# Patient Record
Sex: Female | Born: 1945
Health system: Southern US, Community
[De-identification: ages and names within clinical notes are randomized; demographics above are authoritative.]

## PROBLEM LIST (undated history)

## (undated) ENCOUNTER — Emergency Department (HOSPITAL_COMMUNITY): Admission: EM | Disposition: A | Discharge status: Still In Hospital | Payer: Medicare HMO | Source: Home / Self Care

## (undated) DIAGNOSIS — E785 Hyperlipidemia, unspecified: Secondary | ICD-10-CM

## (undated) DIAGNOSIS — J40 Bronchitis, not specified as acute or chronic: Secondary | ICD-10-CM

## (undated) DIAGNOSIS — E669 Obesity, unspecified: Secondary | ICD-10-CM

## (undated) DIAGNOSIS — G8929 Other chronic pain: Secondary | ICD-10-CM

## (undated) DIAGNOSIS — I10 Essential (primary) hypertension: Secondary | ICD-10-CM

## (undated) DIAGNOSIS — J302 Other seasonal allergic rhinitis: Secondary | ICD-10-CM

## (undated) DIAGNOSIS — M549 Dorsalgia, unspecified: Secondary | ICD-10-CM

## (undated) DIAGNOSIS — M1990 Unspecified osteoarthritis, unspecified site: Secondary | ICD-10-CM

## (undated) DIAGNOSIS — R7302 Impaired glucose tolerance (oral): Secondary | ICD-10-CM

## (undated) HISTORY — DX: Other chronic pain: G89.29

## (undated) HISTORY — DX: Essential (primary) hypertension: I10

## (undated) HISTORY — DX: Unspecified osteoarthritis, unspecified site: M19.90

## (undated) HISTORY — DX: Obesity, unspecified: E66.9

## (undated) HISTORY — DX: Hyperlipidemia, unspecified: E78.5

## (undated) HISTORY — PX: TOTAL ABDOMINAL HYSTERECTOMY: SHX209

## (undated) HISTORY — DX: Impaired glucose tolerance (oral): R73.02

## (undated) HISTORY — PX: KNEE ARTHROSCOPY: SUR90

## (undated) HISTORY — DX: Other chronic pain: M54.9

## (undated) HISTORY — PX: JOINT REPLACEMENT: SHX530

---

## 2000-11-30 ENCOUNTER — Emergency Department (HOSPITAL_COMMUNITY): Admission: EM | Admit: 2000-11-30 | Discharge: 2000-11-30 | Payer: Self-pay | Admitting: Internal Medicine

## 2000-11-30 ENCOUNTER — Encounter: Payer: Self-pay | Admitting: Internal Medicine

## 2001-07-26 ENCOUNTER — Encounter: Payer: Self-pay | Admitting: Emergency Medicine

## 2001-07-26 ENCOUNTER — Emergency Department (HOSPITAL_COMMUNITY): Admission: EM | Admit: 2001-07-26 | Discharge: 2001-07-26 | Payer: Self-pay | Admitting: Emergency Medicine

## 2002-07-11 ENCOUNTER — Encounter: Payer: Self-pay | Admitting: Emergency Medicine

## 2002-07-11 ENCOUNTER — Emergency Department (HOSPITAL_COMMUNITY): Admission: EM | Admit: 2002-07-11 | Discharge: 2002-07-11 | Payer: Self-pay | Admitting: Emergency Medicine

## 2002-10-12 ENCOUNTER — Emergency Department (HOSPITAL_COMMUNITY): Admission: EM | Admit: 2002-10-12 | Discharge: 2002-10-12 | Payer: Self-pay | Admitting: Emergency Medicine

## 2002-10-13 ENCOUNTER — Emergency Department (HOSPITAL_COMMUNITY): Admission: EM | Admit: 2002-10-13 | Discharge: 2002-10-13 | Payer: Self-pay | Admitting: Emergency Medicine

## 2002-11-02 ENCOUNTER — Encounter (HOSPITAL_COMMUNITY): Admission: RE | Admit: 2002-11-02 | Discharge: 2002-12-02 | Payer: Self-pay | Admitting: Orthopaedic Surgery

## 2003-11-29 ENCOUNTER — Emergency Department (HOSPITAL_COMMUNITY): Admission: EM | Admit: 2003-11-29 | Discharge: 2003-11-29 | Payer: Self-pay | Admitting: *Deleted

## 2003-12-21 ENCOUNTER — Encounter: Payer: Self-pay | Admitting: Orthopedic Surgery

## 2005-04-25 ENCOUNTER — Emergency Department (HOSPITAL_COMMUNITY): Admission: EM | Admit: 2005-04-25 | Discharge: 2005-04-25 | Payer: Self-pay | Admitting: Emergency Medicine

## 2005-04-28 ENCOUNTER — Emergency Department (HOSPITAL_COMMUNITY): Admission: EM | Admit: 2005-04-28 | Discharge: 2005-04-28 | Payer: Self-pay | Admitting: Emergency Medicine

## 2005-05-02 ENCOUNTER — Ambulatory Visit: Payer: Self-pay | Admitting: Orthopedic Surgery

## 2005-11-08 ENCOUNTER — Ambulatory Visit (HOSPITAL_COMMUNITY): Admission: RE | Admit: 2005-11-08 | Discharge: 2005-11-08 | Payer: Self-pay | Admitting: Internal Medicine

## 2006-01-24 ENCOUNTER — Emergency Department (HOSPITAL_COMMUNITY): Admission: EM | Admit: 2006-01-24 | Discharge: 2006-01-24 | Payer: Self-pay | Admitting: Emergency Medicine

## 2006-08-02 ENCOUNTER — Emergency Department (HOSPITAL_COMMUNITY): Admission: EM | Admit: 2006-08-02 | Discharge: 2006-08-02 | Payer: Self-pay | Admitting: Emergency Medicine

## 2006-08-04 ENCOUNTER — Emergency Department (HOSPITAL_COMMUNITY): Admission: EM | Admit: 2006-08-04 | Discharge: 2006-08-04 | Payer: Self-pay | Admitting: Emergency Medicine

## 2007-09-29 ENCOUNTER — Ambulatory Visit: Payer: Self-pay | Admitting: Family Medicine

## 2007-10-07 ENCOUNTER — Encounter: Payer: Self-pay | Admitting: Family Medicine

## 2007-10-07 DIAGNOSIS — I10 Essential (primary) hypertension: Secondary | ICD-10-CM | POA: Insufficient documentation

## 2007-10-07 DIAGNOSIS — N390 Urinary tract infection, site not specified: Secondary | ICD-10-CM | POA: Insufficient documentation

## 2007-10-07 LAB — CONVERTED CEMR LAB
BUN: 14 mg/dL (ref 6–23)
Basophils Absolute: 0 10*3/uL (ref 0.0–0.1)
Basophils Relative: 1 % (ref 0–1)
Creatinine, Ser: 0.62 mg/dL (ref 0.40–1.20)
Glucose, Bld: 100 mg/dL — ABNORMAL HIGH (ref 70–99)
HCT: 39 % (ref 36.0–46.0)
HDL: 59 mg/dL (ref >39)
Hemoglobin: 12.2 g/dL (ref 12.0–15.0)
Lymphocytes Relative: 38 % (ref 12–46)
MCHC: 31.3 g/dL (ref 30.0–36.0)
MCV: 87.2 fL (ref 78.0–100.0)
Monocytes Relative: 10 % (ref 3–12)
Neutrophils Relative %: 46 % (ref 43–77)
Platelets: 188 10*3/uL (ref 150–400)
Potassium: 3.6 meq/L (ref 3.5–5.3)
RDW: 14.6 % (ref 11.5–15.5)
TSH: 1.139 microintl units/mL (ref 0.350–5.50)
Vit D, 1,25-Dihydroxy: 4 — ABNORMAL LOW (ref 30–89)
WBC: 5.8 10*3/uL (ref 4.0–10.5)

## 2007-10-13 ENCOUNTER — Encounter: Payer: Self-pay | Admitting: Family Medicine

## 2007-10-13 LAB — CONVERTED CEMR LAB
ALT: 11 units/L (ref 0–35)
AST: 14 units/L (ref 0–37)
Albumin: 3.9 g/dL (ref 3.5–5.2)
Bilirubin, Direct: 0.1 mg/dL (ref 0.0–0.3)
Total Protein: 8 g/dL (ref 6.0–8.3)

## 2007-12-02 ENCOUNTER — Ambulatory Visit: Payer: Self-pay | Admitting: Family Medicine

## 2007-12-09 ENCOUNTER — Ambulatory Visit (HOSPITAL_COMMUNITY): Admission: RE | Admit: 2007-12-09 | Discharge: 2007-12-09 | Payer: Self-pay | Admitting: Family Medicine

## 2008-03-29 ENCOUNTER — Ambulatory Visit: Payer: Self-pay | Admitting: Family Medicine

## 2008-03-29 DIAGNOSIS — E662 Morbid (severe) obesity with alveolar hypoventilation: Secondary | ICD-10-CM | POA: Insufficient documentation

## 2008-03-29 DIAGNOSIS — E785 Hyperlipidemia, unspecified: Secondary | ICD-10-CM | POA: Insufficient documentation

## 2008-03-29 DIAGNOSIS — M1712 Unilateral primary osteoarthritis, left knee: Secondary | ICD-10-CM | POA: Insufficient documentation

## 2008-03-29 LAB — CONVERTED CEMR LAB
Bilirubin Urine: NEGATIVE
Glucose, Urine, Semiquant: NEGATIVE
Protein, U semiquant: NEGATIVE
Specific Gravity, Urine: 1.025
pH: 5.5

## 2008-03-31 ENCOUNTER — Encounter: Payer: Self-pay | Admitting: Family Medicine

## 2008-05-04 ENCOUNTER — Telehealth: Payer: Self-pay | Admitting: Family Medicine

## 2008-05-26 ENCOUNTER — Encounter: Payer: Self-pay | Admitting: Family Medicine

## 2008-06-13 ENCOUNTER — Ambulatory Visit: Payer: Self-pay | Admitting: Orthopedic Surgery

## 2008-08-01 ENCOUNTER — Ambulatory Visit: Payer: Self-pay | Admitting: Family Medicine

## 2008-09-05 ENCOUNTER — Encounter: Payer: Self-pay | Admitting: Family Medicine

## 2008-09-05 LAB — CONVERTED CEMR LAB
Albumin: 3.6 g/dL (ref 3.5–5.2)
BUN: 14 mg/dL (ref 6–23)
Calcium: 8.3 mg/dL — ABNORMAL LOW (ref 8.4–10.5)
Chloride: 98 meq/L (ref 96–112)
Creatinine, Ser: 0.58 mg/dL (ref 0.40–1.20)
Glucose, Bld: 98 mg/dL (ref 70–99)
LDL Cholesterol: 78 mg/dL (ref 0–99)
Potassium: 3.5 meq/L (ref 3.5–5.3)
Sodium: 143 meq/L (ref 135–145)
Total Bilirubin: 0.3 mg/dL (ref 0.3–1.2)

## 2008-09-20 ENCOUNTER — Ambulatory Visit: Payer: Self-pay | Admitting: Family Medicine

## 2008-09-20 DIAGNOSIS — IMO0002 Reserved for concepts with insufficient information to code with codable children: Secondary | ICD-10-CM | POA: Insufficient documentation

## 2008-09-27 ENCOUNTER — Ambulatory Visit (HOSPITAL_COMMUNITY): Admission: RE | Admit: 2008-09-27 | Discharge: 2008-09-27 | Payer: Self-pay | Admitting: Family Medicine

## 2008-09-27 ENCOUNTER — Encounter: Payer: Self-pay | Admitting: Orthopedic Surgery

## 2008-11-17 ENCOUNTER — Encounter: Admission: RE | Admit: 2008-11-17 | Discharge: 2008-11-17 | Payer: Self-pay | Admitting: Family Medicine

## 2008-12-01 ENCOUNTER — Ambulatory Visit: Payer: Self-pay | Admitting: Family Medicine

## 2008-12-02 LAB — CONVERTED CEMR LAB
BUN: 14 mg/dL (ref 6–23)
CO2: 27 meq/L (ref 19–32)
Creatinine, Ser: 0.61 mg/dL (ref 0.40–1.20)
Potassium: 4.3 meq/L (ref 3.5–5.3)

## 2008-12-14 ENCOUNTER — Telehealth: Payer: Self-pay | Admitting: Family Medicine

## 2008-12-15 ENCOUNTER — Ambulatory Visit: Payer: Self-pay | Admitting: Family Medicine

## 2008-12-29 ENCOUNTER — Encounter: Admission: RE | Admit: 2008-12-29 | Discharge: 2008-12-29 | Payer: Self-pay | Admitting: Family Medicine

## 2008-12-29 ENCOUNTER — Encounter: Payer: Self-pay | Admitting: Orthopedic Surgery

## 2008-12-30 ENCOUNTER — Ambulatory Visit (HOSPITAL_COMMUNITY): Admission: RE | Admit: 2008-12-30 | Discharge: 2008-12-30 | Payer: Self-pay | Admitting: Cardiology

## 2008-12-30 ENCOUNTER — Ambulatory Visit: Payer: Self-pay | Admitting: Cardiology

## 2008-12-30 ENCOUNTER — Encounter: Payer: Self-pay | Admitting: Cardiology

## 2009-01-03 ENCOUNTER — Encounter: Payer: Self-pay | Admitting: Cardiology

## 2009-03-16 ENCOUNTER — Telehealth: Payer: Self-pay | Admitting: Family Medicine

## 2009-05-27 HISTORY — PX: TOTAL KNEE ARTHROPLASTY: SHX125

## 2009-06-07 ENCOUNTER — Ambulatory Visit: Payer: Self-pay | Admitting: Family Medicine

## 2009-06-08 ENCOUNTER — Encounter: Payer: Self-pay | Admitting: Family Medicine

## 2009-06-11 ENCOUNTER — Telehealth: Payer: Self-pay | Admitting: Family Medicine

## 2009-06-12 ENCOUNTER — Telehealth: Payer: Self-pay | Admitting: Family Medicine

## 2009-06-12 ENCOUNTER — Encounter: Payer: Self-pay | Admitting: Family Medicine

## 2009-06-14 LAB — CONVERTED CEMR LAB
AST: 11 units/L (ref 0–37)
Alkaline Phosphatase: 77 units/L (ref 39–117)
Basophils Relative: 1 % (ref 0–1)
Bilirubin, Direct: 0.1 mg/dL (ref 0.0–0.3)
CO2: 26 meq/L (ref 19–32)
Calcium: 9.1 mg/dL (ref 8.4–10.5)
Chloride: 100 meq/L (ref 96–112)
Creatinine, Ser: 0.57 mg/dL (ref 0.40–1.20)
Eosinophils Relative: 4 % (ref 0–5)
HCT: 38.4 % (ref 36.0–46.0)
Hemoglobin: 12 g/dL (ref 12.0–15.0)
Indirect Bilirubin: 0.3 mg/dL (ref 0.0–0.9)
Lymphs Abs: 1.9 10*3/uL (ref 0.7–4.0)
MCHC: 31.3 g/dL (ref 30.0–36.0)
MCV: 86.7 fL (ref 78.0–100.0)
Monocytes Absolute: 0.7 10*3/uL (ref 0.1–1.0)
Monocytes Relative: 11 % (ref 3–12)
RBC: 4.43 M/uL (ref 3.87–5.11)
Sodium: 141 meq/L (ref 135–145)
Total Bilirubin: 0.4 mg/dL (ref 0.3–1.2)
Total Protein: 7.7 g/dL (ref 6.0–8.3)
VLDL: 14 mg/dL (ref 0–40)
WBC: 5.8 10*3/uL (ref 4.0–10.5)

## 2009-06-19 ENCOUNTER — Ambulatory Visit: Payer: Self-pay | Admitting: Orthopedic Surgery

## 2009-06-20 ENCOUNTER — Encounter (INDEPENDENT_AMBULATORY_CARE_PROVIDER_SITE_OTHER): Payer: Self-pay | Admitting: *Deleted

## 2009-06-26 ENCOUNTER — Encounter (INDEPENDENT_AMBULATORY_CARE_PROVIDER_SITE_OTHER): Payer: Self-pay | Admitting: *Deleted

## 2009-06-26 ENCOUNTER — Ambulatory Visit (HOSPITAL_COMMUNITY): Admission: RE | Admit: 2009-06-26 | Discharge: 2009-06-26 | Payer: Self-pay | Admitting: Family Medicine

## 2009-06-30 ENCOUNTER — Encounter (INDEPENDENT_AMBULATORY_CARE_PROVIDER_SITE_OTHER): Payer: Self-pay | Admitting: *Deleted

## 2009-07-07 ENCOUNTER — Encounter: Payer: Self-pay | Admitting: Orthopedic Surgery

## 2009-07-11 ENCOUNTER — Inpatient Hospital Stay (HOSPITAL_COMMUNITY): Admission: RE | Admit: 2009-07-11 | Discharge: 2009-07-14 | Payer: Self-pay | Admitting: Orthopedic Surgery

## 2009-07-11 ENCOUNTER — Ambulatory Visit: Payer: Self-pay | Admitting: Orthopedic Surgery

## 2009-07-14 ENCOUNTER — Inpatient Hospital Stay: Admission: AD | Admit: 2009-07-14 | Discharge: 2009-08-12 | Payer: Self-pay | Admitting: Internal Medicine

## 2009-07-20 ENCOUNTER — Encounter: Payer: Self-pay | Admitting: Orthopedic Surgery

## 2009-07-25 ENCOUNTER — Ambulatory Visit: Payer: Self-pay | Admitting: Orthopedic Surgery

## 2009-07-28 ENCOUNTER — Ambulatory Visit (HOSPITAL_COMMUNITY): Admission: RE | Admit: 2009-07-28 | Discharge: 2009-07-28 | Payer: Self-pay | Admitting: Internal Medicine

## 2009-08-14 ENCOUNTER — Encounter: Payer: Self-pay | Admitting: Orthopedic Surgery

## 2009-08-23 ENCOUNTER — Ambulatory Visit: Payer: Self-pay | Admitting: Orthopedic Surgery

## 2009-08-24 ENCOUNTER — Encounter: Payer: Self-pay | Admitting: Orthopedic Surgery

## 2009-08-25 ENCOUNTER — Ambulatory Visit: Payer: Self-pay | Admitting: Family Medicine

## 2009-08-30 ENCOUNTER — Telehealth: Payer: Self-pay | Admitting: Family Medicine

## 2009-08-31 ENCOUNTER — Encounter (HOSPITAL_COMMUNITY): Admission: RE | Admit: 2009-08-31 | Discharge: 2009-09-30 | Payer: Self-pay | Admitting: Orthopedic Surgery

## 2009-08-31 ENCOUNTER — Encounter: Payer: Self-pay | Admitting: Orthopedic Surgery

## 2009-09-07 ENCOUNTER — Encounter: Payer: Self-pay | Admitting: Orthopedic Surgery

## 2009-09-27 ENCOUNTER — Encounter: Payer: Self-pay | Admitting: Orthopedic Surgery

## 2009-10-02 ENCOUNTER — Encounter (HOSPITAL_COMMUNITY): Admission: RE | Admit: 2009-10-02 | Discharge: 2009-11-01 | Payer: Self-pay | Admitting: Orthopedic Surgery

## 2009-10-06 ENCOUNTER — Encounter: Payer: Self-pay | Admitting: Orthopedic Surgery

## 2009-10-09 ENCOUNTER — Ambulatory Visit: Payer: Self-pay | Admitting: Orthopedic Surgery

## 2009-10-12 ENCOUNTER — Encounter: Payer: Self-pay | Admitting: Orthopedic Surgery

## 2009-10-20 ENCOUNTER — Encounter: Payer: Self-pay | Admitting: Orthopedic Surgery

## 2009-10-25 ENCOUNTER — Encounter: Payer: Self-pay | Admitting: Orthopedic Surgery

## 2009-10-26 ENCOUNTER — Telehealth: Payer: Self-pay | Admitting: Family Medicine

## 2009-10-30 ENCOUNTER — Telehealth: Payer: Self-pay | Admitting: Family Medicine

## 2009-12-25 ENCOUNTER — Ambulatory Visit: Payer: Self-pay | Admitting: Family Medicine

## 2010-01-02 ENCOUNTER — Telehealth: Payer: Self-pay | Admitting: Family Medicine

## 2010-01-17 ENCOUNTER — Ambulatory Visit: Payer: Self-pay | Admitting: Orthopedic Surgery

## 2010-01-24 ENCOUNTER — Encounter: Payer: Self-pay | Admitting: Orthopedic Surgery

## 2010-02-20 ENCOUNTER — Encounter: Payer: Self-pay | Admitting: Orthopedic Surgery

## 2010-02-26 ENCOUNTER — Telehealth: Payer: Self-pay | Admitting: Orthopedic Surgery

## 2010-03-02 ENCOUNTER — Emergency Department (HOSPITAL_COMMUNITY): Admission: EM | Admit: 2010-03-02 | Discharge: 2010-03-02 | Payer: Self-pay | Admitting: Emergency Medicine

## 2010-03-13 ENCOUNTER — Ambulatory Visit: Payer: Self-pay | Admitting: Family Medicine

## 2010-03-15 ENCOUNTER — Telehealth (INDEPENDENT_AMBULATORY_CARE_PROVIDER_SITE_OTHER): Payer: Self-pay | Admitting: *Deleted

## 2010-03-19 ENCOUNTER — Encounter: Payer: Self-pay | Admitting: Orthopedic Surgery

## 2010-03-30 ENCOUNTER — Encounter: Payer: Self-pay | Admitting: Family Medicine

## 2010-04-06 ENCOUNTER — Encounter: Payer: Self-pay | Admitting: Orthopedic Surgery

## 2010-06-17 ENCOUNTER — Encounter: Payer: Self-pay | Admitting: Family Medicine

## 2010-06-18 ENCOUNTER — Encounter: Payer: Self-pay | Admitting: Family Medicine

## 2010-06-22 LAB — CONVERTED CEMR LAB
AST: 16 units/L (ref 0–37)
Albumin: 3.9 g/dL (ref 3.5–5.2)
Bilirubin, Direct: 0.1 mg/dL (ref 0.0–0.3)
Calcium: 9.5 mg/dL (ref 8.4–10.5)
Chloride: 100 meq/L (ref 96–112)
HDL: 57 mg/dL (ref >39)
LDL Cholesterol: 116 mg/dL — ABNORMAL HIGH (ref 0–99)
Potassium: 4 meq/L (ref 3.5–5.3)
Total Bilirubin: 0.3 mg/dL (ref 0.3–1.2)
Total CHOL/HDL Ratio: 3.3
Triglycerides: 66 mg/dL (ref <150)
VLDL: 13 mg/dL (ref 0–40)

## 2010-06-25 ENCOUNTER — Ambulatory Visit
Admission: RE | Admit: 2010-06-25 | Discharge: 2010-06-25 | Payer: Self-pay | Source: Home / Self Care | Attending: Family Medicine | Admitting: Family Medicine

## 2010-06-25 LAB — CONVERTED CEMR LAB
Glucose, Urine, Semiquant: NEGATIVE
Ketones, urine, test strip: NEGATIVE
Nitrite: NEGATIVE
Specific Gravity, Urine: 1.02
Urobilinogen, UA: 0.2

## 2010-06-26 ENCOUNTER — Encounter: Payer: Self-pay | Admitting: Family Medicine

## 2010-06-26 NOTE — Progress Notes (Signed)
Summary: call  Phone Note Call from Patient   Summary of Call: wants you to call her at [phone] or home [phone] Initial call taken by: Lind Guest,  June 12, 2009 8:41 AM  Follow-up for Phone Call        called pt back and told her when dr. Diamantina Providence office gave her appt and time to call and let me know.  Follow-up by: Rudene Anda,  June 12, 2009 9:20 AM

## 2010-06-26 NOTE — Letter (Signed)
Summary: Out of Work  [facility]  [address]   [place], [place] [address]   Phone: [phone]  Fax: [phone]    June 07, 2009   Employee:  Patricia Fuentes    To Whom It May Concern:   For Medical reasons, please excuse the above named employee from work for the following dates:  Start:   06/05/09  End:   until follow up with orthopedist  If you need additional information, please feel free to contact our office.         Sincerely,    Milus Mallick. Simpson,MD

## 2010-06-26 NOTE — Letter (Signed)
Summary: Work [facility]  [address]  [place], [place] [address]   Phone: [phone]  Fax: [phone]      Today's Date: June 19, 2009  Name of Patient: Patricia Fuentes  The above named patient had a medical visit today at:  9:15am   Please take this into consideration when reviewing the time away from work.    Special Instructions:  Out of work start date:  June 19, 2009  [ X ] To continue out of work status for the next 3 to 4 months, or until further notice.  Estimate out of work through Oct 16, 2009 (secondary to surgery)_.  [  ] Other ________________________________________________________________ ________________________________________________________________________   Sincerely yours,   Terrance Mass,  MD

## 2010-06-26 NOTE — Miscellaneous (Signed)
Summary: PT clinical evaluation  PT clinical evaluation   Imported By: Jacklynn Ganong 09/12/2009 09:10:00  _____________________________________________________________________  External Attachment:    Type:   Image     Comment:   External Document

## 2010-06-26 NOTE — Miscellaneous (Signed)
Summary: PT progress note  PT progress note   Imported By: Jacklynn Ganong 10/10/2009 15:08:19  _____________________________________________________________________  External Attachment:    Type:   Image     Comment:   External Document

## 2010-06-26 NOTE — Miscellaneous (Signed)
Summary: refill  Clinical Lists Changes  Medications: Rx of DIOVAN HCT 320-25 MG TABS (VALSARTAN-HYDROCHLOROTHIAZIDE) Take 1 tablet by mouth once a day;  #30 x 5;  Signed;  Entered by: Worthy Keeler LPN;  Authorized by: Syliva Overman MD;  Method used: Electronically to [facility]*, [address], [place], [place], [place]  [address], Ph: [phone], Fax: [phone]    Prescriptions: DIOVAN HCT 320-25 MG TABS (VALSARTAN-HYDROCHLOROTHIAZIDE) Take 1 tablet by mouth once a day  #30 x 5   Entered by:   Worthy Keeler LPN   Authorized by:   Syliva Overman MD   Signed by:   Worthy Keeler LPN on 96/29/5284   Method used:   Electronically to        [facility]* (retail)       [address] [place], [place]  [address]       Ph: [phone]       Fax: [phone]   RxID:   [id number]

## 2010-06-26 NOTE — Miscellaneous (Signed)
Summary: faxed notes for cpm and home therapy  Clinical Lists Changes  faxed info to [facility] for [facility] and to [facility] for therapy

## 2010-06-26 NOTE — Assessment & Plan Note (Signed)
Summary: f up from [place]   Vital Signs:  Patient profile:   64 year old female Menstrual status:  hysterectomy Height:      62 inches Weight:      250.25 pounds BMI:     45.94 O2 Sat:      93 % Pulse rate:   83 / minute Pulse rhythm:   regular Resp:     16 per minute BP sitting:   120 / 78  (left arm) Cuff size:   large  Vitals Entered By: Everitt Amber LPN (August 26, 4399 9:50 AM)  Nutrition Counseling: Patient's BMI is greater than 25 and therefore counseled on weight management options.  Primary Care Provider:  Syliva Overman MD   History of Present Illness: Reports  thatshe has been doing well. She has recovered well from hewr knee replacement, she was in a rehab center for 1 month. She denies pain and reports improved mobility. she is c/o increased med costs, she does have amlodipine as a new drug but i reassure her that this is available at a generic price aND SHE WILL LOOK INTO IT. Denies recent fever or chills. Denies sinus pressure, nasal congestion , ear pain or sore throat. Denies chest congestion, or cough productive of sputum. Denies chest pain, palpitations, PND, orthopnea or leg swelling. Denies abdominal pain, nausea, vomitting, diarrhea or constipation. Denies change in bowel movements or bloody stool. Denies dysuria , frequency, incontinence or hesitancy. Denies  joint pain, swelling, or reduced mobility. Denies headaches, vertigo, seizures. Denies depression, anxiety or insomnia. Denies  rash, lesions, or itch.     Current Medications (verified): 1)  Bayer Aspirin 325 Mg  Tabs (Aspirin) .... One Tab By Mouth Once Daily 2)  Oscal 500/200 D-3 500-200 Mg-Unit  Tabs (Calcium-Vitamin D) .... One Tab By Mouth Two Times A Day 3)  Norvasc 5 Mg Tabs (Amlodipine Besylate) .... Take 1 Tab By Mouth At Bedtime 4)  Diovan Hct 320-25 Mg Tabs (Valsartan-Hydrochlorothiazide) .... Take 1 Tablet By Mouth Once A Day 5)  Klor-Con M20 20 Meq Cr-Tabs (Potassium Chloride Crys  Cr) .... Take 1 Tablet By Mouth Once A Day 6)  Hydrocodone-Acetaminophen 5-500 Mg Tabs (Hydrocodone-Acetaminophen) .... One Tab Q 4 Hrs As Needed 7)  Docusate Sodium 100 Mg Caps (Docusate Sodium) .... Take 1 Tablet By Mouth Two Times A Day 8)  Lovastatin 40 Mg Tabs (Lovastatin) .... Take 1 Tab By Mouth At Bedtime 9)  Furosemide 40 Mg Tabs (Furosemide) .... Take 1 Tablet By Mouth Once A Day  Allergies (verified): 1)  ! Phenergan  Past History:  Past medical, surgical, family and social histories (including risk factors) reviewed, and no changes noted (except as noted below).  Past Medical History: Reviewed history from 08/01/2008 and no changes required. Current Problems:  UTI (ICD-599.0) HYPERTENSION (ICD-401.9) hyperlipidemia  Past Surgical History: TAH Arthroscopy left knee hysterectomy knee rplacement right 02'/15/2011  Dr Samuella Cota, recovered at [facility] center for 1 month  Family History: Reviewed history from 10/07/2007 and no changes required. Mom deceased stroke and HTN Father deceased had DM<Stroke and heart failure Brothers 3 living two of whom are HTN, Sister 1 living healthy  Social History: Reviewed history from 10/07/2007 and no changes required. Employed Married Four children Never Smoked Alcohol use-no Drug use-no  Review of Systems      See HPI Eyes:  Denies blurring, discharge, eye pain, and red eye. Endo:  Denies cold intolerance, excessive hunger, excessive thirst, excessive urination, heat intolerance, polyuria, and  weight change. Heme:  Denies abnormal bruising and bleeding. Allergy:  Denies hives or rash and itching eyes.  Physical Exam  General:  Well-developed,obese,in no acute distress; alert,appropriate and cooperative throughout examination HEENT: No facial asymmetry,  EOMI, No sinus tenderness, TM's Clear, oropharynx  pink and moist.   Chest: Clear to auscultation bilaterally.  CVS: S1, S2, No murmurs, No S3.   Abd: Soft, Nontender.    MS: Adequate ROM spine, hips, shoulders and knees. Swelling of right knee. Ext: No edema.   CNS: CN 2-12 intact, power tone and sensation normal throughout.   Skin: Intact, no visible lesions or rashes. Incision site on right knee well healed. Psych: Good eye contact, normal affect.  Memory intact, not anxious or depressed appearing.    Impression & Recommendations:  Problem # 1:  KNEE PAIN, RIGHT (ICD-719.46) Assessment Improved  The following medications were removed from the medication list:    Ibuprofen 800 Mg Tabs (Ibuprofen) .Marland Kitchen... Take 1 tablet by mouth two times a day as needed Her updated medication list for this problem includes:    Bayer Aspirin 325 Mg Tabs (Aspirin) ..... One tab by mouth once daily    Hydrocodone-acetaminophen 5-500 Mg Tabs (Hydrocodone-acetaminophen) ..... One tab q 4 hrs as needed pt has succesfully had knee replacement  Problem # 2:  HYPERLIPIDEMIA (ICD-272.4) Assessment: Comment Only  The following medications were removed from the medication list:    Simvastatin 20 Mg Tabs (Simvastatin) .Marland Kitchen... Take 1 tab by mouth at bedtime Her updated medication list for this problem includes:    Lovastatin 40 Mg Tabs (Lovastatin) .Marland Kitchen... Take 1 tab by mouth at bedtime  Orders: T-Hepatic Function [phone]) T-Lipid Profile [phone])  Labs Reviewed: SGOT: 11 (06/14/2009)   SGPT: <8 U/L (06/14/2009)   HDL:53 (06/14/2009), 51 (09/05/2008)  LDL:104 (06/14/2009), 78 (29/56/2130)  Chol:171 (06/14/2009), 142 (09/05/2008)  Trig:69 (06/14/2009), 65 (09/05/2008) low fat diet discussed and encouraged  Problem # 3:  OBESITY, UNSPECIFIED (ICD-278.00) Assessment: Unchanged  Ht: 62 (08/25/2009)   Wt: 250.25 (08/25/2009)   BMI: 45.94 (08/25/2009)  Problem # 4:  HYPERTENSION (ICD-401.9) Assessment: Improved  The following medications were removed from the medication list:    Lasix 40 Mg Tabs (Furosemide) .Marland Kitchen... Take 1 tablet by mouth two times a day Her updated  medication list for this problem includes:    Norvasc 5 Mg Tabs (Amlodipine besylate) .Marland Kitchen... Take 1 tab by mouth at bedtime    Diovan Hct 320-25 Mg Tabs (Valsartan-hydrochlorothiazide) .Marland Kitchen... Take 1 tablet by mouth once a day    Furosemide 40 Mg Tabs (Furosemide) .Marland Kitchen... Take 1 tablet by mouth once a day  Orders: T-Basic Metabolic Panel [phone])  BP today: 120/78 Prior BP: 130/80 (06/07/2009)  Labs Reviewed: K+: 3.8 (06/14/2009) Creat: : 0.57 (06/14/2009)   Chol: 171 (06/14/2009)   HDL: 53 (06/14/2009)   LDL: 104 (06/14/2009)   TG: 69 (06/14/2009)  Complete Medication List: 1)  Bayer Aspirin 325 Mg Tabs (Aspirin) .... One tab by mouth once daily 2)  Oscal 500/200 D-3 500-200 Mg-unit Tabs (Calcium-vitamin d) .... One tab by mouth two times a day 3)  Norvasc 5 Mg Tabs (Amlodipine besylate) .... Take 1 tab by mouth at bedtime 4)  Diovan Hct 320-25 Mg Tabs (Valsartan-hydrochlorothiazide) .... Take 1 tablet by mouth once a day 5)  Klor-con M20 20 Meq Cr-tabs (Potassium chloride crys cr) .... Take 1 tablet by mouth once a day 6)  Hydrocodone-acetaminophen 5-500 Mg Tabs (Hydrocodone-acetaminophen) .... One tab q 4 hrs as  needed 7)  Docusate Sodium 100 Mg Caps (Docusate sodium) .... Take 1 tablet by mouth two times a day 8)  Lovastatin 40 Mg Tabs (Lovastatin) .... Take 1 tab by mouth at bedtime 9)  Furosemide 40 Mg Tabs (Furosemide) .... Take 1 tablet by mouth once a day  Patient Instructions: 1)  Please schedule a follow-up appointment in 4 months. 2)  It is important that you exercise regularly at least 20 minutes 5 times a week. If you develop chest pain, have severe difficulty breathing, or feel very tired , stop exercising immediately and seek medical attention. 3)  You need to lose weight. Consider a lower calorie diet and regular exercise.  4)  BMP prior to visit, ICD-9: 5)  Hepatic Panel prior to visit, ICD-9:  fasting in 4 months 6)  Lipid Panel prior to visit,  ICD-9: Prescriptions: LOVASTATIN 40 MG TABS (LOVASTATIN) Take 1 tab by mouth at bedtime  #90 x 1   Entered by:   Everitt Amber LPN   Authorized by:   Syliva Overman MD   Signed by:   Everitt Amber LPN on 56/38/7564   Method used:   Handwritten   RxID:   [id number] KLOR-CON M20 20 MEQ CR-TABS (POTASSIUM CHLORIDE CRYS CR) Take 1 tablet by mouth once a day  #90 x 1   Entered by:   Everitt Amber LPN   Authorized by:   Syliva Overman MD   Signed by:   Everitt Amber LPN on 16/05/930   Method used:   Handwritten   RxID:   [id number] NORVASC 5 MG TABS (AMLODIPINE BESYLATE) Take 1 tab by mouth at bedtime  #90 x 4   Entered by:   Everitt Amber LPN   Authorized by:   Syliva Overman MD   Signed by:   Everitt Amber LPN on 37/62/8315   Method used:   Handwritten   RxID:   [id number] FUROSEMIDE 40 MG TABS (FUROSEMIDE) Take 1 tablet by mouth once a day  #90 x 0   Entered and Authorized by:   Syliva Overman MD   Signed by:   Syliva Overman MD on 08/25/2009   Method used:   Historical   RxID:   [id number] LOVASTATIN 40 MG TABS (LOVASTATIN) Take 1 tab by mouth at bedtime  #90 x 0   Entered and Authorized by:   Syliva Overman MD   Signed by:   Syliva Overman MD on 08/25/2009   Method used:   Historical   RxID:   [id number]

## 2010-06-26 NOTE — Letter (Signed)
Summary: fmla papers  fmla papers   Imported By: Lind Guest 06/12/2009 08:47:19  _____________________________________________________________________  External Attachment:    Type:   Image     Comment:   External Document

## 2010-06-26 NOTE — Letter (Signed)
Summary: FMLA form  FMLA form   Imported By: Cammie Sickle 09/06/2009 09:28:16  _____________________________________________________________________  External Attachment:    Type:   Image     Comment:   External Document

## 2010-06-26 NOTE — Letter (Signed)
Summary: handicapp card  handicapp card   Imported By: Lind Guest 06/08/2009 08:53:29  _____________________________________________________________________  External Attachment:    Type:   Image     Comment:   External Document

## 2010-06-26 NOTE — Miscellaneous (Signed)
Summary: PT progress note  PT progress note   Imported By: Jacklynn Ganong 10/27/2009 09:56:59  _____________________________________________________________________  External Attachment:    Type:   Image     Comment:   External Document

## 2010-06-26 NOTE — Miscellaneous (Signed)
Summary: PT Progress note  PT Progress note   Imported By: Jacklynn Ganong 10/04/2009 15:33:53  _____________________________________________________________________  External Attachment:    Type:   Image     Comment:   External Document

## 2010-06-26 NOTE — Assessment & Plan Note (Signed)
Summary: BACK   Vital Signs:  Patient profile:   65 year old female Menstrual status:  hysterectomy Height:      62 inches Weight:      262.25 pounds BMI:     48.14 O2 Sat:      97 % on Room air Pulse rate:   74 / minute Pulse rhythm:   regular Resp:     16 per minute BP sitting:   106 / 60  (left arm)  Vitals Entered By: Adella Hare LPN (March 13, 2010 4:05 PM)  Nutrition Counseling: Patient's BMI is greater than 25 and therefore counseled on weight management options.  O2 Flow:  Room air CC: right sided lower back pain Is Patient Diabetic? No   Primary Care Provider:  Syliva Overman MD  CC:  right sided lower back pain.  History of Present Illness: 1 week h/o LBP radiating down right posterior thigh,. no other  symptoms, thionks excessive standing and walking aggravated it Reports  that she had been doing well prior to this Denies recent fever or chills. Denies sinus pressure, nasal congestion , ear pain or sore throat. Denies chest congestion, or cough productive of sputum. Denies chest pain, palpitations, PND, orthopnea or leg swelling. Denies abdominal pain, nausea, vomitting, diarrhea or constipation. Denies change in bowel movements or bloody stool. Denies dysuria , frequency, incontinence or hesitancy.  Denies headaches, vertigo, seizures. Denies depression, anxiety or insomnia. Denies  rash, lesions, or itch.     Current Medications (verified): 1)  Bayer Aspirin 325 Mg  Tabs (Aspirin) .... One Tab By Mouth Once Daily 2)  Oscal 500/200 D-3 500-200 Mg-Unit  Tabs (Calcium-Vitamin D) .... One Tab By Mouth Two Times A Day 3)  Norvasc 5 Mg Tabs (Amlodipine Besylate) .... Take 1 Tab By Mouth At Bedtime 4)  Klor-Con M20 20 Meq Cr-Tabs (Potassium Chloride Crys Cr) .... Take 1 Tablet By Mouth Once A Day 5)  Hydrocodone-Acetaminophen 5-500 Mg Tabs (Hydrocodone-Acetaminophen) .... One Tab Q 4 Hrs As Needed 6)  Lovastatin 40 Mg Tabs (Lovastatin) .... Take 1 Tab By  Mouth At Bedtime 7)  Furosemide 40 Mg Tabs (Furosemide) .... Take 1 Tablet By Mouth Once A Day 8)  Nabumetone 500 Mg Tabs (Nabumetone) .Marland Kitchen.. 1 By Mouth Two Times A Day 9)  Triamterene-Hctz 37.5-25 Mg Tabs (Triamterene-Hctz) .... Take 1 Tablet By Mouth Once A Day 10)  Methocarbamol 500 Mg Tabs (Methocarbamol) .Marland Kitchen.. 1 By Mouth Every 6 Hours As Needed Spasms  Allergies (verified): 1)  ! Phenergan  Review of Systems      See HPI General:  Complains of fatigue. MS:  Complains of low back pain and mid back pain. Heme:  Denies abnormal bruising and bleeding.  Physical Exam  General:  Well-developed,obese,in no acute distress; alert,appropriate and cooperative throughout examination HEENT: No facial asymmetry,  EOMI, No sinus tenderness, TM's Clear, oropharynx  pink and moist.   Chest: Clear to auscultation bilaterally.  CVS: S1, S2, No murmurs, No S3.   Abd: Soft, Nontender.  MS: decreased  ROM spine, hips,  and knees.  Ext: No edema.   CNS: CN 2-12 intact, power tone and sensation normal throughout.   Skin: Intact, no visible lesions or rashes.  Psych: Good eye contact, normal affect.  Memory intact, not anxious or depressed appearing.    Impression & Recommendations:  Problem # 1:  BACK PAIN WITH RADICULOPATHY (ICD-729.2) Assessment Deteriorated  Orders: Depo- Medrol 80mg  (J1040) Ketorolac-Toradol 15mg  (Z6109) Admin of Therapeutic Inj  intramuscular or subcutaneous (16109)  Problem # 2:  OBESITY, UNSPECIFIED (ICD-278.00) Assessment: Deteriorated  Ht: 62 (03/13/2010)   Wt: 262.25 (03/13/2010)   BMI: 48.14 (03/13/2010) therapeutic lifestyle change discussed and encouraged  Problem # 3:  HYPERTENSION (ICD-401.9) Assessment: Unchanged  Her updated medication list for this problem includes:    Norvasc 5 Mg Tabs (Amlodipine besylate) .Marland Kitchen... Take 1 tab by mouth at bedtime    Furosemide 40 Mg Tabs (Furosemide) .Marland Kitchen... Take 1 tablet by mouth once a day    Triamterene-hctz 37.5-25 Mg  Tabs (Triamterene-hctz) .Marland Kitchen... Take 1 tablet by mouth once a day  BP today: 106/60 Prior BP: 134/80 (12/25/2009)  Labs Reviewed: K+: 3.8 (06/14/2009) Creat: : 0.57 (06/14/2009)   Chol: 171 (06/14/2009)   HDL: 53 (06/14/2009)   LDL: 104 (06/14/2009)   TG: 69 (06/14/2009)  Complete Medication List: 1)  Bayer Aspirin 325 Mg Tabs (Aspirin) .... One tab by mouth once daily 2)  Oscal 500/200 D-3 500-200 Mg-unit Tabs (Calcium-vitamin d) .... One tab by mouth two times a day 3)  Norvasc 5 Mg Tabs (Amlodipine besylate) .... Take 1 tab by mouth at bedtime 4)  Klor-con M20 20 Meq Cr-tabs (Potassium chloride crys cr) .... Take 1 tablet by mouth once a day 5)  Hydrocodone-acetaminophen 5-500 Mg Tabs (Hydrocodone-acetaminophen) .... One tab q 4 hrs as needed 6)  Lovastatin 40 Mg Tabs (Lovastatin) .... Take 1 tab by mouth at bedtime 7)  Furosemide 40 Mg Tabs (Furosemide) .... Take 1 tablet by mouth once a day 8)  Nabumetone 500 Mg Tabs (Nabumetone) .Marland Kitchen.. 1 by mouth two times a day 9)  Triamterene-hctz 37.5-25 Mg Tabs (Triamterene-hctz) .... Take 1 tablet by mouth once a day 10)  Methocarbamol 500 Mg Tabs (Methocarbamol) .Marland Kitchen.. 1 by mouth every 6 hours as needed spasms 11)  Prednisone (pak) 5 Mg Tabs (Prednisone) .... Use as directed  Patient Instructions: 1)  F/U as before 2)  You will get 2 injections for back pain. 3)  Prednisone is sent in also pls start today. 4)  Start ibuprofen 800mg  one 3 times daily for 1 week tomorrow. Prescriptions: PREDNISONE (PAK) 5 MG TABS (PREDNISONE) Use as directed  #21 x 0   Entered and Authorized by:   Syliva Overman MD   Signed by:   Syliva Overman MD on 03/13/2010   Method used:   Electronically to        [facility] (retail)       [address]       [place], [place]  [address]       Ph: [phone]       Fax: [phone]   RxID:   [id number]    Medication Administration  Injection # 1:    Medication: Depo-  Medrol 80mg     Diagnosis: BACK PAIN WITH RADICULOPATHY (ICD-729.2)    Route: IM    Site: RUOQ gluteus    Exp Date: 06/12    Lot #: [id number]    Mfr: Pharmacia    Patient tolerated injection without complications    Given by: Adella Hare LPN (March 13, 2010 4:45 PM)  Injection # 2:    Medication: Ketorolac-Toradol 15mg     Diagnosis: BACK PAIN WITH RADICULOPATHY (ICD-729.2)    Route: IM    Site: LUOQ gluteus    Exp Date: 03/28/2011    Lot #: [id number]    Mfr: [email or web address]    Comments: toradol 60mg  given  Patient tolerated injection without complications    Given by: Adella Hare LPN (March 13, 2010 4:46 PM)  Orders Added: 1)  Est. Patient Level III [99213] 2)  Depo- Medrol 80mg  [J1040] 3)  Ketorolac-Toradol 15mg  [J1885] 4)  Admin of Therapeutic Inj  intramuscular or subcutaneous [96372]     Medication Administration  Injection # 1:    Medication: Depo- Medrol 80mg     Diagnosis: BACK PAIN WITH RADICULOPATHY (ICD-729.2)    Route: IM    Site: RUOQ gluteus    Exp Date: 06/12    Lot #: [id number]    Mfr: [employer]    Patient tolerated injection without complications    Given by: Adella Hare LPN (March 13, 2010 4:45 PM)  Injection # 2:    Medication: Ketorolac-Toradol 15mg     Diagnosis: BACK PAIN WITH RADICULOPATHY (ICD-729.2)    Route: IM    Site: LUOQ gluteus    Exp Date: 03/28/2011    Lot #: [id number]    Mfr: novaplus    Comments: toradol 60mg  given    Patient tolerated injection without complications    Given by: Adella Hare LPN (March 13, 2010 4:46 PM)  Orders Added: 1)  Est. Patient Level III [33295] 2)  Depo- Medrol 80mg  [J1040] 3)  Ketorolac-Toradol 15mg  [J1885] 4)  Admin of Therapeutic Inj  intramuscular or subcutaneous [18841]

## 2010-06-26 NOTE — Assessment & Plan Note (Signed)
Summary: F UP   Vital Signs:  Patient profile:   65 year old female Menstrual status:  hysterectomy Height:      62 inches Weight:      253.75 pounds BMI:     46.58 O2 Sat:      94 % Pulse rate:   80 / minute Pulse rhythm:   regular Resp:     16 per minute BP sitting:   134 / 80  (left arm)  Vitals Entered By: Everitt Amber LPN (December 25, 2009 4:13 PM)  Nutrition Counseling: Patient's BMI is greater than 25 and therefore counseled on weight management options. CC: Follow up chronic problems   Primary Care Provider:  Syliva Overman MD  CC:  Follow up chronic problems.  History of Present Illness: Reports  that she is  doing well. she has now retired, and following the surgery on her knee, she reports marked improvement in her pain. Denies recent fever or chills. Denies sinus pressure, nasal congestion , ear pain or sore throat. Denies chest congestion, or cough productive of sputum. Denies chest pain, palpitations, PND, orthopnea or leg swelling. Denies abdominal pain, nausea, vomitting, diarrhea or constipation. Denies change in bowel movements or bloody stool. Denies dysuria , frequency, incontinence or hesitancy.  Denies headaches, vertigo, seizures. Denies depression, anxiety or insomnia. Denies  rash, lesions, or itch.     Current Medications (verified): 1)  Bayer Aspirin 325 Mg  Tabs (Aspirin) .... One Tab By Mouth Once Daily 2)  Oscal 500/200 D-3 500-200 Mg-Unit  Tabs (Calcium-Vitamin D) .... One Tab By Mouth Two Times A Day 3)  Norvasc 5 Mg Tabs (Amlodipine Besylate) .... Take 1 Tab By Mouth At Bedtime 4)  Klor-Con M20 20 Meq Cr-Tabs (Potassium Chloride Crys Cr) .... Take 1 Tablet By Mouth Once A Day 5)  Hydrocodone-Acetaminophen 5-500 Mg Tabs (Hydrocodone-Acetaminophen) .... One Tab Q 4 Hrs As Needed 6)  Docusate Sodium 100 Mg Caps (Docusate Sodium) .... Take 1 Tablet By Mouth Two Times A Day 7)  Lovastatin 40 Mg Tabs (Lovastatin) .... Take 1 Tab By Mouth At  Bedtime 8)  Furosemide 40 Mg Tabs (Furosemide) .... Take 1 Tablet By Mouth Once A Day 9)  Nabumetone 500 Mg Tabs (Nabumetone) .Marland Kitchen.. 1 P O Two Times A Day 10)  Maxzide-25 37.5-25 Mg Tabs (Triamterene-Hctz) .... One Tab By Mouth Once Daily  Allergies (verified): 1)  ! Phenergan  Past History:  Past medical, surgical, family and social histories (including risk factors) reviewed, and no changes noted (except as noted below).  Past Medical History: Reviewed history from 08/01/2008 and no changes required. Current Problems:  UTI (ICD-599.0) HYPERTENSION (ICD-401.9) hyperlipidemia  Past Surgical History: Reviewed history from 08/25/2009 and no changes required. TAH Arthroscopy left knee hysterectomy knee rplacement right 02'/15/2011  Dr Samuella Cota, recovered at [facility] center for 1 month  Family History: Reviewed history from 10/07/2007 and no changes required. Mom deceased stroke and HTN Father deceased had DM<Stroke and heart failure Brothers 3 living two of whom are HTN, Sister 1 living healthy  Social History: Reviewed history from 10/07/2007 and no changes required. retired may 2011 Married Four children Never Smoked Alcohol use-no Drug use-no  Review of Systems      See HPI General:  Complains of fatigue. Eyes:  Denies blurring and discharge. MS:  Complains of joint pain and stiffness; knee pain and stiffness, markedly improved. Endo:  Denies cold intolerance, excessive hunger, excessive thirst, excessive urination, heat intolerance, polyuria, and weight change.  Heme:  Denies abnormal bruising and bleeding. Allergy:  Complains of seasonal allergies.  Physical Exam  General:  Well-developed,obese,in no acute distress; alert,appropriate and cooperative throughout examination HEENT: No facial asymmetry,  EOMI, No sinus tenderness, TM's Clear, oropharynx  pink and moist.   Chest: Clear to auscultation bilaterally.  CVS: S1, S2, No murmurs, No S3.   Abd: Soft,  Nontender.  MS: decreased  ROM spine, hips, shoulders and knees.  Ext: No edema.   CNS: CN 2-12 intact, power tone and sensation normal throughout.   Skin: Intact, no visible lesions or rashes.  Psych: Good eye contact, normal affect.  Memory intact, not anxious or depressed appearing.    Impression & Recommendations:  Problem # 1:  KNEE, ARTHRITIS, DEGEN./OSTEO (ICD-715.96) Assessment Improved  Her updated medication list for this problem includes:    Bayer Aspirin 325 Mg Tabs (Aspirin) ..... One tab by mouth once daily    Hydrocodone-acetaminophen 5-500 Mg Tabs (Hydrocodone-acetaminophen) ..... One tab q 4 hrs as needed    Nabumetone 500 Mg Tabs (Nabumetone) .Marland Kitchen... 1 p o two times a day  Problem # 2:  HYPERLIPIDEMIA (ICD-272.4) Assessment: Comment Only  Her updated medication list for this problem includes:    Lovastatin 40 Mg Tabs (Lovastatin) .Marland Kitchen... Take 1 tab by mouth at bedtime  Orders: T-Hepatic Function [phone]) T-Lipid Profile [phone])  Labs Reviewed: SGOT: 11 (06/14/2009)   SGPT: <8 U/L (06/14/2009)   HDL:53 (06/14/2009), 51 (09/05/2008)  LDL:104 (06/14/2009), 78 (66/44/0347)  Chol:171 (06/14/2009), 142 (09/05/2008)  Trig:69 (06/14/2009), 65 (09/05/2008)  Problem # 3:  HYPERTENSION (ICD-401.9) Assessment: Unchanged  The following medications were removed from the medication list:    Maxzide-25 37.5-25 Mg Tabs (Triamterene-hctz) ..... One tab by mouth once daily Her updated medication list for this problem includes:    Norvasc 5 Mg Tabs (Amlodipine besylate) .Marland Kitchen... Take 1 tab by mouth at bedtime    Furosemide 40 Mg Tabs (Furosemide) .Marland Kitchen... Take 1 tablet by mouth once a day    Triamterene-hctz 37.5-25 Mg Tabs (Triamterene-hctz) .Marland Kitchen... Take 1 tablet by mouth once a day  Orders: T-Basic Metabolic Panel [phone])  BP today: 134/80 Prior BP: 120/78 (08/25/2009)  Labs Reviewed: K+: 3.8 (06/14/2009) Creat: : 0.57 (06/14/2009)   Chol: 171 (06/14/2009)    HDL: 53 (06/14/2009)   LDL: 104 (06/14/2009)   TG: 69 (06/14/2009)  Problem # 4:  OBESITY, UNSPECIFIED (ICD-278.00) Assessment: Deteriorated  Ht: 62 (12/25/2009)   Wt: 253.75 (12/25/2009)   BMI: 46.58 (12/25/2009)  Complete Medication List: 1)  Bayer Aspirin 325 Mg Tabs (Aspirin) .... One tab by mouth once daily 2)  Oscal 500/200 D-3 500-200 Mg-unit Tabs (Calcium-vitamin d) .... One tab by mouth two times a day 3)  Norvasc 5 Mg Tabs (Amlodipine besylate) .... Take 1 tab by mouth at bedtime 4)  Klor-con M20 20 Meq Cr-tabs (Potassium chloride crys cr) .... Take 1 tablet by mouth once a day 5)  Hydrocodone-acetaminophen 5-500 Mg Tabs (Hydrocodone-acetaminophen) .... One tab q 4 hrs as needed 6)  Docusate Sodium 100 Mg Caps (Docusate sodium) .... Take 1 tablet by mouth two times a day 7)  Lovastatin 40 Mg Tabs (Lovastatin) .... Take 1 tab by mouth at bedtime 8)  Furosemide 40 Mg Tabs (Furosemide) .... Take 1 tablet by mouth once a day 9)  Nabumetone 500 Mg Tabs (Nabumetone) .Marland Kitchen.. 1 p o two times a day 10)  Triamterene-hctz 37.5-25 Mg Tabs (Triamterene-hctz) .... Take 1 tablet by mouth once a day  Patient Instructions:  1)  f/u in 5 months and 3 weeks 2)  It is important that you exercise regularly at least 20 minutes 5 times a week. If you develop chest pain, have severe difficulty breathing, or feel very tired , stop exercising immediately and seek medical attention. 3)  You need to lose weight. Consider a lower calorie diet and regular exercise.  4)  BMP prior to visit, ICD-9: 5)  Hepatic Panel prior to visit, ICD-9: fasting asap 6)  Lipid Panel prior to visit, ICD-9: 7)  no med changes Prescriptions: TRIAMTERENE-HCTZ 37.5-25 MG TABS (TRIAMTERENE-HCTZ) Take 1 tablet by mouth once a day  #90 x 1   Entered and Authorized by:   Syliva Overman MD   Signed by:   Syliva Overman MD on 12/25/2009   Method used:   Electronically to        [facility] (retail)       [address] [place], [place]  [address]       Ph: [phone]       Fax: [phone]   RxID:   [id number]

## 2010-06-26 NOTE — Miscellaneous (Signed)
  Clinical Lists Changes  Medications: Added new medication of METHOCARBAMOL 500 MG TABS (METHOCARBAMOL) 1 by mouth q 6 as needed spasms - Signed Rx of METHOCARBAMOL 500 MG TABS (METHOCARBAMOL) 1 by mouth q 6 as needed spasms;  #90 x 2;  Signed;  Entered by: Fuller Canada MD;  Authorized by: Fuller Canada MD;  Method used: Telephoned to [facility] [address], [address], [place], [place]  [address], Ph: [phone], Fax:    Prescriptions: METHOCARBAMOL 500 MG TABS (METHOCARBAMOL) 1 by mouth q 6 as needed spasms  #90 x 2   Entered and Authorized by:   Fuller Canada MD   Signed by:   Fuller Canada MD on 01/17/2010   Method used:   Telephoned to ...       [facility] [address] (retail)       [address]       [place], [place]  [address]       Ph: [phone]       Fax:    RxID:   [id number]

## 2010-06-26 NOTE — Progress Notes (Signed)
Summary: no refills  Phone Note Call from Patient   Summary of Call: needs her pota. pills filled [place] cell  [phone] call her back let her know Initial call taken by: Lind Guest,  October 26, 2009 4:46 PM    Prescriptions: KLOR-CON M20 20 MEQ CR-TABS (POTASSIUM CHLORIDE CRYS CR) Take 1 tablet by mouth once a day  #90 x 2   Entered by:   Everitt Amber LPN   Authorized by:   Syliva Overman MD   Signed by:   Everitt Amber LPN on 08/65/7846   Method used:   Electronically to        [facility] (retail)       [address] [place], [place]  [address]       Ph: [phone]       Fax: [phone]   RxID:   [id number]

## 2010-06-26 NOTE — Progress Notes (Signed)
  Phone Note From Pharmacy   Caller: [facility] Summary of Call: dr Leanord Hawking changed to diovan hct to benicar hct 40/25 on 3/19.  what med should patient be on? Initial call taken by: Adella Hare LPN,  August 30, 2009 2:29 PM  Follow-up for Phone Call        she can go back to her old med diovan/hctz, the benicar was started because of hosp formulary, pls ensure her diovan/hctz has refills Follow-up by: Syliva Overman MD,  August 31, 2009 4:59 AM    Prescriptions: DIOVAN HCT 320-25 MG TABS (VALSARTAN-HYDROCHLOROTHIAZIDE) Take 1 tablet by mouth once a day  #30 x 2   Entered by:   Adella Hare LPN   Authorized by:   Syliva Overman MD   Signed by:   Adella Hare LPN on 81/19/1478   Method used:   Electronically to        [facility] (retail)       [address] [place], [place]  [address]       Ph: [phone]       Fax: [phone]   RxID:   [id number]

## 2010-06-26 NOTE — Assessment & Plan Note (Signed)
Summary: RT KNEE PAIN/NEEDS XRAY/REF DR SIMPSON/CAF   Visit Type:  Follow-up, new problem  Primary Provider:  Syliva Overman MD  CC:  right knee pain .  History of Present Illness: I saw Patricia Fuentes in the office today for a followup visit.  She is a 65 years old woman with the complaint of:  chief complaint: right knee pain, leg pain with weakness, referral Simpson.  06/13/08 was seen in our office for Anserine bursitis right knee was given a shot and advised to use ice, helped for a few weeks.  She currently complains of diffuse knee pain and tenderness, primarily over the inner calf with no radiation of pain from her back, which has been present for years. No new injury. Her pain is 4/10. It is worse with walking. It is unrelieved by ibuprofen. It is improved by rest. It is described as constant throbbing pain.  Her knee is stiff. No catching or locking has some swelling.  She feels like her knee is weak.  She is not having back pain/ MRI back in 10/20/2008 and ESI helped     Current Medications (verified): 1)  Bayer Aspirin 325 Mg  Tabs (Aspirin) .... One Tab By Mouth Once Daily 2)  Oscal 500/200 D-3 500-200 Mg-Unit  Tabs (Calcium-Vitamin D) .... One Tab By Mouth Two Times A Day 3)  Norvasc 5 Mg Tabs (Amlodipine Besylate) .... Take 1 Tab By Mouth At Bedtime 4)  Lovastatin 40 Mg Tabs (Lovastatin) .... One Tab By Mouth Qhs 5)  Diovan Hct 320-25 Mg Tabs (Valsartan-Hydrochlorothiazide) .... Take 1 Tablet By Mouth Once A Day 6)  Lasix 40 Mg Tabs (Furosemide) .... Take 1 Tablet By Mouth Two Times A Day 7)  Klor-Con M20 20 Meq Cr-Tabs (Potassium Chloride Crys Cr) .... Take 1 Tablet By Mouth Two Times A Day 8)  Ibuprofen 800 Mg Tabs (Ibuprofen) .... Take 1 Tablet By Mouth Two Times A Day As Needed  Allergies (verified): 1)  ! Phenergan  Past History:  Past Medical History: Last updated: 08/01/2008 Current Problems:  UTI (ICD-599.0) HYPERTENSION  (ICD-401.9) hyperlipidemia  Past Surgical History: Last updated: 02/02/2009 TAH Arthroscopy left knee hysterectomy  Family History: Last updated: 10/21/2007 Mom deceased stroke and HTN Father deceased had DM<Stroke and heart failure Brothers 3 living two of whom are HTN, Sister 1 living healthy  Social History: Last updated: 10/21/2007 Employed Married Four children Never Smoked Alcohol use-no Drug use-no  Risk Factors: Smoking Status: never (Oct 21, 2007)  Review of Systems Neuro:  See HPI. MS:  See HPI.  The review of systems is negative for General, Cardiac , Resp, GI, GU, MS, Endo, Psych, Derm, EENT, Immunology, and Lymphatic.  Physical Exam  Additional Exam:  This is an obese black female otherwise normal development growing and hygiene.  Vital signs will be recorded at a preop visit as well as at the day of surgery.  Cardiovascular exam normal pulse perfusion temperature no edema.  Lymph nodes normal.  Skin warm dry and intact normal x4 extremities  Neurologic examination she is awake alert and oriented x3 mood and affect are normal she has normal sensation and reflexes.  Coordination and balance are normal  Musculoskeletal exam she is ambulating with a cane.  Her knee is in varus.  She still maintains 115 of knee flexion good strength and no instability.  She is very tender in the medial compartment as well as the medial pedis bursa..  The LEFT lower extremity inspection was normal other  than a varus knee.  She maintained a good range of motion strength and stability in the knee.   The upper extremities have normal appearance, ROM, strength and stability.     Impression & Recommendations:  Problem # 1:  DEGENERATIVE JOINT DISEASE, KNEES,  (ICD-715.96) Assessment Comment Only  new x-rays are taken of the RIGHT knee compared to films taken last year. There is severe medial joint space compromise with multiple osteophytes as tibial rotation and varus  deformity severe patellofemoral arthritis.  Impression severe arthritis, RIGHT knee.  Assessment: Patient also has severe bursitis, but also has joint related arthritic pain and understands that the surgery will not to the bursitis. She distinctly denies any back related symptoms.  Consent was done I informed consent method.  Risk and benefits of procedure discussed which included but or or not limited to bleeding infection stiffness pain.  Amputation.  Blood clot, pulmonary embolus.  Patient agrees to surgery with those known risk factors.  Recommend RIGHT total knee replacement /Smith-Nephew   Her updated medication list for this problem includes:    Bayer Aspirin 325 Mg Tabs (Aspirin) ..... One tab by mouth once daily    Ibuprofen 800 Mg Tabs (Ibuprofen) .Marland Kitchen... Take 1 tablet by mouth two times a day as needed  Other Orders: Est. Patient Level IV (23557)  Patient Instructions: 1)  DOS 07/11/09 2)  Stop aspirin 07/05/09 3)  preop at [facility] on 07/04/09 at 9:00am take packet with you. 4)  Post op 1 in our office on 07/25/09

## 2010-06-26 NOTE — Letter (Signed)
Summary: History form  History form   Imported By: Jacklynn Ganong 06/22/2009 16:32:52  _____________________________________________________________________  External Attachment:    Type:   Image     Comment:   External Document

## 2010-06-26 NOTE — Letter (Signed)
Summary: medical release  medical release   Imported By: Lind Guest 03/30/2010 13:48:07  _____________________________________________________________________  External Attachment:    Type:   Image     Comment:   External Document

## 2010-06-26 NOTE — Miscellaneous (Signed)
Summary: preop date will be 07/07/09  Clinical Lists Changes

## 2010-06-26 NOTE — Letter (Signed)
Summary: Work [facility]  [address]  [place], [place] [address]   Phone: [phone]  Fax: [phone]    Today's Date: October 25, 2009  Name of Patient: Patricia Fuentes  The above named patient had a medical visit in our office on: 10/09/2009.  Please take this into consideration when reviewing the time away from work.    Special Instructions:   [ X ] To continue out of work status, tentatively until the end of this year, or until further notice.   [ X ] Other _ Next scheduled appointment:  ________________________________________________________________________   Sincerely yours,   Terrance Mass, MD

## 2010-06-26 NOTE — Letter (Signed)
Summary: DHHS FL2 form  DHHS FL2 form   Imported By: Cammie Sickle 09/06/2009 09:26:37  _____________________________________________________________________  External Attachment:    Type:   Image     Comment:   External Document

## 2010-06-26 NOTE — Assessment & Plan Note (Signed)
Summary: 6 WK RE-CK RT TKA/POST OP 07/11/09/EXAM ONLY/[facility]F   Visit Type:  Follow-up Primary Provider:  Syliva Overman MD  CC:  post op TKA.  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  she complains of tiredness in both legs RIGHT worse and LEFT and pain in the LEFT knee status post LEFT knee arthroscopy several years ago  She also complains of a tired feeling when she stands for long periods of time  She is doing well with her physical therapy her range of motion is increased to 100  She does have full extension  Exam shows a stable knee zero-100 range of motion knee is stable      Allergies: 1)  ! Phenergan   Impression & Recommendations:  Problem # 1:  TOTAL KNEE FOLLOW-UP (ICD-V43.65) Assessment Improved  Orders: Post-Op Check ([address])  Problem # 2:  KNEE, ARTHRITIS, DEGEN./OSTEO (ICD-715.96) Assessment: Unchanged  LEFT knee arthritis Her updated medication list for this problem includes:    Bayer Aspirin 325 Mg Tabs (Aspirin) ..... One tab by mouth once daily    Hydrocodone-acetaminophen 5-500 Mg Tabs (Hydrocodone-acetaminophen) ..... One tab q 4 hrs as needed    Nabumetone 500 Mg Tabs (Nabumetone) .Marland Kitchen... 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  I base this opinion on her spinal stenosis with back pain and radiculopathy and bilateral knee arthritis status post RIGHT knee replacement.  We'll probably need a LEFT knee replacement.  Orders: Post-Op Check [phone])  Medications Added to Medication  List This Visit: 1)  Nabumetone 500 Mg Tabs (Nabumetone) .Marland Kitchen.. 1 p o two times a day  Patient Instructions: 1)  finish therapy return to me in 3 months  Prescriptions: NABUMETONE 500 MG TABS (NABUMETONE) 1 p o two times a day  #60 x 2   Entered and Authorized by:   Fuller Canada MD   Signed by:   Fuller Canada MD on 10/09/2009   Method used:   Faxed to ...       [facility] (retail)       [address]       [place], [place]  [address]       Ph: [phone]       Fax: [phone]   RxID:   [id number]

## 2010-06-26 NOTE — Letter (Signed)
Summary: Medical record re-fax [facility]  Medical record re-fax [facility]   Imported By: Cammie Sickle 02/22/2010 08:33:15  _____________________________________________________________________  External Attachment:    Type:   Image     Comment:   External Document

## 2010-06-26 NOTE — Progress Notes (Signed)
Summary: nurse  Phone Note Call from Patient   Summary of Call: woood like to get a rx for maxide and potissum to take to [facility]. other pharm to [facility] [phone] Initial call taken by: Rudene Anda,  January 02, 2010 2:14 PM    Prescriptions: TRIAMTERENE-HCTZ 37.5-25 MG TABS (TRIAMTERENE-HCTZ) Take 1 tablet by mouth once a day  #90 x 1   Entered by:   Adella Hare LPN   Authorized by:   Syliva Overman MD   Signed by:   Adella Hare LPN on 09/81/1914   Method used:   Telephoned to ...       [facility] [phone] (retail)       [address]       [place], [place]  [address]       Ph: [phone]       Fax:    RxID:   [id number] KLOR-CON M20 20 MEQ CR-TABS (POTASSIUM CHLORIDE CRYS CR) Take 1 tablet by mouth once a day  #30 Each x 5   Entered by:   Adella Hare LPN   Authorized by:   Syliva Overman MD   Signed by:   Adella Hare LPN on 25/36/6440   Method used:   Telephoned to ...       [facility] [address] (retail)       [address]       [place], [place]  [address]       Ph: [phone]       Fax:    RxID:   [id number]

## 2010-06-26 NOTE — Letter (Signed)
Summary: Medical record request Disability Determ  Medical record request Disability Determ   Imported By: Cammie Sickle 03/23/2010 11:06:52  _____________________________________________________________________  External Attachment:    Type:   Image     Comment:   External Document

## 2010-06-26 NOTE — Letter (Signed)
Summary: Medical equipment Disability Determination   Medical equipment Disability Determination   Imported By: Cammie Sickle 04/20/2010 14:04:42  _____________________________________________________________________  External Attachment:    Type:   Image     Comment:   External Document

## 2010-06-26 NOTE — Miscellaneous (Signed)
Summary: [employer] plan of care  [employer] plan of care   Imported By: Jacklynn Ganong 09/12/2009 09:10:56  _____________________________________________________________________  External Attachment:    Type:   Image     Comment:   External Document

## 2010-06-26 NOTE — Letter (Signed)
Summary: FMLA forms  FMLA forms   Imported By: Cammie Sickle 07/19/2009 11:09:49  _____________________________________________________________________  External Attachment:    Type:   Image     Comment:   External Document

## 2010-06-26 NOTE — Assessment & Plan Note (Signed)
Summary: office visit   Vital Signs:  Patient profile:   65 year old female Menstrual status:  hysterectomy Height:      62 inches Weight:      256 pounds BMI:     46.99 O2 Sat:      93 % on Room air Pulse rate:   87 / minute Pulse rhythm:   regular Resp:     16 per minute BP sitting:   130 / 80  Vitals Entered By: Worthy Keeler LPN (June 07, 2009 2:18 PM)  Nutrition Counseling: Patient's BMI is greater than 25 and therefore counseled on weight management options.  O2 Flow:  Room air CC: legs bothering her Is Patient Diabetic? No Pain Assessment Patient in pain? no        Primary Care Provider:  Syliva Overman MD  CC:  legs bothering her.  History of Present Illness: pt has not been to work this week because of disabling knee pain, espescially the right knee which is becoming increasingly unstable.She has now  started using a cane, and feels that she needs some type of surgical intervention or at least an orthopedic eval befoe she is able to return to work. She has been successful in losing 10 pounds since her last visit , which is certainly encouraging. She otherwise has no current health concerns,  Denies recent fever or chills. Denies sinus pressure, nasal congestion , ear pain or sore throat. Denies chest congestion, or cough productive of sputum. Denies chest pain, palpitations, PND, orthopnea or leg swelling. Denies abdominal pain, nausea, vomitting, diarrhea or constipation. Denies change in bowel movements or bloody stool. Denies dysuria , frequency, incontinence or hesitancy.  Denies headaches, vertigo, seizures. Denies depression, anxiety or insomnia. Denies  rash, lesions, or itch.     Allergies (verified): 1)  ! Phenergan  Review of Systems      See HPI Eyes:  Denies blurring and discharge. MS:  Complains of joint pain and stiffness; increased right knee pain with stifness and instability, has started using a cane worse this week but  progressing in the last 3 months. Neuro:  Denies headaches. Endo:  Denies cold intolerance, excessive hunger, excessive thirst, excessive urination, heat intolerance, polyuria, and weight change. Heme:  Denies abnormal bruising and bleeding. Allergy:  Denies hives or rash and sneezing.  Physical Exam  General:  Well-developed,obese,in no acute distress; alert,appropriate and cooperative throughout examination. Pt ambulates with a cane at this time, and her gait is abnormal. HEENT: No facial asymmetry,  EOMI, No sinus tenderness, TM's Clear, oropharynx  pink and moist.   Chest: Clear to auscultation bilaterally.  CVS: S1, S2, No murmurs, No S3.   Abd: Soft, Nontender.  ZO:XWRUEAVWU ROM spine,adequate in  hips and shoulders and  reduced in both knees, with increased deformity and crepitus of the right knee  Ext: No edema.   CNS: CN 2-12 intact, power tone and sensation normal throughout.   Skin: Intact, no visible lesions or rashes.  Psych: Good eye contact, normal affect.  Memory intact, not anxious or depressed appearing.    Impression & Recommendations:  Problem # 1:  KNEE PAIN, RIGHT (ICD-719.46) Assessment Deteriorated  Her updated medication list for this problem includes:    Bayer Aspirin 325 Mg Tabs (Aspirin) ..... One tab by mouth once daily    Ibuprofen 800 Mg Tabs (Ibuprofen) .Marland Kitchen... Take 1 tablet by mouth two times a day as needed  Orders: Orthopedic Referral (Ortho)  Problem # 2:  LEG  EDEMA, BILATERAL (ICD-782.3) Assessment: Improved  Her updated medication list for this problem includes:    Diovan Hct 320-25 Mg Tabs (Valsartan-hydrochlorothiazide) .Marland Kitchen... Take 1 tablet by mouth once a day    Lasix 40 Mg Tabs (Furosemide) .Marland Kitchen... Take 1 tablet by mouth two times a day  Problem # 3:  HYPERLIPIDEMIA (ICD-272.4) Assessment: Comment Only  Her updated medication list for this problem includes:    Lovastatin 40 Mg Tabs (Lovastatin) ..... One tab by mouth  qhs  Orders: T-Hepatic Function [id number]) T-Lipid Profile [id number])  Labs Reviewed: SGOT: 18 (09/05/2008)   SGPT: 14 (09/05/2008)   HDL:51 (09/05/2008), 59 (10/07/2007)  LDL:78 (09/05/2008), 108 (29/56/2130)  Chol:142 (09/05/2008), 181 (10/07/2007)  Trig:65 (09/05/2008), 71 (10/07/2007)  Problem # 4:  OBESITY, UNSPECIFIED (ICD-278.00) Assessment: Improved  Ht: 62 (06/07/2009)   Wt: 256 (06/07/2009)   BMI: 46.99 (06/07/2009)  Problem # 5:  HYPERTENSION (ICD-401.9) Assessment: Unchanged  Her updated medication list for this problem includes:    Norvasc 5 Mg Tabs (Amlodipine besylate) .Marland Kitchen... Take 1 tab by mouth at bedtime    Diovan Hct 320-25 Mg Tabs (Valsartan-hydrochlorothiazide) .Marland Kitchen... Take 1 tablet by mouth once a day    Lasix 40 Mg Tabs (Furosemide) .Marland Kitchen... Take 1 tablet by mouth two times a day  Orders: T-Basic Metabolic Panel [phone])  BP today: 130/80 Prior BP: 120/80 (12/15/2008)  Labs Reviewed: K+: 4.3 (12/01/2008) Creat: : 0.61 (12/01/2008)   Chol: 142 (09/05/2008)   HDL: 51 (09/05/2008)   LDL: 78 (09/05/2008)   TG: 65 (09/05/2008)  Complete Medication List: 1)  Bayer Aspirin 325 Mg Tabs (Aspirin) .... One tab by mouth once daily 2)  Oscal 500/200 D-3 500-200 Mg-unit Tabs (Calcium-vitamin d) .... One tab by mouth two times a day 3)  Norvasc 5 Mg Tabs (Amlodipine besylate) .... Take 1 tab by mouth at bedtime 4)  Lovastatin 40 Mg Tabs (Lovastatin) .... One tab by mouth qhs 5)  Diovan Hct 320-25 Mg Tabs (Valsartan-hydrochlorothiazide) .... Take 1 tablet by mouth once a day 6)  Lasix 40 Mg Tabs (Furosemide) .... Take 1 tablet by mouth two times a day 7)  Klor-con M20 20 Meq Cr-tabs (Potassium chloride crys cr) .... Take 1 tablet by mouth two times a day 8)  Ibuprofen 800 Mg Tabs (Ibuprofen) .... Take 1 tablet by mouth two times a day as needed  Other Orders: T-CBC w/Diff ([id number]) T-TSH ([id number]) Radiology Referral (Radiology)  Patient  Instructions: 1)  Please schedule a follow-up appointment in 4 months. 2)  It is important that you exercise regularly at least 20 minutes 5 times a week. If you develop chest pain, have severe difficulty breathing, or feel very tired , stop exercising immediately and seek medical attention. 3)  You need to lose weight. Consider a lower calorie diet and regular exercise. CONGRATS`on weight loss keep it up. 4)  You are being referred to ortho about your knee. 5)  It is vital you get your mamo asap 6)  BMP prior to visit, ICD-9: 7)  Hepatic Panel prior to visit, ICD-9: 8)  Lipid Panel prior to visit, ICD-9:  fasting asap 9)  CBC w/ Diff prior to visit, ICD-9: 10)  TSH prior to visit, ICD-9: 11)  i have sent in ibuprofe 800mg  takee one daily if needed for pain, pls stop bC, only one 81mg  asprin daily. Use either extrastrwength tylenol or tylenolpm at night one Prescriptions: IBUPROFEN 800 MG TABS (IBUPROFEN) Take 1 tablet by mouth two times a day as needed  #  60 x 2   Entered and Authorized by:   Syliva Overman MD   Signed by:   Syliva Overman MD on 06/07/2009   Method used:   Electronically to        [facility]* (retail)       [address]       [place], [place]  [address]       Ph: [phone]       Fax: [phone]   RxID:   [id number]

## 2010-06-26 NOTE — Assessment & Plan Note (Signed)
Summary: 5 M RE-CK/XRAY/TKA 07/11/09/[facility]/CAF   Visit Type:  Follow-up Primary Provider:  Syliva Overman MD  CC:  right knee replacement..  History of Present Illness: I saw Patricia Fuentes in the office today for a 3 month  followup visit.  She is a 65 years old woman with the complaint of:  right knee  Procedure  TKA right knee [07/11/2009]  Medication :  Hydrocodone 5 takes every 4 hrs, Nabumetome.  Uses [facility] [place].  She is doing well   c/o some back pain has h/o spinal stenosis/ MRI confirmed   c/o left knee pain h/o OA    Allergies: 1)  ! Phenergan  Past History:  Past Medical History: Last updated: 08/01/2008 Current Problems:  UTI (ICD-599.0) HYPERTENSION (ICD-401.9) hyperlipidemia  Past Surgical History: Last updated: 08/25/2009 [facility] Arthroscopy left knee hysterectomy knee rplacement right 02'/15/2011  Dr Samuella Cota, recovered at [facility] center for 1 month  Review of Systems Neurologic:  Complains of numbness and tingling; occasional legs . Musculoskeletal:  Complains of joint pain and stiffness; left knee .  Physical Exam  Additional Exam:  Constitutional:  General: normal development and grooming.  CDV: Observation and palpation was normal   Skin: inspection and palpation of the skin revealed no abnormalities  right knee inccision is normal    Psyche: Alert and oriented x 3. Mood was normal.  Affect: normal  MSK: Gait: normal with cane [uses for left knee]  The right knee has 115 ROM  stable no swelling   the left knee has medial knee tenderness  flexion 110        Impression & Recommendations:  Problem # 1:  KNEE, ARTHRITIS, DEGEN./OSTEO (ICD-715.96)-left Assessment Deteriorated  Verbal consent was obtained. The knee was prepped with alcohol and ethyl chloride. 1 cc of depomedrol 40mg /cc and 4 cc of lidocaine 1% was injected. there were no complications. left knee joint   Her updated medication list for this problem  includes:    Bayer Aspirin 325 Mg Tabs (Aspirin) ..... One tab by mouth once daily    Hydrocodone-acetaminophen 5-500 Mg Tabs (Hydrocodone-acetaminophen) ..... One tab q 4 hrs as needed    Nabumetone 500 Mg Tabs (Nabumetone) .Marland Kitchen... 1 p o two times a day  Orders: Est. Patient Level IV ([address]) Joint Aspirate / Injection, Large (20610) Depo- Medrol 40mg  (J1030) Knee x-ray,  3 views (60[address])  Problem # 2:  TOTAL KNEE FOLLOW-UP (ICD-V43.65)-right  Assessment: Improved  Patient Instructions: 1)  You have received an injection of cortisone today. You may experience increased pain at the injection site. Apply ice pack to the area for 20 minutes every 2 hours and take 2 xtra strength tylenol every 8 hours. This increased pain will usually resolve in 24 hours. The injection will take effect in 3-10 days.  2)  xray Feb 2012 rt TKA

## 2010-06-26 NOTE — Letter (Signed)
Summary: Work status note  Work status note   Imported By: Cammie Sickle 11/10/2009 14:36:36  _____________________________________________________________________  External Attachment:    Type:   Image     Comment:   External Document

## 2010-06-26 NOTE — Progress Notes (Signed)
Summary: med  Phone Note Call from Patient   Summary of Call: needs to speak with nurse about meds. [phone] Initial call taken by: Rudene Anda,  October 30, 2009 2:02 PM  Follow-up for Phone Call        wants diovan changed to somthing cheaper Follow-up by: Adella Hare LPN,  October 31, 6267 4:35 PM  Additional Follow-up for Phone Call Additional follow up Details #1::        advise and erx maxzide 25mg  one daily, d/c diovan/hctz #30 refill one only, and she needs an ov in 5 weeks so i can check the blood pressure, pls let her know Additional Follow-up by: Syliva Overman MD,  October 30, 2009 5:03 PM    Additional Follow-up for Phone Call Additional follow up Details #2::    med changed, called patient, left message Follow-up by: Adella Hare LPN,  October 31, 4852 10:14 AM  Additional Follow-up for Phone Call Additional follow up Details #3:: Details for Additional Follow-up Action Taken: pt called back and gave her [place] message. pt has appt on aug 1st Additional Follow-up by: Rudene Anda,  October 31, 2009 10:19 AM  New/Updated Medications: MAXZIDE-25 37.5-25 MG TABS (TRIAMTERENE-HCTZ) one tab by mouth once daily Prescriptions: MAXZIDE-25 37.5-25 MG TABS (TRIAMTERENE-HCTZ) one tab by mouth once daily  #30 x 2   Entered by:   Adella Hare LPN   Authorized by:   Syliva Overman MD   Signed by:   Adella Hare LPN on 62/70/3500   Method used:   Electronically to        [facility] (retail)       [address] [place], [place]  [address]       Ph: [phone]       Fax: [phone]   RxID:   [id number]

## 2010-06-26 NOTE — Assessment & Plan Note (Signed)
Summary: POST OP 1/RT TOTAL KNEE/SURG 07/11/09/[facility]/CAF   Visit Type:  post op #1 Primary Provider:  Syliva Overman MD  CC:  post op #1 rigkt TKA.  History of Present Illness: Patricia Fuentes is 2 weeks after RIGHT total knee replacement  She is at the local hospital nursing facility  She is doing well.  She is excellent extension she has 75 of knee flexion.  She says she is not ready to go home.  She will continue her therapy at the facility and then progressed to where she can go home we took her staples out her incision looks good she does have some bursitis on the medial side of the knee which is been bothering her since even before the surgery  She comes back in 4 weeks we will take a total knee series.  Allergies: 1)  ! Phenergan   Impression & Recommendations:  Problem # 1:  KNEE, ARTHRITIS, DEGEN./OSTEO (ICD-715.96) Assessment Comment Only  Her updated medication list for this problem includes:    Bayer Aspirin 325 Mg Tabs (Aspirin) ..... One tab by mouth once daily    Ibuprofen 800 Mg Tabs (Ibuprofen) .Marland Kitchen... Take 1 tablet by mouth two times a day as needed  Orders: Post-Op Check ([address])  Problem # 2:  TOTAL KNEE FOLLOW-UP (ICD-V43.65) Assessment: Comment Only  Orders: Post-Op Check (60[address])  Patient Instructions: 1)  return in 4 weeks , X-rays Right TKA  2)  shower ok no tub bath  3)  steristrips will saty on for 1 week replace as needed

## 2010-06-26 NOTE — Letter (Signed)
Summary: Surgery order RTtotal knee sched 07/11/09  Surgery order RTtotal knee sched 07/11/09   Imported By: Cammie Sickle 08/21/2009 16:35:06  _____________________________________________________________________  External Attachment:    Type:   Image     Comment:   External Document

## 2010-06-26 NOTE — Progress Notes (Signed)
  Phone Note Other Incoming   Request: Send information Summary of Call: Request for records received from [facility]. Request forwarded to [employer].     

## 2010-06-26 NOTE — Progress Notes (Signed)
Summary: call from patient, states [employer] asked for more info  Phone Note Call from Patient   Caller: Patient Summary of Call: Patient called stating she is having problems w/long-term disability insurer, [employer].  Told by insurance claims department that they have again rec'd the same office note (01/17/10).  Patient has not had another visit or physical therapy since then.  I've left a voice msg for insurance rep Chrystine Oiler, who has assisted patient previously. Initial call taken by: Cammie Sickle,  February 26, 2010 12:53 PM  Follow-up for Phone Call        Left voice mail msg for [place], 02/26/10. * He returned call - states rec'd notes.    Follow-up by: Cammie Sickle,  February 27, 2010 9:50 AM  Additional Follow-up for Phone Call Additional follow up Details #1::        I relayed information to patient. Also left fol/up voice msg for [place] at [employer] to contact our office and/or patient if any additional information is needed. Additional Follow-up by: Cammie Sickle,  February 27, 2010 9:53 AM

## 2010-06-26 NOTE — Letter (Signed)
Summary: Medical records fax [employer]  Medical records fax [facility] [employer]   Imported By: Cammie Sickle 01/30/2010 19:56:05  _____________________________________________________________________  External Attachment:    Type:   Image     Comment:   External Document

## 2010-06-26 NOTE — Miscellaneous (Signed)
Summary: Pre-cert information in-patient surgery   Clinical Lists Changes   Re: Pre-cert/pre-authorization for in-patient surgery,CPT [phone], dx 715.96, contacted insurer   [facility]. Left voice msg for Herbert Seta, clinical intake nurse + faxed clinicals to [phone] to request review and pre-auth.   ** Pre-Authorization rec'd via fax:  # [phone] approved for 3 day stay, adm 07/11/09, per Herbert Seta at [facility] - Ph [phone] Ext [phone]

## 2010-06-26 NOTE — Letter (Signed)
Summary: Order   Order   Imported By: Cammie Sickle 08/21/2009 16:37:17  _____________________________________________________________________  External Attachment:    Type:   Image     Comment:   External Document

## 2010-06-26 NOTE — Letter (Signed)
Summary: disab form [facility]  disab form [employer]   Imported By: Cammie Sickle 11/14/2009 07:16:36  _____________________________________________________________________  External Attachment:    Type:   Image     Comment:   External Document

## 2010-06-26 NOTE — Assessment & Plan Note (Signed)
Summary: POST OP 2/XRAYS/TKA 07/11/09/RT KNEE/[facility]/CAF   Visit Type:  Follow-up Primary Provider:  Syliva Overman MD  CC:  post op tka.  History of Present Illness:    Procedure  TKA right knee [07/11/2009]  Medication Ibuprofen 800mg , Hydrocodone 5.  [facility].  She is doing very well. Exam of her with a cane. She has full extension of her knee and over 90 of flexion.  Recommend therapy as an outpatient. She should still be out of work at least another 6 weeks to reassess. @ 12 week.  The  alignment was normal, PTF reduced without tilt or subluxation, no evidence of loosening.  IMPRESSION: normal appearance of the implant      Allergies: 1)  ! Phenergan   Other Orders: Physical Therapy Referral (PT) Post-Op Check (44034)  Patient Instructions: 1)  Come back in 6 weeks exam only right knee 2)  continue therapy

## 2010-06-26 NOTE — Miscellaneous (Signed)
Summary: Nursing Home order  Nursing Home order   Imported By: Cammie Sickle 08/21/2009 16:40:27  _____________________________________________________________________  External Attachment:    Type:   Image     Comment:   External Document

## 2010-06-26 NOTE — Progress Notes (Signed)
Summary: referral  Phone Note Other Incoming   Caller: dr simpson Summary of Call: i spoke with the pt. he is to make an appt with Dr. Romeo Apple and call you with the appt info. tomorrow, she understands that it ios impt to do so. pls document her appt for ortho in this record, if she is not getting appt for this week, pls call and try to expedite this, she is out of work since 01/11 because of knee probs, sher has ssen him before. Initial call taken by: Syliva Overman MD,  June 11, 2009 2:52 PM  Follow-up for Phone Call        I see where you have sent this back I do not know why. Again if she is not seeing hiom this week I need your help Follow-up by: Syliva Overman MD,  June 12, 2009 9:40 AM  Additional Follow-up for Phone Call Additional follow up Details #1::        Dr. Diamantina [facility] office called and stated they couldn't get pt in until 06/19/2009 9:15. And they actually was sworking her in for that day. They also stated they would put her on a cancellation list.  pt was called and left message for her to call me back. Additional Follow-up by: Rudene Anda,  June 12, 2009 11:16 AM    Additional Follow-up for Phone Call Additional follow up Details #2::    pt has appt with dr. Romeo Apple for 06/19/2009 9:15. she is also on a cancellation list. pt was called and notified that she had appt with dr.harrison.  Follow-up by: Rudene Anda,  June 12, 2009 12:09 PM

## 2010-06-26 NOTE — Letter (Signed)
Summary: [facility] disab form  [facility] disab form   Imported By: Cammie Sickle 07/19/2009 15:31:53  _____________________________________________________________________  External Attachment:    Type:   Image     Comment:   External Document

## 2010-06-29 ENCOUNTER — Telehealth (INDEPENDENT_AMBULATORY_CARE_PROVIDER_SITE_OTHER): Payer: Self-pay | Admitting: *Deleted

## 2010-07-04 NOTE — Progress Notes (Signed)
Summary: rx  Phone Note Call from Patient   Summary of Call: pt says rx hasn't been called in yet. [phone] Initial call taken by: Rudene Anda,  June 29, 2010 10:03 AM  Follow-up for Phone Call        was sent to [place]. Cancelled there and sent to [facility]  Follow-up by: Everitt Amber LPN,  June 29, 2010 10:42 AM    Prescriptions: CIPROFLOXACIN HCL 500 MG TABS (CIPROFLOXACIN HCL) one tab by mouth two times a day  #10 x 0   Entered by:   Everitt Amber LPN   Authorized by:   Syliva Overman MD   Signed by:   Everitt Amber LPN on 29/52/8413   Method used:   Electronically to        [facility] [phone]* (retail)       [address]       [place], [place]  [address]       Ph: [phone] or [phone]       Fax: [phone]   RxID:   [id number] FLUCONAZOLE 150 MG TABS (FLUCONAZOLE) one tab by mouth once daily  #2 x 0   Entered by:   Everitt Amber LPN   Authorized by:   Syliva Overman MD   Signed by:   Everitt Amber LPN on 35/57/3220   Method used:   Electronically to        [facility] [phone]* (retail)       [address]       [place], [place]  [address]       Ph: [phone] or [phone]       Fax: [phone]   RxID:   [id number]

## 2010-07-04 NOTE — Assessment & Plan Note (Signed)
Summary: office visit   Vital Signs:  Patient profile:   65 year old female Menstrual status:  hysterectomy Height:      62 inches Weight:      272.25 pounds BMI:     49.98 O2 Sat:      93 % Pulse rate:   85 / minute Resp:     16 per minute BP sitting:   128 / 86  (left arm) Cuff size:   large  Vitals Entered By: Everitt Amber LPN (June 25, 2010 9:44 AM) CC: Follow up chronic problems, has been coughing and wheezing alot, bringing up yellowish phlegm, feeling really tired. Going on for 3 weeks now    Primary Care Provider:  Syliva Overman MD  CC:  Follow up chronic problems, has been coughing and wheezing alot, bringing up yellowish phlegm, and feeling really tired. Going on for 3 weeks now .  History of Present Illness: 2 month h/o sore chest , worse with direct pressure. Fatigue, weight gain, demotivated, denies depression, anxiety or insomnia. increased dyspnea with minimal activity, denies chest pain, paptaitions, PND or orthopnea reports cough productive of yellow sputum occasionally  Current Medications (verified): 1)  Bayer Aspirin 325 Mg  Tabs (Aspirin) .... One Tab By Mouth Once Daily 2)  Oscal 500/200 D-3 500-200 Mg-Unit  Tabs (Calcium-Vitamin D) .... One Tab By Mouth Two Times A Day 3)  Norvasc 5 Mg Tabs (Amlodipine Besylate) .... Take 1 Tab By Mouth At Bedtime 4)  Klor-Con M20 20 Meq Cr-Tabs (Potassium Chloride Crys Cr) .... Take 1 Tablet By Mouth Once A Day 5)  Hydrocodone-Acetaminophen 5-500 Mg Tabs (Hydrocodone-Acetaminophen) .... One Tab Q 4 Hrs As Needed 6)  Lovastatin 40 Mg Tabs (Lovastatin) .... Take 1 Tab By Mouth At Bedtime 7)  Furosemide 40 Mg Tabs (Furosemide) .... Take 1 Tablet By Mouth Once A Day 8)  Nabumetone 500 Mg Tabs (Nabumetone) .Marland Kitchen.. 1 By Mouth Two Times A Day 9)  Triamterene-Hctz 37.5-25 Mg Tabs (Triamterene-Hctz) .... Take 1 Tablet By Mouth Once A Day 10)  Methocarbamol 500 Mg Tabs (Methocarbamol) .Marland Kitchen.. 1 By Mouth Every 6 Hours As Needed  Spasms  Allergies (verified): 1)  ! Phenergan  Review of Systems      See HPI General:  Complains of fatigue and malaise. Eyes:  Denies discharge. Resp:  Complains of cough, shortness of breath, and sputum productive. GU:  Complains of urinary frequency; malodoros urine x 3 weeks. Endo:  Denies cold intolerance, excessive hunger, excessive thirst, excessive urination, and heat intolerance. Heme:  Denies abnormal bruising and bleeding. Allergy:  Complains of seasonal allergies.  Physical Exam  General:  Well-developed,obese,in no acute distress; alert,appropriate and cooperative throughout examination HEENT: No facial asymmetry,  EOMI, No sinus tenderness, TM's Clear, oropharynx  pink and moist.   Chest: Clear to auscultation bilaterally.  CVS: S1, S2, No murmurs, No S3.   Abd: Soft, Nontender.  MS: decreased  ROM spine, hips,  and knees.  Ext: No edema.   CNS: CN 2-12 intact, power tone and sensation normal throughout.   Skin: Intact, no visible lesions or rashes.  Psych: Good eye contact, normal affect.  Memory intact, not anxious or depressed appearing.    Impression & Recommendations:  Problem # 1:  OBESITY, UNSPECIFIED (ICD-278.00) Assessment Deteriorated  Ht: 62 (06/25/2010)   Wt: 272.25 (06/25/2010)   BMI: 49.98 (06/25/2010) therapeutic lifestyle change discussed and encouraged  Problem # 2:  HYPERTENSION (ICD-401.9) Assessment: Unchanged  Her updated medication list for this  problem includes:    Norvasc 5 Mg Tabs (Amlodipine besylate) .Marland Kitchen... Take 1 tab by mouth at bedtime    Furosemide 40 Mg Tabs (Furosemide) .Marland Kitchen... Take 1 tablet by mouth once a day    Triamterene-hctz 37.5-25 Mg Tabs (Triamterene-hctz) .Marland Kitchen... Take 1 tablet by mouth once a day  BP today: 128/86 Prior BP: 106/60 (03/13/2010)  Labs Reviewed: K+: 4.0 (06/22/2010) Creat: : 0.59 (06/22/2010)   Chol: 186 (06/22/2010)   HDL: 57 (06/22/2010)   LDL: 116 (06/22/2010)   TG: 66 (06/22/2010)  Problem # 3:   UTI (ICD-599.0) Assessment: Comment Only  Her updated medication list for this problem includes:    Ciprofloxacin Hcl 500 Mg Tabs (Ciprofloxacin hcl) ..... One tab by mouth two times a day  Encouraged to push clear liquids, get enough rest, and take acetaminophen as needed. To be seen in 10 days if no improvement, sooner if worse.  Complete Medication List: 1)  Bayer Aspirin 325 Mg Tabs (Aspirin) .... One tab by mouth once daily 2)  Oscal 500/200 D-3 500-200 Mg-unit Tabs (Calcium-vitamin d) .... One tab by mouth two times a day 3)  Norvasc 5 Mg Tabs (Amlodipine besylate) .... Take 1 tab by mouth at bedtime 4)  Klor-con M20 20 Meq Cr-tabs (Potassium chloride crys cr) .... Take 1 tablet by mouth once a day 5)  Hydrocodone-acetaminophen 5-500 Mg Tabs (Hydrocodone-acetaminophen) .... One tab q 4 hrs as needed 6)  Lovastatin 40 Mg Tabs (Lovastatin) .... Take 1 tab by mouth at bedtime 7)  Furosemide 40 Mg Tabs (Furosemide) .... Take 1 tablet by mouth once a day 8)  Nabumetone 500 Mg Tabs (Nabumetone) .Marland Kitchen.. 1 by mouth two times a day 9)  Triamterene-hctz 37.5-25 Mg Tabs (Triamterene-hctz) .... Take 1 tablet by mouth once a day 10)  Methocarbamol 500 Mg Tabs (Methocarbamol) .Marland Kitchen.. 1 by mouth every 6 hours as needed spasms 11)  Ciprofloxacin Hcl 500 Mg Tabs (Ciprofloxacin hcl) .... One tab by mouth two times a day 12)  Fluconazole 150 Mg Tabs (Fluconazole) .... One tab by mouth once daily  Other Orders: Urinalysis ([id number]) T-Culture, Urine ([id number])  Patient Instructions: 1)  Please schedule a follow-up appointment in 4 months. 2)  It is important that you exercise regularly at least 20 minutes 5 times a week. If you develop chest pain, have severe difficulty breathing, or feel very tired , stop exercising immediately and seek medical attention. 3)  You need to lose weight. Consider a lower calorie diet and regular exercise.  4)  Pls start regular exercise, volunteering and change your  eating 5)  PLs stop drinking sweet drinks 6)  Pls get rid of/give away the candy 7)  Your urine will be checked today if infected med will be sent in Prescriptions: FLUCONAZOLE 150 MG TABS (FLUCONAZOLE) one tab by mouth once daily  #2 x 0   Entered by:   Adella Hare LPN   Authorized by:   Syliva Overman MD   Signed by:   Adella Hare LPN on 13/12/6576   Method used:   Electronically to        [facility]* (retail)       [address]       [place], [place]  [address]       Ph: [phone]       Fax: [phone]   RxID:   [id number] CIPROFLOXACIN HCL 500 MG TABS (CIPROFLOXACIN HCL) one tab by mouth two  times a day  #10 x 0   Entered by:   Adella Hare LPN   Authorized by:   Syliva Overman MD   Signed by:   Adella Hare LPN on 40/34/7425   Method used:   Electronically to        [facility] (retail)       [address] [place], [place]  [address]       Ph: [phone]       Fax: [phone]   RxID:   [id number]    Orders Added: 1)  Est. Patient Level III [25427] 2)  Urinalysis [81003-65000] 3)  T-Culture, Urine [[id number]]    Laboratory Results   Urine Tests  Date/Time Received: June 25, 2010 12:18 PM  Date/Time Reported: June 25, 2010 12:18 PM   Routine Urinalysis   Color: yellow Appearance: Clear Glucose: negative   (Normal Range: Negative) Bilirubin: negative   (Normal Range: Negative) Ketone: negative   (Normal Range: Negative) Spec. Gravity: 1.020   (Normal Range: 1.003-1.035) Blood: trace-intact   (Normal Range: Negative) pH: 6.5   (Normal Range: 5.0-8.0) Protein: negative   (Normal Range: Negative) Urobilinogen: 0.2   (Normal Range: 0-1) Nitrite: negative   (Normal Range: Negative) Leukocyte Esterace: small   (Normal Range: Negative)

## 2010-07-20 ENCOUNTER — Encounter: Payer: Self-pay | Admitting: Orthopedic Surgery

## 2010-07-24 ENCOUNTER — Ambulatory Visit (INDEPENDENT_AMBULATORY_CARE_PROVIDER_SITE_OTHER): Payer: Self-pay | Admitting: Orthopedic Surgery

## 2010-07-24 ENCOUNTER — Ambulatory Visit: Payer: Self-pay | Admitting: Orthopedic Surgery

## 2010-07-24 ENCOUNTER — Encounter: Payer: Self-pay | Admitting: Orthopedic Surgery

## 2010-07-24 DIAGNOSIS — M1710 Unilateral primary osteoarthritis, unspecified knee: Secondary | ICD-10-CM

## 2010-07-24 DIAGNOSIS — IMO0002 Reserved for concepts with insufficient information to code with codable children: Secondary | ICD-10-CM

## 2010-07-27 ENCOUNTER — Encounter: Payer: Self-pay | Admitting: Orthopedic Surgery

## 2010-08-01 ENCOUNTER — Ambulatory Visit (INDEPENDENT_AMBULATORY_CARE_PROVIDER_SITE_OTHER): Payer: Self-pay

## 2010-08-01 ENCOUNTER — Encounter: Payer: Self-pay | Admitting: Family Medicine

## 2010-08-01 DIAGNOSIS — Z111 Encounter for screening for respiratory tuberculosis: Secondary | ICD-10-CM

## 2010-08-02 NOTE — Letter (Signed)
Summary: [facility] financial discount 12/20/09-12/21/10  [facility] financial discount 12/20/09-12/21/10   Imported By: Cammie Sickle 07/25/2010 11:09:12  _____________________________________________________________________  External Attachment:    Type:   Image     Comment:   External Document

## 2010-08-02 NOTE — Assessment & Plan Note (Signed)
Summary: annual recheck rt tka/needs xrays/[facility]   Visit Type:  Follow-up Primary Provider:  Syliva Overman MD  CC:  right knee replacement.  History of Present Illness: She is a 65 years old woman is one year after a [employer] RIGHT total knee arthroplasty.  She is scheduled for x-rays today.  Procedure  TKA right knee [07/11/2009]  Medication :  Hydrocodone 5 takes every 4 hrs, Nabumetome.  She is doing well.  She would like an injection in the LEFT knee her knee pain.  Allergies: 1)  ! Phenergan  Physical Exam  Additional Exam:  LEFT knee evaluation tender medial compartment.  Normal strength.  Joint is stable.  No swelling.  Strength is normal.     Impression & Recommendations:  Problem # 1:  DEGENERATIVE JOINT DISEASE, KNEES, BILATERAL (ICD-715.96)  Separate and Identifiable X-Ray report      RIGHT knee  The  alignment was normal, PTF reduced without tilt or subluxation, no evidence of loosening.  IMPRESSION: normal appearance of the implant   Her updated medication list for this problem includes:    Bayer Aspirin 325 Mg Tabs (Aspirin) ..... One tab by mouth once daily    Hydrocodone-acetaminophen 5-500 Mg Tabs (Hydrocodone-acetaminophen) ..... One tab q 4 hrs as needed    Nabumetone 500 Mg Tabs (Nabumetone) .Marland Kitchen... 1 by mouth two times a day    Methocarbamol 500 Mg Tabs (Methocarbamol) .Marland Kitchen... 1 by mouth every 6 hours as needed spasms  Orders: Dental Referral (Dentist) Est. Patient Level III (09811) Joint Aspirate / Injection, Large (20610) Depo- Medrol 40mg  (J1030) LEFT knee Verbal consent was obtained. The left knee was prepped with alcohol and ethyl chloride. 1 cc of depomedrol 40mg /cc and 4 cc of lidocaine 1% was injected. there were no complications.  Patient Instructions: 1)  1 year for right knee xrays  2)  left knee 3)  You have received an injection of cortisone today. You may experience increased pain at the injection site. Apply ice  pack to the area for 20 minutes every 2 hours and take 2 xtra strength tylenol every 8 hours. This increased pain will usually resolve in 24 hours. The injection will take effect in 3-10 days.  4)  If you decide to have surgery fro your left knee: call us for visit and xrays    Orders Added: 1)  Dental Referral [Dentist] 2)  Est. Patient Level III [91478] 3)  Joint Aspirate / Injection, Large [20610] 4)  Depo- Medrol 40mg  [J1030]

## 2010-08-03 ENCOUNTER — Encounter (INDEPENDENT_AMBULATORY_CARE_PROVIDER_SITE_OTHER): Payer: Self-pay

## 2010-08-07 NOTE — Letter (Signed)
Summary: TB Screening Letter  [facility]  [address]   [place], [place] [address]   Phone: [phone]  Fax: [phone]                                                 RECORD OF TUBERCULOSIS SCREENING  Patricia Fuentes August 03, 2010 [address] [place], [place]  [address]  [place] 08/11/1945       This is to certify that the above-named person had a tuberculin skin test on 08/01/2010, which was read as 0 mm. NEGATIVE   Results read by: Everitt Amber, LPN               Date: 08/03/2010

## 2010-08-07 NOTE — Assessment & Plan Note (Signed)
Summary: tb  Nurse Visit   Allergies: 1)  ! Phenergan  Immunizations Administered:  PPD Skin Test:    Vaccine Type: PPD    Site: right forearm    Mfr: Sanofi Pasteur    Dose: 0.1 ml    Route: ID    Given by: Adella Hare LPN    Exp. Date: 09/19/2012    Lot #: [id number]  Orders Added: 1)  TB Skin Test [86580] 2)  Admin 1st Vaccine [90471]  Appended Document: tb    Clinical Lists Changes  Observations: Added new observation of TB PPDRESULT: negative (08/03/2010 11:07) Added new observation of PPD RESULT: < 5mm (08/03/2010 11:07) Added new observation of TB-PPD RDDTE: 08/03/2010 (08/03/2010 11:07)         PPD Results    Date of reading: 08/03/2010    Results: < 5mm    Interpretation: negative

## 2010-08-07 NOTE — Letter (Signed)
Summary: Out of Work  [facility]  [address]  [place], [place] [address]   Phone: [phone]  Fax: [phone]    July 27, 2010   Employee:  Patricia Fuentes    To Whom It May Concern:  The above named patient/employee had a medical visit in our office on July 24, 2010.  For Medical reasons, please continue out of work status for the following dates:  Start:    06/19/2009  End:   05/27/2011 or until further notice  If you need additional information, please feel free to contact our office.         Sincerely,    Terrance Mass, MD

## 2010-08-08 LAB — COMPREHENSIVE METABOLIC PANEL
ALT: 15 U/L (ref 0–35)
AST: 19 U/L (ref 0–37)
Albumin: 3.4 g/dL — ABNORMAL LOW (ref 3.5–5.2)
Alkaline Phosphatase: 84 U/L (ref 39–117)
BUN: 9 mg/dL (ref 6–23)
CO2: 35 mEq/L — ABNORMAL HIGH (ref 19–32)
Calcium: 9 mg/dL (ref 8.4–10.5)
Chloride: 99 mEq/L (ref 96–112)
Creatinine, Ser: 0.81 mg/dL (ref 0.4–1.2)
GFR calc non Af Amer: >60 mL/min (ref >60)
Glucose, Bld: 111 mg/dL — ABNORMAL HIGH (ref 70–99)
Total Protein: 7.9 g/dL (ref 6.0–8.3)

## 2010-08-08 LAB — CBC
MCH: 26.5 pg (ref 26.0–34.0)
MCV: 82.5 fL (ref 78.0–100.0)
WBC: 8 10*3/uL (ref 4.0–10.5)

## 2010-08-08 LAB — DIFFERENTIAL
Eosinophils Absolute: 0.2 10*3/uL (ref 0.0–0.7)
Lymphocytes Relative: 20 % (ref 12–46)
Monocytes Absolute: 0.8 10*3/uL (ref 0.1–1.0)
Monocytes Relative: 10 % (ref 3–12)
Neutro Abs: 5.4 10*3/uL (ref 1.7–7.7)
Neutrophils Relative %: 67 % (ref 43–77)

## 2010-08-08 LAB — APTT: aPTT: 31 seconds (ref 24–37)

## 2010-08-15 LAB — DIFFERENTIAL
Basophils Absolute: 0 10*3/uL (ref 0.0–0.1)
Basophils Absolute: 0 10*3/uL (ref 0.0–0.1)
Basophils Relative: 0 % (ref 0–1)
Basophils Relative: 0 % (ref 0–1)
Eosinophils Absolute: 0.2 10*3/uL (ref 0.0–0.7)
Eosinophils Relative: 2 % (ref 0–5)
Lymphocytes Relative: 10 % — ABNORMAL LOW (ref 12–46)
Lymphocytes Relative: 10 % — ABNORMAL LOW (ref 12–46)
Lymphocytes Relative: 30 % (ref 12–46)
Lymphs Abs: 1.2 10*3/uL (ref 0.7–4.0)
Lymphs Abs: 1.8 10*3/uL (ref 0.7–4.0)
Monocytes Absolute: 0.7 10*3/uL (ref 0.1–1.0)
Monocytes Absolute: 1 10*3/uL (ref 0.1–1.0)
Monocytes Relative: 11 % (ref 3–12)
Monocytes Relative: 12 % (ref 3–12)
Monocytes Relative: 12 % (ref 3–12)
Neutro Abs: 3.3 10*3/uL (ref 1.7–7.7)
Neutro Abs: 8.5 10*3/uL — ABNORMAL HIGH (ref 1.7–7.7)
Neutrophils Relative %: 54 % (ref 43–77)
Neutrophils Relative %: 74 % (ref 43–77)

## 2010-08-15 LAB — BASIC METABOLIC PANEL
BUN: 11 mg/dL (ref 6–23)
BUN: 14 mg/dL (ref 6–23)
BUN: 4 mg/dL — ABNORMAL LOW (ref 6–23)
CO2: 31 mEq/L (ref 19–32)
CO2: 32 mEq/L (ref 19–32)
CO2: 35 mEq/L — ABNORMAL HIGH (ref 19–32)
Calcium: 8.5 mg/dL (ref 8.4–10.5)
Calcium: 8.7 mg/dL (ref 8.4–10.5)
Chloride: 100 mEq/L (ref 96–112)
Chloride: 101 mEq/L (ref 96–112)
Chloride: 93 mEq/L — ABNORMAL LOW (ref 96–112)
Creatinine, Ser: 0.73 mg/dL (ref 0.4–1.2)
Creatinine, Ser: 0.81 mg/dL (ref 0.4–1.2)
GFR calc Af Amer: >60 mL/min (ref >60)
GFR calc Af Amer: >60 mL/min (ref >60)
GFR calc Af Amer: >60 mL/min (ref >60)
GFR calc non Af Amer: >60 mL/min (ref >60)
GFR calc non Af Amer: >60 mL/min (ref >60)
Glucose, Bld: 129 mg/dL — ABNORMAL HIGH (ref 70–99)
Potassium: 3.7 mEq/L (ref 3.5–5.1)
Potassium: 3.9 mEq/L (ref 3.5–5.1)
Potassium: 4 mEq/L (ref 3.5–5.1)
Sodium: 140 mEq/L (ref 135–145)

## 2010-08-15 LAB — CROSSMATCH: Antibody Screen: NEGATIVE

## 2010-08-15 LAB — CBC
HCT: 26.8 % — ABNORMAL LOW (ref 36.0–46.0)
Hemoglobin: 12.3 g/dL (ref 12.0–15.0)
Hemoglobin: 9.6 g/dL — ABNORMAL LOW (ref 12.0–15.0)
MCHC: 32.2 g/dL (ref 30.0–36.0)
MCHC: 32.6 g/dL (ref 30.0–36.0)
MCHC: 33.2 g/dL (ref 30.0–36.0)
MCV: 85.9 fL (ref 78.0–100.0)
MCV: 86.4 fL (ref 78.0–100.0)
Platelets: 121 10*3/uL — ABNORMAL LOW (ref 150–400)
Platelets: 132 10*3/uL — ABNORMAL LOW (ref 150–400)
Platelets: 197 10*3/uL (ref 150–400)
RBC: 3.06 MIL/uL — ABNORMAL LOW (ref 3.87–5.11)
RBC: 3.45 MIL/uL — ABNORMAL LOW (ref 3.87–5.11)
RDW: 13.7 % (ref 11.5–15.5)
WBC: 11.5 10*3/uL — ABNORMAL HIGH (ref 4.0–10.5)

## 2010-08-15 LAB — PROTIME-INR: Prothrombin Time: 13.4 seconds (ref 11.6–15.2)

## 2010-08-23 NOTE — Letter (Signed)
Summary: Medical record request Disab Determin  Medical record request Disab Determin   Imported By: Cammie Sickle 08/13/2010 13:30:41  _____________________________________________________________________  External Attachment:    Type:   Image     Comment:   External Document

## 2010-08-23 NOTE — Letter (Signed)
Summary: [facility] Disab form  [facility] Disab form   Imported By: Cammie Sickle 08/13/2010 20:17:38  _____________________________________________________________________  External Attachment:    Type:   Image     Comment:   External Document

## 2010-10-09 NOTE — Assessment & Plan Note (Signed)
[facility]                       [facility] OFFICE NOTE   Patricia Fuentes                    MRN:          [id number]  DATE:12/30/2008                            DOB:          1946/05/04    I was asked by Dr. Syliva Overman to consult on Patricia Fuentes  lower extremity edema.   Patricia Fuentes is a very pleasant 65 year old married African American  female, employee of [employer] at [employer], who comes  today with the above concern.   She is followed closely by Dr. Syliva Overman, who put her on  furosemide about 3 or 4 weeks ago.  Her edema is slowly improving.   She has a history of hypertension and hyperlipidemia.  Her blood  pressure has been under good control on last visit with Dr. Lodema Hong at  120/80.   She has struggled for a long time with her weight.  She stands most of  her day, but does walk quite a bit.  She says that her legs get tender  when they get swollen in the evening.   She denies any orthopnea or PND.  She has had no chest tightness or  angina.  She has had no syncope or tachy palpitations.  She denies any  true orthostatic symptoms.   PAST MEDICAL HISTORY:  She is intolerant to Phenergan.   PAST SURGICAL HISTORY:  Hysterectomy and left knee surgery.   MEDICATIONS:  1. Aspirin 325 mg a day.  2. Os-Cal b.i.d.  3. Norvasc 5 mg nightly.  4. Lovastatin 40 mg nightly.  5. Furosemide 40 mg in the morning, 20 in the evening.  6. Potassium 20 mEq b.i.d.  7. Diovan/HCTZ 320/25 daily.   SOCIAL HISTORY:  She is married.  She grew up and lives in [place].  She has 4 children.  She is a restorative NT.   She does not smoke or drink.  She does not use any illicit drugs.   FAMILY HISTORY:  Remarkable for history of cancer in her father, stroke  in her mother, who are both deceased, hypertension in 2 brothers.   REVIEW OF SYSTEMS:  She wears reading glasses.  She has upper and lower  dentures.  She has a history of bronchitis.  She has had 4 pregnancies.  She has yearly mammograms.  She has a history of left lower extremity  phlebitis.  She has a history of some varicose veins.   Rest of her review of systems are negative.   PHYSICAL EXAMINATION:  GENERAL:  She is very pleasant lady in no acute  distress.  VITAL SIGNS:  She is 5/2, weighs 263 pounds.  Her blood pressure is  120/69 in the right arm and 130/75 in the left arm, her pulse is 77 and  regular.  EKG shows sinus rhythm with PACs.  Her PR, QRS, and QTC  intervals are normal.  ST-segment is unremarkable.  HEENT:  Normal  except for being edentulous.  NECK:  Supple.  Her carotid upstrokes were equal bilaterally without  bruits.  No JVD.  Thyroid is not enlarged.  Trachea is midline.  CHEST:  Lungs are clear to auscultation and percussion.  HEART:  Poorly appreciated PMI.  Normal S1 and S2, though they are soft.  No gallop.  ABDOMEN:  Obese with good bowel sounds.  There is no obvious  abnormality, though exam was difficult.  EXTREMITIES:  1+ pretibial edema.  She is fairly tender when I press.  There is no sign of DVT.  No calf tenderness.  Pulses were present and  equal bilaterally.  No major varicose veins.  MUSCULOSKELETAL:  Some chronic arthritic changes.   ASSESSMENT:  1. Lower extremity edema secondary to obesity, standing most of the      day, though she does do a lot of walking, dietary indiscretion      which she admits to with some salt, and a history of hypertension.      We need to evaluate cardiac function particularly right-sided      pressures.  She may have some left ventricular hypertrophy and some      diastolic dysfunction just from her risk factors.  2. She has gotten a significant response to the furosemide which I      will not change.  Norvasc could be contributing, but is controlling      her blood pressure well.  I would aim for minimal edema with      symptomatic improvement in  the discomfort.  We will check an      echocardiogram to assess cardiac structure and function.  Salt      restriction as we discussed at length today.  I have also discussed      leg elevation at night.     Thomas C. Daleen Squibb, MD, [facility]  Electronically Signed    TCW/MedQ  DD: 12/30/2008  DT: 12/31/2008  Job #: [id number]   cc:   Milus Mallick. Lodema Hong, M.D.

## 2010-10-15 ENCOUNTER — Other Ambulatory Visit: Payer: Self-pay | Admitting: *Deleted

## 2010-10-15 DIAGNOSIS — R52 Pain, unspecified: Secondary | ICD-10-CM

## 2010-10-15 MED ORDER — HYDROCODONE-ACETAMINOPHEN 5-500 MG PO TABS: 1.0000 | ORAL_TABLET | ORAL | Status: AC | PRN

## 2010-10-15 MED ORDER — NABUMETONE 500 MG PO TABS: 500.0000 mg | ORAL_TABLET | Freq: Two times a day (BID) | ORAL | Status: AC

## 2010-10-16 ENCOUNTER — Encounter: Payer: Self-pay | Admitting: Orthopedic Surgery

## 2010-10-16 ENCOUNTER — Telehealth: Payer: Self-pay | Admitting: Orthopedic Surgery

## 2010-10-16 NOTE — Telephone Encounter (Signed)
Patient stopped in to request note. 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 States needs a note with restrictions for the possible job to include that she has limited bending and lifting, and limitations with standing for long periods of time.    Advised note to be done and approved by Dr. Romeo Apple.  Patient's ph# to call when ready is [phone].

## 2010-10-18 NOTE — Telephone Encounter (Signed)
Note done as per patient request and picked up by patient today, 4:35p.m.

## 2010-10-23 ENCOUNTER — Encounter: Payer: Self-pay | Admitting: Family Medicine

## 2010-10-24 ENCOUNTER — Encounter: Payer: Self-pay | Admitting: Family Medicine

## 2010-10-24 ENCOUNTER — Ambulatory Visit (INDEPENDENT_AMBULATORY_CARE_PROVIDER_SITE_OTHER): Payer: Self-pay | Admitting: Family Medicine

## 2010-10-24 VITALS — BP 120/78 | HR 85 | Resp 16 | Ht 62.0 in | Wt 273.1 lb

## 2010-10-24 DIAGNOSIS — R5381 Other malaise: Secondary | ICD-10-CM

## 2010-10-24 DIAGNOSIS — R7301 Impaired fasting glucose: Secondary | ICD-10-CM

## 2010-10-24 DIAGNOSIS — M1710 Unilateral primary osteoarthritis, unspecified knee: Secondary | ICD-10-CM

## 2010-10-24 DIAGNOSIS — R5383 Other fatigue: Secondary | ICD-10-CM

## 2010-10-24 DIAGNOSIS — N390 Urinary tract infection, site not specified: Secondary | ICD-10-CM

## 2010-10-24 DIAGNOSIS — E785 Hyperlipidemia, unspecified: Secondary | ICD-10-CM

## 2010-10-24 DIAGNOSIS — R609 Edema, unspecified: Secondary | ICD-10-CM

## 2010-10-24 DIAGNOSIS — R600 Localized edema: Secondary | ICD-10-CM

## 2010-10-24 DIAGNOSIS — I10 Essential (primary) hypertension: Secondary | ICD-10-CM

## 2010-10-24 DIAGNOSIS — IMO0002 Reserved for concepts with insufficient information to code with codable children: Secondary | ICD-10-CM

## 2010-10-24 MED ORDER — CIPROFLOXACIN HCL 500 MG PO TABS: 500.0000 mg | ORAL_TABLET | Freq: Two times a day (BID) | ORAL | Status: AC

## 2010-10-24 MED ORDER — FUROSEMIDE 10 MG/ML IJ SOLN
10.0000 mg | Freq: Once | INTRAMUSCULAR | Status: AC
  Administered 2010-10-24: 10 mg via INTRAMUSCULAR

## 2010-10-24 NOTE — Patient Instructions (Addendum)
F/u in 4 months.  You will get an injection today for leg swelling. pls continue your current medications as they are, and also reduce salt and high soduim foods, including canned foods and sodas, also elevate  Your legs when able  hBA1c today, and chem7 today.and tSH  Antibiotic sent in for presumed uTI

## 2010-10-24 NOTE — Progress Notes (Signed)
  Subjective:    Patient ID: Patricia Fuentes, female    DOB: 1945-11-18, 65 y.o.   MRN: [id number]  HPI 2 week h/o dysuria and frequency. 3 week h/o bilateral leg swelling, left greater than right She has been inconsitent in healthy lifestyle changes discussed, and has gained weight . Now concerned about becoming diabetic and plans to be much more diligent  About this  Review of Systems Denies recent fever or chills. Denies sinus pressure, nasal congestion, ear pain or sore throat. Denies chest congestion, productive cough or wheezing. Denies chest pains, palpitations, paroxysmal nocturnal dyspnea, orthopnea  Denies abdominal pain, nausea, vomiting,diarrhea or constipation.  Denies rectal bleeding or change in bowel movement. Denies headaches, seizure, numbness, or tingling. Denies depression, anxiety or insomnia. Denies skin break down or rash.        Objective:   Physical Exam Patient alert and oriented and in no Cardiopulmonary distress.  HEENT: No facial asymmetry, EOMI, no sinus tenderness, TM's clear, Oropharynx pink and moist.  Neck supple no adenopathy.  Chest: Clear to auscultation bilaterally.  CVS: S1, S2 no murmurs, no S3.Bilateral one plus pitting edema  ABD: Soft non tender. Bowel sounds normal.  Ext: No edema  MS: decreased  ROM spine, shoulders, hips and knees.  Skin: Intact, no ulcerations or rash noted.  Psych: Good eye contact, normal affect. Memory intact not anxious or depressed appearing.  CNS: CN 2-12 intact, power, tone and sensation normal throughout.       Assessment & Plan:

## 2010-10-27 LAB — BASIC METABOLIC PANEL
BUN: 11 mg/dL (ref 6–23)
Chloride: 101 mEq/L (ref 96–112)
Glucose, Bld: 102 mg/dL — ABNORMAL HIGH (ref 70–99)
Potassium: 4 mEq/L (ref 3.5–5.3)
Sodium: 141 mEq/L (ref 135–145)

## 2010-10-27 LAB — HEMOGLOBIN A1C: Hgb A1c MFr Bld: 6.3 % — ABNORMAL HIGH (ref <5.7)

## 2010-10-31 ENCOUNTER — Telehealth: Payer: Self-pay | Admitting: Family Medicine

## 2010-10-31 NOTE — Telephone Encounter (Signed)
Called patient, left message.

## 2010-11-04 NOTE — Assessment & Plan Note (Signed)
Abnormal UA, will treat presumptively

## 2010-11-04 NOTE — Assessment & Plan Note (Signed)
Controlled, no change in medication  

## 2010-11-04 NOTE — Assessment & Plan Note (Signed)
Low fat diet discussed and encouraged 

## 2010-11-04 NOTE — Assessment & Plan Note (Signed)
Deteriorated, weight loss encouraged

## 2010-11-05 NOTE — Telephone Encounter (Signed)
Patient was advised of results 11/02/10 by Merry Proud

## 2010-11-13 ENCOUNTER — Other Ambulatory Visit: Payer: Self-pay | Admitting: Family Medicine

## 2010-11-18 ENCOUNTER — Other Ambulatory Visit: Payer: Self-pay | Admitting: Family Medicine

## 2010-12-13 ENCOUNTER — Encounter: Payer: Self-pay | Admitting: Orthopedic Surgery

## 2010-12-13 ENCOUNTER — Ambulatory Visit: Payer: Self-pay | Admitting: Orthopedic Surgery

## 2011-02-22 ENCOUNTER — Encounter: Payer: Self-pay | Admitting: Family Medicine

## 2011-03-13 ENCOUNTER — Other Ambulatory Visit: Payer: Self-pay | Admitting: Family Medicine

## 2011-03-19 ENCOUNTER — Encounter: Payer: Self-pay | Admitting: Family Medicine

## 2011-03-20 ENCOUNTER — Encounter: Payer: Self-pay | Admitting: Family Medicine

## 2011-03-20 ENCOUNTER — Ambulatory Visit (INDEPENDENT_AMBULATORY_CARE_PROVIDER_SITE_OTHER): Payer: Self-pay | Admitting: Family Medicine

## 2011-03-20 VITALS — BP 140/90 | HR 64 | Resp 16 | Ht 62.0 in | Wt 269.1 lb

## 2011-03-20 DIAGNOSIS — R7309 Other abnormal glucose: Secondary | ICD-10-CM

## 2011-03-20 DIAGNOSIS — R7303 Prediabetes: Secondary | ICD-10-CM

## 2011-03-20 DIAGNOSIS — I10 Essential (primary) hypertension: Secondary | ICD-10-CM

## 2011-03-20 DIAGNOSIS — E785 Hyperlipidemia, unspecified: Secondary | ICD-10-CM

## 2011-03-20 DIAGNOSIS — E669 Obesity, unspecified: Secondary | ICD-10-CM

## 2011-03-20 MED ORDER — LOVASTATIN 40 MG PO TABS: 40.0000 mg | ORAL_TABLET | Freq: Every day | ORAL | Status: DC

## 2011-03-20 MED ORDER — AMLODIPINE BESYLATE 5 MG PO TABS: 5.0000 mg | ORAL_TABLET | Freq: Every day | ORAL | Status: DC

## 2011-03-20 NOTE — Patient Instructions (Signed)
F/u in 6 months  Please get the flu vaccine.  hBA1C as soon as possible  No med changes

## 2011-04-01 NOTE — Assessment & Plan Note (Addendum)
Uncontrolled , no med change at this time 

## 2011-04-01 NOTE — Progress Notes (Signed)
  Subjective:    Patient ID: Patricia Fuentes, female    DOB: November 20, 1945, 65 y.o.   MRN: [id number]  HPI The PT is here for follow up and re-evaluation of chronic medical conditions, medication management and review of any available recent lab and radiology data.  Preventive health is updated, specifically  Cancer screening and Immunization.   Questions or concerns regarding consultations or procedures which the PT has had in the interim are  addressed. The PT denies any adverse reactions to current medications since the last visit.  There are no new concerns.  There are no specific complaints       Review of Systems Denies recent fever or chills. Denies sinus pressure, nasal congestion, ear pain or sore throat. Denies chest congestion, productive cough or wheezing. Denies chest pains, palpitations and leg swelling Denies abdominal pain, nausea, vomiting,diarrhea or constipation.   Denies dysuria, frequency, hesitancy or incontinence. Chronic joint pain, and limitation in mobility. Denies headaches, seizures, numbness, or tingling. Denies depression, anxiety or insomnia. Denies skin break down or rash.        Objective:   Physical Exam Patient alert and oriented and in no cardiopulmonary distress.  HEENT: No facial asymmetry, EOMI, no sinus tenderness,  oropharynx pink and moist.  Neck supple no adenopathy.  Chest: Clear to auscultation bilaterally.  CVS: S1, S2 no murmurs, no S3.  ABD: Soft non tender. Bowel sounds normal.  Ext: No edema  [id number] though Adequate ROM spine, shoulders, hips and knees.  Skin: Intact, no ulcerations or rash noted.  Psych: Good eye contact, normal affect. Memory intact not anxious or depressed appearing.  CNS: CN 2-12 intact, power, tone and sensation normal throughout.        Assessment & Plan:

## 2011-04-01 NOTE — Assessment & Plan Note (Signed)
Hyperlipidemia:Low fat diet discussed and encouraged.  No medication at this time

## 2011-04-01 NOTE — Assessment & Plan Note (Signed)
Improved. Pt applauded on succesful weight loss through lifestyle change, and encouraged to continue same. Weight loss goal set for the next several months.  

## 2011-07-19 LAB — HEMOGLOBIN A1C
Hgb A1c MFr Bld: 5.9 % — ABNORMAL HIGH (ref <5.7)
Mean Plasma Glucose: 123 mg/dL — ABNORMAL HIGH (ref <117)

## 2011-07-26 ENCOUNTER — Other Ambulatory Visit: Payer: Self-pay | Admitting: Family Medicine

## 2011-08-28 ENCOUNTER — Encounter: Payer: Self-pay | Admitting: Family Medicine

## 2011-08-28 ENCOUNTER — Ambulatory Visit: Payer: Self-pay | Admitting: Family Medicine

## 2011-11-21 ENCOUNTER — Other Ambulatory Visit: Payer: Self-pay | Admitting: Family Medicine

## 2012-02-06 ENCOUNTER — Ambulatory Visit: Payer: Self-pay | Admitting: Family Medicine

## 2012-02-06 ENCOUNTER — Encounter: Payer: Self-pay | Admitting: Family Medicine

## 2012-02-11 ENCOUNTER — Other Ambulatory Visit: Payer: Self-pay | Admitting: Family Medicine

## 2012-05-31 ENCOUNTER — Encounter (HOSPITAL_COMMUNITY): Payer: Self-pay | Admitting: *Deleted

## 2012-05-31 ENCOUNTER — Emergency Department (HOSPITAL_COMMUNITY): Payer: Medicare Other

## 2012-05-31 ENCOUNTER — Emergency Department (HOSPITAL_COMMUNITY)
Admission: EM | Admit: 2012-05-31 | Discharge: 2012-05-31 | Disposition: A | Discharge status: Home / Self Care | Payer: Medicare Other | Attending: Emergency Medicine | Admitting: Emergency Medicine

## 2012-05-31 DIAGNOSIS — Z8739 Personal history of other diseases of the musculoskeletal system and connective tissue: Secondary | ICD-10-CM | POA: Insufficient documentation

## 2012-05-31 DIAGNOSIS — E669 Obesity, unspecified: Secondary | ICD-10-CM | POA: Insufficient documentation

## 2012-05-31 DIAGNOSIS — M6281 Muscle weakness (generalized): Secondary | ICD-10-CM | POA: Insufficient documentation

## 2012-05-31 DIAGNOSIS — E785 Hyperlipidemia, unspecified: Secondary | ICD-10-CM | POA: Insufficient documentation

## 2012-05-31 DIAGNOSIS — IMO0002 Reserved for concepts with insufficient information to code with codable children: Secondary | ICD-10-CM | POA: Insufficient documentation

## 2012-05-31 DIAGNOSIS — Z6841 Body Mass Index (BMI) 40.0 and over, adult: Secondary | ICD-10-CM | POA: Insufficient documentation

## 2012-05-31 DIAGNOSIS — Z79899 Other long term (current) drug therapy: Secondary | ICD-10-CM | POA: Insufficient documentation

## 2012-05-31 DIAGNOSIS — R51 Headache: Secondary | ICD-10-CM | POA: Insufficient documentation

## 2012-05-31 DIAGNOSIS — I10 Essential (primary) hypertension: Secondary | ICD-10-CM | POA: Insufficient documentation

## 2012-05-31 DIAGNOSIS — M1710 Unilateral primary osteoarthritis, unspecified knee: Secondary | ICD-10-CM | POA: Insufficient documentation

## 2012-05-31 DIAGNOSIS — J40 Bronchitis, not specified as acute or chronic: Secondary | ICD-10-CM | POA: Insufficient documentation

## 2012-05-31 MED ORDER — SULFAMETHOXAZOLE-TRIMETHOPRIM 800-160 MG PO TABS: 1.0000 | ORAL_TABLET | Freq: Two times a day (BID) | ORAL | Status: DC

## 2012-05-31 NOTE — ED Provider Notes (Signed)
History     CSN: [id number]  Arrival date & time 05/31/12  1314   First MD Initiated Contact with Patient 05/31/12 1412      Chief Complaint  Patient presents with  . Cough    (Consider location/radiation/quality/duration/timing/severity/associated sxs/prior treatment) Patient is a 67 y.o. female presenting with weakness. The history is provided by the patient (pt complains of of cough ). No language interpreter was used.  Weakness The primary symptoms include headaches. Primary symptoms do not include seizures. The symptoms began 3 to 5 days ago. The symptoms are unchanged. The neurological symptoms are multifocal. Context: nothing.  The headache is associated with weakness.  Additional symptoms include weakness. Additional symptoms do not include hallucinations.    Past Medical History  Diagnosis Date  . Back pain with radiation   . Hyperlipidemia   . Osteoarthrosis, unspecified whether generalized or localized, lower leg   . Obesity, unspecified   . Hypertension     Past Surgical History  Procedure Date  . Total abdominal hysterectomy   . Knee arthroscopy left  . Total knee arthroplasty right 2011    Family History  Problem Relation Age of Onset  . Stroke    . Heart disease      History  Substance Use Topics  . Smoking status: Never Smoker   . Smokeless tobacco: Not on file  . Alcohol Use: No    OB History    Grav Para Term Preterm Abortions TAB SAB Ect Mult Living                  Review of Systems  Constitutional: Negative for fatigue.  HENT: Negative for congestion, sinus pressure and ear discharge.   Eyes: Negative for discharge.  Respiratory: Positive for cough.   Cardiovascular: Negative for chest pain.  Gastrointestinal: Negative for abdominal pain and diarrhea.  Genitourinary: Negative for frequency and hematuria.  Musculoskeletal: Negative for back pain.  Skin: Negative for rash.  Neurological: Positive for weakness and headaches. Negative  for seizures.  Hematological: Negative.   Psychiatric/Behavioral: Negative for hallucinations.    Allergies  Promethazine hcl  Home Medications   Current Outpatient Rx  Name  Route  Sig  Dispense  Refill  . AMLODIPINE BESYLATE 5 MG PO TABS   Oral   Take 1 tablet (5 mg total) by mouth daily.   90 tablet   1   . AMLODIPINE BESYLATE 5 MG PO TABS      TAKE ONE TABLET BY MOUTH EVERY DAY FOR BLOOD PRESSURE   90 tablet   1   . KLOR-CON M20 20 MEQ PO TBCR      TAKE 1 TABLET BY MOUTH ONCE A DAY   30 tablet   1   . LOVASTATIN 40 MG PO TABS   Oral   Take 1 tablet (40 mg total) by mouth at bedtime.   90 tablet   1     BP 130/54  Pulse 75  Temp 98.5 F (36.9 C) (Oral)  Resp 20  Ht 5\' 2"  (1.575 m)  Wt 260 lb (117.935 kg)  BMI 47.55 kg/m2  SpO2 97%  Physical Exam  Constitutional: She is oriented to person, place, and time. She appears well-developed.  HENT:  Head: Normocephalic and atraumatic.  Eyes: Conjunctivae normal and EOM are normal. No scleral icterus.  Neck: Neck supple. No thyromegaly present.  Cardiovascular: Normal rate and regular rhythm.  Exam reveals no gallop and no friction rub.   No murmur heard.  Pulmonary/Chest: No stridor. She has no wheezes. She has no rales. She exhibits no tenderness.  Abdominal: She exhibits no distension. There is no tenderness. There is no rebound.  Musculoskeletal: Normal range of motion. She exhibits no edema.  Lymphadenopathy:    She has no cervical adenopathy.  Neurological: She is oriented to person, place, and time. Coordination normal.  Skin: No rash noted. No erythema.  Psychiatric: She has a normal mood and affect. Her behavior is normal.    ED Course  Procedures (including critical care time)  Labs Reviewed - No data to display Dg Chest 2 View  05/31/2012  *RADIOLOGY REPORT*  Clinical Data: Productive cough and congestion.  CHEST - 2 VIEW  Comparison: 08/03/2006  Findings: Mild to moderate cardiomegaly noted.   Hilar prominence is present, and adenopathy is not excluded.  No paratracheal adenopathy noted.  Airway thickening may reflect bronchitis or reactive airways disease.  No airspace opacity is identified to suggest bacterial pneumonia pattern.  No pleural effusion identified.  Thoracic spondylosis is present.  Tortuosity of the thoracic aorta is noted.  IMPRESSION: 1.  Cardiomegaly.  2.  Hilar prominence could be vascular but I cannot exclude hilar adenopathy.  Consider chest CT with contrast, or follow-up chest radiography in 4-6 weeks time.  3. Airway thickening may reflect bronchitis or reactive airways disease.  No airspace opacity is identified to suggest bacterial pneumonia pattern.  4.  Tortuous thoracic aorta.   Original Report Authenticated By: Gaylyn Rong, M.D.      No diagnosis found.    MDM          Benny Lennert, MD 05/31/12 [phone]

## 2012-05-31 NOTE — ED Notes (Signed)
Cough, generalized weakness, and decreased appetite x 2 days.

## 2012-10-20 ENCOUNTER — Ambulatory Visit (INDEPENDENT_AMBULATORY_CARE_PROVIDER_SITE_OTHER): Payer: Medicare Other | Admitting: Family Medicine

## 2012-10-20 ENCOUNTER — Other Ambulatory Visit: Payer: Self-pay | Admitting: Family Medicine

## 2012-10-20 ENCOUNTER — Encounter: Payer: Self-pay | Admitting: Family Medicine

## 2012-10-20 VITALS — BP 160/100 | HR 76 | Resp 18 | Ht 62.0 in | Wt 257.0 lb

## 2012-10-20 DIAGNOSIS — I10 Essential (primary) hypertension: Secondary | ICD-10-CM

## 2012-10-20 DIAGNOSIS — R7309 Other abnormal glucose: Secondary | ICD-10-CM

## 2012-10-20 DIAGNOSIS — R7303 Prediabetes: Secondary | ICD-10-CM | POA: Insufficient documentation

## 2012-10-20 DIAGNOSIS — E669 Obesity, unspecified: Secondary | ICD-10-CM

## 2012-10-20 DIAGNOSIS — J42 Unspecified chronic bronchitis: Secondary | ICD-10-CM | POA: Insufficient documentation

## 2012-10-20 DIAGNOSIS — E8881 Metabolic syndrome: Secondary | ICD-10-CM | POA: Insufficient documentation

## 2012-10-20 DIAGNOSIS — E785 Hyperlipidemia, unspecified: Secondary | ICD-10-CM

## 2012-10-20 MED ORDER — PENICILLIN V POTASSIUM 500 MG PO TABS: 500.0000 mg | ORAL_TABLET | Freq: Three times a day (TID) | ORAL | Status: DC

## 2012-10-20 MED ORDER — TRIAMTERENE-HCTZ 37.5-25 MG PO TABS: 1.0000 | ORAL_TABLET | Freq: Every day | ORAL | Status: DC

## 2012-10-20 MED ORDER — BENZONATATE 100 MG PO CAPS: 100.0000 mg | ORAL_CAPSULE | Freq: Four times a day (QID) | ORAL | Status: DC | PRN

## 2012-10-20 NOTE — Assessment & Plan Note (Addendum)
Unchanged. Patient re-educated about  the importance of commitment to a  minimum of 150 minutes of exercise per week. The importance of healthy food choices with portion control discussed. Encouraged to start a food diary, count calories and to consider  joining a support group. Sample diet sheets offered. Goals set by the patient for the next several months.    

## 2012-10-20 NOTE — Assessment & Plan Note (Signed)
Patient educated about the importance of limiting  Carbohydrate intake , the need to commit to daily physical activity for a minimum of 30 minutes , and to commit weight loss. The fact that changes in all these areas will reduce or eliminate all together the development of diabetes is stressed.    

## 2012-10-20 NOTE — Assessment & Plan Note (Signed)
Pt had abnormal CXR in January will prescribe antibiotics and decongestants, will need to repeat CXR in 5 weeks before she returns for follow up

## 2012-10-20 NOTE — Patient Instructions (Signed)
F/u July 10 or after.Important that you keep appointment  You are prescribed decongestants and antibiotics for chronic cough.  You NEED repeat CXR on July 8, then appointment after this to to f/u cough and the abnormal cXR that you had in January when seen in the ED.  Fasting labs cbc, cmp, lipid, HBA1C and TSH as soon as possible, past due  Please schedule your mammogram this is past due.   Blood pressure is high, continue norvasc, add maxzide 25mg  one daily, continue potassium since you have used this in the past

## 2012-10-20 NOTE — Assessment & Plan Note (Signed)
Pt needs to work on weight loss to help to promote improved health and reduce cardiovascular risk

## 2012-10-20 NOTE — Progress Notes (Signed)
  Subjective:    Patient ID: Patricia Fuentes, female    DOB: [birth year], 67 y.o.   MRN: [id number]  HPI The PT is here for follow up and re-evaluation of chronic medical conditions, medication management and review of any available recent lab and radiology data.  Preventive health is updated, specifically  Cancer screening and Immunization.  Mammogram is past due, also labs, and she has been off cholesterol medication for a while, took blood pressure pill up to yesterday States knee pain is resolved, now that she has had surgery The PT denies any adverse reactions to current medications since the last visit.  Last seen in the office approx 18 months ago, was in the ED in January with c/o cough, this persists, reports chest congestion and phlegm, has intermittent chills also    Review of Systems See HPI Denies recent fever or chills. Denies sinus pressure, nasal congestion, ear pain or sore throat.  Denies chest pains, palpitations and leg swelling Denies abdominal pain, nausea, vomiting,diarrhea or constipation.   Denies dysuria, frequency, hesitancy or incontinence.  Denies headaches, seizures, numbness, or tingling. Denies depression, anxiety or insomnia. Denies skin break down or rash.        Objective:   Physical Exam  Patient alert and oriented and in no cardiopulmonary distress.  HEENT: No facial asymmetry, EOMI, no sinus tenderness,  oropharynx pink and moist.  Neck supple no adenopathy.  Chest: Adequate air entry bilaterally , few crackles no wheezes heard CVS: S1, S2 no murmurs, no S3.  ABD: Soft non tender. Bowel sounds normal.  Ext: No edema  MS: Adequate though reduced  ROM spine, shoulders, hips and knees.  Skin: Intact, no ulcerations or rash noted.  Psych: Good eye contact, normal affect. Memory intact not anxious or depressed appearing.  CNS: CN 2-12 intact, power, tone and sensation normal throughout.       Assessment & Plan:

## 2012-10-20 NOTE — Assessment & Plan Note (Signed)
Uncontrolled add maxzide DASH diet and commitment to daily physical activity for a minimum of 30 minutes discussed and encouraged, as a part of hypertension management. The importance of attaining a healthy weight is also discussed.  

## 2012-10-20 NOTE — Assessment & Plan Note (Signed)
Hyperlipidemia:Low fat diet discussed and encouraged.  Updated lab past due  

## 2012-11-27 LAB — COMPREHENSIVE METABOLIC PANEL
AST: 11 U/L (ref 0–37)
Albumin: 3.7 g/dL (ref 3.5–5.2)
Alkaline Phosphatase: 76 U/L (ref 39–117)
BUN: 7 mg/dL (ref 6–23)
Calcium: 9 mg/dL (ref 8.4–10.5)
Chloride: 99 mEq/L (ref 96–112)
Glucose, Bld: 107 mg/dL — ABNORMAL HIGH (ref 70–99)
Potassium: 3.8 mEq/L (ref 3.5–5.3)
Sodium: 138 mEq/L (ref 135–145)
Total Protein: 7.8 g/dL (ref 6.0–8.3)

## 2012-11-27 LAB — CBC
HCT: 37.6 % (ref 36.0–46.0)
Hemoglobin: 12.6 g/dL (ref 12.0–15.0)
MCHC: 33.5 g/dL (ref 30.0–36.0)
RDW: 15.4 % (ref 11.5–15.5)
WBC: 5.2 10*3/uL (ref 4.0–10.5)

## 2012-11-27 LAB — TSH: TSH: 1.679 u[IU]/mL (ref 0.350–4.500)

## 2012-11-27 LAB — LIPID PANEL: LDL Cholesterol: 121 mg/dL — ABNORMAL HIGH (ref 0–99)

## 2012-11-27 LAB — HEMOGLOBIN A1C: Hgb A1c MFr Bld: 6.1 % — ABNORMAL HIGH (ref <5.7)

## 2012-11-30 ENCOUNTER — Ambulatory Visit (HOSPITAL_COMMUNITY)
Admission: RE | Admit: 2012-11-30 | Discharge: 2012-11-30 | Disposition: A | Discharge status: Home / Self Care | Payer: Medicare Other | Source: Ambulatory Visit | Attending: Family Medicine | Admitting: Family Medicine

## 2012-11-30 DIAGNOSIS — R059 Cough, unspecified: Secondary | ICD-10-CM | POA: Insufficient documentation

## 2012-11-30 DIAGNOSIS — J42 Unspecified chronic bronchitis: Secondary | ICD-10-CM

## 2012-11-30 DIAGNOSIS — R05 Cough: Secondary | ICD-10-CM | POA: Insufficient documentation

## 2012-12-09 ENCOUNTER — Ambulatory Visit: Payer: Medicare Other | Admitting: Family Medicine

## 2012-12-15 ENCOUNTER — Ambulatory Visit (INDEPENDENT_AMBULATORY_CARE_PROVIDER_SITE_OTHER): Payer: Medicare Other | Admitting: Family Medicine

## 2012-12-15 ENCOUNTER — Encounter: Payer: Self-pay | Admitting: Family Medicine

## 2012-12-15 VITALS — BP 150/82 | HR 71 | Resp 16 | Ht 62.0 in | Wt 256.8 lb

## 2012-12-15 DIAGNOSIS — I10 Essential (primary) hypertension: Secondary | ICD-10-CM

## 2012-12-15 DIAGNOSIS — R7303 Prediabetes: Secondary | ICD-10-CM

## 2012-12-15 DIAGNOSIS — E785 Hyperlipidemia, unspecified: Secondary | ICD-10-CM

## 2012-12-15 DIAGNOSIS — Z1239 Encounter for other screening for malignant neoplasm of breast: Secondary | ICD-10-CM

## 2012-12-15 DIAGNOSIS — M1710 Unilateral primary osteoarthritis, unspecified knee: Secondary | ICD-10-CM

## 2012-12-15 DIAGNOSIS — E669 Obesity, unspecified: Secondary | ICD-10-CM

## 2012-12-15 DIAGNOSIS — IMO0002 Reserved for concepts with insufficient information to code with codable children: Secondary | ICD-10-CM

## 2012-12-15 DIAGNOSIS — R7309 Other abnormal glucose: Secondary | ICD-10-CM

## 2012-12-15 DIAGNOSIS — E8881 Metabolic syndrome: Secondary | ICD-10-CM

## 2012-12-15 MED ORDER — ASPIRIN EC 81 MG PO TBEC: 81.0000 mg | DELAYED_RELEASE_TABLET | Freq: Every day | ORAL | Status: AC

## 2012-12-15 MED ORDER — CALCIUM CARBONATE-VITAMIN D 500-200 MG-UNIT PO TABS: 1.0000 | ORAL_TABLET | Freq: Two times a day (BID) | ORAL | Status: DC

## 2012-12-15 NOTE — Patient Instructions (Addendum)
Welcome to medicare visit in October, call if you need me before  Start aspirin 81 mg one daily for strokke risk reduction, also calcium twice daily for bone  Health  1 multivitamin once daily is recommended  Cut back on sugar and sweets, also egg yolk, butter, cheese, red meat, sugar too high , and bad cholesterol slightly elevated.    Youn are referred for a mammogram   Pneumonia vaccine today  It is important that you exercise regularly at least 30 minutes 5 times a week. If you develop chest pain, have severe difficulty breathing, or feel very tired, stop exercising immediately and seek medical attention    A healthy diet is rich in fruit, vegetables and whole grains. Poultry fish, nuts and beans are a healthy choice for protein rather then red meat. A low sodium diet and drinking 64 ounces of water daily is generally recommended. Oils and sweet should be limited. Carbohydrates especially for those who are diabetic or overweight, should be limited to 04-45 gram per meal. It is important to eat on a regular schedule, at least 3 times daily. Snacks should be primarily fruits, vegetables or nuts.

## 2012-12-20 NOTE — Assessment & Plan Note (Signed)
Explained increased risk of CVD , and the need to work o lipids, sugar and weight to reduce the risk

## 2012-12-20 NOTE — Assessment & Plan Note (Signed)
Deterioration in HBA1C Patient educated about the importance of limiting  Carbohydrate intake , the need to commit to daily physical activity for a minimum of 30 minutes , and to commit weight loss. The fact that changes in all these areas will reduce or eliminate all together the development of diabetes is stressed.

## 2012-12-20 NOTE — Assessment & Plan Note (Signed)
Unchanged, no falls though does have instability of left knee at times, safety discussed

## 2012-12-20 NOTE — Progress Notes (Signed)
  Subjective:    Patient ID: Patricia Fuentes, female    DOB: 1945-10-08, 67 y.o.   MRN: [id number]  HPI The PT is here for follow up and re-evaluation of chronic medical conditions, medication management and review of any available recent lab and radiology data.  Preventive health is updated, specifically  Cancer screening and Immunization.   . The PT denies any adverse reactions to current medications since the last visit.  There are no new concerns.  There are no specific complaints       Review of Systems See HPI Denies recent fever or chills. Denies sinus pressure, nasal congestion, ear pain or sore throat. Denies chest congestion, productive cough or wheezing. Denies chest pains, palpitations and leg swelling Denies abdominal pain, nausea, vomiting,diarrhea or constipation.   Denies dysuria, frequency, hesitancy or incontinence. Chronic back and knee pain unchanged, with some instability at times, in the left knee, has not fallen however Denies headaches, seizures, numbness, or tingling. Denies depression, anxiety or insomnia. Denies skin break down or rash.        Objective:   Physical Exam  Patient alert and oriented and in no cardiopulmonary distress.  HEENT: No facial asymmetry, EOMI, no sinus tenderness,  oropharynx pink and moist.  Neck supple no adenopathy.  Chest: Clear to auscultation bilaterally.  CVS: S1, S2 no murmurs, no S3.  ABD: Soft non tender. Bowel sounds normal.  Ext: No edema  MS: Decreased ROM lumbar  spine, , hips and knees.  Skin: Intact, no ulcerations or rash noted.  Psych: Good eye contact, normal affect. Memory intact not anxious or depressed appearing.  CNS: CN 2-12 intact, power, tone and sensation normal throughout.       Assessment & Plan:

## 2012-12-20 NOTE — Assessment & Plan Note (Signed)
Deteriorated. Patient re-educated about  the importance of commitment to a  minimum of 150 minutes of exercise per week. The importance of healthy food choices with portion control discussed. Encouraged to start a food diary, count calories and to consider  joining a support group. Sample diet sheets offered. Goals set by the patient for the next several months.    

## 2012-12-20 NOTE — Assessment & Plan Note (Signed)
Uncontrolled and deteriorated dietary change only at this time Hyperlipidemia:Low fat diet discussed and encouraged.

## 2012-12-20 NOTE — Assessment & Plan Note (Signed)
Controlled, no change in medication DASH diet and commitment to daily physical activity for a minimum of 30 minutes discussed and encouraged, as a part of hypertension management. The importance of attaining a healthy weight is also discussed.  

## 2012-12-21 ENCOUNTER — Ambulatory Visit (HOSPITAL_COMMUNITY)
Admission: RE | Admit: 2012-12-21 | Discharge: 2012-12-21 | Disposition: A | Discharge status: Home / Self Care | Payer: Medicare Other | Source: Ambulatory Visit | Attending: Family Medicine | Admitting: Family Medicine

## 2012-12-21 DIAGNOSIS — Z1239 Encounter for other screening for malignant neoplasm of breast: Secondary | ICD-10-CM

## 2012-12-21 DIAGNOSIS — Z1231 Encounter for screening mammogram for malignant neoplasm of breast: Secondary | ICD-10-CM | POA: Insufficient documentation

## 2013-03-24 ENCOUNTER — Encounter: Payer: Medicare Other | Admitting: Family Medicine

## 2013-05-06 ENCOUNTER — Telehealth: Payer: Self-pay | Admitting: Family Medicine

## 2013-05-06 DIAGNOSIS — I10 Essential (primary) hypertension: Secondary | ICD-10-CM

## 2013-05-06 MED ORDER — TRIAMTERENE-HCTZ 37.5-25 MG PO TABS: 1.0000 | ORAL_TABLET | Freq: Every day | ORAL | Status: DC

## 2013-05-06 NOTE — Telephone Encounter (Signed)
Med sent to last until her next OV

## 2013-05-27 DIAGNOSIS — R7302 Impaired glucose tolerance (oral): Secondary | ICD-10-CM

## 2013-05-27 HISTORY — DX: Impaired glucose tolerance (oral): R73.02

## 2013-06-02 ENCOUNTER — Ambulatory Visit: Payer: Medicare Other | Admitting: Family Medicine

## 2013-06-22 ENCOUNTER — Encounter (INDEPENDENT_AMBULATORY_CARE_PROVIDER_SITE_OTHER): Payer: Self-pay | Admitting: *Deleted

## 2013-06-22 ENCOUNTER — Encounter: Payer: Self-pay | Admitting: Family Medicine

## 2013-06-22 ENCOUNTER — Ambulatory Visit (INDEPENDENT_AMBULATORY_CARE_PROVIDER_SITE_OTHER): Payer: Medicare HMO | Admitting: Family Medicine

## 2013-06-22 ENCOUNTER — Encounter (INDEPENDENT_AMBULATORY_CARE_PROVIDER_SITE_OTHER): Payer: Self-pay

## 2013-06-22 VITALS — BP 138/82 | HR 73 | Resp 16 | Wt 248.8 lb

## 2013-06-22 DIAGNOSIS — E8881 Metabolic syndrome: Secondary | ICD-10-CM

## 2013-06-22 DIAGNOSIS — E785 Hyperlipidemia, unspecified: Secondary | ICD-10-CM

## 2013-06-22 DIAGNOSIS — M5414 Radiculopathy, thoracic region: Secondary | ICD-10-CM

## 2013-06-22 DIAGNOSIS — N3090 Cystitis, unspecified without hematuria: Secondary | ICD-10-CM

## 2013-06-22 DIAGNOSIS — R7309 Other abnormal glucose: Secondary | ICD-10-CM

## 2013-06-22 DIAGNOSIS — IMO0002 Reserved for concepts with insufficient information to code with codable children: Secondary | ICD-10-CM

## 2013-06-22 DIAGNOSIS — I10 Essential (primary) hypertension: Secondary | ICD-10-CM

## 2013-06-22 DIAGNOSIS — R7303 Prediabetes: Secondary | ICD-10-CM

## 2013-06-22 DIAGNOSIS — Z1211 Encounter for screening for malignant neoplasm of colon: Secondary | ICD-10-CM

## 2013-06-22 DIAGNOSIS — Z23 Encounter for immunization: Secondary | ICD-10-CM

## 2013-06-22 DIAGNOSIS — E669 Obesity, unspecified: Secondary | ICD-10-CM

## 2013-06-22 LAB — POCT URINALYSIS DIPSTICK
Bilirubin, UA: NEGATIVE
GLUCOSE UA: NEGATIVE
Ketones, UA: NEGATIVE
NITRITE UA: NEGATIVE
Protein, UA: NEGATIVE
Spec Grav, UA: 1.015
UROBILINOGEN UA: 0.2
pH, UA: 5.5

## 2013-06-22 LAB — BASIC METABOLIC PANEL
BUN: 14 mg/dL (ref 6–23)
CALCIUM: 9.2 mg/dL (ref 8.4–10.5)
CO2: 32 mEq/L (ref 19–32)
Chloride: 97 mEq/L (ref 96–112)
Creat: 0.8 mg/dL (ref 0.50–1.10)
GLUCOSE: 92 mg/dL (ref 70–99)
POTASSIUM: 4 meq/L (ref 3.5–5.3)
Sodium: 140 mEq/L (ref 135–145)

## 2013-06-22 LAB — HEMOGLOBIN A1C
HEMOGLOBIN A1C: 6.1 % — AB (ref <5.7)
Mean Plasma Glucose: 128 mg/dL — ABNORMAL HIGH (ref <117)

## 2013-06-22 MED ORDER — MELOXICAM 15 MG PO TABS: ORAL_TABLET | ORAL | Status: DC

## 2013-06-22 MED ORDER — CIPROFLOXACIN HCL 500 MG PO TABS: 500.0000 mg | ORAL_TABLET | Freq: Two times a day (BID) | ORAL | Status: DC

## 2013-06-22 NOTE — Patient Instructions (Addendum)
Welcome to medicare by end February   Flu vaccine today  Meds sent for UTI and upper back pain. Please alternate meloxicam with tylenol 325 mg or 500mg    You are referred for colonoscopy  HBa1C and Chem 7 today    CONGRATS on weight loss  It is important that you exercise regularly at least 30 minutes 5 times a week. If you develop chest pain, have severe difficulty breathing, or feel very tired, stop exercising immediately and seek medical attention   A healthy diet is rich in fruit, vegetables and whole grains. Poultry fish, nuts and beans are a healthy choice for protein rather then red meat. A low sodium diet and drinking 64 ounces of water daily is generally recommended. Oils and sweet should be limited. Carbohydrates especially for those who are diabetic or overweight, should be limited to 34-45 gram per meal. It is important to eat on a regular schedule, at least 3 times daily. Snacks should be primarily fruits, vegetables or nuts.

## 2013-06-22 NOTE — Assessment & Plan Note (Addendum)
Controlled, no change in medication DASH diet and commitment to daily physical activity for a minimum of 30 minutes discussed and encouraged, as a part of hypertension management. The importance of attaining a healthy weight is also discussed.  

## 2013-06-22 NOTE — Progress Notes (Signed)
   Subjective:    Patient ID: Patricia Fuentes, female    DOB: May 31, 1945, 68 y.o.   MRN: [id number]  HPI  The PT is here for follow up and re-evaluation of chronic medical conditions, medication management and review of any available recent lab and radiology data.  Preventive health is updated, specifically  Cancer screening and Immunization.   . The PT denies any adverse reactions to current medications since the last visit.  She has been working on [employer] diet with weight loss success which is great, needs to commit  To regular exercise still. 1 month h/o upper left back pain radiating to shoulder and upper arm. No inciting trauma, somewhat improved with OTC meds. 1 month h/o frequency and dysuria,  Denies flank pain, fever or chills      Review of Systems See HPI Denies recent fever or chills. Denies sinus pressure, nasal congestion, ear pain or sore throat. Denies chest congestion, productive cough or wheezing. Denies chest pains, palpitations and leg swelling Denies abdominal pain, nausea, vomiting,diarrhea or constipation.    Denies headaches, seizures, numbness, or tingling. Denies depression, anxiety or insomnia. Denies skin break down or rash.        Objective:   Physical Exam  BP 138/82  Pulse 73  Resp 16  Wt 248 lb 12.8 oz (112.855 kg)  SpO2 95% Patient alert and oriented and in no cardiopulmonary distress.  HEENT: No facial asymmetry, EOMI, no sinus tenderness,  oropharynx pink and moist.  Neck supple no adenopathy.  Chest: Clear to auscultation bilaterally.  CVS: S1, S2 no murmurs, no S3.  ABD: Soft. Bowel sounds normal.No renal angle tenderness, mild suprapubic tenderness, no guarding or rebound  Ext: No edema  MS: decreased  ROM spine, shoulders, hips and knees.  Skin: Intact, no ulcerations or rash noted.  Psych: Good eye contact, normal affect. Memory intact not anxious or depressed appearing.  CNS: CN 2-12 intact, power, tone and  sensation normal throughout.       Assessment & Plan:  HYPERTENSION Controlled, no change in medication DASH diet and commitment to daily physical activity for a minimum of 30 minutes discussed and encouraged, as a part of hypertension management. The importance of attaining a healthy weight is also discussed.   Radicular pain of thoracic region Short sharp course of anti inflammatories  HYPERLIPIDEMIA Elevated LDL, would benefit from statin, but resists this Hyperlipidemia:Low fat diet discussed and encouraged.    Cystitis symptomativc with abnormal UA , antibiotic prescribed , will f/u c/s  Prediabetes Patient educated about the importance of limiting  Carbohydrate intake , the need to commit to daily physical activity for a minimum of 30 minutes , and to commit weight loss. The fact that changes in all these areas will reduce or eliminate all together the development of diabetes is stressed.   Updated lab needed at/ before next visit.   Metabolic syndrome X The increased risk of cardiovascular disease associated with this diagnosis, and the need to consistently work on lifestyle to change this is discussed. Following  a  heart healthy diet ,commitment to 30 minutes of exercise at least 5 days per week, as well as control of blood sugar and cholesterol , and achieving a healthy weight are all the areas to be addressed .   OBESITY, UNSPECIFIED Improved. Pt applauded on succesful weight loss through lifestyle change, and encouraged to continue same. Weight loss goal set for the next several months.

## 2013-06-23 LAB — URINE CULTURE

## 2013-07-04 NOTE — Assessment & Plan Note (Signed)
Short sharp course of anti inflammatories

## 2013-07-04 NOTE — Assessment & Plan Note (Signed)
The increased risk of cardiovascular disease associated with this diagnosis, and the need to consistently work on lifestyle to change this is discussed. Following  a  heart healthy diet ,commitment to 30 minutes of exercise at least 5 days per week, as well as control of blood sugar and cholesterol , and achieving a healthy weight are all the areas to be addressed .  

## 2013-07-04 NOTE — Assessment & Plan Note (Signed)
Elevated LDL, would benefit from statin, but resists this Hyperlipidemia:Low fat diet discussed and encouraged.

## 2013-07-04 NOTE — Assessment & Plan Note (Signed)
Patient educated about the importance of limiting  Carbohydrate intake , the need to commit to daily physical activity for a minimum of 30 minutes , and to commit weight loss. The fact that changes in all these areas will reduce or eliminate all together the development of diabetes is stressed.   Updated lab needed at/ before next visit.  

## 2013-07-04 NOTE — Assessment & Plan Note (Signed)
symptomativc with abnormal UA , antibiotic prescribed , will f/u c/s

## 2013-07-04 NOTE — Assessment & Plan Note (Signed)
Improved. Pt applauded on succesful weight loss through lifestyle change, and encouraged to continue same. Weight loss goal set for the next several months.  

## 2013-07-06 ENCOUNTER — Encounter (INDEPENDENT_AMBULATORY_CARE_PROVIDER_SITE_OTHER): Payer: Self-pay | Admitting: *Deleted

## 2013-07-28 ENCOUNTER — Encounter: Payer: Medicare HMO | Admitting: Family Medicine

## 2013-09-28 ENCOUNTER — Encounter: Payer: Medicare HMO | Admitting: Family Medicine

## 2013-11-12 ENCOUNTER — Encounter (INDEPENDENT_AMBULATORY_CARE_PROVIDER_SITE_OTHER): Payer: Self-pay | Admitting: *Deleted

## 2013-11-12 ENCOUNTER — Telehealth: Payer: Self-pay | Admitting: Family Medicine

## 2013-11-12 DIAGNOSIS — I10 Essential (primary) hypertension: Secondary | ICD-10-CM

## 2013-11-12 MED ORDER — TRIAMTERENE-HCTZ 37.5-25 MG PO TABS: 1.0000 | ORAL_TABLET | Freq: Every day | ORAL | Status: DC

## 2013-11-12 NOTE — Telephone Encounter (Signed)
bp med sent

## 2013-12-27 ENCOUNTER — Encounter (INDEPENDENT_AMBULATORY_CARE_PROVIDER_SITE_OTHER): Payer: Self-pay

## 2013-12-27 ENCOUNTER — Ambulatory Visit (INDEPENDENT_AMBULATORY_CARE_PROVIDER_SITE_OTHER): Payer: Medicare HMO | Admitting: Family Medicine

## 2013-12-27 ENCOUNTER — Encounter: Payer: Self-pay | Admitting: Family Medicine

## 2013-12-27 VITALS — BP 140/80 | HR 71 | Resp 16 | Ht 62.0 in | Wt 250.1 lb

## 2013-12-27 DIAGNOSIS — R7303 Prediabetes: Secondary | ICD-10-CM

## 2013-12-27 DIAGNOSIS — H547 Unspecified visual loss: Secondary | ICD-10-CM

## 2013-12-27 DIAGNOSIS — Z23 Encounter for immunization: Secondary | ICD-10-CM | POA: Insufficient documentation

## 2013-12-27 DIAGNOSIS — M1712 Unilateral primary osteoarthritis, left knee: Secondary | ICD-10-CM

## 2013-12-27 DIAGNOSIS — Z0000 Encounter for general adult medical examination without abnormal findings: Secondary | ICD-10-CM

## 2013-12-27 DIAGNOSIS — IMO0002 Reserved for concepts with insufficient information to code with codable children: Secondary | ICD-10-CM

## 2013-12-27 DIAGNOSIS — Z1231 Encounter for screening mammogram for malignant neoplasm of breast: Secondary | ICD-10-CM

## 2013-12-27 DIAGNOSIS — M1710 Unilateral primary osteoarthritis, unspecified knee: Secondary | ICD-10-CM

## 2013-12-27 DIAGNOSIS — I10 Essential (primary) hypertension: Secondary | ICD-10-CM

## 2013-12-27 DIAGNOSIS — R7309 Other abnormal glucose: Secondary | ICD-10-CM

## 2013-12-27 DIAGNOSIS — E785 Hyperlipidemia, unspecified: Secondary | ICD-10-CM

## 2013-12-27 LAB — CBC
HCT: 34.7 % — ABNORMAL LOW (ref 36.0–46.0)
Hemoglobin: 11.4 g/dL — ABNORMAL LOW (ref 12.0–15.0)
MCH: 27.5 pg (ref 26.0–34.0)
MCHC: 32.9 g/dL (ref 30.0–36.0)
MCV: 83.8 fL (ref 78.0–100.0)
PLATELETS: 227 10*3/uL (ref 150–400)
RBC: 4.14 MIL/uL (ref 3.87–5.11)
RDW: 14.7 % (ref 11.5–15.5)
WBC: 6.2 10*3/uL (ref 4.0–10.5)

## 2013-12-27 MED ORDER — AMLODIPINE BESYLATE 2.5 MG PO TABS: ORAL_TABLET | ORAL | Status: DC

## 2013-12-27 NOTE — Assessment & Plan Note (Signed)
Patient educated about the importance of limiting  Carbohydrate intake , the need to commit to daily physical activity for a minimum of 30 minutes , and to commit weight loss. The fact that changes in all these areas will reduce or eliminate all together the development of diabetes is stressed.   Updated lab needed at/ before next visit.  

## 2013-12-27 NOTE — Assessment & Plan Note (Signed)
Uncontrolled, add amlodipine at bedtime DASH diet and commitment to daily physical activity for a minimum of 30 minutes discussed and encouraged, as a part of hypertension management. The importance of attaining a healthy weight is also discussed.

## 2013-12-27 NOTE — Progress Notes (Signed)
Subjective:    Patient ID: Patricia Fuentes, female    DOB: 07/22/1945, 68 y.o.   MRN: [id number]  HPI Preventive Screening-Counseling & Management   Patient present here today for an initial  Medicare annual wellness visit. Blood pressure is uncontrolled and adjustments made to her medication. She has worsened left  knee pain limiting safe mobility and she is referred to orthopedics. 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 No  Over the past two weeks, have you felt little interest or pleasure in doing things? No  Have you lost interest or pleasure in daily life? No  Do you often feel hopeless? No  Do you cry easily over simple problems? No   Activities of Daily Living  In your present state of health, do you have any difficulty performing the following activities?  Driving?: yes at night due to vision Managing money?: No Feeding yourself?:No Getting from bed to chair?:No Climbing a flight of stairs?:yes due to left knee stiffness and pain Preparing food and eating?:No Bathing or showering?:No Getting dressed?:No Getting to the toilet?:No Using the toilet?:No Moving around from place to place?: yes due to left knee pain and stiffness  Fall Risk Assessment In the past year have you  fallen or had a near fall?:No Are you currently taking any medications that make you dizzy?:No   Hearing Difficulties: No Do you often ask people to speak up or repeat themselves?:No Do you experience ringing or noises in your ears?:No Do you have difficulty understanding soft or whispered voices?:No  Cognitive Testing  Alert? Yes Normal Appearance?Yes  Oriented to person? Yes Place? Yes  Time? Yes  Displays appropriate judgment?Yes  Can read the correct time from a watch face? yes Are you having problems remembering things?No  Advanced Directives have been discussed with the patient?Yes , full code, however needs to further discuss and document    List the Names of Other Physician/Practitioners you currently use: Dr  Edilia Bo any recent Medical Services you may have received from other than [facility] providers in the past year (date may be approximate).   Assessment:    Annual Wellness Exam   Plan:    .  Medicare Attestation  I have personally reviewed:  The patient's medical and social history  Their use of alcohol, tobacco or illicit drugs  Their current medications and supplements  The patient's functional ability including ADLs,fall risks, home safety risks, cognitive, and hearing and visual impairment  Diet and physical activities  Evidence for depression or mood disorders  The patient's weight, height, BMI, and visual acuity have been recorded in the chart. I have made referrals, counseling, and provided education to the patient based on review of the above  and I have provided the patient with a written personalized care plan for preventive services.      Review of Systems     Objective:   Physical Exam BP 140/80  Pulse 71  Resp 16  Ht 5\' 2"  (1.575 m)  Wt 250 lb 1.9 oz (113.454 kg)  BMI 45.74 kg/m2  SpO2 98% Patient alert and oriented and in no cardiopulmonary distress.  .    CVS: S1, S2 no murmurs, no S3.Regular rate.   MS: Adequate ROM  spine, shoulders, hips and marked reduced ROM in left   Knee with tenderness and deformity.        Assessment & Plan:  Medicare annual wellness visit, initial Initial Annual wellness exam as documented. Counseling done  re healthy lifestyle involving commitment to 150 minutes exercise per week, heart healthy diet, and attaining healthy weight.The importance of adequate sleep also discussed. Regular seat belt use and safe storage  of firearms if patient has them, is also discussed. Changes in health habits are decided on by the patient with goals and time frames  set for achieving them. Immunization and cancer screening needs are specifically addressed at this visit.   Need for vaccination with 13-polyvalent pneumococcal conjugate vaccine Vaccine administered  Arthritis of left knee Increased pain and stiffness request referral to ortho  HYPERTENSION Uncontrolled, add amlodipine at bedtime DASH diet and commitment to daily physical activity for a minimum of 30 minutes discussed and encouraged, as a part of hypertension management. The importance of attaining a healthy weight is also discussed.   Reduced vision screening reveals 20/40 vision  In right eye, and pt reports poor night vision,  Refer for eye exam  Prediabetes Patient educated about the importance of limiting  Carbohydrate intake , the need to commit to daily physical activity for a minimum of 30 minutes , and to commit weight loss. The fact that changes in all these areas will reduce or eliminate all together the development of diabetes is stressed.   Updated lab needed at/ before next visit.

## 2013-12-27 NOTE — Assessment & Plan Note (Addendum)
Initial Annual wellness exam as documented. Counseling done  re healthy lifestyle involving commitment to 150 minutes exercise per week, heart healthy diet, and attaining healthy weight.The importance of adequate sleep also discussed. Regular seat belt use and safe storage  of firearms if patient has them, is also discussed. Changes in health habits are decided on by the patient with goals and time frames  set for achieving them. Immunization and cancer screening needs are specifically addressed at this visit.

## 2013-12-27 NOTE — Assessment & Plan Note (Signed)
Vaccine administered.

## 2013-12-27 NOTE — Patient Instructions (Signed)
F/u early December, call if you need me before Flu vaccine should be available in Sept, call Prevnar today  You are referred to Dr Nile RiggsShapiro for further eval of your vision  You are referred for a mammiogram  Pls call Dr Patty Sermonsehman's office , you need a colonoscopy  You are referred to Dr Romeo AppleHarrison, be careful not to fall  Labs today  New additional medication for BP is amlodipine take at 9 pm every night   It is important that you exercise regularly at least 30 minutes 5 times a week. If you develop chest pain, have severe difficulty breathing, or feel very tired, stop exercising immediately and seek medical attention    A healthy diet is rich in fruit, vegetables and whole grains. Poultry fish, nuts and beans are a healthy choice for protein rather then red meat. A low sodium diet and drinking 64 ounces of water daily is generally recommended. Oils and sweet should be limited. Carbohydrates especially for those who are diabetic or overweight, should be limited to 34-45 gram per meal. It is important to eat on a regular schedule, at least 3 times daily. Snacks should be primarily fruits, vegetables or nuts.

## 2013-12-27 NOTE — Assessment & Plan Note (Signed)
Increased pain and stiffness request referral to ortho

## 2013-12-27 NOTE — Assessment & Plan Note (Addendum)
screening reveals 20/40 vision  In right eye, and pt reports poor night vision,  Refer for eye exam

## 2013-12-28 LAB — LIPID PANEL
Cholesterol: 173 mg/dL (ref 0–200)
HDL: 53 mg/dL (ref >39)
LDL CALC: 104 mg/dL — AB (ref 0–99)
Total CHOL/HDL Ratio: 3.3 Ratio
Triglycerides: 80 mg/dL (ref <150)
VLDL: 16 mg/dL (ref 0–40)

## 2013-12-28 LAB — COMPREHENSIVE METABOLIC PANEL
ALK PHOS: 59 U/L (ref 39–117)
ALT: 11 U/L (ref 0–35)
AST: 14 U/L (ref 0–37)
Albumin: 3.7 g/dL (ref 3.5–5.2)
BILIRUBIN TOTAL: 0.2 mg/dL (ref 0.2–1.2)
BUN: 16 mg/dL (ref 6–23)
CALCIUM: 8.4 mg/dL (ref 8.4–10.5)
CHLORIDE: 103 meq/L (ref 96–112)
CO2: 30 mEq/L (ref 19–32)
CREATININE: 0.7 mg/dL (ref 0.50–1.10)
Glucose, Bld: 92 mg/dL (ref 70–99)
Potassium: 3.9 mEq/L (ref 3.5–5.3)
Sodium: 140 mEq/L (ref 135–145)
Total Protein: 7.5 g/dL (ref 6.0–8.3)

## 2013-12-28 LAB — HEMOGLOBIN A1C
HEMOGLOBIN A1C: 6.2 % — AB (ref <5.7)
Mean Plasma Glucose: 131 mg/dL — ABNORMAL HIGH (ref <117)

## 2013-12-28 LAB — TSH: TSH: 1.892 u[IU]/mL (ref 0.350–4.500)

## 2014-01-03 NOTE — Addendum Note (Signed)
Addended by: Kandis FantasiaSLADE, COURTNEY B on: 01/03/2014 01:41 PM   Modules accepted: Orders

## 2014-01-05 ENCOUNTER — Ambulatory Visit (HOSPITAL_COMMUNITY): Payer: Medicare HMO

## 2014-01-20 ENCOUNTER — Ambulatory Visit: Payer: Medicare HMO | Admitting: Orthopedic Surgery

## 2014-05-02 ENCOUNTER — Ambulatory Visit: Payer: Medicare HMO | Admitting: Family Medicine

## 2014-05-02 ENCOUNTER — Encounter: Payer: Self-pay | Admitting: Family Medicine

## 2014-05-25 ENCOUNTER — Other Ambulatory Visit: Payer: Self-pay | Admitting: Family Medicine

## 2014-06-06 ENCOUNTER — Ambulatory Visit (HOSPITAL_COMMUNITY): Payer: Medicare HMO

## 2014-06-08 ENCOUNTER — Ambulatory Visit (HOSPITAL_COMMUNITY)
Admission: RE | Admit: 2014-06-08 | Discharge: 2014-06-08 | Disposition: A | Discharge status: Home / Self Care | Payer: Medicare HMO | Source: Ambulatory Visit | Attending: Family Medicine | Admitting: Family Medicine

## 2014-06-08 DIAGNOSIS — Z1231 Encounter for screening mammogram for malignant neoplasm of breast: Secondary | ICD-10-CM

## 2014-06-16 ENCOUNTER — Other Ambulatory Visit: Payer: Self-pay

## 2014-06-16 ENCOUNTER — Other Ambulatory Visit: Payer: Self-pay | Admitting: Family Medicine

## 2014-06-16 DIAGNOSIS — E559 Vitamin D deficiency, unspecified: Secondary | ICD-10-CM

## 2014-06-16 DIAGNOSIS — I10 Essential (primary) hypertension: Secondary | ICD-10-CM

## 2014-06-16 DIAGNOSIS — R7303 Prediabetes: Secondary | ICD-10-CM

## 2014-06-16 DIAGNOSIS — E785 Hyperlipidemia, unspecified: Secondary | ICD-10-CM

## 2014-06-16 DIAGNOSIS — Z96659 Presence of unspecified artificial knee joint: Secondary | ICD-10-CM

## 2014-07-05 ENCOUNTER — Ambulatory Visit: Payer: Medicare HMO | Admitting: Orthopedic Surgery

## 2014-07-11 ENCOUNTER — Other Ambulatory Visit: Payer: Self-pay

## 2014-07-11 MED ORDER — TRIAMTERENE-HCTZ 37.5-25 MG PO TABS: 1.0000 | ORAL_TABLET | Freq: Every day | ORAL | Status: DC

## 2014-07-15 DIAGNOSIS — E559 Vitamin D deficiency, unspecified: Secondary | ICD-10-CM | POA: Diagnosis not present

## 2014-07-15 DIAGNOSIS — I10 Essential (primary) hypertension: Secondary | ICD-10-CM | POA: Diagnosis not present

## 2014-07-15 DIAGNOSIS — R7309 Other abnormal glucose: Secondary | ICD-10-CM | POA: Diagnosis not present

## 2014-07-15 LAB — CBC WITH DIFFERENTIAL/PLATELET
BASOS ABS: 0 10*3/uL (ref 0.0–0.1)
BASOS PCT: 0 % (ref 0–1)
EOS PCT: 6 % — AB (ref 0–5)
Eosinophils Absolute: 0.3 10*3/uL (ref 0.0–0.7)
HCT: 34.9 % — ABNORMAL LOW (ref 36.0–46.0)
Hemoglobin: 11.3 g/dL — ABNORMAL LOW (ref 12.0–15.0)
Lymphocytes Relative: 30 % (ref 12–46)
Lymphs Abs: 1.7 10*3/uL (ref 0.7–4.0)
MCH: 27.6 pg (ref 26.0–34.0)
MCHC: 32.4 g/dL (ref 30.0–36.0)
MCV: 85.1 fL (ref 78.0–100.0)
MONO ABS: 0.6 10*3/uL (ref 0.1–1.0)
MPV: 11.5 fL (ref 8.6–12.4)
Monocytes Relative: 11 % (ref 3–12)
NEUTROS ABS: 3 10*3/uL (ref 1.7–7.7)
Neutrophils Relative %: 53 % (ref 43–77)
PLATELETS: 212 10*3/uL (ref 150–400)
RBC: 4.1 MIL/uL (ref 3.87–5.11)
RDW: 14.5 % (ref 11.5–15.5)
WBC: 5.6 10*3/uL (ref 4.0–10.5)

## 2014-07-15 LAB — BASIC METABOLIC PANEL
BUN: 12 mg/dL (ref 6–23)
CALCIUM: 8.7 mg/dL (ref 8.4–10.5)
CHLORIDE: 101 meq/L (ref 96–112)
CO2: 30 mEq/L (ref 19–32)
CREATININE: 0.69 mg/dL (ref 0.50–1.10)
Glucose, Bld: 104 mg/dL — ABNORMAL HIGH (ref 70–99)
Potassium: 3.6 mEq/L (ref 3.5–5.3)
Sodium: 140 mEq/L (ref 135–145)

## 2014-07-16 LAB — VITAMIN D 25 HYDROXY (VIT D DEFICIENCY, FRACTURES): Vit D, 25-Hydroxy: 7 ng/mL — ABNORMAL LOW (ref 30–100)

## 2014-07-18 ENCOUNTER — Ambulatory Visit (INDEPENDENT_AMBULATORY_CARE_PROVIDER_SITE_OTHER): Payer: Commercial Managed Care - HMO | Admitting: Family Medicine

## 2014-07-18 ENCOUNTER — Encounter: Payer: Self-pay | Admitting: Family Medicine

## 2014-07-18 VITALS — BP 144/72 | HR 72 | Resp 18 | Ht 62.0 in | Wt 248.0 lb

## 2014-07-18 DIAGNOSIS — E8881 Metabolic syndrome: Secondary | ICD-10-CM

## 2014-07-18 DIAGNOSIS — E559 Vitamin D deficiency, unspecified: Secondary | ICD-10-CM | POA: Diagnosis not present

## 2014-07-18 DIAGNOSIS — R7303 Prediabetes: Secondary | ICD-10-CM

## 2014-07-18 DIAGNOSIS — I10 Essential (primary) hypertension: Secondary | ICD-10-CM | POA: Diagnosis not present

## 2014-07-18 DIAGNOSIS — E785 Hyperlipidemia, unspecified: Secondary | ICD-10-CM | POA: Diagnosis not present

## 2014-07-18 DIAGNOSIS — G4719 Other hypersomnia: Secondary | ICD-10-CM

## 2014-07-18 DIAGNOSIS — G479 Sleep disorder, unspecified: Secondary | ICD-10-CM | POA: Insufficient documentation

## 2014-07-18 DIAGNOSIS — R6889 Other general symptoms and signs: Secondary | ICD-10-CM | POA: Diagnosis not present

## 2014-07-18 DIAGNOSIS — R7309 Other abnormal glucose: Secondary | ICD-10-CM | POA: Diagnosis not present

## 2014-07-18 DIAGNOSIS — R9431 Abnormal electrocardiogram [ECG] [EKG]: Secondary | ICD-10-CM

## 2014-07-18 DIAGNOSIS — R5382 Chronic fatigue, unspecified: Secondary | ICD-10-CM | POA: Diagnosis not present

## 2014-07-18 DIAGNOSIS — Z23 Encounter for immunization: Secondary | ICD-10-CM | POA: Insufficient documentation

## 2014-07-18 DIAGNOSIS — R5383 Other fatigue: Secondary | ICD-10-CM | POA: Insufficient documentation

## 2014-07-18 MED ORDER — ERGOCALCIFEROL 1.25 MG (50000 UT) PO CAPS: 50000.0000 [IU] | ORAL_CAPSULE | ORAL | Status: DC

## 2014-07-18 MED ORDER — ATORVASTATIN CALCIUM 10 MG PO TABS: 10.0000 mg | ORAL_TABLET | Freq: Every day | ORAL | Status: DC

## 2014-07-18 NOTE — Patient Instructions (Addendum)
F/u in 4 month, call if you need me before  Flu vaccine today.  EKG in office today due to c/o excessive fatigue etc,is not totally normal, suggests that you may have had heart damage in the past, so it is VERY important to keep appt with cardiology  New medications to start aspirin 31m every day, this improves vascular health and reduces stroke risk in women  Lipitor 10 mg one at bedtime, also reduce fried and fatty foods and sweets and carbs  [place]lene Laughterto evaluate you for possible sleep apnea  Vit D is low, you need once weekly pill, it has been sent to your pharmacy, take for at least 6 month  Fasting lipid, cmp and EGFr, hBa1C and TSH in 4 month  Need to return stool cards for colon screening since you are holding off on your colonoscopy

## 2014-07-18 NOTE — Assessment & Plan Note (Signed)
6 month h/o worsening exercise intolerance , and exertional fatigue, multiple CV risk factors and EKG today, will need to see cardiology

## 2014-07-18 NOTE — Assessment & Plan Note (Signed)
Possible lateral ischemia, symptomatic with c/o increased exertional fatigue and poor exercise tolerance Started low dos statin and commited to daily aspirin

## 2014-07-18 NOTE — Assessment & Plan Note (Signed)
The increased risk of cardiovascular disease associated with this diagnosis, and the need to consistently work on lifestyle to change this is discussed. Following  a  heart healthy diet ,commitment to 30 minutes of exercise at least 5 days per week, as well as control of blood sugar and cholesterol , and achieving a healthy weight are all the areas to be addressed .  

## 2014-07-18 NOTE — Assessment & Plan Note (Addendum)
Approximate 4 to 6 month h/o worsening exercise intolerance  Multiple CV risk factors, EKG in office shows abnormality in lateral leads suggestive of past ischemia. Needs for mal cardiology eval

## 2014-07-18 NOTE — Assessment & Plan Note (Signed)
Not at goal. DASH diet and commitment to daily physical activity for a minimum of 30 minutes discussed and encouraged, as a part of hypertension management. The importance of attaining a healthy weight is also discussed.  No med change at this visit

## 2014-07-18 NOTE — Assessment & Plan Note (Signed)
Hyperlipidemia:Low fat diet discussed and encouraged.  Start statin, and commit to aspirin daily

## 2014-07-18 NOTE — Assessment & Plan Note (Addendum)
Awaiting updated lab result at time of visit. Patient educated about the importance of limiting  Carbohydrate intake , the need to commit to daily physical activity for a minimum of 30 minutes , and to commit weight loss. The fact that changes in all these areas will reduce or eliminate all together the development of diabetes is stressed.   Updated lab needed at/ before next visit.

## 2014-07-18 NOTE — Assessment & Plan Note (Signed)
Vaccine administered at visit.  

## 2014-07-18 NOTE — Assessment & Plan Note (Signed)
Start weekly vit D for at least 6 month

## 2014-07-18 NOTE — Assessment & Plan Note (Signed)
4 to 6 monht h/o excessive daytime sleepiness, refer for sleep study.

## 2014-07-18 NOTE — Progress Notes (Signed)
Subjective:    Patient ID: Patricia Fuentes, female    DOB: [birth year], 69 y.o.   MRN: [id number]  HPI The PT is here for follow up and re-evaluation of chronic medical conditions, medication management and review of any available recent lab and radiology data.  Preventive health is updated, specifically  Cancer screening and Immunization.   Questions or concerns regarding consultations or procedures which the PT has had in the interim are  addressed. The PT denies any adverse reactions to current medications since the last visit.  T4 to 6 month h/o excessive fatigue and poor exercise tolerance Wants to sleep all the time Gets tired doing her regular housework, has to stop and rest    Review of Systems See HPI Denies recent fever or chills. Denies sinus pressure, nasal congestion, ear pain or sore throat. Denies chest congestion, productive cough or wheezing.  Denies abdominal pain, nausea, vomiting,diarrhea or constipation.   Denies dysuria, frequency, hesitancy or incontinence. Denies uncontrolled joint pain, swelling and limitation in mobility. Denies headaches, seizures, numbness, or tingling. Denies depression, anxiety or insomnia. Denies skin break down or rash.        Objective:   Physical Exam  BP 144/72 mmHg  Pulse 72  Resp 18  Ht 5\' 2"  (1.575 m)  Wt 248 lb 0.6 oz (112.51 kg)  BMI 45.36 kg/m2  SpO2 93% Patient alert and oriented and in no cardiopulmonary distress.  HEENT: No facial asymmetry, EOMI,   oropharynx pink and moist.  Neck supple no JVD, no mass.  Chest: Clear to auscultation bilaterally.No chest wall tenderness  CVS: S1, S2 no murmurs, no S3.Regular rate.  ABD: Soft non tender.   Ext: No edema  MS: Adequate ROM spine, shoulders, hips and knees.  Skin: Intact, no ulcerations or rash noted.  Psych: Good eye contact, normal affect. Memory intact not anxious or depressed appearing.  CNS: CN 2-12 intact, power,  normal throughout.no focal  deficits noted.       Assessment & Plan:  Fatigue 6 month h/o worsening exercise intolerance , and exertional fatigue, multiple CV risk factors and EKG today, will need to see cardiology   Need for prophylactic vaccination and inoculation against influenza Vaccine administered at visit.    Essential hypertension Not at goal. DASH diet and commitment to daily physical activity for a minimum of 30 minutes discussed and encouraged, as a part of hypertension management. The importance of attaining a healthy weight is also discussed.  No med change at this visit   Prediabetes Awaiting updated lab result at time of visit. Patient educated about the importance of limiting  Carbohydrate intake , the need to commit to daily physical activity for a minimum of 30 minutes , and to commit weight loss. The fact that changes in all these areas will reduce or eliminate all together the development of diabetes is stressed.   Updated lab needed at/ before next visit.    Metabolic syndrome X The increased risk of cardiovascular disease associated with this diagnosis, and the need to consistently work on lifestyle to change this is discussed. Following  a  heart healthy diet ,commitment to 30 minutes of exercise at least 5 days per week, as well as control of blood sugar and cholesterol , and achieving a healthy weight are all the areas to be addressed .    Vitamin D deficiency Start weekly vit D for at least 6 month   Hyperlipidemia LDL goal <100 Hyperlipidemia:Low fat diet discussed and  encouraged.  Start statin, and commit to aspirin daily   Exercise intolerance Approximate 4 to 6 month h/o worsening exercise intolerance  Multiple CV risk factors, EKG in office shows abnormality in lateral leads suggestive of past ischemia. Needs for mal cardiology eval   Sleep disorder 4 to 6 monht h/o excessive daytime sleepiness, refer for sleep study.   Nonspecific abnormal  electrocardiogram (ECG) (EKG) Possible lateral ischemia, symptomatic with c/o increased exertional fatigue and poor exercise tolerance Started low dos statin and commited to daily aspirin

## 2014-07-19 LAB — HEMOGLOBIN A1C
Hgb A1c MFr Bld: 6.1 % — ABNORMAL HIGH (ref <5.7)
Mean Plasma Glucose: 128 mg/dL — ABNORMAL HIGH (ref <117)

## 2014-07-25 ENCOUNTER — Encounter: Payer: Self-pay | Admitting: Family Medicine

## 2014-08-03 ENCOUNTER — Encounter (HOSPITAL_COMMUNITY): Payer: Self-pay

## 2014-08-03 ENCOUNTER — Emergency Department (HOSPITAL_COMMUNITY)
Admission: EM | Admit: 2014-08-03 | Discharge: 2014-08-03 | Disposition: A | Discharge status: Home / Self Care | Payer: Commercial Managed Care - HMO | Attending: Emergency Medicine | Admitting: Emergency Medicine

## 2014-08-03 ENCOUNTER — Ambulatory Visit: Payer: Medicare HMO | Admitting: Cardiology

## 2014-08-03 DIAGNOSIS — E669 Obesity, unspecified: Secondary | ICD-10-CM | POA: Diagnosis not present

## 2014-08-03 DIAGNOSIS — I10 Essential (primary) hypertension: Secondary | ICD-10-CM | POA: Insufficient documentation

## 2014-08-03 DIAGNOSIS — R1033 Periumbilical pain: Secondary | ICD-10-CM | POA: Insufficient documentation

## 2014-08-03 DIAGNOSIS — Z90710 Acquired absence of both cervix and uterus: Secondary | ICD-10-CM | POA: Insufficient documentation

## 2014-08-03 DIAGNOSIS — M1990 Unspecified osteoarthritis, unspecified site: Secondary | ICD-10-CM | POA: Diagnosis not present

## 2014-08-03 DIAGNOSIS — R112 Nausea with vomiting, unspecified: Secondary | ICD-10-CM | POA: Diagnosis not present

## 2014-08-03 DIAGNOSIS — Z79899 Other long term (current) drug therapy: Secondary | ICD-10-CM | POA: Insufficient documentation

## 2014-08-03 DIAGNOSIS — E785 Hyperlipidemia, unspecified: Secondary | ICD-10-CM | POA: Diagnosis not present

## 2014-08-03 LAB — URINALYSIS, ROUTINE W REFLEX MICROSCOPIC
BILIRUBIN URINE: NEGATIVE
Glucose, UA: NEGATIVE mg/dL
KETONES UR: NEGATIVE mg/dL
Nitrite: NEGATIVE
PH: 6 (ref 5.0–8.0)
PROTEIN: NEGATIVE mg/dL
SPECIFIC GRAVITY, URINE: 1.01 (ref 1.005–1.030)
UROBILINOGEN UA: 0.2 mg/dL (ref 0.0–1.0)

## 2014-08-03 LAB — CBC WITH DIFFERENTIAL/PLATELET
BASOS ABS: 0 10*3/uL (ref 0.0–0.1)
Basophils Relative: 0 % (ref 0–1)
Eosinophils Absolute: 0.2 10*3/uL (ref 0.0–0.7)
Eosinophils Relative: 2 % (ref 0–5)
HCT: 37.1 % (ref 36.0–46.0)
HEMOGLOBIN: 11.7 g/dL — AB (ref 12.0–15.0)
LYMPHS ABS: 0.7 10*3/uL (ref 0.7–4.0)
LYMPHS PCT: 7 % — AB (ref 12–46)
MCH: 27.8 pg (ref 26.0–34.0)
MCHC: 31.5 g/dL (ref 30.0–36.0)
MCV: 88.1 fL (ref 78.0–100.0)
MONOS PCT: 9 % (ref 3–12)
Monocytes Absolute: 0.9 10*3/uL (ref 0.1–1.0)
NEUTROS PCT: 82 % — AB (ref 43–77)
Neutro Abs: 8.2 10*3/uL — ABNORMAL HIGH (ref 1.7–7.7)
PLATELETS: 181 10*3/uL (ref 150–400)
RBC: 4.21 MIL/uL (ref 3.87–5.11)
RDW: 14.3 % (ref 11.5–15.5)
WBC: 10 10*3/uL (ref 4.0–10.5)

## 2014-08-03 LAB — BASIC METABOLIC PANEL
Anion gap: 7 (ref 5–15)
BUN: 19 mg/dL (ref 6–23)
CHLORIDE: 102 mmol/L (ref 96–112)
CO2: 32 mmol/L (ref 19–32)
CREATININE: 1.16 mg/dL — AB (ref 0.50–1.10)
Calcium: 8.5 mg/dL (ref 8.4–10.5)
GFR calc non Af Amer: 48 mL/min — ABNORMAL LOW (ref >90)
GFR, EST AFRICAN AMERICAN: 56 mL/min — AB (ref >90)
Glucose, Bld: 146 mg/dL — ABNORMAL HIGH (ref 70–99)
POTASSIUM: 3.4 mmol/L — AB (ref 3.5–5.1)
Sodium: 141 mmol/L (ref 135–145)

## 2014-08-03 LAB — URINE MICROSCOPIC-ADD ON

## 2014-08-03 MED ORDER — ONDANSETRON 4 MG PO TBDP
4.0000 mg | ORAL_TABLET | Freq: Once | ORAL | Status: AC
  Administered 2014-08-03: 4 mg via ORAL
  Filled 2014-08-03: qty 1

## 2014-08-03 MED ORDER — ONDANSETRON 4 MG PO TBDP: 4.0000 mg | ORAL_TABLET | Freq: Three times a day (TID) | ORAL | Status: DC | PRN

## 2014-08-03 NOTE — Discharge Instructions (Signed)

## 2014-08-03 NOTE — ED Provider Notes (Addendum)
CSN: [id number]     Arrival date & time 08/03/14  1641 History   This chart was scribed for Rolland Porter, MD by Gwenyth Ober, ED Scribe. This patient was seen in room APA01/APA01 and the patient's care was started at 7:18 PM.    Chief Complaint  Patient presents with  . Emesis   The history is provided by the patient. No language interpreter was used.    HPI Comments: Patricia Fuentes is a 69 y.o. female who presents to the Emergency Department complaining of intermittent, mild cramping abdominal pain that started 5 hours ago. Pt states HA, nausea and vomiting as associated symptoms. She notes abdominal pain improves after vomiting. Pt reports onset of symptoms started 1 hour after she ate chicken nuggets. She denies a history of gallbladder issues or cholecystectomy. Pt also denies diarrhea as an associated symptom.  Past Medical History  Diagnosis Date  . Back pain with radiation   . Osteoarthrosis, unspecified whether generalized or localized, lower leg   . Obesity, unspecified   . Hypertension   . Hyperlipidemia 2010 approx   Past Surgical History  Procedure Laterality Date  . Knee arthroscopy  left  . Total knee arthroplasty  right 2011  . Total abdominal hysterectomy  1995 approx    fibroids   Family History  Problem Relation Age of Onset  . Hypertension Mother   . Stroke Mother 59    deceased due CVa at 36  . Diabetes Father   . Cancer Father 27    unknown  . Heart disease Paternal Aunt   . Hypertension Brother   . Hypertension Brother   . Hypertension Brother    History  Substance Use Topics  . Smoking status: Never Smoker   . Smokeless tobacco: Not on file  . Alcohol Use: No   OB History    No data available     Review of Systems  Constitutional: Negative for fever, chills, diaphoresis, appetite change and fatigue.  HENT: Negative for mouth sores, sore throat and trouble swallowing.   Eyes: Negative for visual disturbance.  Respiratory: Negative for  cough, chest tightness, shortness of breath and wheezing.   Cardiovascular: Negative for chest pain.  Gastrointestinal: Positive for nausea, vomiting and abdominal pain. Negative for diarrhea and abdominal distention.  Endocrine: Negative for polydipsia, polyphagia and polyuria.  Genitourinary: Negative for dysuria, frequency and hematuria.  Musculoskeletal: Negative for gait problem.  Skin: Negative for color change, pallor and rash.  Neurological: Negative for dizziness, syncope, light-headedness and headaches.  Hematological: Does not bruise/bleed easily.  Psychiatric/Behavioral: Negative for behavioral problems and confusion.  All other systems reviewed and are negative.  Allergies  Promethazine hcl  Home Medications   Prior to Admission medications   Medication Sig Start Date End Date Taking? Authorizing Provider  amLODipine (NORVASC) 2.5 MG tablet One tablet daily at 9pm each nighht for blood pressure 12/27/13  Yes Kerri Perches, MD  Aspirin-Caffeine [facility] FAST PAIN RELIEF PO) Take 1 packet by mouth daily.   Yes Historical Provider, MD  triamterene-hydrochlorothiazide (MAXZIDE-25) 37.5-25 MG per tablet Take 1 tablet by mouth daily. 07/11/14  Yes Kerri Perches, MD  atorvastatin (LIPITOR) 10 MG tablet Take 1 tablet (10 mg total) by mouth daily. Patient not taking: Reported on 08/03/2014 07/18/14   Kerri Perches, MD  calcium-vitamin D (OSCAL 500/200 D-3) 500-200 MG-UNIT per tablet Take 1 tablet by mouth 2 (two) times daily. Patient not taking: Reported on 08/03/2014 12/15/12 12/15/13  Milus Mallick  Lodema HongSimpson, MD  ergocalciferol (VITAMIN D2) 50000 UNITS capsule Take 1 capsule (50,000 Units total) by mouth once a week. One capsule once weekly Patient not taking: Reported on 08/03/2014 07/18/14   Kerri PerchesMargaret E Simpson, MD  ondansetron (ZOFRAN ODT) 4 MG disintegrating tablet Take 1 tablet (4 mg total) by mouth every 8 (eight) hours as needed for nausea. 08/03/14   Rolland PorterMark Meital Riehl, MD   BP 171/78 mmHg   Pulse 68  Temp(Src) 97.7 F (36.5 C) (Oral)  Resp 20  Ht 5\' 2"  (1.575 m)  Wt 249 lb (112.946 kg)  BMI 45.53 kg/m2  SpO2 94% Physical Exam  Constitutional: She is oriented to person, place, and time. She appears well-developed and well-nourished. No distress.  Protuberant  HENT:  Head: Normocephalic.  Eyes: Conjunctivae are normal. Pupils are equal, round, and reactive to light. No scleral icterus.  No jaundice  Neck: Normal range of motion. Neck supple. No thyromegaly present.  Cardiovascular: Normal rate and regular rhythm.  Exam reveals no gallop and no friction rub.   No murmur heard. Pulmonary/Chest: Effort normal and breath sounds normal. No respiratory distress. She has no wheezes. She has no rales.  Abdominal: Soft. Bowel sounds are normal. She exhibits no distension. There is tenderness. There is no rebound.  Mild periumbilical tenderness  Musculoskeletal: Normal range of motion.  Neurological: She is alert and oriented to person, place, and time.  Skin: Skin is warm and dry. No rash noted.  Psychiatric: She has a normal mood and affect. Her behavior is normal.  Nursing note and vitals reviewed.   ED Course  Procedures   DIAGNOSTIC STUDIES: Oxygen Saturation is 95% on RA, adequate by my interpretation.    COORDINATION OF CARE: 7:23 PM Discussed treatment plan with pt which includes lab work. Pt agreed to plan.   Labs Review Labs Reviewed  CBC WITH DIFFERENTIAL/PLATELET - Abnormal; Notable for the following:    Hemoglobin 11.7 (*)    Neutrophils Relative % 82 (*)    Neutro Abs 8.2 (*)    Lymphocytes Relative 7 (*)    All other components within normal limits  BASIC METABOLIC PANEL - Abnormal; Notable for the following:    Potassium 3.4 (*)    Glucose, Bld 146 (*)    Creatinine, Ser 1.16 (*)    GFR calc non Af Amer 48 (*)    GFR calc Af Amer 56 (*)    All other components within normal limits  URINALYSIS, ROUTINE W REFLEX MICROSCOPIC - Abnormal; Notable for  the following:    APPearance TURBID (*)    Hgb urine dipstick TRACE (*)    Leukocytes, UA MODERATE (*)    All other components within normal limits  URINE MICROSCOPIC-ADD ON - Abnormal; Notable for the following:    Squamous Epithelial / LPF FEW (*)    All other components within normal limits    Imaging Review No results found.   EKG Interpretation None      MDM   Final diagnoses:  Non-intractable vomiting with nausea, vomiting of unspecified type    Abdominal exam. Reassuring labs. Mild elevation of creatinine. I made her aware of this. I've asked her to continue to orally hydrate herself at home. Primary care follow-up. ER with worsening or recurrence  I personally performed the services described in this documentation, which was scribed in my presence. The recorded information has been reviewed and is accurate.    Rolland PorterMark Phuoc Huy, MD 08/03/14 [id number]  Rolland PorterMark Jonte Shiller, MD 08/03/14 2141

## 2014-08-03 NOTE — ED Notes (Signed)
Pt reports abd pain with n/v after eating some chicken nuggets around 2pm today.  Denies diarrhea.

## 2014-08-16 ENCOUNTER — Ambulatory Visit: Payer: Medicare HMO | Admitting: Cardiology

## 2014-08-19 ENCOUNTER — Other Ambulatory Visit: Payer: Self-pay | Admitting: Family Medicine

## 2014-09-19 ENCOUNTER — Encounter: Payer: Medicare HMO | Admitting: Family Medicine

## 2014-09-21 ENCOUNTER — Encounter: Payer: Self-pay | Admitting: Cardiology

## 2014-09-21 ENCOUNTER — Ambulatory Visit (INDEPENDENT_AMBULATORY_CARE_PROVIDER_SITE_OTHER): Payer: Commercial Managed Care - HMO | Admitting: Cardiology

## 2014-09-21 VITALS — BP 126/68 | HR 70 | Ht 63.0 in | Wt 245.0 lb

## 2014-09-21 DIAGNOSIS — E782 Mixed hyperlipidemia: Secondary | ICD-10-CM | POA: Diagnosis not present

## 2014-09-21 DIAGNOSIS — I10 Essential (primary) hypertension: Secondary | ICD-10-CM | POA: Diagnosis not present

## 2014-09-21 DIAGNOSIS — R9431 Abnormal electrocardiogram [ECG] [EKG]: Secondary | ICD-10-CM | POA: Diagnosis not present

## 2014-09-21 DIAGNOSIS — R072 Precordial pain: Secondary | ICD-10-CM | POA: Diagnosis not present

## 2014-09-21 NOTE — Progress Notes (Signed)
Cardiology Office Note  Date: 09/21/2014   ID: Valoree, Agent [birth year], MRN [id number]  PCP: Syliva Overman, MD  Primary Cardiologist: Nona Dell, MD   Chief Complaint  Patient presents with  . Exertional fatigue    History of Present Illness: Patricia Fuentes is a 69 y.o. female referred for cardiology consultation by Dr. Lodema Hong. She reports a history of left sided thoracic discomfort that occurred a few months ago when she was doing her typical ADLs. Since that time she has not had recurring symptoms,but has chronic shortness of breath with activities that has been somewhat progressive. She works in Pharmacologist, previously worked for 30 years at [employer], most recently at the [employer].  She does have a history of premature vascular disease in her family, personal risk factors including hypertension and hyperlipidemia. She has not undergone any prior objective ischemic testing.  Recent ECG reviewed below with nonspecific ST-T wave abnormalities.  Other functional limitation includes chronic knee pain, had previous right knee replacement, and chronic arthritic pain on the left.  Past Medical History  Diagnosis Date  . Chronic back pain   . Osteoarthritis   . Obesity, unspecified   . Essential hypertension   . Hyperlipidemia     Past Surgical History  Procedure Laterality Date  . Knee arthroscopy Left   . Total knee arthroplasty Right 2011  . Total abdominal hysterectomy  1995 approx    Fibroids    Current Outpatient Prescriptions  Medication Sig Dispense Refill  . amLODipine (NORVASC) 2.5 MG tablet One tablet daily at 9pm each nighht for blood pressure 30 tablet 5  . Aspirin-Caffeine (BC FAST PAIN RELIEF PO) Take 1 packet by mouth daily.    . ondansetron (ZOFRAN ODT) 4 MG disintegrating tablet Take 1 tablet (4 mg total) by mouth every 8 (eight) hours as needed for nausea. 6 tablet 0  . triamterene-hydrochlorothiazide (MAXZIDE-25) 37.5-25 MG per  tablet TAKE ONE TABLET BY MOUTH ONCE DAILY. 30 tablet 2   No current facility-administered medications for this visit.    Allergies:  Promethazine hcl   Social History: The patient  reports that she has never smoked. She does not have any smokeless tobacco history on file. She reports that she does not drink alcohol or use illicit drugs.   Family History: The patient's family history includes Cancer (age of onset: 11) in her father; Diabetes in her father; Heart disease in her paternal aunt; Hypertension in her brother, brother, brother, and mother; Stroke (age of onset: 56) in her mother.   ROS:  Please see the history of present illness. Otherwise, complete review of systems is positive for none.  All other systems are reviewed and negative.   Physical Exam: VS:  BP 126/68 mmHg  Pulse 70  Ht  (1.6 m)  Wt 245 lb (111.131 kg)  BMI 43.41 kg/m2, BMI Body mass index is 43.41 kg/(m^2).  Wt Readings from Last 3 Encounters:  09/21/14 245 lb (111.131 kg)  08/03/14 249 lb (112.946 kg)  07/18/14 248 lb 0.6 oz (112.51 kg)     General: Overweight woman, appears comfortable at rest. HEENT: Conjunctiva and lids normal, oropharynx clear with moist mucosa. Neck: Supple, no elevated JVP or carotid bruits, no thyromegaly. Lungs: Clear to auscultation, nonlabored breathing at rest. Cardiac: Regular rate and rhythm, no S3 or significant systolic murmur, no pericardial rub. Abdomen: Soft, nontender, bowel sounds present, no guarding or rebound. Extremities: No pitting edema, distal pulses 2+. Skin: Warm  and dry. Musculoskeletal: No kyphosis. Neuropsychiatric: Alert and oriented x3, affect grossly appropriate.   ECG: Tracing from 07/18/2014 showed sinus rhythm with nonspecific ST-T wave abnormalities.  Recent Labwork: 12/27/2013: ALT 11; AST 14; TSH 1.892 08/03/2014: BUN 19; Creatinine 1.16*; Hemoglobin 11.7*; Platelets 181; Potassium 3.4*; Sodium 141     Component Value Date/Time   CHOL 173  12/27/2013 1418   TRIG 80 12/27/2013 1418   HDL 53 12/27/2013 1418   CHOLHDL 3.3 12/27/2013 1418   VLDL 16 12/27/2013 1418   LDLCALC 104* 12/27/2013 1418    Other Studies Reviewed Today:  Echocardiogram 12/30/2008: Study Conclusions  1. Left ventricle: Wall thickness was increased in a pattern of mild  LVH. The estimated ejection fraction was 65%. Wall motion was  normal; there were no regional wall motion abnormalities. 2. Pulmonary arteries: PA peak pressure: 34mm Hg (S).   ASSESSMENT AND PLAN:  1. Status post episode of left thoracic discomfort, chronic somewhat progressive shortness of breath with exertion as well. Recent resting ECG showed nonspecific ST-T wave abnormalities. She has a family history of premature vascular disease, personal risk factors including hypertension and hyperlipidemia. We have discussed further risk stratification and will arrange a Lexiscan Cardiolite for further assessment.  2. Essential hypertension, on Norvasc and Maxide. Blood pressure control is reasonable today.  3. Hyperlipidemia, on Lipitor, followed by Dr. Lodema HongSimpson. Last LDL was 104.  Current medicines were reviewed at length with the patient today.   Orders Placed This Encounter  Procedures  . Myocardial Perfusion Imaging    Disposition: Call with results.   Signed, Jonelle SidleSamuel G. Rourke Mcquitty, MD, [facility] 09/21/2014 2:02 PM    [facility] [address], [place], [place] [address] Phone: [phone]; Fax: [phone]

## 2014-09-21 NOTE — Patient Instructions (Signed)
Your physician recommends that you schedule a follow-up appointment in: to be determined after testing  Your physician has requested that you have a lexiscan myoview. For further information please visit [email or web address]. Please follow instruction sheet, as given.     Thank you for choosing [facility] !

## 2014-09-29 ENCOUNTER — Inpatient Hospital Stay (HOSPITAL_COMMUNITY): Admission: RE | Admit: 2014-09-29 | Payer: Commercial Managed Care - HMO | Source: Ambulatory Visit

## 2014-09-30 ENCOUNTER — Other Ambulatory Visit: Payer: Self-pay | Admitting: Cardiology

## 2014-09-30 DIAGNOSIS — R079 Chest pain, unspecified: Secondary | ICD-10-CM

## 2014-10-05 ENCOUNTER — Encounter (HOSPITAL_COMMUNITY): Payer: Commercial Managed Care - HMO

## 2014-10-18 ENCOUNTER — Encounter (HOSPITAL_COMMUNITY)
Admission: RE | Admit: 2014-10-18 | Discharge: 2014-10-18 | Disposition: A | Discharge status: Home / Self Care | Payer: Commercial Managed Care - HMO | Source: Ambulatory Visit | Attending: Cardiology | Admitting: Cardiology

## 2014-10-18 ENCOUNTER — Encounter (HOSPITAL_COMMUNITY): Payer: Self-pay

## 2014-10-18 ENCOUNTER — Inpatient Hospital Stay (HOSPITAL_COMMUNITY): Admission: RE | Admit: 2014-10-18 | Payer: Commercial Managed Care - HMO | Source: Ambulatory Visit

## 2014-10-18 DIAGNOSIS — R079 Chest pain, unspecified: Secondary | ICD-10-CM | POA: Diagnosis not present

## 2014-10-18 LAB — NM MYOCAR MULTI W/SPECT W/WALL MOTION / EF
CHL CUP NUCLEAR SSS: 9
CSEPPHR: 95 {beats}/min
LHR: 0.35
LV sys vol: 95 mL
LVDIAVOL: 34 mL
NUC STRESS EF: 64 %
NUC STRESS TID: 1.08
Rest HR: 59 {beats}/min
SDS: 7
SRS: 2

## 2014-10-18 MED ORDER — TECHNETIUM TC 99M SESTAMIBI - CARDIOLITE: 30.0000 | Freq: Once | INTRAVENOUS | Status: AC | PRN

## 2014-10-18 MED ORDER — REGADENOSON 0.4 MG/5ML IV SOLN
INTRAVENOUS | Status: AC
  Administered 2014-10-18: 0.4 mg via INTRAVENOUS
  Filled 2014-10-18: qty 5

## 2014-10-18 MED ORDER — TECHNETIUM TC 99M SESTAMIBI GENERIC - CARDIOLITE: 10.0000 | Freq: Once | INTRAVENOUS | Status: AC | PRN

## 2014-10-18 MED ORDER — SODIUM CHLORIDE 0.9 % IJ SOLN
10.0000 mL | INTRAMUSCULAR | Status: DC | PRN
Start: 2014-10-18 — End: 2014-10-24
  Administered 2014-10-18: 10 mL via INTRAVENOUS
  Filled 2014-10-18: qty 10

## 2014-10-18 MED ORDER — REGADENOSON 0.4 MG/5ML IV SOLN
0.4000 mg | Freq: Once | INTRAVENOUS | Status: AC
  Administered 2014-10-18: 0.4 mg via INTRAVENOUS
  Filled 2014-10-18: qty 5

## 2014-10-18 MED ORDER — SODIUM CHLORIDE 0.9 % IJ SOLN
INTRAMUSCULAR | Status: AC
  Filled 2014-10-18: qty 3

## 2014-10-19 ENCOUNTER — Ambulatory Visit (INDEPENDENT_AMBULATORY_CARE_PROVIDER_SITE_OTHER): Payer: Commercial Managed Care - HMO | Admitting: Cardiology

## 2014-10-19 ENCOUNTER — Ambulatory Visit (HOSPITAL_COMMUNITY)
Admission: RE | Admit: 2014-10-19 | Discharge: 2014-10-19 | Disposition: A | Discharge status: Home / Self Care | Payer: Commercial Managed Care - HMO | Source: Ambulatory Visit | Attending: Cardiology | Admitting: Cardiology

## 2014-10-19 ENCOUNTER — Encounter: Payer: Self-pay | Admitting: Cardiology

## 2014-10-19 ENCOUNTER — Other Ambulatory Visit: Payer: Self-pay | Admitting: Cardiology

## 2014-10-19 ENCOUNTER — Other Ambulatory Visit (HOSPITAL_COMMUNITY)
Admission: RE | Admit: 2014-10-19 | Discharge: 2014-10-19 | Disposition: A | Discharge status: Home / Self Care | Payer: Commercial Managed Care - HMO | Source: Ambulatory Visit | Attending: Cardiology | Admitting: Cardiology

## 2014-10-19 ENCOUNTER — Encounter: Payer: Self-pay | Admitting: *Deleted

## 2014-10-19 VITALS — BP 118/68 | HR 64 | Ht 62.0 in | Wt 248.0 lb

## 2014-10-19 DIAGNOSIS — R079 Chest pain, unspecified: Secondary | ICD-10-CM | POA: Diagnosis not present

## 2014-10-19 DIAGNOSIS — Z01818 Encounter for other preprocedural examination: Secondary | ICD-10-CM | POA: Insufficient documentation

## 2014-10-19 DIAGNOSIS — Z8249 Family history of ischemic heart disease and other diseases of the circulatory system: Secondary | ICD-10-CM | POA: Insufficient documentation

## 2014-10-19 DIAGNOSIS — Z79899 Other long term (current) drug therapy: Secondary | ICD-10-CM | POA: Diagnosis not present

## 2014-10-19 DIAGNOSIS — I2510 Atherosclerotic heart disease of native coronary artery without angina pectoris: Secondary | ICD-10-CM | POA: Diagnosis not present

## 2014-10-19 DIAGNOSIS — R9439 Abnormal result of other cardiovascular function study: Secondary | ICD-10-CM

## 2014-10-19 DIAGNOSIS — I10 Essential (primary) hypertension: Secondary | ICD-10-CM

## 2014-10-19 DIAGNOSIS — R0602 Shortness of breath: Secondary | ICD-10-CM | POA: Insufficient documentation

## 2014-10-19 DIAGNOSIS — R072 Precordial pain: Secondary | ICD-10-CM | POA: Diagnosis not present

## 2014-10-19 LAB — CBC WITH DIFFERENTIAL/PLATELET
Basophils Absolute: 0 10*3/uL (ref 0.0–0.1)
Basophils Relative: 0 % (ref 0–1)
EOS PCT: 5 % (ref 0–5)
Eosinophils Absolute: 0.3 10*3/uL (ref 0.0–0.7)
HCT: 36.3 % (ref 36.0–46.0)
HEMOGLOBIN: 11.5 g/dL — AB (ref 12.0–15.0)
LYMPHS ABS: 2.2 10*3/uL (ref 0.7–4.0)
LYMPHS PCT: 33 % (ref 12–46)
MCH: 28.2 pg (ref 26.0–34.0)
MCHC: 31.7 g/dL (ref 30.0–36.0)
MCV: 89 fL (ref 78.0–100.0)
MONO ABS: 0.6 10*3/uL (ref 0.1–1.0)
MONOS PCT: 9 % (ref 3–12)
NEUTROS PCT: 53 % (ref 43–77)
Neutro Abs: 3.6 10*3/uL (ref 1.7–7.7)
Platelets: 200 10*3/uL (ref 150–400)
RBC: 4.08 MIL/uL (ref 3.87–5.11)
RDW: 14.1 % (ref 11.5–15.5)
WBC: 6.6 10*3/uL (ref 4.0–10.5)

## 2014-10-19 LAB — PROTIME-INR
INR: 1.09 (ref 0.00–1.49)
Prothrombin Time: 14.3 seconds (ref 11.6–15.2)

## 2014-10-19 LAB — BASIC METABOLIC PANEL
Anion gap: 5 (ref 5–15)
BUN: 13 mg/dL (ref 6–20)
CHLORIDE: 101 mmol/L (ref 101–111)
CO2: 32 mmol/L (ref 22–32)
CREATININE: 0.8 mg/dL (ref 0.44–1.00)
Calcium: 8.2 mg/dL — ABNORMAL LOW (ref 8.9–10.3)
GFR calc Af Amer: >60 mL/min (ref >60)
Glucose, Bld: 108 mg/dL — ABNORMAL HIGH (ref 65–99)
POTASSIUM: 3.3 mmol/L — AB (ref 3.5–5.1)
Sodium: 138 mmol/L (ref 135–145)

## 2014-10-19 MED ORDER — ASPIRIN EC 81 MG PO TBEC: 81.0000 mg | DELAYED_RELEASE_TABLET | Freq: Every day | ORAL | Status: DC

## 2014-10-19 NOTE — Patient Instructions (Addendum)
Your physician has requested that you have a cardiac catheterization. Cardiac catheterization is used to diagnose and/or treat various heart conditions. Doctors may recommend this procedure for a number of different reasons. The most common reason is to evaluate chest pain. Chest pain can be a symptom of coronary artery disease (CAD), and cardiac catheterization can show whether plaque is narrowing or blocking your heart's arteries. This procedure is also used to evaluate the valves, as well as measure the blood flow and oxygen levels in different parts of your heart. For further information please visit [email or web address]. Please follow instruction sheet, as given.  Your physician has recommended you make the following change in your medication:   STOP Taking BC Fast Pain Relief   Start Taking Aspirin 81 mg Daily   Thank you for choosing [facility]!

## 2014-10-19 NOTE — Progress Notes (Signed)
  Cardiology Office Note  Date: 10/19/2014   ID: Emiliana A Pineo, DOB 04/01/1946, MRN [id number]  PCP: Margaret Simpson, MD  Primary Cardiologist: Samuel McDowell, MD   Chief Complaint  Patient presents with  . Follow-up testing    History of Present Illness: Patricia Fuentes is a 69 y.o. female seen in consultation back in late April for evaluation of progressive shortness of breath as well as a prior episode of left thoracic discomfort. With cardiac risk factors including hypertension, hyperlipidemia, and family history, she was referred for a Lexiscan Cardiolite. This study showed breast attenuation as well as evidence of possible mid to apical anterior/anterolateral ischemia, LVEF 64%. She comes back in today to discuss the results.  She denies having further chest discomfort, but remains short of breath and fatigued with activities. She continues to work full-time however. We discussed the results of her stress testing, potential for LAD distribution ischemia. We discussed proceeding to a diagnostic cardiac catheterization to most clearly evaluate her coronary anatomy and determine if any revascularization options need to be considered. We discussed the potential risks and benefits, and she is in agreement to proceed.   Past Medical History  Diagnosis Date  . Chronic back pain   . Osteoarthritis   . Obesity, unspecified   . Essential hypertension   . Hyperlipidemia     Past Surgical History  Procedure Laterality Date  . Knee arthroscopy Left   . Total knee arthroplasty Right 2011  . Total abdominal hysterectomy  1995 approx    Fibroids    Current Outpatient Prescriptions  Medication Sig Dispense Refill  . amLODipine (NORVASC) 2.5 MG tablet One tablet daily at 9pm each nighht for blood pressure 30 tablet 5  . triamterene-hydrochlorothiazide (MAXZIDE-25) 37.5-25 MG per tablet TAKE ONE TABLET BY MOUTH ONCE DAILY. 30 tablet 2  . aspirin EC 81 MG tablet Take 1 tablet (81  mg total) by mouth daily. 90 tablet 3   No current facility-administered medications for this visit.   Facility-Administered Medications Ordered in Other Visits  Medication Dose Route Frequency Provider Last Rate Last Dose  . sodium chloride 0.9 % injection 10 mL  10 mL Intravenous PRN Kathryn M Lawrence, NP   10 mL at 10/18/14 1146    Allergies:  Promethazine hcl   Social History: The patient  reports that she has never smoked. She does not have any smokeless tobacco history on file. She reports that she does not drink alcohol or use illicit drugs.   Family History: The patient's family history includes Cancer (age of onset: 72) in her father; Diabetes in her father; Heart disease in her paternal aunt; Hypertension in her brother, brother, brother, and mother; Stroke (age of onset: 59) in her mother.   ROS:  Please see the history of present illness. Otherwise, complete review of systems is positive for arthritic pains.  All other systems are reviewed and negative.   Physical Exam: VS:  BP 118/68 mmHg  Pulse 64  Ht 5' 2" (1.575 m)  Wt 248 lb (112.492 kg)  BMI 45.35 kg/m2  SpO2 99%, BMI Body mass index is 45.35 kg/(m^2).  Wt Readings from Last 3 Encounters:  10/19/14 248 lb (112.492 kg)  09/21/14 245 lb (111.131 kg)  08/03/14 249 lb (112.946 kg)     General: Overweight woman, appears comfortable at rest. HEENT: Conjunctiva and lids normal, oropharynx clear with moist mucosa. Neck: Supple, no elevated JVP or carotid bruits, no thyromegaly. Lungs: Clear to auscultation, nonlabored breathing   at rest. Cardiac: Regular rate and rhythm, no S3 or significant systolic murmur, no pericardial rub. Abdomen: Soft, nontender, bowel sounds present, no guarding or rebound. Extremities: No pitting edema, distal pulses 2+. Skin: Warm and dry. Musculoskeletal: No kyphosis. Neuropsychiatric: Alert and oriented x3, affect grossly appropriate.   ECG: ECG is not ordered today.  Recent  Labwork: 12/27/2013: ALT 11; AST 14; TSH 1.892 08/03/2014: BUN 19; Creatinine 1.16*; Hemoglobin 11.7*; Platelets 181; Potassium 3.4*; Sodium 141     Component Value Date/Time   CHOL 173 12/27/2013 1418   TRIG 80 12/27/2013 1418   HDL 53 12/27/2013 1418   CHOLHDL 3.3 12/27/2013 1418   VLDL 16 12/27/2013 1418   LDLCALC 104* 12/27/2013 1418    ASSESSMENT AND PLAN:  1. Somewhat progressive shortness of breath and fatigue with activity following episode of chest discomfort within the last few months. Recent Cardiolite study was abnormal showing both breast attenuation, and also possible mid to apical LAD distribution ischemia. In light of her symptoms, cardiac risk factors, and abnormal noninvasive cardiac imaging, plan is now to proceed with a diagnostic cardiac catheterization to clearly evaluate the coronary anatomy and determine if any revascularization options need to be considered.  2. Hypertension, blood pressure normal today.  Current medicines were reviewed at length with the patient today.   Disposition: FU with me after cardiac catheterization.   Signed, Samuel G. McDowell, MD, FACC 10/19/2014 2:36 PM    [facility] at [place] [address], [place], [place] [address] Phone: [phone]; Fax: [phone] 

## 2014-10-25 ENCOUNTER — Encounter (HOSPITAL_COMMUNITY): Payer: Self-pay | Admitting: Cardiovascular Disease

## 2014-10-25 ENCOUNTER — Encounter (HOSPITAL_COMMUNITY)
Admission: RE | Disposition: A | Discharge status: Home / Self Care | Payer: Commercial Managed Care - HMO | Source: Ambulatory Visit | Attending: Cardiovascular Disease

## 2014-10-25 ENCOUNTER — Ambulatory Visit (HOSPITAL_COMMUNITY)
Admission: RE | Admit: 2014-10-25 | Discharge: 2014-10-25 | Disposition: A | Discharge status: Home / Self Care | Payer: Commercial Managed Care - HMO | Source: Ambulatory Visit | Attending: Cardiovascular Disease | Admitting: Cardiovascular Disease

## 2014-10-25 DIAGNOSIS — Z7982 Long term (current) use of aspirin: Secondary | ICD-10-CM | POA: Diagnosis not present

## 2014-10-25 DIAGNOSIS — R931 Abnormal findings on diagnostic imaging of heart and coronary circulation: Secondary | ICD-10-CM | POA: Insufficient documentation

## 2014-10-25 DIAGNOSIS — I10 Essential (primary) hypertension: Secondary | ICD-10-CM | POA: Diagnosis not present

## 2014-10-25 DIAGNOSIS — R9439 Abnormal result of other cardiovascular function study: Secondary | ICD-10-CM

## 2014-10-25 DIAGNOSIS — Z90710 Acquired absence of both cervix and uterus: Secondary | ICD-10-CM | POA: Insufficient documentation

## 2014-10-25 DIAGNOSIS — I340 Nonrheumatic mitral (valve) insufficiency: Secondary | ICD-10-CM | POA: Insufficient documentation

## 2014-10-25 DIAGNOSIS — E785 Hyperlipidemia, unspecified: Secondary | ICD-10-CM | POA: Diagnosis not present

## 2014-10-25 DIAGNOSIS — R0600 Dyspnea, unspecified: Secondary | ICD-10-CM

## 2014-10-25 DIAGNOSIS — M1990 Unspecified osteoarthritis, unspecified site: Secondary | ICD-10-CM | POA: Diagnosis not present

## 2014-10-25 DIAGNOSIS — Z96651 Presence of right artificial knee joint: Secondary | ICD-10-CM | POA: Insufficient documentation

## 2014-10-25 DIAGNOSIS — Z6841 Body Mass Index (BMI) 40.0 and over, adult: Secondary | ICD-10-CM | POA: Diagnosis not present

## 2014-10-25 DIAGNOSIS — E669 Obesity, unspecified: Secondary | ICD-10-CM | POA: Insufficient documentation

## 2014-10-25 DIAGNOSIS — R0609 Other forms of dyspnea: Secondary | ICD-10-CM | POA: Insufficient documentation

## 2014-10-25 DIAGNOSIS — Z888 Allergy status to other drugs, medicaments and biological substances status: Secondary | ICD-10-CM | POA: Insufficient documentation

## 2014-10-25 HISTORY — PX: CARDIAC CATHETERIZATION: SHX172

## 2014-10-25 LAB — BASIC METABOLIC PANEL
ANION GAP: 7 (ref 5–15)
BUN: 16 mg/dL (ref 6–20)
CO2: 31 mmol/L (ref 22–32)
Calcium: 8.5 mg/dL — ABNORMAL LOW (ref 8.9–10.3)
Chloride: 99 mmol/L — ABNORMAL LOW (ref 101–111)
Creatinine, Ser: 0.85 mg/dL (ref 0.44–1.00)
GFR calc Af Amer: >60 mL/min (ref >60)
GFR calc non Af Amer: >60 mL/min (ref >60)
GLUCOSE: 98 mg/dL (ref 65–99)
Potassium: 3.6 mmol/L (ref 3.5–5.1)
SODIUM: 137 mmol/L (ref 135–145)

## 2014-10-25 SURGERY — LEFT HEART CATH AND CORONARY ANGIOGRAPHY
Anesthesia: LOCAL

## 2014-10-25 MED ORDER — VERAPAMIL HCL 2.5 MG/ML IV SOLN
INTRAVENOUS | Status: AC
  Filled 2014-10-25: qty 2

## 2014-10-25 MED ORDER — HEPARIN (PORCINE) IN NACL 2-0.9 UNIT/ML-% IJ SOLN
INTRAMUSCULAR | Status: AC
  Filled 2014-10-25: qty 500

## 2014-10-25 MED ORDER — LIDOCAINE HCL (PF) 1 % IJ SOLN
INTRAMUSCULAR | Status: AC
  Filled 2014-10-25: qty 30

## 2014-10-25 MED ORDER — SODIUM CHLORIDE 0.9 % IJ SOLN: 3.0000 mL | INTRAMUSCULAR | Status: DC | PRN

## 2014-10-25 MED ORDER — SODIUM CHLORIDE 0.9 % IV SOLN: INTRAVENOUS | Status: AC

## 2014-10-25 MED ORDER — NITROGLYCERIN 1 MG/10 ML FOR IR/CATH LAB
INTRA_ARTERIAL | Status: AC
  Filled 2014-10-25: qty 10

## 2014-10-25 MED ORDER — SODIUM CHLORIDE 0.9 % IV SOLN
250.0000 mL | INTRAVENOUS | Status: DC | PRN
Start: 2014-10-25 — End: 2014-10-25

## 2014-10-25 MED ORDER — FENTANYL CITRATE (PF) 100 MCG/2ML IJ SOLN
INTRAMUSCULAR | Status: DC | PRN
  Administered 2014-10-25: 50 ug via INTRAVENOUS

## 2014-10-25 MED ORDER — FENTANYL CITRATE (PF) 100 MCG/2ML IJ SOLN
INTRAMUSCULAR | Status: AC
  Filled 2014-10-25: qty 2

## 2014-10-25 MED ORDER — ASPIRIN 81 MG PO CHEW
324.0000 mg | CHEWABLE_TABLET | ORAL | Status: AC
  Administered 2014-10-25: 324 mg via ORAL

## 2014-10-25 MED ORDER — SODIUM CHLORIDE 0.9 % IJ SOLN: 3.0000 mL | Freq: Two times a day (BID) | INTRAMUSCULAR | Status: DC

## 2014-10-25 MED ORDER — MIDAZOLAM HCL 2 MG/2ML IJ SOLN
INTRAMUSCULAR | Status: DC | PRN
  Administered 2014-10-25: 2 mg via INTRAVENOUS

## 2014-10-25 MED ORDER — IOHEXOL 350 MG/ML SOLN
INTRAVENOUS | Status: DC | PRN
  Administered 2014-10-25: 75 mL via INTRA_ARTERIAL

## 2014-10-25 MED ORDER — HEPARIN (PORCINE) IN NACL 2-0.9 UNIT/ML-% IJ SOLN
INTRAMUSCULAR | Status: AC
  Filled 2014-10-25: qty 1000

## 2014-10-25 MED ORDER — ASPIRIN 81 MG PO CHEW
CHEWABLE_TABLET | ORAL | Status: AC
  Administered 2014-10-25: 324 mg via ORAL
  Filled 2014-10-25: qty 1

## 2014-10-25 MED ORDER — LIDOCAINE HCL (PF) 1 % IJ SOLN
INTRAMUSCULAR | Status: DC | PRN
  Administered 2014-10-25: 5 mL via INTRADERMAL

## 2014-10-25 MED ORDER — MIDAZOLAM HCL 2 MG/2ML IJ SOLN
INTRAMUSCULAR | Status: AC
  Filled 2014-10-25: qty 2

## 2014-10-25 MED ORDER — VERAPAMIL HCL 2.5 MG/ML IV SOLN
INTRAVENOUS | Status: DC | PRN
  Administered 2014-10-25: 09:00:00 via INTRA_ARTERIAL

## 2014-10-25 MED ORDER — HEPARIN SODIUM (PORCINE) 1000 UNIT/ML IJ SOLN
INTRAMUSCULAR | Status: AC
  Filled 2014-10-25: qty 1

## 2014-10-25 MED ORDER — HEPARIN SODIUM (PORCINE) 1000 UNIT/ML IJ SOLN
INTRAMUSCULAR | Status: DC | PRN
  Administered 2014-10-25: 5500 [IU] via INTRAVENOUS

## 2014-10-25 MED ORDER — SODIUM CHLORIDE 0.9 % IV SOLN
INTRAVENOUS | Status: DC | PRN
  Administered 2014-10-25: 108 mL via INTRAVENOUS

## 2014-10-25 MED ORDER — SODIUM CHLORIDE 0.9 % IV SOLN: 250.0000 mL | INTRAVENOUS | Status: DC | PRN

## 2014-10-25 MED ORDER — SODIUM CHLORIDE 0.9 % IV SOLN
INTRAVENOUS | Status: DC
Start: 2014-10-25 — End: 2014-10-25
  Administered 2014-10-25: 1000 mL via INTRAVENOUS

## 2014-10-25 SURGICAL SUPPLY — 12 items
CATH INFINITI 5 FR JL3.5 (CATHETERS) ×2 IMPLANT
CATH INFINITI 5FR ANG PIGTAIL (CATHETERS) ×2 IMPLANT
CATH INFINITI JR4 5F (CATHETERS) ×2 IMPLANT
DEVICE RAD COMP TR BAND LRG (VASCULAR PRODUCTS) ×2 IMPLANT
GLIDESHEATH SLEND SS 6F .021 (SHEATH) ×2 IMPLANT
KIT ENCORE 26 ADVANTAGE (KITS) IMPLANT
KIT HEART LEFT (KITS) ×2 IMPLANT
PACK CARDIAC CATHETERIZATION (CUSTOM PROCEDURE TRAY) ×2 IMPLANT
SYR MEDRAD MARK V 150ML (SYRINGE) ×2 IMPLANT
TRANSDUCER W/STOPCOCK (MISCELLANEOUS) ×2 IMPLANT
TUBING CIL FLEX 10 FLL-RA (TUBING) ×2 IMPLANT
WIRE SAFE-T 1.5MM-J .035X260CM (WIRE) ×2 IMPLANT

## 2014-10-25 NOTE — Discharge Instructions (Signed)
Radial Site Care Refer to this sheet in the next few weeks. These instructions provide you with information on caring for yourself after your procedure. Your caregiver may also give you more specific instructions. Your treatment has been planned according to current medical practices, but problems sometimes occur. Call your caregiver if you have any problems or questions after your procedure. HOME CARE INSTRUCTIONS  You may shower the day after the procedure.Remove the bandage (dressing) and gently wash the site with plain soap and water.Gently pat the site dry.  Do not apply powder or lotion to the site.  Do not submerge the affected site in water for 3 to 5 days.  Inspect the site at least twice daily.  Do not flex or bend the affected arm for 24 hours.  No lifting over 5 pounds (2.3 kg) for 5 days after your procedure.  Do not drive home if you are discharged the same day of the procedure. Have someone else drive you.  You may drive 24 hours after the procedure unless otherwise instructed by your caregiver.  Do not operate machinery or power tools for 24 hours.  A responsible adult should be with you for the first 24 hours after you arrive home. What to expect:  Any bruising will usually fade within 1 to 2 weeks.  Blood that collects in the tissue (hematoma) may be painful to the touch. It should usually decrease in size and tenderness within 1 to 2 weeks. SEEK IMMEDIATE MEDICAL CARE IF:  You have unusual pain at the radial site.  You have redness, warmth, swelling, or pain at the radial site.  You have drainage (other than a small amount of blood on the dressing).  You have chills.  You have a fever or persistent symptoms for more than 72 hours.  You have a fever and your symptoms suddenly get worse.  Your arm becomes pale, cool, tingly, or numb.  You have heavy bleeding from the site. Hold pressure on the site and call 911. Document Released: 06/15/2010 Document  Revised: 08/05/2011 Document Reviewed: 06/15/2010 [facility] Patient Information 2015 [employer], [place]. This information is not intended to replace advice given to you by your health care provider. Make sure you discuss any questions you have with your health care provider.                          Return To Work __[name]_____ was treated at our facility. 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 Your employer should be aware of your condition and can help with the necessary work activity restrictions. If you wish to return to work sooner than the date above, or if you have further problems which make it difficult for you to return at that time, please call us or your caregiver. _Dr. Cristal Deerhristopher McAlhany____ Physician Name (Printed) _________________________________________ Physician Signature  __05/31/2016___ Date Document Released: 05/13/2005 Document Revised: 08/05/2011 Document Reviewed: 10/28/2006 ExitCare Patient Information 2015 [employer], [place]. This information is not intended to replace advice given to you by  your health care provider. Make sure you discuss any questions you have with your health care provider.

## 2014-10-25 NOTE — Interval H&P Note (Signed)
History and Physical Interval Note:  10/25/2014 8:11 AM  Patricia Fuentes  has presented today for cardiac catheterization with the diagnosis of dyspnea/unstable angina/abnormal stress myoview  The various methods of treatment have been discussed with the patient and family. After consideration of risks, benefits and other options for treatment, the patient has consented to  Procedure(s): Left Heart Cath and Coronary Angiography (N/A) as a surgical intervention .  The patient's history has been reviewed, patient examined, no change in status, stable for surgery.  I have reviewed the patient's chart and labs.  Questions were answered to the patient's satisfaction.    Cath Lab Visit (complete for each Cath Lab visit)  Clinical Evaluation Leading to the Procedure:   ACS: No.  Non-ACS:    Anginal Classification: CCS III  Anti-ischemic medical therapy: Minimal Therapy (1 class of medications)  Non-Invasive Test Results: Intermediate-risk stress test findings: cardiac mortality 1-3%/year  Prior CABG: No previous CABG         Eero Dini

## 2014-10-25 NOTE — H&P (View-Only) (Signed)
Cardiology Office Note  Date: 10/19/2014   ID: Hala, Narula 29-Feb-1946, MRN [id number]  PCP: Syliva Overman, MD  Primary Cardiologist: Nona Dell, MD   Chief Complaint  Patient presents with  . Follow-up testing    History of Present Illness: Patricia Fuentes is a 69 y.o. female seen in consultation back in late April for evaluation of progressive shortness of breath as well as a prior episode of left thoracic discomfort. With cardiac risk factors including hypertension, hyperlipidemia, and family history, she was referred for a Lexiscan Cardiolite. This study showed breast attenuation as well as evidence of possible mid to apical anterior/anterolateral ischemia, LVEF 64%. She comes back in today to discuss the results.  She denies having further chest discomfort, but remains short of breath and fatigued with activities. She continues to work full-time however. We discussed the results of her stress testing, potential for LAD distribution ischemia. We discussed proceeding to a diagnostic cardiac catheterization to most clearly evaluate her coronary anatomy and determine if any revascularization options need to be considered. We discussed the potential risks and benefits, and she is in agreement to proceed.   Past Medical History  Diagnosis Date  . Chronic back pain   . Osteoarthritis   . Obesity, unspecified   . Essential hypertension   . Hyperlipidemia     Past Surgical History  Procedure Laterality Date  . Knee arthroscopy Left   . Total knee arthroplasty Right 2011  . Total abdominal hysterectomy  1995 approx    Fibroids    Current Outpatient Prescriptions  Medication Sig Dispense Refill  . amLODipine (NORVASC) 2.5 MG tablet One tablet daily at 9pm each nighht for blood pressure 30 tablet 5  . triamterene-hydrochlorothiazide (MAXZIDE-25) 37.5-25 MG per tablet TAKE ONE TABLET BY MOUTH ONCE DAILY. 30 tablet 2  . aspirin EC 81 MG tablet Take 1 tablet (81  mg total) by mouth daily. 90 tablet 3   No current facility-administered medications for this visit.   Facility-Administered Medications Ordered in Other Visits  Medication Dose Route Frequency Provider Last Rate Last Dose  . sodium chloride 0.9 % injection 10 mL  10 mL Intravenous PRN Jodelle Gross, NP   10 mL at 10/18/14 1146    Allergies:  Promethazine hcl   Social History: The patient  reports that she has never smoked. She does not have any smokeless tobacco history on file. She reports that she does not drink alcohol or use illicit drugs.   Family History: The patient's family history includes Cancer (age of onset: 83) in her father; Diabetes in her father; Heart disease in her paternal aunt; Hypertension in her brother, brother, brother, and mother; Stroke (age of onset: 86) in her mother.   ROS:  Please see the history of present illness. Otherwise, complete review of systems is positive for arthritic pains.  All other systems are reviewed and negative.   Physical Exam: VS:  BP 118/68 mmHg  Pulse 64  Ht  (1.575 m)  Wt 248 lb (112.492 kg)  BMI 45.35 kg/m2  SpO2 99%, BMI Body mass index is 45.35 kg/(m^2).  Wt Readings from Last 3 Encounters:  10/19/14 248 lb (112.492 kg)  09/21/14 245 lb (111.131 kg)  08/03/14 249 lb (112.946 kg)     General: Overweight woman, appears comfortable at rest. HEENT: Conjunctiva and lids normal, oropharynx clear with moist mucosa. Neck: Supple, no elevated JVP or carotid bruits, no thyromegaly. Lungs: Clear to auscultation, nonlabored breathing  at rest. Cardiac: Regular rate and rhythm, no S3 or significant systolic murmur, no pericardial rub. Abdomen: Soft, nontender, bowel sounds present, no guarding or rebound. Extremities: No pitting edema, distal pulses 2+. Skin: Warm and dry. Musculoskeletal: No kyphosis. Neuropsychiatric: Alert and oriented x3, affect grossly appropriate.   ECG: ECG is not ordered today.  Recent  Labwork: 12/27/2013: ALT 11; AST 14; TSH 1.892 08/03/2014: BUN 19; Creatinine 1.16*; Hemoglobin 11.7*; Platelets 181; Potassium 3.4*; Sodium 141     Component Value Date/Time   CHOL 173 12/27/2013 1418   TRIG 80 12/27/2013 1418   HDL 53 12/27/2013 1418   CHOLHDL 3.3 12/27/2013 1418   VLDL 16 12/27/2013 1418   LDLCALC 104* 12/27/2013 1418    ASSESSMENT AND PLAN:  1. Somewhat progressive shortness of breath and fatigue with activity following episode of chest discomfort within the last few months. Recent Cardiolite study was abnormal showing both breast attenuation, and also possible mid to apical LAD distribution ischemia. In light of her symptoms, cardiac risk factors, and abnormal noninvasive cardiac imaging, plan is now to proceed with a diagnostic cardiac catheterization to clearly evaluate the coronary anatomy and determine if any revascularization options need to be considered.  2. Hypertension, blood pressure normal today.  Current medicines were reviewed at length with the patient today.   Disposition: FU with me after cardiac catheterization.   Signed, Jonelle SidleSamuel G. Raynah Gomes, MD, [facility]ACC 10/19/2014 2:36 PM    [facility] [address], [place], [place] [address] Phone: [phone]; Fax: [phone]

## 2014-10-26 MED FILL — Heparin Sodium (Porcine) 2 Unit/ML in Sodium Chloride 0.9%: INTRAMUSCULAR | Qty: 1500 | Status: AC

## 2014-11-21 ENCOUNTER — Encounter: Payer: Self-pay | Admitting: Family Medicine

## 2014-11-21 ENCOUNTER — Ambulatory Visit (INDEPENDENT_AMBULATORY_CARE_PROVIDER_SITE_OTHER): Payer: Commercial Managed Care - HMO | Admitting: Family Medicine

## 2014-11-21 VITALS — BP 136/74 | HR 80 | Resp 18 | Ht 62.0 in | Wt 247.1 lb

## 2014-11-21 DIAGNOSIS — E8881 Metabolic syndrome: Secondary | ICD-10-CM

## 2014-11-21 DIAGNOSIS — M19012 Primary osteoarthritis, left shoulder: Secondary | ICD-10-CM

## 2014-11-21 DIAGNOSIS — I10 Essential (primary) hypertension: Secondary | ICD-10-CM

## 2014-11-21 DIAGNOSIS — M25512 Pain in left shoulder: Secondary | ICD-10-CM

## 2014-11-21 DIAGNOSIS — Z1211 Encounter for screening for malignant neoplasm of colon: Secondary | ICD-10-CM

## 2014-11-21 DIAGNOSIS — E785 Hyperlipidemia, unspecified: Secondary | ICD-10-CM | POA: Diagnosis not present

## 2014-11-21 DIAGNOSIS — R7303 Prediabetes: Secondary | ICD-10-CM

## 2014-11-21 DIAGNOSIS — R7309 Other abnormal glucose: Secondary | ICD-10-CM

## 2014-11-21 DIAGNOSIS — M129 Arthropathy, unspecified: Secondary | ICD-10-CM

## 2014-11-21 DIAGNOSIS — M1712 Unilateral primary osteoarthritis, left knee: Secondary | ICD-10-CM

## 2014-11-21 DIAGNOSIS — Z13820 Encounter for screening for osteoporosis: Secondary | ICD-10-CM

## 2014-11-21 DIAGNOSIS — M25562 Pain in left knee: Secondary | ICD-10-CM

## 2014-11-21 LAB — HEMOCCULT GUIAC POC 1CARD (OFFICE)
Card #2 Fecal Occult Blod, POC: NEGATIVE
Card #3 Fecal Occult Blood, POC: NEGATIVE
Fecal Occult Blood, POC: NEGATIVE

## 2014-11-21 NOTE — Patient Instructions (Addendum)
F/u in 5 month, call if you need me before  Fasting lipid, chem 7 and EGFR, HBA1C , TSH  August 4 or after  You need to take daily vit D 800 IU this is over the counter  You are referred for bone density test, also to see Dr Aline Brochure for left knee and left shoulder pain  Please work on good  health habits so that your health will improve. 1. Commitment to daily physical activity for 30 to 60  minutes, if you are able to do this.  2. Commitment to wise food choices. Aim for half of your  food intake to be vegetable and fruit, one quarter starchy foods, and one quarter protein. Try to eat on a regular schedule  3 meals per day, snacking between meals should be limited to vegetables or fruits or small portions of nuts. 64 ounces of water per day is generally recommended, unless you have specific health conditions, like heart failure or kidney failure where you will need to limit fluid intake.  3. Commitment to sufficient and a  good quality of physical and mental rest daily, generally between 6 to 8 hours per day.  WITH PERSISTANCE AND PERSEVERANCE, THE IMPOSSIBLE , BECOMES THE NORM!   Call  For colonoscopy when you decide  Weght loss goal of 5 to 8 pounds

## 2014-11-28 NOTE — Assessment & Plan Note (Signed)
Hyperlipidemia:Low fat diet discussed and encouraged.   Lipid Panel  Lab Results  Component Value Date   CHOL 173 12/27/2013   HDL 53 12/27/2013   LDLCALC 104* 12/27/2013   TRIG 80 12/27/2013   CHOLHDL 3.3 12/27/2013   Not at goal, dietary change only

## 2014-11-28 NOTE — Assessment & Plan Note (Signed)
Deteriorated. Patient re-educated about  the importance of commitment to a  minimum of 150 minutes of exercise per week.  The importance of healthy food choices with portion control discussed. Encouraged to start a food diary, count calories and to consider  joining a support group. Sample diet sheets offered. Goals set by the patient for the next several months.   Weight /BMI 11/21/2014 10/25/2014 10/19/2014  WEIGHT 247 lb 1.9 oz 238 lb 248 lb  HEIGHT 5\' 2"  5\' 2"  5\' 2"   BMI 45.19 kg/m2 43.52 kg/m2 45.35 kg/m2    Current exercise per week 40 minutes.

## 2014-11-28 NOTE — Assessment & Plan Note (Signed)
Controlled, no change in medication DASH diet and commitment to daily physical activity for a minimum of 30 minutes discussed and encouraged, as a part of hypertension management. The importance of attaining a healthy weight is also discussed.  BP/Weight 11/21/2014 10/25/2014 10/19/2014 09/21/2014 08/03/2014 07/18/2014 12/27/2013  Systolic BP 136 127 118 126 171 144 140  Diastolic BP 74 61 68 68 78 72 80  Wt. (Lbs) 247.12 238 248 245 249 248.04 250.12  BMI 45.19 43.52 45.35 43.41 45.53 45.36 45.74

## 2014-11-28 NOTE — Progress Notes (Signed)
Patricia Fuentes     MRN: [id number]      DOB: July 24, 1945   HPI Ms. Ferryman is here for follow up and re-evaluation of chronic medical conditions, medication management and review of any available recent lab and radiology data.  Preventive health is updated, specifically  Cancer screening and Immunization.  Still refusing colonscopy Questions or concerns regarding consultations or procedures which the PT has had in the interim are  addressed. The PT denies any adverse reactions to current medications since the last visit.  C/o increased left knee and shoulder pain wants to see ortho  ROS Denies recent fever or chills. Denies sinus pressure, nasal congestion, ear pain or sore throat. Denies chest congestion, productive cough or wheezing. Denies chest pains, palpitations and leg swelling Denies abdominal pain, nausea, vomiting,diarrhea or constipation.   Denies dysuria, frequency, hesitancy or incontinence.  Denies headaches, seizures, numbness, or tingling. Denies depression, anxiety or insomnia. Denies skin break down or rash.   PE  BP 136/74 mmHg  Pulse 80  Resp 18  Ht  (1.575 m)  Wt 247 lb 1.9 oz (112.093 kg)  BMI 45.19 kg/m2  SpO2 94%  Patient alert and oriented and in no cardiopulmonary distress.  HEENT: No facial asymmetry, EOMI,   oropharynx pink and moist.  Neck supple no JVD, no mass.  Chest: Clear to auscultation bilaterally.  CVS: S1, S2 no murmurs, no S3.Regular rate.  ABD: Soft non tender.   Ext: No edema  MS: Adequate ROM spine, decreased in left  Shoulder and knee, .  Skin: Intact, no ulcerations or rash noted.  Psych: Good eye contact, normal affect. Memory intact not anxious or depressed appearing.  CNS: CN 2-12 intact, power,  normal throughout.no focal deficits noted.   Assessment & Plan   Essential hypertension Controlled, no change in medication DASH diet and commitment to daily physical activity for a minimum of 30 minutes  discussed and encouraged, as a part of hypertension management. The importance of attaining a healthy weight is also discussed.  BP/Weight 11/21/2014 10/25/2014 10/19/2014 09/21/2014 08/03/2014 07/18/2014 12/27/2013  Systolic BP 136 127 118 126 171 144 140  Diastolic BP 74 61 68 68 78 72 80  Wt. (Lbs) 247.12 238 248 245 249 248.04 250.12  BMI 45.19 43.52 45.35 43.41 45.53 45.36 45.74        Prediabetes Patient educated about the importance of limiting  Carbohydrate intake , the need to commit to daily physical activity for a minimum of 30 minutes , and to commit weight loss. The fact that changes in all these areas will reduce or eliminate all together the development of diabetes is stressed.  Updated lab needed at/ before next visit.   Diabetic Labs Latest Ref Rng 10/25/2014 10/19/2014 08/03/2014 07/15/2014 12/27/2013  HbA1c <5.7 % - - - 6.1(H) 6.2(H)  Chol 0 - 200 mg/dL - - - - 409  HDL >81 mg/dL - - - - 53  Calc LDL 0 - 99 mg/dL - - - - 191(Y)  Triglycerides <150 mg/dL - - - - 80  Creatinine 0.44 - 1.00 mg/dL 7.82 9.56 2.13(Y) 8.65 0.70   BP/Weight 11/21/2014 10/25/2014 10/19/2014 09/21/2014 08/03/2014 07/18/2014 12/27/2013  Systolic BP 136 127 118 126 171 144 140  Diastolic BP 74 61 68 68 78 72 80  Wt. (Lbs) 247.12 238 248 245 249 248.04 250.12  BMI 45.19 43.52 45.35 43.41 45.53 45.36 45.74   No flowsheet data found.     Arthritis of left  knee Deteriorated with increased pain and stiffness, ortho to treat  Hyperlipidemia LDL goal <100 Hyperlipidemia:Low fat diet discussed and encouraged.   Lipid Panel  Lab Results  Component Value Date   CHOL 173 12/27/2013   HDL 53 12/27/2013   LDLCALC 104* 12/27/2013   TRIG 80 12/27/2013   CHOLHDL 3.3 12/27/2013   Not at goal, dietary change only     Morbid obesity Deteriorated. Patient re-educated about  the importance of commitment to a  minimum of 150 minutes of exercise per week.  The importance of healthy food choices with portion  control discussed. Encouraged to start a food diary, count calories and to consider  joining a support group. Sample diet sheets offered. Goals set by the patient for the next several months.   Weight /BMI 11/21/2014 10/25/2014 10/19/2014  WEIGHT 247 lb 1.9 oz 238 lb 248 lb  HEIGHT 5\' 2"  5\' 2"  5\' 2"   BMI 45.19 kg/m2 43.52 kg/m2 45.35 kg/m2    Current exercise per week 40 minutes.   Metabolic syndrome X The increased risk of cardiovascular disease associated with this diagnosis, and the need to consistently work on lifestyle to change this is discussed. Following  a  heart healthy diet ,commitment to 30 minutes of exercise at least 5 days per week, as well as control of blood sugar and cholesterol , and achieving a healthy weight are all the areas to be addressed .

## 2014-11-28 NOTE — Assessment & Plan Note (Signed)
Deteriorated with increased pain and stiffness, ortho to treat

## 2014-11-28 NOTE — Assessment & Plan Note (Signed)
Patient educated about the importance of limiting  Carbohydrate intake , the need to commit to daily physical activity for a minimum of 30 minutes , and to commit weight loss. The fact that changes in all these areas will reduce or eliminate all together the development of diabetes is stressed.  Updated lab needed at/ before next visit.   Diabetic Labs Latest Ref Rng 10/25/2014 10/19/2014 08/03/2014 07/15/2014 12/27/2013  HbA1c <5.7 % - - - 6.1(H) 6.2(H)  Chol 0 - 200 mg/dL - - - - 409173  HDL >81>39 mg/dL - - - - 53  Calc LDL 0 - 99 mg/dL - - - - 191(Y104(H)  Triglycerides <150 mg/dL - - - - 80  Creatinine 0.44 - 1.00 mg/dL 7.820.85 9.560.80 2.13(Y1.16(H) 8.650.69 0.70   BP/Weight 11/21/2014 10/25/2014 10/19/2014 09/21/2014 08/03/2014 07/18/2014 12/27/2013  Systolic BP 136 127 118 126 171 144 140  Diastolic BP 74 61 68 68 78 72 80  Wt. (Lbs) 247.12 238 248 245 249 248.04 250.12  BMI 45.19 43.52 45.35 43.41 45.53 45.36 45.74   No flowsheet data found.

## 2014-11-28 NOTE — Assessment & Plan Note (Signed)
The increased risk of cardiovascular disease associated with this diagnosis, and the need to consistently work on lifestyle to change this is discussed. Following  a  heart healthy diet ,commitment to 30 minutes of exercise at least 5 days per week, as well as control of blood sugar and cholesterol , and achieving a healthy weight are all the areas to be addressed .  

## 2014-12-07 ENCOUNTER — Other Ambulatory Visit (HOSPITAL_COMMUNITY): Payer: Commercial Managed Care - HMO

## 2014-12-07 ENCOUNTER — Other Ambulatory Visit: Payer: Self-pay | Admitting: Family Medicine

## 2014-12-14 ENCOUNTER — Other Ambulatory Visit (HOSPITAL_COMMUNITY): Payer: Commercial Managed Care - HMO

## 2014-12-30 ENCOUNTER — Encounter: Payer: Self-pay | Admitting: Orthopedic Surgery

## 2015-01-19 ENCOUNTER — Telehealth: Payer: Self-pay | Admitting: *Deleted

## 2015-01-19 DIAGNOSIS — I10 Essential (primary) hypertension: Secondary | ICD-10-CM | POA: Diagnosis not present

## 2015-01-19 DIAGNOSIS — R7309 Other abnormal glucose: Secondary | ICD-10-CM | POA: Diagnosis not present

## 2015-01-19 DIAGNOSIS — E785 Hyperlipidemia, unspecified: Secondary | ICD-10-CM | POA: Diagnosis not present

## 2015-01-19 NOTE — Telephone Encounter (Signed)
Patient's lab faxed per request

## 2015-01-19 NOTE — Telephone Encounter (Signed)
Can you fax pt's lab order to [facility]

## 2015-01-20 LAB — BASIC METABOLIC PANEL WITH GFR
BUN: 9 mg/dL (ref 7–25)
CHLORIDE: 100 mmol/L (ref 98–110)
CO2: 33 mmol/L — AB (ref 20–31)
Calcium: 8.4 mg/dL — ABNORMAL LOW (ref 8.6–10.4)
Creat: 0.63 mg/dL (ref 0.50–0.99)
GFR, Est African American: >89 mL/min (ref >=60)
GFR, Est Non African American: >89 mL/min (ref >=60)
Glucose, Bld: 87 mg/dL (ref 65–99)
Potassium: 3.7 mmol/L (ref 3.5–5.3)
SODIUM: 141 mmol/L (ref 135–146)

## 2015-01-20 LAB — TSH: TSH: 1.38 u[IU]/mL (ref 0.350–4.500)

## 2015-01-20 LAB — HEMOGLOBIN A1C
HEMOGLOBIN A1C: 6.1 % — AB (ref <5.7)
MEAN PLASMA GLUCOSE: 128 mg/dL — AB (ref <117)

## 2015-01-20 LAB — LIPID PANEL
Cholesterol: 177 mg/dL (ref 125–200)
HDL: 55 mg/dL (ref >=46)
LDL Cholesterol: 109 mg/dL (ref <130)
TRIGLYCERIDES: 65 mg/dL (ref <150)
Total CHOL/HDL Ratio: 3.2 Ratio (ref <=5.0)
VLDL: 13 mg/dL (ref <30)

## 2015-01-24 ENCOUNTER — Ambulatory Visit (INDEPENDENT_AMBULATORY_CARE_PROVIDER_SITE_OTHER): Payer: Commercial Managed Care - HMO | Admitting: Orthopedic Surgery

## 2015-01-24 ENCOUNTER — Ambulatory Visit: Payer: Commercial Managed Care - HMO

## 2015-01-24 ENCOUNTER — Encounter: Payer: Self-pay | Admitting: Orthopedic Surgery

## 2015-01-24 ENCOUNTER — Ambulatory Visit (INDEPENDENT_AMBULATORY_CARE_PROVIDER_SITE_OTHER): Payer: Commercial Managed Care - HMO

## 2015-01-24 VITALS — BP 144/70 | Ht 62.0 in | Wt 247.0 lb

## 2015-01-24 DIAGNOSIS — M1710 Unilateral primary osteoarthritis, unspecified knee: Secondary | ICD-10-CM

## 2015-01-24 DIAGNOSIS — M25562 Pain in left knee: Secondary | ICD-10-CM

## 2015-01-24 DIAGNOSIS — M129 Arthropathy, unspecified: Secondary | ICD-10-CM | POA: Diagnosis not present

## 2015-01-24 DIAGNOSIS — Z96651 Presence of right artificial knee joint: Secondary | ICD-10-CM

## 2015-01-24 NOTE — Patient Instructions (Signed)
Call when you are ready to schedule left knee replacement

## 2015-01-24 NOTE — Progress Notes (Signed)
Patient ID: Patricia Fuentes, female   DOB: 02-02-1946, 69 y.o.   MRN: [id number]   Chief Complaint  Patient presents with  . Knee Pain    Bilateral knee pain. She had right TKA on 07-11-09.    Patricia Fuentes is a 69 y.o. female.   HPI 69 yo female s/p right tka c/o of severe pain left knee 3 months. She did take some Aleve it's worse when she walks a lot she is having difficulties with certain activities of daily living including climbing stepping getting up. Chest dull ache around the knee describes her pain is 4 out of 10 she has some stiffness and loss of flexion. Review of systems joint pain cough otherwise normal Review of Systems See hpi  Past Medical History  Diagnosis Date  . Chronic back pain   . Osteoarthritis   . Obesity, unspecified   . Essential hypertension   . Hyperlipidemia     Allergies  Allergen Reactions  . Promethazine Hcl Nausea And Vomiting and Other (See Comments)    dizzy    Current Outpatient Prescriptions  Medication Sig Dispense Refill  . amLODipine (NORVASC) 2.5 MG tablet One tablet daily at 9pm each nighht for blood pressure 30 tablet 5  . atorvastatin (LIPITOR) 10 MG tablet Take 10 mg by mouth daily.    Marland Kitchen triamterene-hydrochlorothiazide (MAXZIDE-25) 37.5-25 MG per tablet TAKE ONE TABLET BY MOUTH ONCE DAILY. 30 tablet 3  . aspirin EC 81 MG tablet Take 1 tablet (81 mg total) by mouth daily. 90 tablet 3  . Aspirin-Salicylamide-Caffeine (BC HEADACHE POWDER PO) Take 1 packet by mouth once as needed (pain).     No current facility-administered medications for this visit.     Physical Exam Blood pressure 144/70, height  (1.575 m), weight 247 lb (112.038 kg). Physical Exam  The patient is well developed well nourished and well groomed.   Orientation to person place and time is normal   Mood is pleasant.  She is ambulatory no assistive devices doesn't appear to have a limp    She has tenderness over the medial joint line of the left  knee the knee does come to full extension    The stable    Motor exam is normal  Scans intact neurovascular exam is normal    Right knee no tenderness swelling instability or swelling    Incision healed    Data Reviewed We ordered AP and lateral x-rays the right total knee appears to be functioning well with no signs of loosening  The left knee is in severe varus with severe arthritis and if clinical symptoms indicate would need knee replacement   Diagnosis Encounter Diagnoses  Name Primary?  Marland Kitchen Arthritis of knee   . Left knee pain   . Status post total right knee replacement Yes    Management Recommend left total knee

## 2015-02-08 ENCOUNTER — Other Ambulatory Visit: Payer: Self-pay | Admitting: Family Medicine

## 2015-02-16 ENCOUNTER — Ambulatory Visit: Payer: Commercial Managed Care - HMO | Admitting: Orthopedic Surgery

## 2015-02-16 ENCOUNTER — Encounter: Payer: Self-pay | Admitting: Orthopedic Surgery

## 2015-04-26 ENCOUNTER — Encounter: Payer: Self-pay | Admitting: Family Medicine

## 2015-04-26 ENCOUNTER — Ambulatory Visit: Payer: Commercial Managed Care - HMO | Admitting: Family Medicine

## 2015-05-10 ENCOUNTER — Other Ambulatory Visit: Payer: Self-pay

## 2015-05-10 MED ORDER — TRIAMTERENE-HCTZ 37.5-25 MG PO TABS: 1.0000 | ORAL_TABLET | Freq: Every day | ORAL | Status: DC

## 2015-06-05 ENCOUNTER — Encounter (HOSPITAL_COMMUNITY): Payer: Self-pay | Admitting: Emergency Medicine

## 2015-06-05 ENCOUNTER — Emergency Department (HOSPITAL_COMMUNITY)
Admission: EM | Admit: 2015-06-05 | Discharge: 2015-06-05 | Disposition: A | Discharge status: Home / Self Care | Payer: Commercial Managed Care - HMO | Attending: Emergency Medicine | Admitting: Emergency Medicine

## 2015-06-05 ENCOUNTER — Emergency Department (HOSPITAL_COMMUNITY): Payer: Commercial Managed Care - HMO

## 2015-06-05 DIAGNOSIS — G8929 Other chronic pain: Secondary | ICD-10-CM | POA: Insufficient documentation

## 2015-06-05 DIAGNOSIS — R1110 Vomiting, unspecified: Secondary | ICD-10-CM | POA: Diagnosis not present

## 2015-06-05 DIAGNOSIS — E785 Hyperlipidemia, unspecified: Secondary | ICD-10-CM | POA: Insufficient documentation

## 2015-06-05 DIAGNOSIS — I10 Essential (primary) hypertension: Secondary | ICD-10-CM | POA: Insufficient documentation

## 2015-06-05 DIAGNOSIS — Z79899 Other long term (current) drug therapy: Secondary | ICD-10-CM | POA: Insufficient documentation

## 2015-06-05 DIAGNOSIS — Z7982 Long term (current) use of aspirin: Secondary | ICD-10-CM | POA: Insufficient documentation

## 2015-06-05 DIAGNOSIS — R05 Cough: Secondary | ICD-10-CM | POA: Diagnosis not present

## 2015-06-05 DIAGNOSIS — Z9889 Other specified postprocedural states: Secondary | ICD-10-CM | POA: Insufficient documentation

## 2015-06-05 DIAGNOSIS — J40 Bronchitis, not specified as acute or chronic: Secondary | ICD-10-CM

## 2015-06-05 DIAGNOSIS — J209 Acute bronchitis, unspecified: Secondary | ICD-10-CM | POA: Diagnosis not present

## 2015-06-05 DIAGNOSIS — M1990 Unspecified osteoarthritis, unspecified site: Secondary | ICD-10-CM | POA: Diagnosis not present

## 2015-06-05 DIAGNOSIS — E669 Obesity, unspecified: Secondary | ICD-10-CM | POA: Diagnosis not present

## 2015-06-05 MED ORDER — PREDNISONE 10 MG PO TABS: 20.0000 mg | ORAL_TABLET | Freq: Two times a day (BID) | ORAL | Status: DC

## 2015-06-05 MED ORDER — ALBUTEROL SULFATE HFA 108 (90 BASE) MCG/ACT IN AERS
2.0000 | INHALATION_SPRAY | RESPIRATORY_TRACT | Status: DC | PRN
  Administered 2015-06-05: 2 via RESPIRATORY_TRACT
  Filled 2015-06-05: qty 6.7

## 2015-06-05 MED ORDER — AZITHROMYCIN 250 MG PO TABS: 250.0000 mg | ORAL_TABLET | Freq: Every day | ORAL | Status: DC

## 2015-06-05 NOTE — ED Notes (Signed)
Pt reports coughing, wheezing and throwing up since Friday. Chills.  Pt airway intact, no breathing difficulties.

## 2015-06-05 NOTE — Discharge Instructions (Signed)
You may continue to take the over the counter medications with the medication we give you. Follow up with your doctor or return here for worsening symptoms.  How to Use an Inhaler Proper inhaler technique is very important. Good technique ensures that the medicine reaches the lungs. Poor technique results in depositing the medicine on the tongue and back of the throat rather than in the airways. If you do not use the inhaler with good technique, the medicine will not help you. STEPS TO FOLLOW IF USING AN INHALER WITHOUT AN EXTENSION TUBE 1. Remove the cap from the inhaler. 2. If you are using the inhaler for the first time, you will need to prime it. Shake the inhaler for 5 seconds and release four puffs into the air, away from your face. Ask your health care provider or pharmacist if you have questions about priming your inhaler. 3. Shake the inhaler for 5 seconds before each breath in (inhalation). 4. Position the inhaler so that the top of the canister faces up. 5. Put your index finger on the top of the medicine canister. Your thumb supports the bottom of the inhaler. 6. Open your mouth. 7. Either place the inhaler between your teeth and place your lips tightly around the mouthpiece, or hold the inhaler 1-2 inches away from your open mouth. If you are unsure of which technique to use, ask your health care provider. 8. Breathe out (exhale) normally and as completely as possible. 9. Press the canister down with your index finger to release the medicine. 10. At the same time as the canister is pressed, inhale deeply and slowly until your lungs are completely filled. This should take 4-6 seconds. Keep your tongue down. 11. Hold the medicine in your lungs for 5-10 seconds (10 seconds is best). This helps the medicine get into the small airways of your lungs. 12. Breathe out slowly, through pursed lips. Whistling is an example of pursed lips. 13. Wait at least 15-30 seconds between puffs. Continue  with the above steps until you have taken the number of puffs your health care provider has ordered. Do not use the inhaler more than your health care provider tells you. 14. Replace the cap on the inhaler. 15. Follow the directions from your health care provider or the inhaler insert for cleaning the inhaler. STEPS TO FOLLOW IF USING AN INHALER WITH AN EXTENSION (SPACER) 1. Remove the cap from the inhaler. 2. If you are using the inhaler for the first time, you will need to prime it. Shake the inhaler for 5 seconds and release four puffs into the air, away from your face. Ask your health care provider or pharmacist if you have questions about priming your inhaler. 3. Shake the inhaler for 5 seconds before each breath in (inhalation). 4. Place the open end of the spacer onto the mouthpiece of the inhaler. 5. Position the inhaler so that the top of the canister faces up and the spacer mouthpiece faces you. 6. Put your index finger on the top of the medicine canister. Your thumb supports the bottom of the inhaler and the spacer. 7. Breathe out (exhale) normally and as completely as possible. 8. Immediately after exhaling, place the spacer between your teeth and into your mouth. Close your lips tightly around the spacer. 9. Press the canister down with your index finger to release the medicine. 10. At the same time as the canister is pressed, inhale deeply and slowly until your lungs are completely filled. This should take  4-6 seconds. Keep your tongue down and out of the way. 11. Hold the medicine in your lungs for 5-10 seconds (10 seconds is best). This helps the medicine get into the small airways of your lungs. Exhale. 12. Repeat inhaling deeply through the spacer mouthpiece. Again hold that breath for up to 10 seconds (10 seconds is best). Exhale slowly. If it is difficult to take this second deep breath through the spacer, breathe normally several times through the spacer. Remove the spacer from  your mouth. 13. Wait at least 15-30 seconds between puffs. Continue with the above steps until you have taken the number of puffs your health care provider has ordered. Do not use the inhaler more than your health care provider tells you. 14. Remove the spacer from the inhaler, and place the cap on the inhaler. 15. Follow the directions from your health care provider or the inhaler insert for cleaning the inhaler and spacer. If you are using different kinds of inhalers, use your quick relief medicine to open the airways 10-15 minutes before using a steroid if instructed to do so by your health care provider. If you are unsure which inhalers to use and the order of using them, ask your health care provider, nurse, or respiratory therapist. If you are using a steroid inhaler, always rinse your mouth with water after your last puff, then gargle and spit out the water. Do not swallow the water. AVOID:  Inhaling before or after starting the spray of medicine. It takes practice to coordinate your breathing with triggering the spray.  Inhaling through the nose (rather than the mouth) when triggering the spray. HOW TO DETERMINE IF YOUR INHALER IS FULL OR NEARLY EMPTY You cannot know when an inhaler is empty by shaking it. A few inhalers are now being made with dose counters. Ask your health care provider for a prescription that has a dose counter if you feel you need that extra help. If your inhaler does not have a counter, ask your health care provider to help you determine the date you need to refill your inhaler. Write the refill date on a calendar or your inhaler canister. Refill your inhaler 7-10 days before it runs out. Be sure to keep an adequate supply of medicine. This includes making sure it is not expired, and that you have a spare inhaler.  SEEK MEDICAL CARE IF:   Your symptoms are only partially relieved with your inhaler.  You are having trouble using your inhaler.  You have some increase in  phlegm. SEEK IMMEDIATE MEDICAL CARE IF:   You feel little or no relief with your inhalers. You are still wheezing and are feeling shortness of breath or tightness in your chest or both.  You have dizziness, headaches, or a fast heart rate.  You have chills, fever, or night sweats.  You have a noticeable increase in phlegm production, or there is blood in the phlegm. MAKE SURE YOU:   Understand these instructions.  Will watch your condition.  Will get help right away if you are not doing well or get worse.   This information is not intended to replace advice given to you by your health care provider. Make sure you discuss any questions you have with your health care provider.   Document Released: 05/10/2000 Document Revised: 03/03/2013 Document Reviewed: 12/10/2012 Elsevier Interactive Patient Education [employer]

## 2015-06-05 NOTE — ED Provider Notes (Signed)
CSN: [id number]     Arrival date & time 06/05/15  1537 History  By signing my name below, I, [facility], Inc[facility]arrissa Washington, attest that this documentation has been prepared under the direction and in the presence of Janne NapoleonHope M Neese, NP. Electronically Signed: Randell PatientMarrissa Washington, ED Scribe. 06/05/2015. 12:33 AM.    Chief Complaint  Patient presents with  . Cough   Patient is a 70 y.o. female presenting with cough. The history is provided by the patient. No language interpreter was used.  Cough Cough characteristics:  Productive Sputum characteristics:  Yellow Severity:  Mild Onset quality:  Gradual Duration:  4 days Timing:  Intermittent Progression:  Unchanged Chronicity:  New Relieved by:  Decongestant and cough suppressants Associated symptoms: chills, sore throat and wheezing   Associated symptoms: no ear pain, no fever and no shortness of breath     HPI Comments: Patricia Fuentes is a 70 y.o. female with an hx of HTN who presents to the Emergency Department complaining of intermittent, mild, unchanging productive cough with yellow sputum onset 4 days ago. Patient endorses associated wheezing, vomiting with cough, chills, and sore throat. She has taken Coricidin and Mucinex since yesterday with slight relief. She notes similar symptoms in the past with a diagnosis of bronchitis that resolved with use of an inhaler. Per patient, she denies hx of COPD, asthma, and chronic bronchitis, DM, CAD, and arrhythmia. Patient denies fever, SOB, and ear pain.  Past Medical History  Diagnosis Date  . Chronic back pain   . Osteoarthritis   . Obesity, unspecified   . Essential hypertension   . Hyperlipidemia    Past Surgical History  Procedure Laterality Date  . Knee arthroscopy Left   . Total knee arthroplasty Right 2011  . Total abdominal hysterectomy  1995 approx    Fibroids  . Cardiac catheterization N/A 10/25/2014    Procedure: Left Heart Cath and Coronary Angiography;  Surgeon: Kathleene Hazelhristopher D  McAlhany, MD;  [facility];  Service: Cardiovascular;  Laterality: N/A;   Family History  Problem Relation Age of Onset  . Hypertension Mother   . Stroke Mother 4059    Deceased due CVa at 7159  . Diabetes Father   . Cancer Father 2172    Unknown  . Heart disease Paternal Aunt   . Hypertension Brother   . Hypertension Brother   . Hypertension Brother    Social History  Substance Use Topics  . Smoking status: Never Smoker   . Smokeless tobacco: None  . Alcohol Use: No   OB History    No data available     Review of Systems  Constitutional: Positive for chills. Negative for fever.  HENT: Positive for sore throat. Negative for ear pain.   Respiratory: Positive for cough and wheezing. Negative for shortness of breath.   Gastrointestinal: Positive for vomiting (with cough).  All other systems reviewed and are negative.     Allergies  Promethazine hcl  Home Medications   Prior to Admission medications   Medication Sig Start Date End Date Taking? Authorizing Provider  amLODipine (NORVASC) 2.5 MG tablet TAKE ONE TABLET BY MOUTH DAILY AT 9:00 PM FOR HIGH BLOOD PRESSURE. 02/08/15   Kerri PerchesMargaret E Simpson, MD  aspirin EC 81 MG tablet Take 1 tablet (81 mg total) by mouth daily. 10/19/14   Jonelle SidleSamuel G McDowell, MD  Aspirin-Salicylamide-Caffeine (BC HEADACHE POWDER PO) Take 1 packet by mouth once as needed (pain).    Historical Provider, MD  atorvastatin (LIPITOR) 10  MG tablet Take 10 mg by mouth daily.    Historical Provider, MD  azithromycin (ZITHROMAX) 250 MG tablet Take 1 tablet (250 mg total) by mouth daily. Take first 2 tablets together, then 1 every day until finished. 06/05/15   Hope Orlene Och, NP  predniSONE (DELTASONE) 10 MG tablet Take 2 tablets (20 mg total) by mouth 2 (two) times daily with a meal. 06/05/15   Hope Orlene Och, NP  triamterene-hydrochlorothiazide (MAXZIDE-25) 37.5-25 MG tablet Take 1 tablet by mouth daily. 05/10/15   Kerri Perches, MD   BP 160/76 mmHg   Pulse 79  Temp(Src) 98.8 F (37.1 C) (Oral)  Resp 18  Ht 5\' 3"  (1.6 m)  Wt 108.863 kg  BMI 42.52 kg/m2  SpO2 93% Physical Exam  Constitutional: She is oriented to person, place, and time. She appears well-developed and well-nourished. No distress.  HENT:  Head: Normocephalic and atraumatic.  Right Ear: Tympanic membrane normal.  Left Ear: Tympanic membrane normal.  Nose: Right sinus exhibits maxillary sinus tenderness. Left sinus exhibits maxillary sinus tenderness.  Mouth/Throat: Uvula is midline and mucous membranes are normal. Posterior oropharyngeal erythema present.  Eyes: Conjunctivae and EOM are normal.  Neck: Normal range of motion. Neck supple. No tracheal deviation present.  Cardiovascular: Normal rate and regular rhythm.   Pulmonary/Chest: Effort normal. No respiratory distress. Wheezes: occasional. Rhonchi: occasional.  Abdominal: Soft. Bowel sounds are normal. There is no tenderness.  Musculoskeletal: Normal range of motion.  Neurological: She is alert and oriented to person, place, and time.  Skin: Skin is warm and dry.  Psychiatric: She has a normal mood and affect. Her behavior is normal.  Nursing note and vitals reviewed.   ED Course  Procedures  DIAGNOSTIC STUDIES: Oxygen Saturation is 95% on RA, adequate by my interpretation.    COORDINATION OF CARE: 4:39 PM Will order albuterol inhaler. Will return to discuss results of chest x-ray and re-evaluate after using an albuterol inhaler. Discussed treatment plan with pt at bedside and pt agreed to plan.   Labs Review Labs Reviewed - No data to display  Imaging Review Dg Chest 2 View  06/05/2015  CLINICAL DATA:  Cough for 1 week, nonproductive. EXAM: CHEST  2 VIEW COMPARISON:  10/19/2014 FINDINGS: Thoracic spondylosis. Mild enlargement of the cardiopericardial silhouette, without edema. The lungs appear clear. No pleural effusion. Suspected calcified left hilar lymph nodes from old granulomatous disease.  IMPRESSION: 1. Mild enlargement of the cardiopericardial silhouette, without edema. 2. Thoracic spondylosis. Electronically Signed   By: Gaylyn Rong M.D.   On: 06/05/2015 17:24   I have personally reviewed and evaluated these images as part of my medical decision-making.   MDM  70 y.o. female with cough and congestion x 4 days stable for d/c without respiratory distress will treat with Z-pak and prednisone and patient will continue to take her cough medication. She will follow up with her PCP or return here for worsening symptoms.   Final diagnoses:  Bronchitis   I discussed this case with Dr. Estell Harpin  I personally performed the services described in this documentation, which was scribed in my presence. The recorded information has been reviewed and is accurate.   [address] [place], [place] 06/06/15 1478  Bethann Berkshire, MD 06/07/15 [phone]

## 2015-07-04 ENCOUNTER — Encounter: Payer: Self-pay | Admitting: Family Medicine

## 2015-07-04 ENCOUNTER — Ambulatory Visit (INDEPENDENT_AMBULATORY_CARE_PROVIDER_SITE_OTHER): Payer: Commercial Managed Care - HMO | Admitting: Family Medicine

## 2015-07-04 VITALS — BP 114/64 | HR 83 | Resp 16 | Ht 63.0 in | Wt 249.0 lb

## 2015-07-04 DIAGNOSIS — E785 Hyperlipidemia, unspecified: Secondary | ICD-10-CM

## 2015-07-04 DIAGNOSIS — Z1159 Encounter for screening for other viral diseases: Secondary | ICD-10-CM

## 2015-07-04 DIAGNOSIS — R5382 Chronic fatigue, unspecified: Secondary | ICD-10-CM

## 2015-07-04 DIAGNOSIS — R7309 Other abnormal glucose: Secondary | ICD-10-CM | POA: Diagnosis not present

## 2015-07-04 DIAGNOSIS — H547 Unspecified visual loss: Secondary | ICD-10-CM

## 2015-07-04 DIAGNOSIS — R7303 Prediabetes: Secondary | ICD-10-CM | POA: Diagnosis not present

## 2015-07-04 DIAGNOSIS — I10 Essential (primary) hypertension: Secondary | ICD-10-CM

## 2015-07-04 DIAGNOSIS — Z0000 Encounter for general adult medical examination without abnormal findings: Secondary | ICD-10-CM

## 2015-07-04 DIAGNOSIS — R9439 Abnormal result of other cardiovascular function study: Secondary | ICD-10-CM

## 2015-07-04 DIAGNOSIS — E559 Vitamin D deficiency, unspecified: Secondary | ICD-10-CM | POA: Diagnosis not present

## 2015-07-04 DIAGNOSIS — Z23 Encounter for immunization: Secondary | ICD-10-CM

## 2015-07-04 LAB — GLUCOSE, POCT (MANUAL RESULT ENTRY): POC GLUCOSE: 152 mg/dL — AB (ref 70–99)

## 2015-07-04 NOTE — Patient Instructions (Signed)
Annual physical end August , call if you need me sooner  Flu vaccine today  Labs today  Pls start changing eating and commit to 30 minutes of exercise daily, also go to the [facility] and use your silver sneakers benfit!  Chair exercises provided  Thanks for choosing [facility], we consider it a privelige to serve you.  You are referred to Dr Nile Riggs for eye exam, due to poor vision

## 2015-07-04 NOTE — Progress Notes (Signed)
Subjective:    Patient ID: Patricia Fuentes, female    DOB: [birth year], 70 y.o.   MRN: [id number]  HPI Preventive Screening-Counseling & Management   Patient present here today for a Medicare annual wellness visit. 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 No  Over the past two weeks, have you felt little interest or pleasure in doing things? No  Have you lost interest or pleasure in daily life? No  Do you often feel hopeless? No  Do you cry easily over simple problems? No   Activities of Daily Living  In your present state of health, do you have any difficulty performing the following activities?  Driving?: No Managing money?: No Feeding yourself?:No Getting from bed to chair?:No Climbing a flight of stairs?: not good, avoids them Preparing food and eating?:No Bathing or showering?:No Getting dressed?:No Getting to the toilet?:No Using the toilet?:No Moving around from place to place?: uses cane when she has to do a lot of walking   Fall Risk Assessment In the past year have you fallen or had a near fall?:No Are you currently taking any medications that make you dizzy?:No   Hearing Difficulties: No Do you often ask people to speak up or repeat  themselves?:No Do you experience ringing or noises in your ears?:No Do you have difficulty understanding soft or whispered voices?:No  Cognitive Testing  Alert? Yes Normal Appearance?Yes  Oriented to person? Yes Place? Yes  Time? Yes  Displays appropriate judgment?Yes  Can read the correct time from a watch face? yes Are you having problems remembering things?No  Advanced Directives have been discussed with the patient?Yes,full code, no living will. Brochure given    List the Names of Other Physician/Practitioners you currently use:  Dr Romeo Apple (ortho)   Indicate any recent Medical Services you may have received from other than [facility] providers in the past year (date may be approximate).   Assessment:    Annual Wellness Exam   Plan:     Medicare Attestation  I have personally reviewed:  The patient's medical and social history  Their use of alcohol, tobacco or illicit drugs  Their current medications and supplements  The patient's functional ability including ADLs,fall risks, home safety risks, cognitive, and hearing and visual impairment  Diet and physical activities  Evidence for depression or mood disorders  The patient's weight, height, BMI, and visual acuity have been recorded in the chart. I have made referrals, counseling, and provided education to the patient based on review of the above and I have provided the patient with a written personalized care plan for preventive services.     Review of Systems     Objective:   Physical Exam  BP 114/64 mmHg  Pulse 83  Resp 16  Ht  (1.6 m)  Wt 249 lb (112.946 kg)  BMI 44.12 kg/m2  SpO2 95%      Assessment & Plan:  Medicare annual wellness visit, subsequent Annual exam as documented. Counseling done  re healthy lifestyle involving commitment to 150 minutes exercise per week, heart healthy diet, and attaining healthy weight.The importance of adequate sleep also discussed. Regular seat belt use and home  safety, is also discussed. Changes in health habits are decided on by the patient with goals and time frames  set for achieving them. Immunization and cancer screening needs are specifically addressed at this visit.   Need for prophylactic vaccination and inoculation against influenza After obtaining informed consent, the vaccine is  administered by LPN.   Reduced vision Refer to opthalmology for further eval  Prediabetes Patient educated about the importance of limiting  Carbohydrate intake , the need to commit to daily physical activity for a minimum of 30 minutes , and to commit weight loss. The fact that changes in all these areas will reduce or eliminate all together the development of diabetes is stressed.   Diabetic Labs Latest Ref Rng 07/04/2015 01/19/2015 10/25/2014 10/19/2014 08/03/2014  HbA1c <5.7 % 6.1(H) 6.1(H) - - -  Chol 125 - 200 mg/dL 161 096 - - -  HDL >=04 mg/dL 51 55 - - -  Calc LDL <540 mg/dL 981 191 - - -  Triglycerides <150 mg/dL 84 65 - - -  Creatinine 0.50 - 0.99 mg/dL 4.78 2.95 6.21 3.08 6.57(Q)   BP/Weight 07/04/2015 06/05/2015 01/24/2015 11/21/2014 10/25/2014 10/19/2014 09/21/2014  Systolic BP 114 160 144 136 127 118 126  Diastolic BP 64 76 70 74 61 68 68  Wt. (Lbs) 249 240 247 247.12 238 248 245  BMI 44.12 42.52 45.17 45.19 43.52 45.35 43.41   No flowsheet data found. Unchanged    Fatigue Need to follow up, was referred for sleep eval needs this, no record that she followed through will confirm and refer again if she decides to go

## 2015-07-05 ENCOUNTER — Other Ambulatory Visit: Payer: Self-pay

## 2015-07-05 ENCOUNTER — Telehealth: Payer: Self-pay | Admitting: Family Medicine

## 2015-07-05 DIAGNOSIS — G479 Sleep disorder, unspecified: Secondary | ICD-10-CM

## 2015-07-05 LAB — HEMOGLOBIN A1C
HEMOGLOBIN A1C: 6.1 % — AB (ref <5.7)
Mean Plasma Glucose: 128 mg/dL — ABNORMAL HIGH (ref <117)

## 2015-07-05 LAB — COMPLETE METABOLIC PANEL WITH GFR
ALBUMIN: 3.7 g/dL (ref 3.6–5.1)
ALK PHOS: 60 U/L (ref 33–130)
ALT: 8 U/L (ref 6–29)
AST: 13 U/L (ref 10–35)
BUN: 16 mg/dL (ref 7–25)
CHLORIDE: 99 mmol/L (ref 98–110)
CO2: 30 mmol/L (ref 20–31)
Calcium: 8.7 mg/dL (ref 8.6–10.4)
Creat: 0.92 mg/dL (ref 0.50–0.99)
GFR, EST NON AFRICAN AMERICAN: 65 mL/min (ref >=60)
GFR, Est African American: 75 mL/min (ref >=60)
GLUCOSE: 85 mg/dL (ref 65–99)
POTASSIUM: 3.9 mmol/L (ref 3.5–5.3)
SODIUM: 139 mmol/L (ref 135–146)
Total Bilirubin: 0.4 mg/dL (ref 0.2–1.2)
Total Protein: 7.9 g/dL (ref 6.1–8.1)

## 2015-07-05 LAB — CBC WITH DIFFERENTIAL/PLATELET
BASOS PCT: 0 % (ref 0–1)
Basophils Absolute: 0 10*3/uL (ref 0.0–0.1)
Eosinophils Absolute: 0.3 10*3/uL (ref 0.0–0.7)
Eosinophils Relative: 5 % (ref 0–5)
HCT: 38.2 % (ref 36.0–46.0)
HEMOGLOBIN: 12.2 g/dL (ref 12.0–15.0)
Lymphocytes Relative: 31 % (ref 12–46)
Lymphs Abs: 1.9 10*3/uL (ref 0.7–4.0)
MCH: 27.3 pg (ref 26.0–34.0)
MCHC: 31.9 g/dL (ref 30.0–36.0)
MCV: 85.5 fL (ref 78.0–100.0)
MONO ABS: 0.7 10*3/uL (ref 0.1–1.0)
MPV: 12 fL (ref 8.6–12.4)
Monocytes Relative: 11 % (ref 3–12)
NEUTROS ABS: 3.2 10*3/uL (ref 1.7–7.7)
Neutrophils Relative %: 53 % (ref 43–77)
PLATELETS: 222 10*3/uL (ref 150–400)
RBC: 4.47 MIL/uL (ref 3.87–5.11)
RDW: 14.4 % (ref 11.5–15.5)
WBC: 6 10*3/uL (ref 4.0–10.5)

## 2015-07-05 LAB — HEPATITIS C ANTIBODY: HCV AB: NEGATIVE

## 2015-07-05 LAB — LIPID PANEL
CHOL/HDL RATIO: 3.4 ratio (ref <=5.0)
Cholesterol: 172 mg/dL (ref 125–200)
HDL: 51 mg/dL (ref >=46)
LDL Cholesterol: 104 mg/dL (ref <130)
Triglycerides: 84 mg/dL (ref <150)
VLDL: 17 mg/dL (ref <30)

## 2015-07-05 LAB — VITAMIN D 25 HYDROXY (VIT D DEFICIENCY, FRACTURES): VIT D 25 HYDROXY: 9 ng/mL — AB (ref 30–100)

## 2015-07-05 MED ORDER — VITAMIN D (ERGOCALCIFEROL) 1.25 MG (50000 UNIT) PO CAPS: 50000.0000 [IU] | ORAL_CAPSULE | ORAL | Status: DC

## 2015-07-05 NOTE — Assessment & Plan Note (Signed)
Patient educated about the importance of limiting  Carbohydrate intake , the need to commit to daily physical activity for a minimum of 30 minutes , and to commit weight loss. The fact that changes in all these areas will reduce or eliminate all together the development of diabetes is stressed.   Diabetic Labs Latest Ref Rng 07/04/2015 01/19/2015 10/25/2014 10/19/2014 08/03/2014  HbA1c <5.7 % 6.1(H) 6.1(H) - - -  Chol 125 - 200 mg/dL 161 096 - - -  HDL >=04 mg/dL 51 55 - - -  Calc LDL <540 mg/dL 981 191 - - -  Triglycerides <150 mg/dL 84 65 - - -  Creatinine 0.50 - 0.99 mg/dL 4.78 2.95 6.21 3.08 6.57(Q)   BP/Weight 07/04/2015 06/05/2015 01/24/2015 11/21/2014 10/25/2014 10/19/2014 09/21/2014  Systolic BP 114 160 144 136 127 118 126  Diastolic BP 64 76 70 74 61 68 68  Wt. (Lbs) 249 240 247 247.12 238 248 245  BMI 44.12 42.52 45.17 45.19 43.52 45.35 43.41   No flowsheet data found. Unchanged

## 2015-07-05 NOTE — Addendum Note (Signed)
Addended by: Kandis Fantasia B on: 07/05/2015 11:36 AM   Modules accepted: Orders

## 2015-07-05 NOTE — Assessment & Plan Note (Signed)
Need to follow up, was referred for sleep eval needs this, no record that she followed through will confirm and refer again if she decides to go

## 2015-07-05 NOTE — Assessment & Plan Note (Signed)

## 2015-07-05 NOTE — Assessment & Plan Note (Signed)
Refer to opthalmology for further eval

## 2015-07-05 NOTE — Assessment & Plan Note (Signed)
After obtaining informed consent, the vaccine is  administered by LPN.  

## 2015-07-05 NOTE — Telephone Encounter (Signed)
Patient agrees and referral entered.

## 2015-07-05 NOTE — Telephone Encounter (Signed)
Pls also discuss with pt the fact that because  she is not diabetic and has chronic fatigue I recommend strongly that  she follow through with being evaluated foe sleep apnea, she was referred last year, no evidence that she had this done, ps refer again to Dr Gerilyn Pilgrim if she agrees

## 2015-07-24 DIAGNOSIS — R2681 Unsteadiness on feet: Secondary | ICD-10-CM | POA: Diagnosis not present

## 2015-07-24 DIAGNOSIS — M545 Low back pain: Secondary | ICD-10-CM | POA: Diagnosis not present

## 2015-07-24 DIAGNOSIS — I10 Essential (primary) hypertension: Secondary | ICD-10-CM | POA: Diagnosis not present

## 2015-07-24 DIAGNOSIS — R5383 Other fatigue: Secondary | ICD-10-CM | POA: Diagnosis not present

## 2015-07-24 DIAGNOSIS — E559 Vitamin D deficiency, unspecified: Secondary | ICD-10-CM | POA: Diagnosis not present

## 2015-07-24 DIAGNOSIS — G479 Sleep disorder, unspecified: Secondary | ICD-10-CM | POA: Diagnosis not present

## 2015-08-09 ENCOUNTER — Other Ambulatory Visit (HOSPITAL_COMMUNITY): Payer: Self-pay | Admitting: Respiratory Therapy

## 2015-08-09 DIAGNOSIS — R5383 Other fatigue: Secondary | ICD-10-CM

## 2015-08-09 DIAGNOSIS — G4733 Obstructive sleep apnea (adult) (pediatric): Secondary | ICD-10-CM

## 2015-08-09 DIAGNOSIS — G479 Sleep disorder, unspecified: Secondary | ICD-10-CM

## 2015-08-09 DIAGNOSIS — I10 Essential (primary) hypertension: Secondary | ICD-10-CM

## 2015-08-22 DIAGNOSIS — H2513 Age-related nuclear cataract, bilateral: Secondary | ICD-10-CM | POA: Diagnosis not present

## 2015-08-22 DIAGNOSIS — H25013 Cortical age-related cataract, bilateral: Secondary | ICD-10-CM | POA: Diagnosis not present

## 2015-08-22 DIAGNOSIS — H524 Presbyopia: Secondary | ICD-10-CM | POA: Diagnosis not present

## 2015-08-22 DIAGNOSIS — H25011 Cortical age-related cataract, right eye: Secondary | ICD-10-CM | POA: Diagnosis not present

## 2015-08-22 DIAGNOSIS — H2511 Age-related nuclear cataract, right eye: Secondary | ICD-10-CM | POA: Diagnosis not present

## 2015-09-14 ENCOUNTER — Other Ambulatory Visit (HOSPITAL_COMMUNITY): Payer: Self-pay | Admitting: Respiratory Therapy

## 2015-09-14 DIAGNOSIS — I10 Essential (primary) hypertension: Secondary | ICD-10-CM

## 2015-09-14 DIAGNOSIS — G4733 Obstructive sleep apnea (adult) (pediatric): Secondary | ICD-10-CM

## 2015-09-14 DIAGNOSIS — R5383 Other fatigue: Secondary | ICD-10-CM

## 2015-09-14 DIAGNOSIS — G479 Sleep disorder, unspecified: Secondary | ICD-10-CM

## 2015-10-11 ENCOUNTER — Other Ambulatory Visit: Payer: Self-pay | Admitting: Family Medicine

## 2015-10-24 ENCOUNTER — Telehealth: Payer: Self-pay | Admitting: Family Medicine

## 2015-10-24 NOTE — Telephone Encounter (Signed)
[facility] is a center that provides non surgical relief for joint pain. Does she need a referral for this or just call and make an herself an appt?

## 2015-10-24 NOTE — Telephone Encounter (Signed)
I do not know if her insurance will cobver, I am assuming this is not "orthiopedic office per say Advise her to call speak with them and ask for appt and get binfo from them if she needs referral from PCP, that office can send request for referral here

## 2015-10-24 NOTE — Telephone Encounter (Signed)
Patricia Fuentes is calling asking for a [place] [facility] [place], please advise?

## 2015-10-30 NOTE — Telephone Encounter (Signed)
Patient stated that she would follow up with Dr. Romeo AppleHarrison

## 2015-11-14 DIAGNOSIS — M25562 Pain in left knee: Secondary | ICD-10-CM | POA: Diagnosis not present

## 2015-11-14 DIAGNOSIS — R262 Difficulty in walking, not elsewhere classified: Secondary | ICD-10-CM | POA: Diagnosis not present

## 2015-11-14 DIAGNOSIS — M1712 Unilateral primary osteoarthritis, left knee: Secondary | ICD-10-CM | POA: Diagnosis not present

## 2015-11-15 ENCOUNTER — Ambulatory Visit: Payer: Self-pay | Admitting: Orthopedic Surgery

## 2015-11-30 DIAGNOSIS — M25562 Pain in left knee: Secondary | ICD-10-CM | POA: Diagnosis not present

## 2015-11-30 DIAGNOSIS — M1712 Unilateral primary osteoarthritis, left knee: Secondary | ICD-10-CM | POA: Diagnosis not present

## 2015-12-07 DIAGNOSIS — M25562 Pain in left knee: Secondary | ICD-10-CM | POA: Diagnosis not present

## 2015-12-07 DIAGNOSIS — M1712 Unilateral primary osteoarthritis, left knee: Secondary | ICD-10-CM | POA: Diagnosis not present

## 2015-12-14 DIAGNOSIS — M25562 Pain in left knee: Secondary | ICD-10-CM | POA: Diagnosis not present

## 2015-12-14 DIAGNOSIS — M1712 Unilateral primary osteoarthritis, left knee: Secondary | ICD-10-CM | POA: Diagnosis not present

## 2015-12-21 DIAGNOSIS — M1712 Unilateral primary osteoarthritis, left knee: Secondary | ICD-10-CM | POA: Diagnosis not present

## 2015-12-21 DIAGNOSIS — M25562 Pain in left knee: Secondary | ICD-10-CM | POA: Diagnosis not present

## 2015-12-28 DIAGNOSIS — M1712 Unilateral primary osteoarthritis, left knee: Secondary | ICD-10-CM | POA: Diagnosis not present

## 2015-12-28 DIAGNOSIS — M25562 Pain in left knee: Secondary | ICD-10-CM | POA: Diagnosis not present

## 2016-01-02 ENCOUNTER — Ambulatory Visit (INDEPENDENT_AMBULATORY_CARE_PROVIDER_SITE_OTHER): Payer: Commercial Managed Care - HMO | Admitting: Family Medicine

## 2016-01-02 ENCOUNTER — Encounter: Payer: Self-pay | Admitting: Family Medicine

## 2016-01-02 VITALS — BP 140/82 | HR 79 | Resp 16 | Ht 63.0 in | Wt 252.0 lb

## 2016-01-02 DIAGNOSIS — Z1211 Encounter for screening for malignant neoplasm of colon: Secondary | ICD-10-CM

## 2016-01-02 DIAGNOSIS — R102 Pelvic and perineal pain: Secondary | ICD-10-CM | POA: Diagnosis not present

## 2016-01-02 DIAGNOSIS — R7303 Prediabetes: Secondary | ICD-10-CM | POA: Diagnosis not present

## 2016-01-02 DIAGNOSIS — Z0000 Encounter for general adult medical examination without abnormal findings: Secondary | ICD-10-CM | POA: Diagnosis not present

## 2016-01-02 DIAGNOSIS — R350 Frequency of micturition: Secondary | ICD-10-CM

## 2016-01-02 DIAGNOSIS — R1030 Lower abdominal pain, unspecified: Secondary | ICD-10-CM

## 2016-01-02 DIAGNOSIS — I10 Essential (primary) hypertension: Secondary | ICD-10-CM | POA: Diagnosis not present

## 2016-01-02 DIAGNOSIS — E559 Vitamin D deficiency, unspecified: Secondary | ICD-10-CM | POA: Diagnosis not present

## 2016-01-02 DIAGNOSIS — R109 Unspecified abdominal pain: Secondary | ICD-10-CM | POA: Insufficient documentation

## 2016-01-02 DIAGNOSIS — Z1231 Encounter for screening mammogram for malignant neoplasm of breast: Secondary | ICD-10-CM

## 2016-01-02 LAB — POC HEMOCCULT BLD/STL (OFFICE/1-CARD/DIAGNOSTIC): Fecal Occult Blood, POC: NEGATIVE

## 2016-01-02 LAB — BASIC METABOLIC PANEL
BUN: 14 mg/dL (ref 7–25)
CALCIUM: 8.6 mg/dL (ref 8.6–10.4)
CO2: 28 mmol/L (ref 20–31)
CREATININE: 0.77 mg/dL (ref 0.50–0.99)
Chloride: 98 mmol/L (ref 98–110)
GLUCOSE: 102 mg/dL — AB (ref 65–99)
Potassium: 3.7 mmol/L (ref 3.5–5.3)
SODIUM: 141 mmol/L (ref 135–146)

## 2016-01-02 LAB — POCT URINALYSIS DIPSTICK
Bilirubin, UA: NEGATIVE
Glucose, UA: NEGATIVE
KETONES UA: NEGATIVE
Leukocytes, UA: NEGATIVE
Nitrite, UA: NEGATIVE
RBC UA: NEGATIVE
SPEC GRAV UA: 1.025
UROBILINOGEN UA: 0.2
pH, UA: 5.5

## 2016-01-02 MED ORDER — AMLODIPINE BESYLATE 5 MG PO TABS: 5.0000 mg | ORAL_TABLET | Freq: Every day | ORAL | 5 refills | Status: DC

## 2016-01-02 NOTE — Assessment & Plan Note (Signed)

## 2016-01-02 NOTE — Assessment & Plan Note (Addendum)
Uncontrolled increase dose of amlodipine to 5 mg daily DASH diet and commitment to daily physical activity for a minimum of 30 minutes discussed and encouraged, as a part of hypertension management. The importance of attaining a healthy weight is also discussed.  BP/Weight 01/02/2016 07/04/2015 06/05/2015 01/24/2015 11/21/2014 10/25/2014 10/19/2014  Systolic BP 140 114 160 144 136 127 118  Diastolic BP 82 64 76 70 74 61 68  Wt. (Lbs) 252 249 240 247 247.12 238 248  BMI 44.64 44.12 42.52 45.17 45.19 43.52 45.35

## 2016-01-02 NOTE — Patient Instructions (Addendum)
F/u in 2 month, call if you need me sooner  HBA1C, chem 7 and Vit D today  Urine to be tested for infection today  You are referred for colonoscopy with Dr Bethanne GingerFields  You are referred for mammogram Amlodipine increased to 5mg  daily   You are referred for abdominal abnd pelvic cT scan  Please work on good  health habits so that your health will improve. 1. Commitment to daily physical activity for 30 to 60  minutes, if you are able to do this.  2. Commitment to wise food choices. Aim for half of your  food intake to be vegetable and fruit, one quarter starchy foods, and one quarter protein. Try to eat on a regular schedule  3 meals per day, snacking between meals should be limited to vegetables or fruits or small portions of nuts. 64 ounces of water per day is generally recommended, unless you have specific health conditions, like heart failure or kidney failure where you will need to limit fluid intake.  3. Commitment to sufficient and a  good quality of physical and mental rest daily, generally between 6 to 8 hours per day.  WITH PERSISTANCE AND PERSEVERANCE, THE IMPOSSIBLE , BECOMES THE NORM!

## 2016-01-03 LAB — HEMOGLOBIN A1C
HEMOGLOBIN A1C: 5.9 % — AB (ref <5.7)
Mean Plasma Glucose: 123 mg/dL

## 2016-01-03 LAB — VITAMIN D 25 HYDROXY (VIT D DEFICIENCY, FRACTURES): Vit D, 25-Hydroxy: 18 ng/mL — ABNORMAL LOW (ref 30–100)

## 2016-01-05 ENCOUNTER — Other Ambulatory Visit: Payer: Self-pay

## 2016-01-05 MED ORDER — VITAMIN D (ERGOCALCIFEROL) 1.25 MG (50000 UNIT) PO CAPS: 50000.0000 [IU] | ORAL_CAPSULE | ORAL | 5 refills | Status: DC

## 2016-01-05 NOTE — Progress Notes (Signed)
Vitamin d 50000  

## 2016-01-06 ENCOUNTER — Encounter: Payer: Self-pay | Admitting: Family Medicine

## 2016-01-06 DIAGNOSIS — R350 Frequency of micturition: Secondary | ICD-10-CM | POA: Insufficient documentation

## 2016-01-06 NOTE — Progress Notes (Signed)
    Patricia EmperorSylvia A Fuentes     MRN: [id number]      DOB: 09/23/1945  HPI: Patient is in for annual physical exam. C/o abdominal and pelvic pain, noted in past month C/o increased left knee pain , despite injections, wants to hold off on ortho eval at this time. Recent labs, if available are reviewed. Immunization is reviewed , and  updated if needed.   PE: Pleasant  female, alert and oriented x 3, in no cardio-pulmonary distress. Afebrile. HEENT No facial trauma or asymetry. Sinuses non tender.  Extra occullar muscles intact, pupils equally reactive to light. External ears normal, tympanic membranes clear. Oropharynx moist, no exudate.No teeth Neck: supple, no adenopathy,JVD or thyromegaly.No bruits.  Chest: Clear to ascultation bilaterally.No crackles or wheezes. Non tender to palpation  Breast: No asymetry,no masses or lumps. No tenderness. No nipple discharge or inversion. No axillary or supraclavicular adenopathy  Cardiovascular system; Heart sounds normal,  S1 and  S2 ,no S3.  No murmur, or thrill. Apical beat not displaced Peripheral pulses normal.  Abdomen: Soft, bilateral lower quadrant tenderness, right greater than left, possible pelvic mass No bruits. Bowel sounds normal. No guarding, tenderness or rebound.  Rectal:  Normal sphincter tone. No rectal mass. Guaiac negative stool.  GU: External genitalia normal female genitalia , normal female distribution of hair. No lesions. Urethral meatus normal in size, no  Prolapse, no lesions visibly  Present. Bladder non tender. Vagina pink and moist , with no visible lesions , discharge present . Adequate pelvic support no  cystocele or rectocele noted  Uterus absent, possible right adnexal   mass,   Right  adnexal tenderness.   Musculoskeletal exam: Decreased ROM of spine,  And left  knees.  deformity ,swelling and  crepitus noted.in left knee No muscle wasting or atrophy.   Neurologic: Cranial nerves 2 to 12  intact. Power, tone ,sensation and reflexes normal throughout. No disturbance in gait. No tremor.  Skin: Intact, no ulceration, erythema , scaling or rash noted. Pigmentation normal throughout  Psych; Normal mood and affect. Judgement and concentration normal   Assessment & Plan:  Annual physical exam Annual exam as documented. Counseling done  re healthy lifestyle involving commitment to 150 minutes exercise per week, heart healthy diet, and attaining healthy weight.The importance of adequate sleep also discussed. Regular seat belt use and home safety, is also discussed. Changes in health habits are decided on by the patient with goals and time frames  set for achieving them. Immunization and cancer screening needs are specifically addressed at this visit.     Essential hypertension Uncontrolled increase dose of amlodipine to 5 mg daily DASH diet and commitment to daily physical activity for a minimum of 30 minutes discussed and encouraged, as a part of hypertension management. The importance of attaining a healthy weight is also discussed.  BP/Weight 01/02/2016 07/04/2015 06/05/2015 01/24/2015 11/21/2014 10/25/2014 10/19/2014  Systolic BP 140 114 160 144 136 127 118  Diastolic BP 82 64 76 70 74 61 68  Wt. (Lbs) 252 249 240 247 247.12 238 248  BMI 44.64 44.12 42.52 45.17 45.19 43.52 45.35       Abdominal pain Intermittent lower abdominal pain and tenderness, with possible LLQ mass on exam refer fpor scan, also now agrees to get colonoscopy, has never had one!  Pelvic pain in female Right adnexal pain and tenderness with abnormal pelvic exam , imaging study ordered  Urinary frequency Urinalysis at visit negative for infection

## 2016-01-06 NOTE — Assessment & Plan Note (Signed)
Right adnexal pain and tenderness with abnormal pelvic exam , imaging study ordered

## 2016-01-06 NOTE — Assessment & Plan Note (Signed)
Intermittent lower abdominal pain and tenderness, with possible LLQ mass on exam refer fpor scan, also now agrees to get colonoscopy, has never had one!

## 2016-01-06 NOTE — Assessment & Plan Note (Signed)
Urinalysis at visit negative for infection

## 2016-01-08 ENCOUNTER — Ambulatory Visit (HOSPITAL_COMMUNITY)
Admission: RE | Admit: 2016-01-08 | Discharge: 2016-01-08 | Disposition: A | Discharge status: Home / Self Care | Payer: Commercial Managed Care - HMO | Source: Ambulatory Visit | Attending: Family Medicine | Admitting: Family Medicine

## 2016-01-08 DIAGNOSIS — K8020 Calculus of gallbladder without cholecystitis without obstruction: Secondary | ICD-10-CM | POA: Insufficient documentation

## 2016-01-08 DIAGNOSIS — R911 Solitary pulmonary nodule: Secondary | ICD-10-CM | POA: Diagnosis not present

## 2016-01-08 DIAGNOSIS — R1030 Lower abdominal pain, unspecified: Secondary | ICD-10-CM | POA: Diagnosis not present

## 2016-01-08 DIAGNOSIS — R102 Pelvic and perineal pain: Secondary | ICD-10-CM | POA: Insufficient documentation

## 2016-01-08 DIAGNOSIS — R1900 Intra-abdominal and pelvic swelling, mass and lump, unspecified site: Secondary | ICD-10-CM | POA: Diagnosis not present

## 2016-01-08 DIAGNOSIS — I700 Atherosclerosis of aorta: Secondary | ICD-10-CM | POA: Diagnosis not present

## 2016-01-08 DIAGNOSIS — K5730 Diverticulosis of large intestine without perforation or abscess without bleeding: Secondary | ICD-10-CM | POA: Insufficient documentation

## 2016-01-08 MED ORDER — IOPAMIDOL (ISOVUE-300) INJECTION 61%
100.0000 mL | Freq: Once | INTRAVENOUS | Status: AC | PRN
  Administered 2016-01-08: 100 mL via INTRAVENOUS

## 2016-01-09 ENCOUNTER — Other Ambulatory Visit: Payer: Self-pay | Admitting: Family Medicine

## 2016-01-09 ENCOUNTER — Telehealth: Payer: Self-pay | Admitting: Gastroenterology

## 2016-01-09 DIAGNOSIS — R935 Abnormal findings on diagnostic imaging of other abdominal regions, including retroperitoneum: Secondary | ICD-10-CM

## 2016-01-09 DIAGNOSIS — E785 Hyperlipidemia, unspecified: Secondary | ICD-10-CM

## 2016-01-09 DIAGNOSIS — I10 Essential (primary) hypertension: Secondary | ICD-10-CM

## 2016-01-09 MED ORDER — CIPROFLOXACIN HCL 500 MG PO TABS: 500.0000 mg | ORAL_TABLET | Freq: Two times a day (BID) | ORAL | 0 refills | Status: DC

## 2016-01-09 MED ORDER — METRONIDAZOLE 500 MG PO TABS
500.0000 mg | ORAL_TABLET | Freq: Three times a day (TID) | ORAL | 0 refills | Status: DC
Start: 2016-01-09 — End: 2016-01-18

## 2016-01-09 NOTE — Telephone Encounter (Signed)
Forwarding to [facility] to get appt for next week.

## 2016-01-09 NOTE — Telephone Encounter (Signed)
Received referral request from Dr. Lodema HongSimpson for urgent office visit related to abnormal abdominal pelvic CT on patient.  I have reviewed CT findings. CT suggestive of mild acute diverticulitis in the mid sigmoid colon. Also with small mildly enhancing 1.8 cm left adnexal mass appeared to be adherent to mildly inflamed mid sigmoid colon. Small pericolonic abscess cannot be excluded.  Would consider management for diverticulitis with course of Cipro and Flagyl for 10-14 days. I will request Dr. Lodema HongSimpson to consider writing this prescription as we have not established care with the patient.  Please get patient into the office to be seen next week as she will need to have colonoscopy at a later date, typically 3-4 weeks after completing antibiotics.    Doris, can you please touch base with Dr. Anthony SarSimpson's office and make sure that they're aware of our recommendations. I will also send telephone encounter to Dr. Lodema HongSimpson directly and Dr. Darrick PennaFields.

## 2016-01-09 NOTE — Telephone Encounter (Signed)
I called and spoke to [facility], she is aware and has been unable to reach pt. She will let Dr. Lodema HongSimpson know that Verlon AuLeslie has sent her a message.

## 2016-01-10 NOTE — Telephone Encounter (Signed)
REVIEWED-NO ADDITIONAL RECOMMENDATIONS. 

## 2016-01-10 NOTE — Telephone Encounter (Signed)
Noted. Patient has an appointment.

## 2016-01-10 NOTE — Telephone Encounter (Signed)
I PERSONALLY REVIEWED THE CT WITH DR. Tyron RussellBOLES. HE REVIEWED WITH DR. Eppie GibsonSTAHL. THEY FAVOR DIVERTICULOSIS WITH AN OVARY. NO DEFINITE EVIDENCE FOR DIVERTICULITIS. TCS WITHIN 1-2 WEEKS. NO NEED FOR CIPRO/FLAGYL.

## 2016-01-10 NOTE — Telephone Encounter (Signed)
Called patient TO DISCUSS RESULTS. LEFT VOICE MAIL. NO NEED FOR ABX. NEEDS TCS. CALL [phone] WITH QUESTIONS.

## 2016-01-17 ENCOUNTER — Ambulatory Visit: Payer: Self-pay | Admitting: Gastroenterology

## 2016-01-18 ENCOUNTER — Ambulatory Visit (INDEPENDENT_AMBULATORY_CARE_PROVIDER_SITE_OTHER): Payer: Commercial Managed Care - HMO | Admitting: Gastroenterology

## 2016-01-18 ENCOUNTER — Other Ambulatory Visit: Payer: Self-pay

## 2016-01-18 ENCOUNTER — Encounter: Payer: Self-pay | Admitting: Gastroenterology

## 2016-01-18 DIAGNOSIS — R1031 Right lower quadrant pain: Principal | ICD-10-CM

## 2016-01-18 DIAGNOSIS — R933 Abnormal findings on diagnostic imaging of other parts of digestive tract: Secondary | ICD-10-CM

## 2016-01-18 DIAGNOSIS — G8929 Other chronic pain: Secondary | ICD-10-CM

## 2016-01-18 MED ORDER — NA SULFATE-K SULFATE-MG SULF 17.5-3.13-1.6 GM/177ML PO SOLN: 1.0000 | ORAL | 0 refills | Status: DC

## 2016-01-18 NOTE — Progress Notes (Signed)
cc'ed to pcp °

## 2016-01-18 NOTE — Progress Notes (Addendum)
REVIEWED-NO ADDITIONAL RECOMMENDATIONS.  Primary Care Physician:  Syliva OvermanMargaret Simpson, MD  Primary Gastroenterologist:  Jonette EvaSandi Fields, MD   Chief Complaint  Patient presents with  . Other    referred for abnormal abd CT scan    HPI:  Patricia Fuentes is a 70 y.o. female here to schedule colonoscopy. She is a new patient, referred by Dr. Lodema HongSimpson for abnormal CT scan. Initial read, suggestion of mild acute diverticulitis in the mid sigmoid colon. Small mildly enhancing 1.8 cm left adnexal mass, appears adherent to the mildly inflamed mid sigmoid colon. Small pericolonic abscess is not entirely excluded.  Dr. Darrick Pennafields reviewed the CT scan with Dr. Tyron RussellBoles, radiologist. It was also reviewed with Dr. Eppie GibsonStahl. They favor diverticulosis with no evidence of diverticulitis, left adnexal mass probably left ovary. Dr. Darrick Pennafields has recommended colonoscopy.  Patient reports that she was being seen for routine physical and then on exam she was noted to have some right lower quadrant pain. She had never noticed it before. This resulted in CT scan as described above. Bowel function has been regular. No blood in the stool or melena. Denies heartburn, vomiting, dysphagia, fever. She has had a hysterectomy but she is not sure if they removed her ovaries. No prior colonoscopy.   Current Outpatient Prescriptions  Medication Sig Dispense Refill  . amLODipine (NORVASC) 5 MG tablet Take 1 tablet (5 mg total) by mouth daily. 30 tablet 5  . aspirin EC 81 MG tablet Take 1 tablet (81 mg total) by mouth daily. 90 tablet 3  . Aspirin-Salicylamide-Caffeine (BC HEADACHE POWDER PO) Take 1 packet by mouth once as needed (pain).    . Cholecalciferol (VITAMIN D3 ADULT GUMMIES PO) Take 1 tablet by mouth daily.    Marland Kitchen. triamterene-hydrochlorothiazide (MAXZIDE-25) 37.5-25 MG tablet TAKE ONE TABLET BY MOUTH ONCE DAILY. 30 tablet 2  . Vitamin D, Ergocalciferol, (DRISDOL) 50000 units CAPS capsule Take 1 capsule (50,000 Units total) by mouth  every 7 (seven) days. (Patient not taking: Reported on 01/18/2016) 4 capsule 6  . Vitamin D, Ergocalciferol, (DRISDOL) 50000 units CAPS capsule Take 1 capsule (50,000 Units total) by mouth every 7 (seven) days. (Patient not taking: Reported on 01/18/2016) 4 capsule 5   No current facility-administered medications for this visit.     Allergies as of 01/18/2016 - Review Complete 01/18/2016  Allergen Reaction Noted  . Promethazine hcl Nausea And Vomiting and Other (See Comments) 10/07/2007    Past Medical History:  Diagnosis Date  . Chronic back pain   . Essential hypertension   . Hyperlipidemia   . IGT (impaired glucose tolerance) 2015  . Obesity, unspecified   . Osteoarthritis     Past Surgical History:  Procedure Laterality Date  . CARDIAC CATHETERIZATION N/A 10/25/2014   Procedure: Left Heart Cath and Coronary Angiography;  Surgeon: Kathleene Hazelhristopher D McAlhany, MD;  [facility];  Service: Cardiovascular;  Laterality: N/A;  . KNEE ARTHROSCOPY Left   . TOTAL ABDOMINAL HYSTERECTOMY  1995 approx   Fibroids  . TOTAL KNEE ARTHROPLASTY Right 2011    Family History  Problem Relation Age of Onset  . Hypertension Mother   . Stroke Mother 2459    Deceased due CVa at 4859  . Diabetes Father   . Cancer Father [id number]    Unknown, somewhere in stomach  . Hypertension Brother   . Hypertension Brother   . Hypertension Brother   . Cancer Maternal Grandfather     ?type    Social History   Social  History  . Marital status: Married    Spouse name: N/A  . Number of children: N/A  . Years of education: N/A   Occupational History  . retired Unemployed   Social History Main Topics  . Smoking status: Never Smoker  . Smokeless tobacco: Never Used  . Alcohol use No  . Drug use: No  . Sexual activity: Yes   Other Topics Concern  . Not on file   Social History Narrative  . No narrative on file      ROS:  General: Negative for anorexia, weight loss, fever, chills, fatigue,  weakness. Eyes: Negative for vision changes.  ENT: Negative for hoarseness, difficulty swallowing , nasal congestion. CV: Negative for chest pain, angina, palpitations, dyspnea on exertion, peripheral edema.  Respiratory: Negative for dyspnea at rest, dyspnea on exertion, cough, sputum, wheezing.  GI: See history of present illness. GU:  Negative for dysuria, hematuria, urinary incontinence, urinary frequency, nocturnal urination.  MS: Negative for joint pain, low back pain.  Derm: Negative for rash or itching.  Neuro: Negative for weakness, abnormal sensation, seizure, frequent headaches, memory loss, confusion.  Psych: Negative for anxiety, depression, suicidal ideation, hallucinations.  Endo: Negative for unusual weight change.  Heme: Negative for bruising or bleeding. Allergy: Negative for rash or hives.    Physical Examination:  BP (!) 145/67   Pulse 69   Temp 98.2 F (36.8 C) (Oral)   Ht 5\' 2"  (1.575 m)   Wt 251 lb 12.8 oz (114.2 kg)   BMI 46.05 kg/m    General: Well-nourished, well-developed in no acute distress.  Head: Normocephalic, atraumatic.   Eyes: Conjunctiva pink, no icterus. Mouth: Oropharyngeal mucosa moist and pink , no lesions erythema or exudate. Neck: Supple without thyromegaly, masses, or lymphadenopathy.  Lungs: Clear to auscultation bilaterally.  Heart: Regular rate and rhythm, no murmurs rubs or gallops.  Abdomen: Bowel sounds are normal, mild tenderness with palpation in the right lower quadrant and over the left lower quadrant iliac crest region.  nondistended, no hepatosplenomegaly or masses, no abdominal bruits or    hernia , no rebound or guarding.   Rectal: Not performed Extremities: No lower extremity edema. No clubbing or deformities.  Neuro: Alert and oriented x 4 , grossly normal neurologically.  Skin: Warm and dry, no rash or jaundice.   Psych: Alert and cooperative, normal mood and affect.  Labs: Lab Results  Component Value Date    CREATININE 0.77 01/02/2016   BUN 14 01/02/2016   NA 141 01/02/2016   K 3.7 01/02/2016   CL 98 01/02/2016   CO2 28 01/02/2016   Lab Results  Component Value Date   ALT 8 07/04/2015   AST 13 07/04/2015   ALKPHOS 60 07/04/2015   BILITOT 0.4 07/04/2015   Lab Results  Component Value Date   WBC 6.0 07/04/2015   HGB 12.2 07/04/2015   HCT 38.2 07/04/2015   MCV 85.5 07/04/2015   PLT 222 07/04/2015      Imaging Studies: Ct Abdomen Pelvis W Wo Contrast  Result Date: 01/08/2016 CLINICAL DATA:  Intermittent abdominopelvic pain for 2-3 weeks. Reported history of right adnexal mass. EXAM: CT ABDOMEN AND PELVIS WITHOUT AND WITH CONTRAST TECHNIQUE: Multidetector CT imaging of the abdomen and pelvis was performed following the standard protocol before and following the bolus administration of intravenous contrast. CONTRAST:  100mL ISOVUE-300 IOPAMIDOL (ISOVUE-300) INJECTION 61% COMPARISON:  None. FINDINGS: Lower chest: Right lower lobe 3 mm pulmonary nodule (series 10/image 1). Hepatobiliary: Normal liver with  no liver mass. Tiny 4 mm calcified gallstone in the nondistended gallbladder, with no gallbladder wall thickening or pericholecystic fluid. No biliary ductal dilatation. Pancreas: Normal, with no mass or duct dilation. Spleen: Normal size. No mass. Adrenals/Urinary Tract: Normal adrenals. No hydronephrosis. No renal stones. Normal bladder. Stomach/Bowel: Grossly normal stomach. Normal caliber small bowel with no small bowel wall thickening. Normal appendix. There is moderate sigmoid diverticulosis. There is mild wall thickening in the mid sigmoid colon with associated minimal pericolonic fat stranding, suggesting mild acute sigmoid diverticulitis. There is a 1.8 x 1.8 cm mildly enhancing left adnexal mass (series 6/ image 61), which is directly contiguous with the mildly inflamed mid sigmoid colon (loss of the normal intervening fat). Otherwise normal large bowel. Vascular/Lymphatic: Mildly  atherosclerotic nonaneurysmal abdominal aorta. Patent portal, splenic, hepatic and renal veins. No pathologically enlarged lymph nodes in the abdomen or pelvis. Reproductive: Status post hysterectomy, with no abnormal findings at the vaginal cuff. No right adnexal mass. Other: No pneumoperitoneum, ascites or focal fluid collection. Musculoskeletal: Nonspecific 1.8 cm sclerotic anterior left acetabular bone lesion. Marked lower thoracic spine spondylosis. Mild lumbar spondylosis. Nonspecific subcutaneous fat stranding in the far lateral left ventral abdominal wall. No superficial fluid collections. IMPRESSION: 1. Moderate sigmoid diverticulosis. CT findings are suggestive of mild acute diverticulitis in the mid sigmoid colon. No free air. 2. Small mildly enhancing 1.8 cm left adnexal mass, which appears adherent to the mildly inflamed mid sigmoid colon. This could represent a remnant normal left ovary, although a right ovary is not visualized (recommend correlation with prior gynecologic surgical details). A small pericolonic abscess cannot be entirely excluded. A short term post-treatment follow-up CT abdomen/pelvis with IV and oral contrast is recommended. 3. Right lower lobe 3 mm pulmonary nodule. No follow-up needed if patient is low-risk. Non-contrast chest CT can be considered in 12 months if patient is high-risk. This recommendation follows the consensus statement: Guidelines for Management of Incidental Pulmonary Nodules Detected on CT Images:From the Fleischner Society 2017; published online before print (10.1148/radiol.[id number]). 4. Nonspecific solitary anterior left acetabular sclerotic bone lesion. Statistically most likely to represent a benign bone island. Correlate with bone scintigraphy study as clinically warranted. 5. Cholelithiasis. 6. Aortic atherosclerosis. These results will be called to the ordering clinician or representative by the Radiologist Assistant, and communication documented in the  PACS or zVision Dashboard. Electronically Signed   By: Delbert Phenix M.D.   On: 01/08/2016 17:37

## 2016-01-18 NOTE — Patient Instructions (Signed)
1. Colonoscopy in near future with Dr. Darrick PennaFields. See separate instructions.

## 2016-01-18 NOTE — Assessment & Plan Note (Signed)
70 year old female with no prior colonoscopy who presents for further evaluation of recent abnormal CT of abdomen. As described above, initially there was concern for diverticulitis with adnexal mass adherent to the inflamed sigmoid colon. Reread by radiology favors no evidence of diverticulitis, and left adnexal mass possibly left ovary.   On exam she has some vague tenderness in the right lower quadrant and left lower quadrant sleeve over the iliac crests.  As per Dr. Darrick Pennafields previous recommendations, we will pursue colonoscopy initially.  I have discussed the risks, alternatives, benefits with regards to but not limited to the risk of reaction to medication, bleeding, infection, perforation and the patient is agreeable to proceed. Written consent to be obtained.  Further recommendations to follow. Cannot exclude the possibility of pelvic ultrasound needed to further evaluate left adnexa.

## 2016-02-06 ENCOUNTER — Other Ambulatory Visit: Payer: Self-pay | Admitting: Family Medicine

## 2016-02-07 ENCOUNTER — Telehealth: Payer: Self-pay | Admitting: Gastroenterology

## 2016-02-07 NOTE — Telephone Encounter (Signed)
Pt is scheduled for a procedure on Friday with SF. She has questions about her medications. Please call her at [phone]

## 2016-02-07 NOTE — Telephone Encounter (Signed)
Talked with her and she when over your medicine for her test.

## 2016-02-09 ENCOUNTER — Encounter (HOSPITAL_COMMUNITY): Payer: Self-pay | Admitting: *Deleted

## 2016-02-09 ENCOUNTER — Ambulatory Visit (HOSPITAL_COMMUNITY)
Admission: RE | Admit: 2016-02-09 | Discharge: 2016-02-09 | Disposition: A | Discharge status: Home / Self Care | Payer: Commercial Managed Care - HMO | Source: Ambulatory Visit | Attending: Gastroenterology | Admitting: Gastroenterology

## 2016-02-09 ENCOUNTER — Encounter (HOSPITAL_COMMUNITY)
Admission: RE | Disposition: A | Discharge status: Home / Self Care | Payer: Self-pay | Source: Ambulatory Visit | Attending: Gastroenterology

## 2016-02-09 DIAGNOSIS — K5730 Diverticulosis of large intestine without perforation or abscess without bleeding: Secondary | ICD-10-CM | POA: Insufficient documentation

## 2016-02-09 DIAGNOSIS — Z96651 Presence of right artificial knee joint: Secondary | ICD-10-CM | POA: Diagnosis not present

## 2016-02-09 DIAGNOSIS — K644 Residual hemorrhoidal skin tags: Secondary | ICD-10-CM | POA: Diagnosis not present

## 2016-02-09 DIAGNOSIS — R933 Abnormal findings on diagnostic imaging of other parts of digestive tract: Secondary | ICD-10-CM

## 2016-02-09 DIAGNOSIS — Z1211 Encounter for screening for malignant neoplasm of colon: Secondary | ICD-10-CM | POA: Diagnosis not present

## 2016-02-09 DIAGNOSIS — I10 Essential (primary) hypertension: Secondary | ICD-10-CM | POA: Diagnosis not present

## 2016-02-09 DIAGNOSIS — Z7982 Long term (current) use of aspirin: Secondary | ICD-10-CM | POA: Insufficient documentation

## 2016-02-09 DIAGNOSIS — G8929 Other chronic pain: Secondary | ICD-10-CM

## 2016-02-09 DIAGNOSIS — R1031 Right lower quadrant pain: Secondary | ICD-10-CM

## 2016-02-09 DIAGNOSIS — M1990 Unspecified osteoarthritis, unspecified site: Secondary | ICD-10-CM | POA: Diagnosis not present

## 2016-02-09 DIAGNOSIS — Z79899 Other long term (current) drug therapy: Secondary | ICD-10-CM | POA: Insufficient documentation

## 2016-02-09 HISTORY — PX: COLONOSCOPY: SHX5424

## 2016-02-09 SURGERY — COLONOSCOPY
Anesthesia: Moderate Sedation

## 2016-02-09 MED ORDER — MIDAZOLAM HCL 5 MG/5ML IJ SOLN
INTRAMUSCULAR | Status: DC | PRN
  Administered 2016-02-09: 1 mg via INTRAVENOUS
  Administered 2016-02-09 (×3): 2 mg via INTRAVENOUS
  Administered 2016-02-09: 1 mg via INTRAVENOUS

## 2016-02-09 MED ORDER — MIDAZOLAM HCL 5 MG/5ML IJ SOLN
INTRAMUSCULAR | Status: AC
  Filled 2016-02-09: qty 10

## 2016-02-09 MED ORDER — MEPERIDINE HCL 100 MG/ML IJ SOLN
INTRAMUSCULAR | Status: DC | PRN
  Administered 2016-02-09 (×3): 25 mg via INTRAVENOUS

## 2016-02-09 MED ORDER — MEPERIDINE HCL 100 MG/ML IJ SOLN
INTRAMUSCULAR | Status: AC
  Filled 2016-02-09: qty 2

## 2016-02-09 MED ORDER — SODIUM CHLORIDE 0.9 % IV SOLN
INTRAVENOUS | Status: DC
  Administered 2016-02-09: 12:00:00 via INTRAVENOUS

## 2016-02-09 NOTE — Discharge Instructions (Signed)
You have lnternal hemorrhoids.. YOU HAVE diverticulosis IN YOUR LEFT COLON. YOU DID NOT HAVE ANY POLYPS. THE LAST PART OF YOU SMALL BOWEL IS NORMAL.   CONTINUE YOUR WEIGHT LOSS EFFORTS. YOUR BODY MASS INDEX IS OVER 40 WHICH MEANS YOU ARE MORBIDLY OBESE. OBESITY IS ASSOCIATED WITH AN INCREASE FOR ALL CANCERS, INCLUDING ESOPHAGEAL AND COLON CANCER.  DRINK WATER TO KEEP YOUR URINE LIGHT YELLOW.  FOLLOW A HIGH FIBER DIET. AVOID ITEMS THAT CAUSE BLOATING. See info below.  USE PREPARATION H FOUR TIMES  A DAY IF NEEDED TO RELIEVE RECTAL PAIN/PRESSURE/BLEEDING.   YOUR BIOPSY RESULTS WILL BE AVAILABLE IN MY CHART  SEP 19 AND MY OFFICE WILL CONTACT YOU IN 10-14 DAYS WITH YOUR RESULTS.   Next colonoscopy in 10-15 years IF THE BENEFITS OUTWEIGH THE RISKS.  Colonoscopy Care After Read the instructions outlined below and refer to this sheet in the next week. These discharge instructions provide you with general information on caring for yourself after you leave the hospital. While your treatment has been planned according to the most current medical practices available, unavoidable complications occasionally occur. If you have any problems or questions after discharge, call DR. Naven Giambalvo, [phone].  ACTIVITY  You may resume your regular activity, but move at a slower pace for the next 24 hours.   Take frequent rest periods for the next 24 hours.   Walking will help get rid of the air and reduce the bloated feeling in your belly (abdomen).   No driving for 24 hours (because of the medicine (anesthesia) used during the test).   You may shower.   Do not sign any important legal documents or operate any machinery for 24 hours (because of the anesthesia used during the test).    NUTRITION  Drink plenty of fluids.   You may resume your normal diet as instructed by your doctor.   Begin with a light meal and progress to your normal diet. Heavy or fried foods are harder to digest and may make you  feel sick to your stomach (nauseated).   Avoid alcoholic beverages for 24 hours or as instructed.    MEDICATIONS  You may resume your normal medications.   WHAT YOU CAN EXPECT TODAY  Some feelings of bloating in the abdomen.   Passage of more gas than usual.   Spotting of blood in your stool or on the toilet paper  .  IF YOU HAD POLYPS REMOVED DURING THE COLONOSCOPY:  Eat a soft diet IF YOU HAVE NAUSEA, BLOATING, ABDOMINAL PAIN, OR VOMITING.    FINDING OUT THE RESULTS OF YOUR TEST Not all test results are available during your visit. DR. Darrick Penna WILL CALL YOU WITHIN 14 DAYS OF YOUR PROCEDUE WITH YOUR RESULTS. Do not assume everything is normal if you have not heard from DR. Arlie Riker, CALL HER OFFICE AT [phone].  SEEK IMMEDIATE MEDICAL ATTENTION AND CALL THE OFFICE: [phone] IF:  You have more than a spotting of blood in your stool.   Your belly is swollen (abdominal distention).   You are nauseated or vomiting.   You have a temperature over 101F.   You have abdominal pain or discomfort that is severe or gets worse throughout the day.  High-Fiber Diet A high-fiber diet changes your normal diet to include more whole grains, legumes, fruits, and vegetables. Changes in the diet involve replacing refined carbohydrates with unrefined foods. The calorie level of the diet is essentially unchanged. The Dietary Reference Intake (recommended amount) for adult males is 40  grams per day. For adult females, it is 25 grams per day. Pregnant and lactating women should consume 28 grams of fiber per day. Fiber is the intact part of a plant that is not broken down during digestion. Functional fiber is fiber that has been isolated from the plant to provide a beneficial effect in the body. PURPOSE  Increase stool bulk.   Ease and regulate bowel movements.   Lower cholesterol.  REDUCE RISK OF COLON CANCER  INDICATIONS THAT YOU NEED MORE FIBER  Constipation and hemorrhoids.    Uncomplicated diverticulosis (intestine condition) and irritable bowel syndrome.   Weight management.   As a protective measure against hardening of the arteries (atherosclerosis), diabetes, and cancer.   GUIDELINES FOR INCREASING FIBER IN THE DIET  Start adding fiber to the diet slowly. A gradual increase of about 5 more grams (2 slices of whole-wheat bread, 2 servings of most fruits or vegetables, or 1 bowl of high-fiber cereal) per day is best. Too rapid an increase in fiber may result in constipation, flatulence, and bloating.   Drink enough water and fluids to keep your urine clear or pale yellow. Water, juice, or caffeine-free drinks are recommended. Not drinking enough fluid may cause constipation.   Eat a variety of high-fiber foods rather than one type of fiber.   Try to increase your intake of fiber through using high-fiber foods rather than fiber pills or supplements that contain small amounts of fiber.   The goal is to change the types of food eaten. Do not supplement your present diet with high-fiber foods, but replace foods in your present diet.   INCLUDE A VARIETY OF FIBER SOURCES  Replace refined and processed grains with whole grains, canned fruits with fresh fruits, and incorporate other fiber sources. White rice, white breads, and most bakery goods contain little or no fiber.   Brown whole-grain rice, buckwheat oats, and many fruits and vegetables are all good sources of fiber. These include: broccoli, Brussels sprouts, cabbage, cauliflower, beets, sweet potatoes, white potatoes (skin on), carrots, tomatoes, eggplant, squash, berries, fresh fruits, and dried fruits.   Cereals appear to be the richest source of fiber. Cereal fiber is found in whole grains and bran. Bran is the fiber-rich outer coat of cereal grain, which is largely removed in refining. In whole-grain cereals, the bran remains. In breakfast cereals, the largest amount of fiber is found in those with  "bran" in their names. The fiber content is sometimes indicated on the label.   You may need to include additional fruits and vegetables each day.   In baking, for 1 cup white flour, you may use the following substitutions:   1 cup whole-wheat flour minus 2 tablespoons.   1/2 cup white flour plus 1/2 cup whole-wheat flour.   Diverticulosis Diverticulosis is a common condition that develops when small pouches (diverticula) form in the wall of the colon. The risk of diverticulosis increases with age. It happens more often in people who eat a low-fiber diet. Most individuals with diverticulosis have no symptoms. Those individuals with symptoms usually experience belly (abdominal) pain, constipation, or loose stools (diarrhea).  HOME CARE INSTRUCTIONS  Increase the amount of fiber in your diet as directed by your caregiver or dietician. This may reduce symptoms of diverticulosis.   Drink at least 6 to 8 glasses of water each day to prevent constipation.   Try not to strain when you have a bowel movement.   Avoiding nuts and seeds to prevent complications is NOT  NECESSARY.     FOODS HAVING HIGH FIBER CONTENT INCLUDE:  Fruits. Apple, peach, pear, tangerine, raisins, prunes.   Vegetables. Brussels sprouts, asparagus, broccoli, cabbage, carrot, cauliflower, romaine lettuce, spinach, summer squash, tomato, winter squash, zucchini.   Starchy Vegetables. Baked beans, kidney beans, lima beans, split peas, lentils, potatoes (with skin).   Grains. Whole wheat bread, brown rice, bran flake cereal, plain oatmeal, white rice, shredded wheat, bran muffins.    SEEK IMMEDIATE MEDICAL CARE IF:  You develop increasing pain or severe bloating.   You have an oral temperature above 101F.   You develop vomiting or bowel movements that are bloody or black.   Hemorrhoids Hemorrhoids are dilated (enlarged) veins around the rectum. Sometimes clots will form in the veins. This makes them swollen and  painful. These are called thrombosed hemorrhoids. Causes of hemorrhoids include:  Constipation.   Straining to have a bowel movement.   HEAVY LIFTING  HOME CARE INSTRUCTIONS  Eat a well balanced diet and drink 6 to 8 glasses of water every day to avoid constipation. You may also use a bulk laxative.   Avoid straining to have bowel movements.   Keep anal area dry and clean.   Do not use a donut shaped pillow or sit on the toilet for long periods. This increases blood pooling and pain.   Move your bowels when your body has the urge; this will require less straining and will decrease pain and pressure.

## 2016-02-09 NOTE — H&P (Signed)
Primary Care Physician:  Tula Nakayama, MD Primary Gastroenterologist:  Dr. Oneida Alar  Pre-Procedure History & Physical: HPI:  Patricia Fuentes is a 70 y.o. female here for [place].  Past Medical History:  Diagnosis Date  . Chronic back pain   . Essential hypertension   . Hyperlipidemia   . IGT (impaired glucose tolerance) 2015  . Obesity, unspecified   . Osteoarthritis     Past Surgical History:  Procedure Laterality Date  . CARDIAC CATHETERIZATION N/A 10/25/2014   Procedure: Left Heart Cath and Coronary Angiography;  Surgeon: Burnell Blanks, MD;  Location: [facility];  Service: Cardiovascular;  Laterality: N/A;  . KNEE ARTHROSCOPY Left   . TOTAL ABDOMINAL HYSTERECTOMY  1995 approx   Fibroids  . TOTAL KNEE ARTHROPLASTY Right 2011    Prior to Admission medications   Medication Sig Start Date End Date Taking? Authorizing Provider  amLODipine (NORVASC) 5 MG tablet Take 1 tablet (5 mg total) by mouth daily. 01/02/16  Yes Fayrene Helper, MD  aspirin EC 81 MG tablet Take 1 tablet (81 mg total) by mouth daily. 10/19/14  Yes Satira Sark, MD  Aspirin-Salicylamide-Caffeine [facility] HEADACHE POWDER PO) Take 1 packet by mouth once as needed (pain).   Yes Historical Provider, MD  Cholecalciferol (VITAMIN D3 ADULT GUMMIES PO) Take 1 tablet by mouth daily.   Yes Historical Provider, MD  Na Sulfate-K Sulfate-Mg Sulf (SUPREP BOWEL PREP KIT) 17.5-3.13-1.6 GM/180ML SOLN Take 1 kit by mouth as directed. 01/18/16  Yes Danie Binder, MD  triamterene-hydrochlorothiazide (MAXZIDE-25) 37.5-25 MG tablet TAKE ONE TABLET BY MOUTH ONCE DAILY. 02/07/16  Yes Fayrene Helper, MD    Allergies as of 01/18/2016 - Review Complete 01/18/2016  Allergen Reaction Noted  . Promethazine hcl Nausea And Vomiting and Other (See Comments) 10/07/2007    Family History  Problem Relation Age of Onset  . Hypertension Mother   . Stroke Mother 54    Deceased due CVa at 43  . Diabetes  Father   . Cancer Father 60    Unknown, somewhere in stomach  . Hypertension Brother   . Hypertension Brother   . Hypertension Brother   . Cancer Maternal Grandfather     ?type    Social History   Social History  . Marital status: Married    Spouse name: N/A  . Number of children: N/A  . Years of education: N/A   Occupational History  . retired Unemployed   Social History Main Topics  . Smoking status: Never Smoker  . Smokeless tobacco: Never Used  . Alcohol use No  . Drug use: No  . Sexual activity: Yes   Other Topics Concern  . Not on file   Social History Narrative  . No narrative on file    Review of Systems: See HPI, otherwise negative ROS   Physical Exam: BP (!) 133/59   Pulse 73   Temp 98.8 F (37.1 C) (Oral)   Resp 19   Ht '5\' 2"'  (1.575 m)   Wt 250 lb (113.4 kg)   SpO2 98%   BMI 45.73 kg/m  General:   Alert,  pleasant and cooperative in NAD Head:  Normocephalic and atraumatic. Neck:  Supple; Lungs:  Clear throughout to auscultation.    Heart:  Regular rate and rhythm. Abdomen:  Soft, nontender and nondistended. Normal bowel sounds, without guarding, and without rebound.   Neurologic:  Alert and  oriented x4;  grossly normal neurologically.  Impression/Plan:  SCREENING  Plan:  1. TCS TODAY

## 2016-02-09 NOTE — Op Note (Signed)
[facility] Patient Name: Patricia Fuentes Procedure Date: 02/09/2016 12:58 PM MRN: [id number] Date of Birth: 01/25/1946 Attending MD: Jonette Eva , MD CSN: [id number] Age: 70 Admit Type: Outpatient Procedure:                Colonoscopy, SCREENING Indications:              Screening for colorectal malignant neoplasm Providers:                Jonette Eva, MD, Jannett Celestine, RN, Birder Robson,                            Technician Referring MD:             Milus Mallick. Simpson MD, MD Medicines:                Meperidine 75 mg IV, Midazolam 8 mg IV Complications:            No immediate complications. Estimated Blood Loss:     Estimated blood loss was minimal. Procedure:                Pre-Anesthesia Assessment:                           - Prior to the procedure, a History and Physical                            was performed, and patient medications and                            allergies were reviewed. The patient's tolerance of                            previous anesthesia was also reviewed. The risks                            and benefits of the procedure and the sedation                            options and risks were discussed with the patient.                            All questions were answered, and informed consent                            was obtained. Prior Anticoagulants: The patient has                            taken aspirin, last dose was day of procedure. ASA                            Grade Assessment: II - A patient with mild systemic                            disease. After reviewing the risks and benefits,  the patient was deemed in satisfactory condition to                            undergo the procedure. After obtaining informed                            consent, the colonoscope was passed under direct                            vision. Throughout the procedure, the patient's                            blood pressure,  pulse, and oxygen saturations were                            monitored continuously. The EC-[id number] ([id number])                            scope was introduced through the anus and advanced                            to the the terminal ileum. The [id number] ([id number])                            was introduced through the and advanced to the. The                            terminal ileum, ileocecal valve, appendiceal                            orifice, and rectum were photographed. The                            colonoscopy was somewhat difficult due to                            restricted mobility of the colon and a tortuous                            colon. Successful completion of the procedure was                            aided by withdrawing the scope and replacing with                            the pediatric colonoscope. The patient tolerated                            the procedure fairly well. The quality of the bowel                            preparation was excellent. Scope In: 1:34:04 PM Scope Out: 1:57:42 PM Scope Withdrawal Time: 0 hours 10 minutes 21 seconds  Total Procedure Duration: 0 hours 23 minutes 38 seconds  Findings:      The digital rectal exam findings include non-thrombosed external       hemorrhoids.      The recto-sigmoid colon was significantly redundant. Advancing the scope       required withdrawing the scope and replacing with the pediatric       colonoscope.      Multiple small-mouthed diverticula were found in the sigmoid colon.      The exam was otherwise without abnormality. Impression:               - Non-thrombosed external hemorrhoids found on                            digital rectal exam.                           - Redundant colon.                           - MILD Diverticulosis in the sigmoid colon WITH                            HYPERTROPHY.                           - The examination was otherwise normal. Moderate Sedation:      Moderate  (conscious) sedation was administered by the endoscopy nurse       and supervised by the endoscopist. The following parameters were       monitored: oxygen saturation, heart rate, blood pressure, and response       to care. Total physician intraservice time was 33 minutes. Recommendation:           - High fiber diet and low fat diet.                           - Continue present medications.                           - Repeat colonoscopy 10-15 YEARS IF THE BENEFITS                            OUTWEIGH THE RISKS for surveillance.                           - Patient has a contact number available for                            emergencies. The signs and symptoms of potential                            delayed complications were discussed with the                            patient. Return to normal activities tomorrow.  Written discharge instructions were provided to the                            patient. Procedure Code(s):        --- Professional ---                           [phone], Colonoscopy, flexible; diagnostic, including                            collection of specimen(s) by brushing or washing,                            when performed (separate procedure)                           99152, Moderate sedation services provided by the                            same physician or other qualified health care                            professional performing the diagnostic or                            therapeutic service that the sedation supports,                            requiring the presence of an independent trained                            observer to assist in the monitoring of the                            patient's level of consciousness and physiological                            status; initial 15 minutes of intraservice time,                            patient age 18 years or older                           [id number], Moderate sedation services; each  additional                            15 minutes intraservice time Diagnosis Code(s):        --- Professional ---                           Z12.11, Encounter for screening for malignant                            neoplasm of colon  K64.4, Residual hemorrhoidal skin tags                           K57.30, Diverticulosis of large intestine without                            perforation or abscess without bleeding                           Q43.8, Other specified congenital malformations of                            intestine CPT copyright 2016 [employer] Association. All rights reserved. The codes documented in this report are preliminary and upon coder review may  be revised to meet current compliance requirements. Jonette EvaSandi Jalen Daluz, MD Jonette EvaSandi Newman Waren, MD 02/09/2016 2:20:07 PM This report has been signed electronically. Number of Addenda: 0

## 2016-02-19 ENCOUNTER — Encounter (HOSPITAL_COMMUNITY): Payer: Self-pay | Admitting: Gastroenterology

## 2016-03-04 ENCOUNTER — Ambulatory Visit: Payer: Self-pay | Admitting: Family Medicine

## 2016-03-11 ENCOUNTER — Ambulatory Visit (INDEPENDENT_AMBULATORY_CARE_PROVIDER_SITE_OTHER): Payer: Commercial Managed Care - HMO | Admitting: Orthopedic Surgery

## 2016-03-11 ENCOUNTER — Encounter: Payer: Self-pay | Admitting: Orthopedic Surgery

## 2016-03-11 ENCOUNTER — Ambulatory Visit (INDEPENDENT_AMBULATORY_CARE_PROVIDER_SITE_OTHER): Payer: Commercial Managed Care - HMO

## 2016-03-11 VITALS — BP 130/67 | HR 64 | Wt 252.0 lb

## 2016-03-11 DIAGNOSIS — M1712 Unilateral primary osteoarthritis, left knee: Secondary | ICD-10-CM

## 2016-03-11 DIAGNOSIS — G8929 Other chronic pain: Secondary | ICD-10-CM | POA: Diagnosis not present

## 2016-03-11 DIAGNOSIS — M25562 Pain in left knee: Secondary | ICD-10-CM | POA: Diagnosis not present

## 2016-03-11 MED ORDER — ACETAMINOPHEN-CODEINE #3 300-30 MG PO TABS: 1.0000 | ORAL_TABLET | Freq: Four times a day (QID) | ORAL | 0 refills | Status: DC | PRN

## 2016-03-11 NOTE — Progress Notes (Signed)
Chief Complaint  Patient presents with  . Follow-up    LEFT KNEE PAIN   [age] year old female with history of previous right total knee replacement presents for evaluation of left knee with history of left knee arthritis  She reports the history of pain in the left knee, dull throbbing, severe, constant, increased with exercise, worsening  Past Medical History:  Diagnosis Date  . Chronic back pain   . Essential hypertension   . Hyperlipidemia   . IGT (impaired glucose tolerance) 2015  . Obesity, unspecified   . Osteoarthritis    Review of Systems  Constitutional: Negative for chills and fever.  Respiratory: Negative for shortness of breath.   Cardiovascular: Negative for chest pain.    BP 130/67   Pulse 64   Wt 252 lb (114.3 kg)   BMI 46.09 kg/m   Appearance other than her obesity she is well-groomed well-nourished She is oriented 3 Mood is pleasant and affect is upbeat Gait is antalgic   Medial joint line tenderness over the left knee no effusion Flexion 100 Stability normal Strength in the quadriceps normal Skin in the left leg normal Distal pulses intact with minimal edema Sensation is normal  X-ray today shows varus knee severe arthritis bone-on-bone medial compartment secondary bone changes  Encounter Diagnosis  Name Primary?  . Chronic pain of left knee Yes    Plan is for left knee replacement at her convenience preferably with her BMI less than 40 and this was explained to her what the complications would be if it does not get to that level  We plan on seeing her first of the year

## 2016-03-18 ENCOUNTER — Ambulatory Visit: Payer: Self-pay | Admitting: Family Medicine

## 2016-03-21 ENCOUNTER — Encounter: Payer: Self-pay | Admitting: Family Medicine

## 2016-03-21 ENCOUNTER — Ambulatory Visit (INDEPENDENT_AMBULATORY_CARE_PROVIDER_SITE_OTHER): Payer: Commercial Managed Care - HMO | Admitting: Family Medicine

## 2016-03-21 VITALS — BP 138/78 | HR 89 | Ht 63.0 in | Wt 259.0 lb

## 2016-03-21 DIAGNOSIS — E785 Hyperlipidemia, unspecified: Secondary | ICD-10-CM | POA: Diagnosis not present

## 2016-03-21 DIAGNOSIS — I10 Essential (primary) hypertension: Secondary | ICD-10-CM | POA: Diagnosis not present

## 2016-03-21 DIAGNOSIS — R7303 Prediabetes: Secondary | ICD-10-CM | POA: Diagnosis not present

## 2016-03-21 DIAGNOSIS — Z1231 Encounter for screening mammogram for malignant neoplasm of breast: Secondary | ICD-10-CM

## 2016-03-21 DIAGNOSIS — Z13820 Encounter for screening for osteoporosis: Secondary | ICD-10-CM

## 2016-03-21 DIAGNOSIS — E8881 Metabolic syndrome: Secondary | ICD-10-CM

## 2016-03-21 DIAGNOSIS — E559 Vitamin D deficiency, unspecified: Secondary | ICD-10-CM

## 2016-03-21 DIAGNOSIS — Z23 Encounter for immunization: Secondary | ICD-10-CM

## 2016-03-21 NOTE — Patient Instructions (Signed)
Annual wellness Feb 13 or after, call if you need me sooner  Fasting labs Feb 7 or after  Flu vaccine today  NEED mammogram and bone density test, we will schedule both, please have them done!  Please change eating as we discussed for improved health  Please commit to daily exercise for 30 minutes, and enjoy it!  Thank you  for choosing [facility]. We consider it a privelige to serve you.  Delivering excellent health care in a caring and  compassionate way is our goal.  Partnering with you,  so that together we can achieve this goal is our strategy.

## 2016-03-21 NOTE — Assessment & Plan Note (Signed)
Hyperlipidemia:Low fat diet discussed and encouraged.   Lipid Panel  Lab Results  Component Value Date   CHOL 172 07/04/2015   HDL 51 07/04/2015   LDLCALC 104 07/04/2015   TRIG 84 07/04/2015   CHOLHDL 3.4 07/04/2015   Updated lab needed at/ before next visit.

## 2016-03-21 NOTE — Progress Notes (Signed)
Patricia Fuentes     MRN: [id number]      DOB: February 14, 1946   HPI Patricia Fuentes is here for follow up and re-evaluation of chronic medical conditions, medication management and review of any available recent lab and radiology data.  Preventive health is updated, specifically  Cancer screening and Immunization.   Questions or concerns regarding consultations or procedures which the PT has had in the interim are  Addressed.clean bill of health with GI concerns The PT denies any adverse reactions to current medications since the last visit.  Poor eating habits with ongoing weight gain and no consistent exercise, the need to change both is discussed  ROS Denies recent fever or chills. Denies sinus pressure, nasal congestion, ear pain or sore throat. Denies chest congestion, productive cough or wheezing. Denies chest pains, palpitations and leg swelling Denies abdominal pain, nausea, vomiting,diarrhea or constipation.   Denies dysuria, frequency, hesitancy or incontinence. Denies uncontrolled  joint pain, swelling and limitation in mobility. Denies headaches, seizures, numbness, or tingling. Denies depression, anxiety or insomnia. Denies skin break down or rash.   PE  BP 138/78   Pulse 89   Ht 5\' 3"  (1.6 m)   Wt 259 lb (117.5 kg)   SpO2 94%   BMI 45.88 kg/m   Patient alert and oriented and in no cardiopulmonary distress.  HEENT: No facial asymmetry, EOMI,   oropharynx pink and moist.  Neck supple no JVD, no mass.  Chest: Clear to auscultation bilaterally.  CVS: S1, S2 no murmurs, no S3.Regular rate.  ABD: Soft non tender.   Ext: No edema  MS: Adequate ROM spine, shoulders, hips and reduced in  knees.  Skin: Intact, no ulcerations or rash noted.  Psych: Good eye contact, normal affect. Memory intact not anxious or depressed appearing.  CNS: CN 2-12 intact, power,  normal throughout.no focal deficits noted.   Assessment & Plan  Essential hypertension Controlled, no  change in medication DASH diet and commitment to daily physical activity for a minimum of 30 minutes discussed and encouraged, as a part of hypertension management. The importance of attaining a healthy weight is also discussed.  BP/Weight 03/21/2016 03/11/2016 02/09/2016 01/18/2016 01/02/2016 07/04/2015 06/05/2015  Systolic BP 138 130 94 145 140 114 160  Diastolic BP 78 67 81 67 82 64 76  Wt. (Lbs) 259 252 250 251.8 252 249 240  BMI 45.88 46.09 45.73 46.05 44.64 44.12 42.52       Hyperlipidemia LDL goal <100 Hyperlipidemia:Low fat diet discussed and encouraged.   Lipid Panel  Lab Results  Component Value Date   CHOL 172 07/04/2015   HDL 51 07/04/2015   LDLCALC 104 07/04/2015   TRIG 84 07/04/2015   CHOLHDL 3.4 07/04/2015   Updated lab needed at/ before next visit.     Prediabetes Patient educated about the importance of limiting  Carbohydrate intake , the need to commit to daily physical activity for a minimum of 30 minutes , and to commit weight loss. The fact that changes in all these areas will reduce or eliminate all together the development of diabetes is stressed.   Diabetic Labs Latest Ref Rng & Units 01/02/2016 07/04/2015 01/19/2015 10/25/2014 10/19/2014  HbA1c <5.7 % 5.9(H) 6.1(H) 6.1(H) - -  Chol 125 - 200 mg/dL - 578 469 - -  HDL >=62 mg/dL - 51 55 - -  Calc LDL <952 mg/dL - 841 324 - -  Triglycerides <150 mg/dL - 84 65 - -  Creatinine 0.50 - 0.99  mg/dL 1.610.77 0.960.92 0.450.63 4.090.85 8.110.80   BP/Weight 03/21/2016 03/11/2016 02/09/2016 01/18/2016 01/02/2016 07/04/2015 06/05/2015  Systolic BP 138 130 94 145 140 114 160  Diastolic BP 78 67 81 67 82 64 76  Wt. (Lbs) 259 252 250 251.8 252 249 240  BMI 45.88 46.09 45.73 46.05 44.64 44.12 42.52   No flowsheet data found.    Morbid obesity Deteriorated. Patient re-educated about  the importance of commitment to a  minimum of 150 minutes of exercise per week.  The importance of healthy food choices with portion control discussed. Encouraged  to start a food diary, count calories and to consider  joining a support group. Sample diet sheets offered. Goals set by the patient for the next several months.   Weight /BMI 03/21/2016 03/11/2016 02/09/2016  WEIGHT 259 lb 252 lb 250 lb  HEIGHT 5\' 3"  - 5\' 2"   BMI 45.88 kg/m2 46.09 kg/m2 45.73 kg/m2      Metabolic syndrome X The increased risk of cardiovascular disease associated with this diagnosis, and the need to consistently work on lifestyle to change this is discussed. Following  a  heart healthy diet ,commitment to 30 minutes of exercise at least 5 days per week, as well as control of blood sugar and cholesterol , and achieving a healthy weight are all the areas to be addressed .   Need for prophylactic vaccination and inoculation against influenza After obtaining informed consent, the vaccine is  administered by LPN.

## 2016-03-21 NOTE — Assessment & Plan Note (Signed)
Controlled, no change in medication DASH diet and commitment to daily physical activity for a minimum of 30 minutes discussed and encouraged, as a part of hypertension management. The importance of attaining a healthy weight is also discussed.  BP/Weight 03/21/2016 03/11/2016 02/09/2016 01/18/2016 01/02/2016 07/04/2015 06/05/2015  Systolic BP 138 130 94 145 140 114 160  Diastolic BP 78 67 81 67 82 64 76  Wt. (Lbs) 259 252 250 251.8 252 249 240  BMI 45.88 46.09 45.73 46.05 44.64 44.12 42.52

## 2016-03-23 ENCOUNTER — Encounter: Payer: Self-pay | Admitting: Family Medicine

## 2016-03-23 NOTE — Assessment & Plan Note (Signed)
Deteriorated. Patient re-educated about  the importance of commitment to a  minimum of 150 minutes of exercise per week.  The importance of healthy food choices with portion control discussed. Encouraged to start a food diary, count calories and to consider  joining a support group. Sample diet sheets offered. Goals set by the patient for the next several months.   Weight /BMI 03/21/2016 03/11/2016 02/09/2016  WEIGHT 259 lb 252 lb 250 lb  HEIGHT 5\' 3"  - 5\' 2"   BMI 45.88 kg/m2 46.09 kg/m2 45.73 kg/m2

## 2016-03-23 NOTE — Assessment & Plan Note (Signed)
The increased risk of cardiovascular disease associated with this diagnosis, and the need to consistently work on lifestyle to change this is discussed. Following  a  heart healthy diet ,commitment to 30 minutes of exercise at least 5 days per week, as well as control of blood sugar and cholesterol , and achieving a healthy weight are all the areas to be addressed .  

## 2016-03-23 NOTE — Assessment & Plan Note (Signed)
Patient educated about the importance of limiting  Carbohydrate intake , the need to commit to daily physical activity for a minimum of 30 minutes , and to commit weight loss. The fact that changes in all these areas will reduce or eliminate all together the development of diabetes is stressed.   Diabetic Labs Latest Ref Rng & Units 01/02/2016 07/04/2015 01/19/2015 10/25/2014 10/19/2014  HbA1c <5.7 % 5.9(H) 6.1(H) 6.1(H) - -  Chol 125 - 200 mg/dL - 409172 811177 - -  HDL >=91>=46 mg/dL - 51 55 - -  Calc LDL <478<130 mg/dL - 295104 621109 - -  Triglycerides <150 mg/dL - 84 65 - -  Creatinine 0.50 - 0.99 mg/dL 3.080.77 6.570.92 8.460.63 9.620.85 9.520.80   BP/Weight 03/21/2016 03/11/2016 02/09/2016 01/18/2016 01/02/2016 07/04/2015 06/05/2015  Systolic BP 138 130 94 145 140 114 160  Diastolic BP 78 67 81 67 82 64 76  Wt. (Lbs) 259 252 250 251.8 252 249 240  BMI 45.88 46.09 45.73 46.05 44.64 44.12 42.52   No flowsheet data found.

## 2016-03-23 NOTE — Assessment & Plan Note (Signed)
After obtaining informed consent, the vaccine is  administered by LPN.  

## 2016-04-06 ENCOUNTER — Emergency Department (HOSPITAL_COMMUNITY)
Admission: EM | Admit: 2016-04-06 | Discharge: 2016-04-06 | Disposition: A | Discharge status: Home / Self Care | Payer: Commercial Managed Care - HMO | Attending: Emergency Medicine | Admitting: Emergency Medicine

## 2016-04-06 ENCOUNTER — Encounter (HOSPITAL_COMMUNITY): Payer: Self-pay | Admitting: Emergency Medicine

## 2016-04-06 ENCOUNTER — Emergency Department (HOSPITAL_COMMUNITY): Payer: Commercial Managed Care - HMO

## 2016-04-06 DIAGNOSIS — Z7982 Long term (current) use of aspirin: Secondary | ICD-10-CM | POA: Diagnosis not present

## 2016-04-06 DIAGNOSIS — I10 Essential (primary) hypertension: Secondary | ICD-10-CM | POA: Diagnosis not present

## 2016-04-06 DIAGNOSIS — Z79899 Other long term (current) drug therapy: Secondary | ICD-10-CM | POA: Diagnosis not present

## 2016-04-06 DIAGNOSIS — R05 Cough: Secondary | ICD-10-CM | POA: Diagnosis not present

## 2016-04-06 DIAGNOSIS — J40 Bronchitis, not specified as acute or chronic: Secondary | ICD-10-CM

## 2016-04-06 MED ORDER — ALBUTEROL SULFATE HFA 108 (90 BASE) MCG/ACT IN AERS
2.0000 | INHALATION_SPRAY | Freq: Once | RESPIRATORY_TRACT | Status: AC
  Administered 2016-04-06: 2 via RESPIRATORY_TRACT
  Filled 2016-04-06: qty 6.7

## 2016-04-06 MED ORDER — HYDROCODONE-HOMATROPINE 5-1.5 MG/5ML PO SYRP: 5.0000 mL | ORAL_SOLUTION | Freq: Four times a day (QID) | ORAL | 0 refills | Status: DC | PRN

## 2016-04-06 MED ORDER — BENZONATATE 100 MG PO CAPS
100.0000 mg | ORAL_CAPSULE | Freq: Once | ORAL | Status: DC
  Filled 2016-04-06: qty 1

## 2016-04-06 MED ORDER — PREDNISONE 20 MG PO TABS
40.0000 mg | ORAL_TABLET | Freq: Once | ORAL | Status: AC
  Administered 2016-04-06: 40 mg via ORAL
  Filled 2016-04-06: qty 2

## 2016-04-06 MED ORDER — AZITHROMYCIN 250 MG PO TABS
500.0000 mg | ORAL_TABLET | Freq: Once | ORAL | Status: AC
  Administered 2016-04-06: 500 mg via ORAL
  Filled 2016-04-06: qty 2

## 2016-04-06 MED ORDER — AZITHROMYCIN 250 MG PO TABS: 250.0000 mg | ORAL_TABLET | Freq: Every day | ORAL | 0 refills | Status: DC

## 2016-04-06 MED ORDER — HYDROCODONE-HOMATROPINE 5-1.5 MG/5ML PO SYRP
5.0000 mL | ORAL_SOLUTION | Freq: Once | ORAL | Status: AC
  Administered 2016-04-06: 5 mL via ORAL
  Filled 2016-04-06: qty 5

## 2016-04-06 NOTE — ED Triage Notes (Signed)
Pt states that she has had a cough, congestion, and body aches x 5 days.  Pt denies fevers or chills.

## 2016-04-06 NOTE — ED Provider Notes (Signed)
[facility]-EMERGENCY DEPT Provider Note   CSN: [id number] Arrival date & time: 04/06/16  1759     History   Chief Complaint No chief complaint on file.   HPI Patricia Fuentes is a 70 y.o. female.   Cough  This is a new problem. The current episode started more than 2 days ago. The problem occurs constantly. The problem has been gradually worsening. The cough is productive of sputum. There has been no fever. Associated symptoms include chest pain and ear congestion. She has tried cough syrup for the symptoms. The treatment provided no relief.    Past Medical History:  Diagnosis Date  . Chronic back pain   . Essential hypertension   . Hyperlipidemia   . IGT (impaired glucose tolerance) 2015  . Obesity, unspecified   . Osteoarthritis     Patient Active Problem List   Diagnosis Date Noted  . Abnormal CT scan, colon 01/18/2016  . Need for prophylactic vaccination and inoculation against influenza 07/04/2015  . Dyspnea   . Abnormal myocardial perfusion study   . Fatigue 07/18/2014  . Vitamin D deficiency 07/18/2014  . Exercise intolerance 07/18/2014  . Sleep disorder 07/18/2014  . Nonspecific abnormal electrocardiogram (ECG) (EKG) 07/18/2014  . Reduced vision 12/27/2013  . Radicular pain of thoracic region 06/22/2013  . Prediabetes 10/20/2012  . Metabolic syndrome X 10/20/2012  . BACK PAIN WITH RADICULOPATHY 09/20/2008  . Hyperlipidemia LDL goal <100 03/29/2008  . Morbid obesity (HCC) 03/29/2008  . Arthritis of left knee 03/29/2008  . Essential hypertension 10/07/2007    Past Surgical History:  Procedure Laterality Date  . CARDIAC CATHETERIZATION N/A 10/25/2014   Procedure: Left Heart Cath and Coronary Angiography;  Surgeon: Kathleene Hazelhristopher D McAlhany, MD;  [facility];  Service: Cardiovascular;  Laterality: N/A;  . COLONOSCOPY N/A 02/09/2016   Procedure: COLONOSCOPY;  Surgeon: West BaliSandi L Fields, MD;  Location: [facility] ENDO SUITE;  Service: Endoscopy;  Laterality:  N/A;  12:30 PM  . KNEE ARTHROSCOPY Left   . TOTAL ABDOMINAL HYSTERECTOMY  1995 approx   Fibroids  . TOTAL KNEE ARTHROPLASTY Right 2011    OB History    No data available       Home Medications    Prior to Admission medications   Medication Sig Start Date End Date Taking? Authorizing Provider  amLODipine (NORVASC) 5 MG tablet Take 1 tablet (5 mg total) by mouth daily. 01/02/16  Yes Kerri PerchesMargaret E Simpson, MD  aspirin EC 81 MG tablet Take 1 tablet (81 mg total) by mouth daily. 10/19/14  Yes Jonelle SidleSamuel G McDowell, MD  Aspirin-Salicylamide-Caffeine [facility](BC HEADACHE POWDER PO) Take 1 packet by mouth once as needed (pain).   Yes Historical Provider, MD  Cholecalciferol (VITAMIN D3 ADULT GUMMIES PO) Take 1 tablet by mouth daily.   Yes Historical Provider, MD  triamterene-hydrochlorothiazide (MAXZIDE-25) 37.5-25 MG tablet TAKE ONE TABLET BY MOUTH ONCE DAILY. 02/07/16  Yes Kerri PerchesMargaret E Simpson, MD  azithromycin (ZITHROMAX) 250 MG tablet Take 1 tablet (250 mg total) by mouth daily. Take 1 every day until finished. 04/06/16   Marily MemosJason Eward Rutigliano, MD  HYDROcodone-homatropine (HYCODAN) 5-1.5 MG/5ML syrup Take 5 mLs by mouth every 6 (six) hours as needed for cough. 04/06/16   Marily MemosJason Wilbert Schouten, MD    Family History Family History  Problem Relation Age of Onset  . Hypertension Mother   . Stroke Mother 7959    Deceased due CVa at 3959  . Diabetes Father   . Cancer Father 3372    Unknown, somewhere  in stomach  . Hypertension Brother   . Hypertension Brother   . Hypertension Brother   . Cancer Maternal Grandfather     ?type    Social History Social History  Substance Use Topics  . Smoking status: Never Smoker  . Smokeless tobacco: Never Used  . Alcohol use No     Allergies   Promethazine hcl   Review of Systems Review of Systems  HENT: Positive for congestion.   Respiratory: Positive for cough.   Cardiovascular: Positive for chest pain.  All other systems reviewed and are negative.    Physical  Exam Updated Vital Signs BP (!) 111/44   Pulse 84   Temp 98.4 F (36.9 C) (Oral)   Resp 18   Ht 5\' 2"  (1.575 m)   Wt 259 lb (117.5 kg)   SpO2 98%   BMI 47.37 kg/m   Physical Exam  Constitutional: She is oriented to person, place, and time. She appears well-developed and well-nourished.  HENT:  Head: Normocephalic and atraumatic.  Eyes: Conjunctivae and EOM are normal.  Neck: Normal range of motion.  Cardiovascular: Normal rate and regular rhythm.   Pulmonary/Chest: Effort normal. No stridor. No respiratory distress. She has no wheezes.  Abdominal: She exhibits no distension.  Musculoskeletal: Normal range of motion. She exhibits no edema or deformity.  Neurological: She is alert and oriented to person, place, and time.  Skin: Skin is warm and dry.  Nursing note and vitals reviewed.    ED Treatments / Results  Labs (all labs ordered are listed, but only abnormal results are displayed) Labs Reviewed - No data to display  EKG  EKG Interpretation None       Radiology Dg Chest 2 View  Result Date: 04/06/2016 CLINICAL DATA:  Cough with congestion and chest tightness. EXAM: CHEST  2 VIEW COMPARISON:  06/05/2015 FINDINGS: The lungs are clear wiithout focal pneumonia, edema, pneumothorax or pleural effusion. Interstitial markings are diffusely coarsened with chronic features. Hyperexpansion is consistent with emphysema. The cardiopericardial silhouette is within normal limits for size. The visualized bony structures of the thorax are intact. IMPRESSION: Hyperexpansion with chronic interstitial coarsening. Electronically Signed   By: Kennith CenterEric  Mansell M.D.   On: 04/06/2016 19:24    Procedures Procedures (including critical care time)  Medications Ordered in ED Medications  albuterol (PROVENTIL HFA;VENTOLIN HFA) 108 (90 Base) MCG/ACT inhaler 2 puff (2 puffs Inhalation Given 04/06/16 1849)  predniSONE (DELTASONE) tablet 40 mg (40 mg Oral Given 04/06/16 1855)   HYDROcodone-homatropine (HYCODAN) 5-1.5 MG/5ML syrup 5 mL (5 mLs Oral Given 04/06/16 1855)  azithromycin (ZITHROMAX) tablet 500 mg (500 mg Oral Given 04/06/16 2011)     Initial Impression / Assessment and Plan / ED Course  I have reviewed the triage vital signs and the nursing notes.  Pertinent labs & imaging results that were available during my care of the patient were reviewed by me and considered in my medical decision making (see chart for details).  Clinical Course     Bronchitis for > 1 week, will treat with abx/inhaler/cough meds.  Final Clinical Impressions(s) / ED Diagnoses   Final diagnoses:  Bronchitis    New Prescriptions Discharge Medication List as of 04/06/2016  8:00 PM    START taking these medications   Details  azithromycin (ZITHROMAX) 250 MG tablet Take 1 tablet (250 mg total) by mouth daily. Take 1 every day until finished., Starting Sat 04/06/2016, Print    HYDROcodone-homatropine (HYCODAN) 5-1.5 MG/5ML syrup Take 5 mLs by mouth  every 6 (six) hours as needed for cough., Starting Sat 04/06/2016, Print         Marily Memos, MD 04/06/16 2111

## 2016-04-06 NOTE — ED Notes (Signed)
Pt alert & oriented x4, stable gait. Patient given discharge instructions, paperwork & prescription(s). Patient  instructed to stop at the registration desk to finish any additional paperwork. Patient verbalized understanding. Pt left department w/ no further questions. 

## 2016-04-25 DIAGNOSIS — M25562 Pain in left knee: Secondary | ICD-10-CM | POA: Diagnosis not present

## 2016-04-25 DIAGNOSIS — M1712 Unilateral primary osteoarthritis, left knee: Secondary | ICD-10-CM | POA: Diagnosis not present

## 2016-05-22 ENCOUNTER — Other Ambulatory Visit: Payer: Self-pay | Admitting: Family Medicine

## 2016-06-11 ENCOUNTER — Ambulatory Visit: Payer: Self-pay | Admitting: Orthopedic Surgery

## 2016-06-14 ENCOUNTER — Ambulatory Visit: Payer: Self-pay | Admitting: Orthopedic Surgery

## 2016-06-21 ENCOUNTER — Ambulatory Visit: Payer: Self-pay | Admitting: Orthopedic Surgery

## 2016-06-24 ENCOUNTER — Ambulatory Visit (INDEPENDENT_AMBULATORY_CARE_PROVIDER_SITE_OTHER): Payer: Medicare HMO | Admitting: Orthopedic Surgery

## 2016-06-24 ENCOUNTER — Encounter: Payer: Self-pay | Admitting: Orthopedic Surgery

## 2016-06-24 VITALS — BP 127/74 | HR 68 | Wt 255.0 lb

## 2016-06-24 DIAGNOSIS — G8929 Other chronic pain: Secondary | ICD-10-CM | POA: Diagnosis not present

## 2016-06-24 DIAGNOSIS — M1712 Unilateral primary osteoarthritis, left knee: Secondary | ICD-10-CM | POA: Diagnosis not present

## 2016-06-24 DIAGNOSIS — M25562 Pain in left knee: Secondary | ICD-10-CM | POA: Diagnosis not present

## 2016-06-24 NOTE — Patient Instructions (Signed)

## 2016-06-24 NOTE — Progress Notes (Signed)
Chief Complaint  Patient presents with  . Follow-up    left knee, discuss TKA, requests injection    Procedure note left knee injection verbal consent was obtained to inject left knee joint  Timeout was completed to confirm the site of injection  The medications used were 40 mg of Depo-Medrol and 1% lidocaine 3 cc  Anesthesia was provided by ethyl chloride and the skin was prepped with alcohol.  After cleaning the skin with alcohol a 20-gauge needle was used to inject the left knee joint. There were no complications. A sterile bandage was applied.

## 2016-07-09 ENCOUNTER — Ambulatory Visit: Payer: Self-pay

## 2016-07-10 ENCOUNTER — Ambulatory Visit (HOSPITAL_COMMUNITY)
Admission: RE | Admit: 2016-07-10 | Discharge: 2016-07-10 | Disposition: A | Discharge status: Home / Self Care | Payer: Medicare HMO | Source: Ambulatory Visit | Attending: Family Medicine | Admitting: Family Medicine

## 2016-07-10 DIAGNOSIS — Z1231 Encounter for screening mammogram for malignant neoplasm of breast: Secondary | ICD-10-CM | POA: Diagnosis not present

## 2016-07-16 ENCOUNTER — Ambulatory Visit: Payer: Self-pay

## 2016-07-18 ENCOUNTER — Ambulatory Visit (INDEPENDENT_AMBULATORY_CARE_PROVIDER_SITE_OTHER): Payer: Medicare HMO

## 2016-07-18 VITALS — BP 120/68 | HR 68 | Temp 98.8°F | Ht 62.0 in | Wt 254.1 lb

## 2016-07-18 DIAGNOSIS — R7303 Prediabetes: Secondary | ICD-10-CM | POA: Diagnosis not present

## 2016-07-18 DIAGNOSIS — E785 Hyperlipidemia, unspecified: Secondary | ICD-10-CM | POA: Diagnosis not present

## 2016-07-18 DIAGNOSIS — Z13820 Encounter for screening for osteoporosis: Secondary | ICD-10-CM

## 2016-07-18 DIAGNOSIS — I10 Essential (primary) hypertension: Secondary | ICD-10-CM | POA: Diagnosis not present

## 2016-07-18 DIAGNOSIS — E559 Vitamin D deficiency, unspecified: Secondary | ICD-10-CM | POA: Diagnosis not present

## 2016-07-18 DIAGNOSIS — H547 Unspecified visual loss: Secondary | ICD-10-CM | POA: Diagnosis not present

## 2016-07-18 DIAGNOSIS — Z0000 Encounter for general adult medical examination without abnormal findings: Secondary | ICD-10-CM | POA: Diagnosis not present

## 2016-07-18 NOTE — Patient Instructions (Addendum)
[name] , Thank you for taking time to come for your Medicare Wellness Visit. I appreciate your ongoing commitment to your health goals. Please review the following plan we discussed and let me know if I can assist you in the future.    Screening recommendations: Due for bone density. This has been ordered for you today. We will schedule this for you and call you with your appointment time.   Abnormal screenings: None   Patient concerns: Difficulty seeing at night. Patient is requesting a referral to an eye Dr to have her eyes examined. We will send the referral to [facility] In [place] at your request.   Nurse concerns: Obesity. Recommend starting chair exercises at least 3 times a week for at least 30 minutes at a time.   Next appt: Follow up with Dr. Lodema HongSimpson for your annual physical after 01/02/2017. Lab work has been ordered for you to complete the last week in July. Please follow up in 1 year for your annual wellness visit.  Advance directive discussed with patient today. Copy provided for patient to complete at home and have notarized. Patient agrees to have copy sent to our office once it is complete.  Preventive Care 8965 Years and Older, Female Preventive care refers to lifestyle choices and visits with your health care provider that can promote health and wellness. What does preventive care include?  A yearly physical exam. This is also called an annual well check.  Dental exams once or twice a year.  Routine eye exams. Ask your health care provider how often you should have your eyes checked.  Personal lifestyle choices, including:  Daily care of your teeth and gums.  Regular physical activity.  Eating a healthy diet.  Avoiding tobacco and drug use.  Limiting alcohol use.  Practicing safe sex.  Taking low-dose aspirin every day.  Taking vitamin and mineral supplements as recommended by your health care provider. What happens during an annual well  check? The services and screenings done by your health care provider during your annual well check will depend on your age, overall health, lifestyle risk factors, and family history of disease. Counseling  Your health care provider may ask you questions about your:  Alcohol use.  Tobacco use.  Drug use.  Emotional well-being.  Home and relationship well-being.  Sexual activity.  Eating habits.  History of falls.  Memory and ability to understand (cognition).  Work and work Astronomerenvironment.  Reproductive health. Screening  You may have the following tests or measurements:  Height, weight, and BMI.  Blood pressure.  Lipid and cholesterol levels. These may be checked every 5 years, or more frequently if you are over 69 years old.  Skin check.  Lung cancer screening. You may have this screening every year starting at age 69 if you have a 30-pack-year history of smoking and currently smoke or have quit within the past 15 years.  Fecal occult blood test (FOBT) of the stool. You may have this test every year starting at age 69.  Flexible sigmoidoscopy or colonoscopy. You may have a sigmoidoscopy every 5 years or a colonoscopy every 10 years starting at age 69.  Hepatitis C blood test.  Diabetes screening. This is done by checking your blood sugar (glucose) after you have not eaten for a while (fasting). You may have this done every 1-3 years.  Bone density scan. This is done to screen for osteoporosis. You may have this done starting at age 69.  Mammogram.  This may be done every 1-2 years. Talk to your health care provider about how often you should have regular mammograms. Talk with your health care provider about your test results, treatment options, and if necessary, the need for more tests. Vaccines  Your health care provider may recommend certain vaccines, such as:  Influenza vaccine. This is recommended every year.  Tetanus, diphtheria, and acellular pertussis  (Tdap, Td) vaccine. You may need a Td booster every 10 years.  Zoster vaccine. You may need this after age 12.  Pneumococcal 13-valent conjugate (PCV13) vaccine. One dose is recommended after age 42.  Pneumococcal polysaccharide (PPSV23) vaccine. One dose is recommended after age 63. Talk to your health care provider about which screenings and vaccines you need and how often you need them. This information is not intended to replace advice given to you by your health care provider. Make sure you discuss any questions you have with your health care provider. Document Released: 06/09/2015 Document Revised: 01/31/2016 Document Reviewed: 03/14/2015 Elsevier Interactive Patient Education  2017 [employer].

## 2016-07-18 NOTE — Progress Notes (Signed)
Subjective:   Patricia Fuentes is a 71 y.o. female who presents for Medicare Annual (Subsequent) preventive examination.  Review of Systems: N/A Cardiac Risk Factors include: advanced age (>2455men, 38>65 women);dyslipidemia;hypertension;obesity (BMI >30kg/m2);sedentary lifestyle     Objective:     Vitals: BP 120/68   Pulse 68   Temp 98.8 F (37.1 C) (Oral)   Ht 5\' 2"  (1.575 m)   Wt 254 lb 1.9 oz (115.3 kg)   SpO2 92%   BMI 46.48 kg/m   Body mass index is 46.48 kg/m.   Tobacco History  Smoking Status  . Never Smoker  Smokeless Tobacco  . Never Used     Counseling given: Not Answered   Past Medical History:  Diagnosis Date  . Chronic back pain   . Essential hypertension   . Hyperlipidemia   . IGT (impaired glucose tolerance) 2015  . Obesity, unspecified   . Osteoarthritis    Past Surgical History:  Procedure Laterality Date  . CARDIAC CATHETERIZATION N/A 10/25/2014   Procedure: Left Heart Cath and Coronary Angiography;  Surgeon: Kathleene Hazelhristopher D McAlhany, MD;  Location: [facility] INVASIVE CV LAB;  Service: Cardiovascular;  Laterality: N/A;  . COLONOSCOPY N/A 02/09/2016   Procedure: COLONOSCOPY;  Surgeon: West BaliSandi L Fields, MD;  Location: [facility] ENDO SUITE;  Service: Endoscopy;  Laterality: N/A;  12:30 PM  . KNEE ARTHROSCOPY Left   . TOTAL ABDOMINAL HYSTERECTOMY  1995 approx   Fibroids  . TOTAL KNEE ARTHROPLASTY Right 2011   Family History  Problem Relation Age of Onset  . Hypertension Mother   . Stroke Mother 5759    Deceased due CVa at 7659  . Diabetes Father   . Cancer Father 8572    Unknown, somewhere in stomach  . Hypertension Brother   . Hypertension Brother   . Hypertension Brother   . Cancer Maternal Grandfather     ?type  . Hypertension Son    History  Sexual Activity  . Sexual activity: Yes  . Birth control/ protection: Surgical    Outpatient Encounter Prescriptions as of 07/18/2016  Medication Sig  . amLODipine (NORVASC) 5 MG tablet Take 1 tablet (5 mg  total) by mouth daily.  Marland Kitchen. aspirin EC 81 MG tablet Take 1 tablet (81 mg total) by mouth daily.  . Aspirin-Salicylamide-Caffeine (BC HEADACHE POWDER PO) Take 1 packet by mouth once as needed (pain).  . Cholecalciferol (VITAMIN D3 ADULT GUMMIES PO) Take 1 tablet by mouth daily.  Marland Kitchen. HYDROcodone-homatropine (HYCODAN) 5-1.5 MG/5ML syrup Take 5 mLs by mouth every 6 (six) hours as needed for cough.  . triamterene-hydrochlorothiazide (MAXZIDE-25) 37.5-25 MG tablet TAKE ONE TABLET BY MOUTH ONCE DAILY.  . [DISCONTINUED] azithromycin (ZITHROMAX) 250 MG tablet Take 1 tablet (250 mg total) by mouth daily. Take 1 every day until finished. (Patient not taking: Reported on 06/24/2016)   No facility-administered encounter medications on file as of 07/18/2016.     Activities of Daily Living In your present state of health, do you have any difficulty performing the following activities: 07/18/2016  Hearing? N  Vision? Y  Difficulty concentrating or making decisions? N  Walking or climbing stairs? Y  Dressing or bathing? N  Doing errands, shopping? N  Preparing Food and eating ? N  Using the Toilet? N  In the past six months, have you accidently leaked urine? N  Do you have problems with loss of bowel control? N  Managing your Medications? N  Managing your Finances? N  Housekeeping or managing your Housekeeping? N  Some recent data might be hidden    Patient Care Team: Kerri Perches, MD as PCP - General Vickki Hearing, MD as Consulting Physician (Orthopedic Surgery) West Bali, MD as Consulting Physician (Gastroenterology)    Assessment:    Exercise Activities and Dietary recommendations Current Exercise Habits: The patient does not participate in regular exercise at present, Exercise limited by: None identified  Goals    . Exercise 3x per week (30 min per time)          Recommend chair exercises at least 3 times a week for at least 30 minutes at a time as tolerated.      Fall  Risk Fall Risk  07/18/2016 01/02/2016 07/04/2015 07/18/2014 12/27/2013  Falls in the past year? Yes No No No No  Number falls in past yr: 1 - - - -  Injury with Fall? No - - - -  Risk for fall due to : - - - Impaired balance/gait -  Follow up Falls evaluation completed;Falls prevention discussed - - - -   Depression Screen PHQ 2/9 Scores 07/18/2016 07/18/2014 12/15/2012  PHQ - 2 Score 0 0 0     Cognitive Function Normal 6CIT Screen 07/18/2016  What Year? 0 points  What month? 0 points  What time? 0 points  Count back from 20 0 points  Months in reverse 0 points  Repeat phrase 0 points  Total Score 0    Immunization History  Administered Date(s) Administered  . Influenza,inj,Quad PF,36+ Mos 06/22/2013, 07/18/2014, 07/04/2015, 03/21/2016  . Pneumococcal Conjugate-13 12/27/2013  . Pneumococcal Polysaccharide-23 12/15/2012   Screening Tests Health Maintenance  Topic Date Due  . TETANUS/TDAP  08/12/1966  . DEXA SCAN  08/11/2012  . MAMMOGRAM  07/10/2018  . COLONOSCOPY  02/08/2026  . INFLUENZA VACCINE  Completed  . Hepatitis C Screening  Completed  . PNA vac Low Risk Adult  Completed      Plan:  I have personally reviewed and addressed the Medicare Annual Wellness questionnaire and have noted the following in the patient's chart:  A. Medical and social history B. Use of alcohol, tobacco or illicit drugs  C. Current medications and supplements D. Functional ability and status E.  Nutritional status F.  Physical activity G. Advance directives H. List of other physicians I.  Hospitalizations, surgeries, and ER visits in previous 12 months J.  Vitals K. Screenings to include cognitive, depression, and falls L. Referrals and appointments - DEXA ordered today. Community resource referral sent today to help patient schedule her DEXA exam. Referral to Dr. Charise Killian sent today for eye exam due to difficulty seeing at night.  In addition, I have reviewed and discussed with patient certain  preventive protocols, quality metrics, and best practice recommendations. A written personalized care plan for preventive services as well as general preventive health recommendations were provided to patient.  Signed,   Candis Shine, LPN Lead Nurse Health Advisor

## 2016-08-07 ENCOUNTER — Other Ambulatory Visit (HOSPITAL_COMMUNITY): Payer: Self-pay

## 2017-01-06 ENCOUNTER — Encounter: Payer: Self-pay | Admitting: Family Medicine

## 2017-01-16 ENCOUNTER — Other Ambulatory Visit: Payer: Self-pay | Admitting: Family Medicine

## 2017-02-11 ENCOUNTER — Ambulatory Visit (INDEPENDENT_AMBULATORY_CARE_PROVIDER_SITE_OTHER): Payer: Medicare HMO | Admitting: Family Medicine

## 2017-02-11 ENCOUNTER — Encounter: Payer: Self-pay | Admitting: Family Medicine

## 2017-02-11 VITALS — BP 110/62 | HR 83 | Temp 98.8°F | Resp 16 | Ht 62.0 in | Wt 251.0 lb

## 2017-02-11 DIAGNOSIS — Z1211 Encounter for screening for malignant neoplasm of colon: Secondary | ICD-10-CM

## 2017-02-11 DIAGNOSIS — Z0000 Encounter for general adult medical examination without abnormal findings: Secondary | ICD-10-CM

## 2017-02-11 DIAGNOSIS — M1712 Unilateral primary osteoarthritis, left knee: Secondary | ICD-10-CM

## 2017-02-11 DIAGNOSIS — E785 Hyperlipidemia, unspecified: Secondary | ICD-10-CM | POA: Diagnosis not present

## 2017-02-11 DIAGNOSIS — I10 Essential (primary) hypertension: Secondary | ICD-10-CM

## 2017-02-11 DIAGNOSIS — Z780 Asymptomatic menopausal state: Secondary | ICD-10-CM

## 2017-02-11 DIAGNOSIS — E559 Vitamin D deficiency, unspecified: Secondary | ICD-10-CM | POA: Diagnosis not present

## 2017-02-11 DIAGNOSIS — Z23 Encounter for immunization: Secondary | ICD-10-CM | POA: Diagnosis not present

## 2017-02-11 DIAGNOSIS — R7303 Prediabetes: Secondary | ICD-10-CM | POA: Diagnosis not present

## 2017-02-11 LAB — HEMOCCULT GUIAC POC 1CARD (OFFICE): Fecal Occult Blood, POC: NEGATIVE

## 2017-02-11 NOTE — Assessment & Plan Note (Signed)
After obtaining informed consent, the vaccine is  administered by LPN.  

## 2017-02-11 NOTE — Patient Instructions (Signed)
F/u in 5 months, call if you need me sooner  Please get appt and keep it for  Bone density test which you need at checkout  Please get labs today  You are referred to Dr Romeo Apple re left knee  Excellent blood pressure  Flu vaccine today  Thank you  for choosing [facility]. We consider it a privelige to serve you.  Delivering excellent health care in a caring and  compassionate way is our goal.  Partnering with you,  so that together we can achieve this goal is our strategy.    Please work on good  health habits so that your health will improve. 1. Commitment to daily physical activity for 30 to 60  minutes, if you are able to do this.  2. Commitment to wise food choices. Aim for half of your  food intake to be vegetable and fruit, one quarter starchy foods, and one quarter protein. Try to eat on a regular schedule  3 meals per day, snacking between meals should be limited to vegetables or fruits or small portions of nuts. 64 ounces of water per day is generally recommended, unless you have specific health conditions, like heart failure or kidney failure where you will need to limit fluid intake.  3. Commitment to sufficient and a  good quality of physical and mental rest daily, generally between 6 to 8 hours per day.  WITH PERSISTANCE AND PERSEVERANCE, THE IMPOSSIBLE , BECOMES THE NORM! It is important that you exercise regularly at least 30 minutes 5 times a week. If you develop chest pain, have severe difficulty breathing, or feel very tired, stop exercising immediately and seek medical attention

## 2017-02-11 NOTE — Assessment & Plan Note (Signed)
Annual exam as documented. Counseling done  re healthy lifestyle involving commitment to 150 minutes exercise per week, heart healthy diet, and attaining healthy weight.The importance of adequate sleep also discussed. Changes in health habits are decided on by the patient with goals and time frames  set for achieving them. Immunization and cancer screening needs are specifically addressed at this visit. 

## 2017-02-11 NOTE — Progress Notes (Signed)
    Patricia Fuentes     MRN: [id number]      DOB: [birth year]  HPI: Patient is in for annual physical exam. C/o increased left knee pain and instability, requests referral to ortho as desires replacement, denies falls. Imunization is  updated .Marland Kitchen   PE: BP 110/62 (BP Location: Left Arm, Patient Position: Sitting, Cuff Size: Normal)   Pulse 83   Temp 98.8 F (37.1 C) (Other (Comment))   Resp 16   Ht  (1.575 m)   Wt 251 lb (113.9 kg)   SpO2 97%   BMI 45.91 kg/m   Pleasant  female, alert and oriented x 3, in no cardio-pulmonary distress. Afebrile. HEENT No facial trauma or asymetry. Sinuses non tender.  Extra occullar muscles intact, pupils equally reactive to light. External ears normal, tympanic membranes clear. Oropharynx moist, no exudate. Neck: supple, no adenopathy,JVD or thyromegaly.No bruits.  Chest: Clear to ascultation bilaterally.No crackles or wheezes. Non tender to palpation  Breast: No asymetry,no masses or lumps. No tenderness. No nipple discharge or inversion. No axillary or supraclavicular adenopathy  Cardiovascular system; Heart sounds normal,  S1 and  S2 ,no S3.  No murmur, or thrill. Apical beat not displaced Peripheral pulses normal.  Abdomen: Soft, non tender, no organomegaly or masses. No bruits. Bowel sounds normal. No guarding, tenderness or rebound.  Rectal:  Normal sphincter tone. No rectal mass. Guaiac negative stool.  GU: .Asymptomatic, not examined   Musculoskeletal exam: Decreased l ROM of spine, hips , shoulders and markedly reduced ROM left  Knee.  Deformity ,swelling and  crepitus noted in left knee No muscle wasting or atrophy.   Neurologic: Cranial nerves 2 to 12 intact. Power, tone ,sensation and reflexes normal throughout.  No tremor.  Skin: Intact, no ulceration, erythema , scaling or rash noted. Pigmentation normal throughout  Psych; Normal mood and affect. Judgement and concentration  normal   Assessment & Plan:  Annual physical exam Annual exam as documented. Counseling done  re healthy lifestyle involving commitment to 150 minutes exercise per week, heart healthy diet, and attaining healthy weight.The importance of adequate sleep also discussed. Changes in health habits are decided on by the patient with goals and time frames  set for achieving them. Immunization and cancer screening needs are specifically addressed at this visit.   Need for prophylactic vaccination and inoculation against influenza After obtaining informed consent, the vaccine is  administered by LPN.   Arthritis of left knee Increased pain and instability, pt states now ready for replacement, refer to ortho for re eval

## 2017-02-11 NOTE — Assessment & Plan Note (Signed)
Increased pain and instability, pt states now ready for replacement, refer to ortho for re eval

## 2017-02-12 ENCOUNTER — Other Ambulatory Visit: Payer: Self-pay | Admitting: Family Medicine

## 2017-02-12 ENCOUNTER — Encounter: Payer: Self-pay | Admitting: Family Medicine

## 2017-02-12 DIAGNOSIS — E785 Hyperlipidemia, unspecified: Secondary | ICD-10-CM

## 2017-02-12 DIAGNOSIS — I10 Essential (primary) hypertension: Secondary | ICD-10-CM

## 2017-02-12 DIAGNOSIS — R7303 Prediabetes: Secondary | ICD-10-CM

## 2017-02-12 LAB — CBC
HEMATOCRIT: 35.7 % (ref 35.0–45.0)
HEMOGLOBIN: 11.6 g/dL — AB (ref 11.7–15.5)
MCH: 27.4 pg (ref 27.0–33.0)
MCHC: 32.5 g/dL (ref 32.0–36.0)
MCV: 84.4 fL (ref 80.0–100.0)
MPV: 12.1 fL (ref 7.5–12.5)
Platelets: 197 10*3/uL (ref 140–400)
RBC: 4.23 10*6/uL (ref 3.80–5.10)
RDW: 13.4 % (ref 11.0–15.0)
WBC: 6 10*3/uL (ref 3.8–10.8)

## 2017-02-12 LAB — COMPLETE METABOLIC PANEL WITH GFR
AG Ratio: 1 (calc) (ref 1.0–2.5)
ALT: 7 U/L (ref 6–29)
AST: 11 U/L (ref 10–35)
Albumin: 3.8 g/dL (ref 3.6–5.1)
Alkaline phosphatase (APISO): 64 U/L (ref 33–130)
BUN/Creatinine Ratio: 18 (calc) (ref 6–22)
BUN: 19 mg/dL (ref 7–25)
CO2: 30 mmol/L (ref 20–32)
Calcium: 8.8 mg/dL (ref 8.6–10.4)
Chloride: 99 mmol/L (ref 98–110)
Creat: 1.03 mg/dL — ABNORMAL HIGH (ref 0.50–0.99)
GFR, EST AFRICAN AMERICAN: 64 mL/min/{1.73_m2} (ref >=60)
GFR, EST NON AFRICAN AMERICAN: 55 mL/min/{1.73_m2} — AB (ref >=60)
GLUCOSE: 77 mg/dL (ref 65–139)
Globulin: 4 g/dL (calc) — ABNORMAL HIGH (ref 1.9–3.7)
Potassium: 3.8 mmol/L (ref 3.5–5.3)
Sodium: 139 mmol/L (ref 135–146)
TOTAL PROTEIN: 7.8 g/dL (ref 6.1–8.1)
Total Bilirubin: 0.3 mg/dL (ref 0.2–1.2)

## 2017-02-12 LAB — HEMOGLOBIN A1C
EAG (MMOL/L): 7.1 (calc)
HEMOGLOBIN A1C: 6.1 %{Hb} — AB (ref <5.7)
MEAN PLASMA GLUCOSE: 128 (calc)

## 2017-02-12 LAB — LIPID PANEL
Cholesterol: 172 mg/dL (ref <200)
HDL: 61 mg/dL (ref >50)
LDL CHOLESTEROL (CALC): 92 mg/dL
Non-HDL Cholesterol (Calc): 111 mg/dL (calc) (ref <130)
TRIGLYCERIDES: 93 mg/dL (ref <150)
Total CHOL/HDL Ratio: 2.8 (calc) (ref <5.0)

## 2017-02-12 LAB — TSH: TSH: 1.72 m[IU]/L (ref 0.40–4.50)

## 2017-02-12 LAB — VITAMIN D 25 HYDROXY (VIT D DEFICIENCY, FRACTURES): Vit D, 25-Hydroxy: 15 ng/mL — ABNORMAL LOW (ref 30–100)

## 2017-02-12 MED ORDER — ERGOCALCIFEROL 1.25 MG (50000 UT) PO CAPS: 50000.0000 [IU] | ORAL_CAPSULE | ORAL | 1 refills | Status: DC

## 2017-02-18 ENCOUNTER — Inpatient Hospital Stay (HOSPITAL_COMMUNITY): Admission: RE | Admit: 2017-02-18 | Payer: Self-pay | Source: Ambulatory Visit

## 2017-02-24 ENCOUNTER — Other Ambulatory Visit: Payer: Self-pay | Admitting: Family Medicine

## 2017-03-26 ENCOUNTER — Encounter: Payer: Self-pay | Admitting: Orthopedic Surgery

## 2017-03-26 ENCOUNTER — Ambulatory Visit (INDEPENDENT_AMBULATORY_CARE_PROVIDER_SITE_OTHER): Payer: Medicare HMO | Admitting: Orthopedic Surgery

## 2017-03-26 VITALS — BP 125/65 | HR 78 | Ht 62.0 in | Wt 251.0 lb

## 2017-03-26 DIAGNOSIS — M1712 Unilateral primary osteoarthritis, left knee: Secondary | ICD-10-CM

## 2017-03-26 NOTE — Patient Instructions (Signed)
You have decided to proceed with knee replacement surgery. You have decided not to continue with nonoperative measures such as but not limited to oral medication, weight loss, activity modification, physical therapy, bracing, or injection.  We will perform the procedure commonly known as total knee replacement. Some of the risks associated with knee replacement surgery include but are not limited to Bleeding Infection Swelling Stiffness Blood clot Pain that persists even after surgery  Infection is especially devastating complication of knee surgery although rare. If infection does occur your implant will usually have to be removed and several surgeries and antibiotics will be needed to eradicate the infection prior to performing a repeat replacement.   In some cases amputation is required to eradicate the infection. In other rare cases a knee fusion is needed    If you're not comfortable with these risks and would like to continue with nonoperative treatment please let Dr. Harrison know prior to your surgery.  

## 2017-03-26 NOTE — Progress Notes (Signed)
Progress Note   Patient ID: Patricia EmperorSylvia A Basley, female   DOB: [birth year], 71 y.o.   MRN: [id number]  Chief Complaint  Patient presents with  . Knee Pain    History of osteoarthritis. Preop for surgery left knee    She is 71 years old she's had a long history of pain in her left knee primarily over the medial compartment with dull aching moderate pain which is increased to severe pain which is now constant and is aggravated by weightbearing activities.  The patient notes difficulty with activities of daily living such as standing walking getting up and out of a chair. She is using a cane and has been treated with injection and anti-inflammatories and oral analgesics.  She says she is ready for knee surgery     Review of Systems  Constitutional: Negative for chills and fever.  Respiratory:       Occasional activity related shortness of breath  Cardiovascular: Negative for chest pain.  Musculoskeletal: Positive for back pain.  Skin: Negative for rash.  Neurological: Negative for focal weakness.   Current Meds  Medication Sig  . amLODipine (NORVASC) 5 MG tablet Take 1 tablet (5 mg total) by mouth daily.  Marland Kitchen. aspirin EC 81 MG tablet Take 1 tablet (81 mg total) by mouth daily.  . Aspirin-Salicylamide-Caffeine (BC HEADACHE POWDER PO) Take 1 packet by mouth once as needed (pain).  . Cholecalciferol (VITAMIN D3 ADULT GUMMIES PO) Take 1 tablet by mouth daily.  Marland Kitchen. triamterene-hydrochlorothiazide (MAXZIDE-25) 37.5-25 MG tablet TAKE ONE TABLET BY MOUTH ONCE DAILY.    Past Medical History:  Diagnosis Date  . Chronic back pain   . Essential hypertension   . Hyperlipidemia   . IGT (impaired glucose tolerance) 2015  . Obesity, unspecified   . Osteoarthritis      Allergies  Allergen Reactions  . Promethazine Hcl Nausea And Vomiting and Other (See Comments)    dizzy    BP 125/65   Pulse 78   Ht 5\' 2"  (1.575 m)   Wt 251 lb (113.9 kg)   BMI 45.91 kg/m    Physical Exam Gen.  appearance the patient's appearance is normal with normal grooming and  hygiene The patient is oriented to person place and time Mood and affect are normal   Ortho Exam  Tenderness over the medial and lateral joint line of the [place] knee without effusion. Her knee flexion is limited to 105 she has 5 flexion contracture. She has no flexors of the left knee. Her skin is clean  She has no pulse abnormalities in the left lower extremity she has normal sensation  She is ambulatory with a cane in the right hand    Medical decision-making No diagnosis found.  X-rays from October of last year so severe varus deformity of the left knee with obliteration of the medial compartment subchondral sclerosis extensive osteophytes in the patellofemoral and medial compartments  Severe arthritis left knee with varus moderate  We injected her knee to give her pain relief and we will delay the surgery for 30 days Procedure note left knee injection verbal consent was obtained to inject left knee joint  Timeout was completed to confirm the site of injection  The medications used were 40 mg of Depo-Medrol and 1% lidocaine 3 cc  Anesthesia was provided by ethyl chloride and the skin was prepped with alcohol.  After cleaning the skin with alcohol a 20-gauge needle was used to inject the left knee joint. There were no complications.  A sterile bandage was applied.   Left total knee [address] In December 4   Fuller Canada, MD 03/26/2017 4:34 PM

## 2017-03-28 ENCOUNTER — Other Ambulatory Visit: Payer: Self-pay | Admitting: Orthopedic Surgery

## 2017-03-28 DIAGNOSIS — Z01818 Encounter for other preprocedural examination: Secondary | ICD-10-CM

## 2017-03-28 NOTE — Addendum Note (Signed)
Addended by: Vickki HearingHARRISON, Keyon Liller E on: 03/28/2017 08:23 AM   Modules accepted: [employer] Brood[employer]Set

## 2017-04-04 ENCOUNTER — Other Ambulatory Visit: Payer: Self-pay | Admitting: Family Medicine

## 2017-04-10 ENCOUNTER — Telehealth: Payer: Self-pay | Admitting: Radiology

## 2017-04-10 NOTE — Telephone Encounter (Signed)
Called [employer] to initiate prior auth for TKA and this is pending review, there was no case number given. (automated system)

## 2017-04-11 ENCOUNTER — Telehealth: Payer: Self-pay | Admitting: Orthopedic Surgery

## 2017-04-11 NOTE — Telephone Encounter (Signed)
[facility] voice message: per Herbert SetaHeather, requesting clinicals for pending case#/Reference# [id number] - states includes but not limited to medical notes, history and physical, Xray reports, labs. Please fax to Fax# [phone]. If questions, please call [phone]

## 2017-04-11 NOTE — Telephone Encounter (Signed)
Thank you so much. I  Have faxed

## 2017-04-15 ENCOUNTER — Other Ambulatory Visit: Payer: Self-pay | Admitting: Orthopedic Surgery

## 2017-04-15 DIAGNOSIS — M1712 Unilateral primary osteoarthritis, left knee: Secondary | ICD-10-CM

## 2017-04-21 ENCOUNTER — Telehealth: Payer: Self-pay | Admitting: Orthopedic Surgery

## 2017-04-21 NOTE — Telephone Encounter (Signed)
Patient has question about the order of CPM following her total knee replacement surgery, scheduled 04/29/17; states she plans to be discharged to out-patient rehab afterward, not to home.  Please advise.

## 2017-04-21 NOTE — Telephone Encounter (Signed)
I spoke to patient, told her I will call her back after discussing with you. Does the CPM go with her to the facility?  Also [facility].

## 2017-04-22 NOTE — Telephone Encounter (Signed)
No on the cpm  [facility] if available if not will have to go to facility approved by her insurar

## 2017-04-22 NOTE — Telephone Encounter (Signed)
I will call [employer] to advise. [phone] ext [phone]

## 2017-04-23 NOTE — Patient Instructions (Signed)
Patricia Fuentes  04/23/2017     @PREFPERIOPPHARMACY @   Your procedure is scheduled on  04/29/2017   Report to [facility] at  615  A.M.  Call this number if you have problems the morning of surgery:  [phone]   Remember:  Do not eat food or drink liquids after midnight.  Take these medicines the morning of surgery with A SIP OF WATER  maxzide.   Do not wear jewelry, make-up or nail polish.  Do not wear lotions, powders, or perfumes, or deoderant.  Do not shave 48 hours prior to surgery.  Men may shave face and neck.  Do not bring valuables to the hospital.  [facility] is not responsible for any belongings or valuables.  Contacts, dentures or bridgework may not be worn into surgery.  Leave your suitcase in the car.  After surgery it may be brought to your room.  For patients admitted to the hospital, discharge time will be determined by your treatment team.  Patients discharged the day of surgery will not be allowed to drive home.   Name and phone number of your driver:   family Special instructions:  None  Please read over the following fact sheets that you were given. Pain Booklet, Coughing and Deep Breathing, Blood Transfusion Information, Total Joint Packet, MRSA Information, Surgical Site Infection Prevention, Anesthesia Post-op Instructions and Care and Recovery After Surgery       Total Knee Replacement Total knee replacement is a procedure to replace the knee joint with an artificial (prosthetic) knee joint. The purpose of this surgery is to reduce knee pain and improve knee function. The prosthetic knee joint (prosthesis) is usually made of metal and plastic. It replaces parts of the thigh bone (femur), lower leg bone (tibia), and kneecap (patella) that are removed during the procedure. Tell a health care provider about:  Any allergies you have.  All medicines you are taking, including vitamins, herbs, eye drops, creams, and  over-the-counter medicines.  Any problems you or family members have had with anesthetic medicines.  Any blood disorders you have.  Any surgeries you have had.  Any medical conditions you have.  Whether you are pregnant or may be pregnant. What are the risks? Generally, this is a safe procedure. However, problems may occur, including:  Infection.  Bleeding.  Allergic reactions to medicines.  Damage to other structures or organs.  Decreased range of motion of the knee.  Instability of the knee.  Loosening of the prosthetic joint.  Knee pain that does not go away (chronic pain).  What happens before the procedure?  Ask your health care provider about: ? Changing or stopping your regular medicines. This is especially important if you are taking diabetes medicines or blood thinners. ? Taking medicines such as aspirin and ibuprofen. These medicines can thin your blood. Do not take these medicines before your procedure if your health care provider instructs you not to.  Have dental care and routine cleanings completed before your procedure. Plan to not have dental work done for 3 months after your procedure. Germs from anywhere in your body, including your mouth, can travel to your new joint and infect it.  Follow instructions from your health care provider about eating or drinking restrictions.  Ask your health care provider how your surgical site will be marked or identified.  You may be given antibiotic medicine to help prevent infection.  If  your health care provider prescribes physical therapy, do exercises as instructed.  Do not use any tobacco products, such as cigarettes, chewing tobacco, or e-cigarettes. If you need help quitting, ask your health care provider.  You may have a physical exam.  You may have tests, such as: ? X-rays. ? MRI. ? CT scan. ? Bone scans.  You may have a blood or urine sample taken.  Plan to have someone take you home after the  procedure.  If you will be going home right after the procedure, plan to have someone with you for at least 24 hours. It is recommended that you have someone to help care for you for at least 4-6 weeks after your procedure. What happens during the procedure?  To reduce your risk of infection: ? Your health care team will wash or sanitize their hands. ? Your skin will be washed with soap.  An IV tube will be inserted into one of your veins.  You will be given one or more of the following: ? A medicine to help you relax (sedative). ? A medicine to numb the area (local anesthetic). ? A medicine to make you fall asleep (general anesthetic). ? A medicine that is injected into your spine to numb the area below and slightly above the injection site (spinal anesthetic). ? A medicine that is injected into an area of your body to numb everything below the injection site (regional anesthetic).  An incision will be made in your knee.  Damaged cartilage and bone will be removed from your femur, tibia, and patella.  Parts of the prosthesis (liners)will be placed over the areas of bone and cartilage that were removed. A metal liner will be placed over your femur, and plastic liners will be placed over your tibia and the underside of your patella.  One or more small tubes (drains) may be placed near your incision to help drain extra fluid from your surgical site.  Your incision will be closed with stitches (sutures), skin glue, or adhesive strips. Medicine may be applied to your incision.  A bandage (dressing) will be placed over your incision. The procedure may vary among health care providers and hospitals. What happens after the procedure?  Your blood pressure, heart rate, breathing rate, and blood oxygen level will be monitored often until the medicines you were given have worn off.  You may continue to receive fluids and medicines through an IV tube.  You will have some pain. Pain medicines  will be available to help you.  You may have fluid coming from one or more drains in your incision.  You may have to wear compression stockings. These stockings help to prevent blood clots and reduce swelling in your legs.  You will be encouraged to move around as much as possible.  You may be given a continuous passive motion machine to use at home. You will be shown how to use this machine.  Do not drive for 24 hours if you received a sedative. This information is not intended to replace advice given to you by your health care provider. Make sure you discuss any questions you have with your health care provider. Document Released: 08/19/2000 Document Revised: 01/15/2016 Document Reviewed: 04/19/2015 Elsevier Interactive Patient Education  2017 [employer]  Total Knee Replacement, Care After Refer to this sheet in the next few weeks. These instructions provide you with information about caring for yourself after your procedure. Your health care provider may also give you more specific instructions.  Your treatment has been planned according to current medical practices, but problems sometimes occur. Call your health care provider if you have any problems or questions after your procedure. What can I expect after the procedure? After the procedure, it is common to have:  Pain and swelling.  A small amount of blood or clear fluid coming from your incision.  Limited range of motion.  Follow these instructions at home: Medicines  Take over-the-counter and prescription medicines only as told by your health care provider.  If you were prescribed an antibiotic medicine, take it as told by your health care provider. Do not stop taking the antibiotic even if you start to feel better.  If you were prescribed a blood thinner (anticoagulant), take it as told by your health care provider. If you have a splint or brace:  Wear the immobilizer as told by your health care provider. Remove it  only as told by your health care provider.  Loosen the immobilizer if your toes tingle, become numb, or turn cold and blue.  Do not let your immobilizer get wet if it is not waterproof.  Keep the immobilizer clean. Bathing   Do not take baths, swim, or use a hot tub until your health care provider approves. Ask your health care provider if you can take showers. You may only be allowed to take sponge baths for bathing.  If you have an immobilizer that is not waterproof, cover it with a watertight covering when you take a bath or shower.  Keep your bandage (dressing) dry until your health care provider says it can be removed. Incision care and drain care  Check your incision area and drain site every day for signs of infection. Check for: ? More redness, swelling, or pain. ? More fluid or blood. ? Warmth. ? Pus or a bad smell.  Follow instructions from your health care provider about how to take care of your incision. Make sure you: ? Wash your hands with soap and water before you change your dressing. If soap and water are not available, use hand sanitizer. ? Change your dressing as told by your health care provider. ? Leave stitches (sutures), skin glue, or adhesive strips in place. These skin closures may need to stay in place for 2 weeks or longer. If adhesive strip edges start to loosen and curl up, you may trim the loose edges. Do not remove adhesive strips completely unless your health care provider tells you to do that.  If you have a drain, follow instructions from your health care provider about caring for it. Do not remove the drain tube or any dressings around the tube opening unless your health care provider approves. Managing pain, stiffness, and swelling  If directed, put ice on your knee. ? Put ice in a plastic bag. ? Place a towel between your skin and the bag. ? Leave the ice on for 20 minutes, 2-3 times per day.  If directed, apply heat to the affected area as  often as told by your health care provider. Use the heat source that your health care provider recommends, such as a moist heat pack or a heating pad. ? Place a towel between your skin and the heat source. ? Leave the heat on for 20-30 minutes. ? Remove the heat if your skin turns bright red. This is especially important if you are unable to feel pain, heat, or cold. You may have a greater risk of getting burned.  Move your toes often  to avoid stiffness and to lessen swelling.  Raise (elevate) your knee above the level of your heart while you are sitting or lying down.  Wear elastic knee support for as long as told by your health care provider. Driving   Do not drive until your health care provider approves. Ask your health care provider when it is safe to drive if you have an immobilizer on your knee.  Do not drive or operate heavy machinery while taking prescription pain medicine.  Do not drive for 24 hours if you received a sedative. Activity  Do not lift anything that is heavier than 10 lb (4.5 kg) until your health care provider approves.  Do not play contact sports until your health care provider approves.  Avoid high-impact activities, including running, jumping rope, and jumping jacks.  Avoid sitting for a long time without moving. Get up and move around at least every few hours.  If physical therapy was prescribed, do exercises as told by your health care provider.  Return to your normal activities as told by your health care provider. Ask your health care provider what activities are safe for you. Safety  Do not use your leg to support your body weight until your health care provider approves. Use crutches or a walker as told by your health care provider. General instructions   Do not have any dental work done for at least 3 months after your surgery. When you do have dental work done, tell your dentist about your joint replacement.  Do not use any tobacco products,  such as cigarettes, chewing tobacco, or e-cigarettes. If you need help quitting, ask your health care provider.  Wear compression stockings as told by your health care provider. These stockings help to prevent blood clots and reduce swelling in your legs.  If you have been sent home with a continuous passive motion machine, use it as told by your health care provider.  Drink enough fluid to keep your urine clear or pale yellow.  If you have been instructed to lose weight, follow instructions from your health care provider about how to do this safely.  Keep all follow-up visits as told by your health care provider. This is important. Contact a health care provider if:  You have more redness, swelling, or pain around your incision or drain.  You have more fluid or blood coming from your incision or drain.  Your incision or drain site feels warm to the touch.  You have pus or a bad smell coming from your incision or drain.  You have a fever.  Your incision breaks open after your health care provider removes your sutures, skin glue, or adhesive tape.  Your prosthesis feels loose.  You have knee pain that does not go away. Get help right away if:  You have a rash.  You have pain or swelling in your calf or thigh.  You have shortness of breath or difficulty breathing.  You have chest pain.  Your range of motion in your knee is getting worse. This information is not intended to replace advice given to you by your health care provider. Make sure you discuss any questions you have with your health care provider. Document Released: 11/30/2004 Document Revised: 01/15/2016 Document Reviewed: 04/19/2015 Elsevier Interactive Patient Education  2017 Elsevier Inc.  Spinal Anesthesia and Epidural Anesthesia Spinal anesthesia and epidural anesthesia are ways to numb a part of the body. They are often used:  During childbirth.  During surgery on certain parts  of the body. These  include: ? Hip. ? Pelvis. ? Legs. ? Lower belly (abdomen).  After surgery on certain parts of the body. These include: ? Belly. ? Chest.  What happens before the procedure? Staying hydrated Follow instructions from your doctor about drinking (hydration). These may include:  Up to 2 hours before the procedure - you may continue to drink clear liquids. Some examples of clear liquids are: ? Water. ? Clear fruit juice. ? Black coffee. ? Plain tea.  Eating and drinking restrictions Follow instructions from your doctor about eating and drinking. These may include:  8 hours before the procedure - stop eating heavy meals or foods. Examples of these are meat, fried foods, or fatty foods.  6 hours before the procedure - stop eating light meals or foods. Examples of these are toast or cereal.  6 hours before the procedure - stop drinking milk or drinks that have milk in them.  2 hours before the procedure - stop drinking clear liquids.  Medicine Ask your doctor about:  Changing or stopping your regular medicines. This is especially important if you are taking diabetes medicines or blood thinners.  Changing or stopping your dietary supplements.  Taking medicines such as aspirin and ibuprofen. These medicines can thin your blood. Do not take these medicines before your procedure if your doctor tells you not to.  General instructions   Do not use any tobacco products for as long as possible. ? Examples of these are cigarettes, chewing tobacco, and e-cigarettes. ? If you need help quitting, ask your doctor.  Ask your doctor if you will have to stay overnight at the hospital or clinic.  If you will not have to stay overnight: ? Plan to have someone take you home. ? Plan to have someone with you for 24 hours. What happens during the procedure?  A doctor will put patches on your chest, a cuff around your arm, or a sensor device on your finger. They will be connected to  monitors.  An IV tube may be put into one of your veins. This tube is used to give fluids and medicines.  You may be given a medicine to help you relax (sedative).  You may be asked to: ? Sit up. ? Lie on your side. ? Bend your knees and chin toward your chest.  An area of your back will be cleaned.  You may get a shot of medicine in your back. The medicine will help prevent pain from the shot of numbing medicine.  A needle will be put between the bones of your back. While this is being done: ? Breathe normally. ? Try not to move. ? Stay as quiet as you can. ? Tell your doctor if you feel a tingling shock or pain going down your leg.  You will get the shot of numbing medicine.  If you need more medicine, a tube (catheter) may be put in the place where you got the shot. The tube will be used to give you more medicine. It will be left in if you need pain medicine after the procedure.  A bandage (dressing) may be put on your back. The procedure may vary among doctors and hospitals. What happens after the procedure?  Stay in bed until your doctor says it is safe to walk.  Your doctors will check on you often until the medicines wear off.  If there is a tube in your back, it will be taken out when it is no  longer needed.  Do not drive for 24 hours if you were given a medicine to help you relax (sedative).  It is common to feel sick to your stomach (nauseous) and itchy. There are medicines that can help. It is also common to: ? Be sleepy. ? Throw up (vomit). ? Have numbness or tingling in your legs. ? Have trouble peeing (urinating). This information is not intended to replace advice given to you by your health care provider. Make sure you discuss any questions you have with your health care provider. Document Released: 09/04/2015 Document Revised: 10/24/2015 Document Reviewed: 09/04/2015 Elsevier Interactive Patient Education  2018 [employer]  Spinal Anesthesia and  Epidural Anesthesia, Care After These instructions give you information about caring for yourself after your procedure. Your doctor may also give you more specific instructions. Call your doctor if you have any problems or questions after your procedure. Follow these instructions at home: For at least 24 hours after the procedure:   Do not: ? Do activities where you could fall or get hurt (injured). ? Drive. ? Use heavy machinery. ? Drink alcohol. ? Take sleeping pills or medicines that make you sleepy (drowsy). ? Make important decisions. ? Sign legal documents. ? Take care of children on your own.  Rest. Eating and drinking  If you throw up (vomit), drink water, juice, or soup when you can drink without throwing up.  Make sure you do not feel like throwing up (are not nauseous) before you eat.  Follow the diet that is recommended by your doctor. General instructions  Have a responsible adult stay with you until you are awake and alert.  Take over-the-counter and prescription medicines only as told by your doctor.  If you smoke, do not smoke unless an adult is watching.  Keep all follow-up visits as told by your doctor. This is important. Contact a doctor if:  It has been more than one day since your procedure and you feel like throwing up.  It has been more than one day since your procedure and you throw up.  You have a rash. Get help right away if:  You have a fever.  You have a headache that lasts a long time.  You have a very bad headache.  Your vision is blurry.  You see two of a single object (double vision).  You are dizzy or light-headed.  You faint.  Your arms or legs tingle, feel weak, or get numb.  You have trouble breathing.  You cannot pee (urinate). This information is not intended to replace advice given to you by your health care provider. Make sure you discuss any questions you have with your health care provider. Document Released:  09/04/2015 Document Revised: 01/04/2016 Document Reviewed: 09/04/2015 Elsevier Interactive Patient Education  2018 [employer]  General Anesthesia, Adult General anesthesia is the use of medicines to make a person "go to sleep" (be unconscious) for a medical procedure. General anesthesia is often recommended when a procedure:  Is long.  Requires you to be still or in an unusual position.  Is major and can cause you to lose blood.  Is impossible to do without general anesthesia.  The medicines used for general anesthesia are called general anesthetics. In addition to making you sleep, the medicines:  Prevent pain.  Control your blood pressure.  Relax your muscles.  Tell a health care provider about:  Any allergies you have.  All medicines you are taking, including vitamins, herbs, eye drops, creams, and over-the-counter  medicines.  Any problems you or family members have had with anesthetic medicines.  Types of anesthetics you have had in the past.  Any bleeding disorders you have.  Any surgeries you have had.  Any medical conditions you have.  Any history of heart or lung conditions, such as heart failure, sleep apnea, or chronic obstructive pulmonary disease (COPD).  Whether you are pregnant or may be pregnant.  Whether you use tobacco, alcohol, marijuana, or street drugs.  Any history of Financial planner.  Any history of depression or anxiety. What are the risks? Generally, this is a safe procedure. However, problems may occur, including:  Allergic reaction to anesthetics.  Lung and heart problems.  Inhaling food or liquids from your stomach into your lungs (aspiration).  Injury to nerves.  Waking up during your procedure and being unable to move (rare).  Extreme agitation or a state of mental confusion (delirium) when you wake up from the anesthetic.  Air in the bloodstream, which can lead to stroke.  These problems are more likely to develop if  you are having a major surgery or if you have an advanced medical condition. You can prevent some of these complications by answering all of your health care provider's questions thoroughly and by following all pre-procedure instructions. General anesthesia can cause side effects, including:  Nausea or vomiting  A sore throat from the breathing tube.  Feeling cold or shivery.  Feeling tired, washed out, or achy.  Sleepiness or drowsiness.  Confusion or agitation.  What happens before the procedure? Staying hydrated Follow instructions from your health care provider about hydration, which may include:  Up to 2 hours before the procedure - you may continue to drink clear liquids, such as water, clear fruit juice, black coffee, and plain tea.  Eating and drinking restrictions Follow instructions from your health care provider about eating and drinking, which may include:  8 hours before the procedure - stop eating heavy meals or foods such as meat, fried foods, or fatty foods.  6 hours before the procedure - stop eating light meals or foods, such as toast or cereal.  6 hours before the procedure - stop drinking milk or drinks that contain milk.  2 hours before the procedure - stop drinking clear liquids.  Medicines  Ask your health care provider about: ? Changing or stopping your regular medicines. This is especially important if you are taking diabetes medicines or blood thinners. ? Taking medicines such as aspirin and ibuprofen. These medicines can thin your blood. Do not take these medicines before your procedure if your health care provider instructs you not to. ? Taking new dietary supplements or medicines. Do not take these during the week before your procedure unless your health care provider approves them.  If you are told to take a medicine or to continue taking a medicine on the day of the procedure, take the medicine with sips of water. General instructions   Ask if  you will be going home the same day, the following day, or after a longer hospital stay. ? Plan to have someone take you home. ? Plan to have someone stay with you for the first 24 hours after you leave the hospital or clinic.  For 3-6 weeks before the procedure, try not to use any tobacco products, such as cigarettes, chewing tobacco, and e-cigarettes.  You may brush your teeth on the morning of the procedure, but make sure to spit out the toothpaste. What happens during the procedure?  You will be given anesthetics through a mask and through an IV tube in one of your veins.  You may receive medicine to help you relax (sedative).  As soon as you are asleep, a breathing tube may be used to help you breathe.  An anesthesia specialist will stay with you throughout the procedure. He or she will help keep you comfortable and safe by continuing to give you medicines and adjusting the amount of medicine that you get. He or she will also watch your blood pressure, pulse, and oxygen levels to make sure that the anesthetics do not cause any problems.  If a breathing tube was used to help you breathe, it will be removed before you wake up. The procedure may vary among health care providers and hospitals. What happens after the procedure?  You will wake up, often slowly, after the procedure is complete, usually in a recovery area.  Your blood pressure, heart rate, breathing rate, and blood oxygen level will be monitored until the medicines you were given have worn off.  You may be given medicine to help you calm down if you feel anxious or agitated.  If you will be going home the same day, your health care provider may check to make sure you can stand, drink, and urinate.  Your health care providers will treat your pain and side effects before you go home.  Do not drive for 24 hours if you received a sedative.  You may: ? Feel nauseous and vomit. ? Have a sore throat. ? Have mental  slowness. ? Feel cold or shivery. ? Feel sleepy. ? Feel tired. ? Feel sore or achy, even in parts of your body where you did not have surgery. This information is not intended to replace advice given to you by your health care provider. Make sure you discuss any questions you have with your health care provider. Document Released: 08/20/2007 Document Revised: 10/24/2015 Document Reviewed: 04/27/2015 Elsevier Interactive Patient Education  2018 [employer]. General Anesthesia, Adult, Care After These instructions provide you with information about caring for yourself after your procedure. Your health care provider may also give you more specific instructions. Your treatment has been planned according to current medical practices, but problems sometimes occur. Call your health care provider if you have any problems or questions after your procedure. What can I expect after the procedure? After the procedure, it is common to have:  Vomiting.  A sore throat.  Mental slowness.  It is common to feel:  Nauseous.  Cold or shivery.  Sleepy.  Tired.  Sore or achy, even in parts of your body where you did not have surgery.  Follow these instructions at home: For at least 24 hours after the procedure:  Do not: ? Participate in activities where you could fall or become injured. ? Drive. ? Use heavy machinery. ? Drink alcohol. ? Take sleeping pills or medicines that cause drowsiness. ? Make important decisions or sign legal documents. ? Take care of children on your own.  Rest. Eating and drinking  If you vomit, drink water, juice, or soup when you can drink without vomiting.  Drink enough fluid to keep your urine clear or pale yellow.  Make sure you have little or no nausea before eating solid foods.  Follow the diet recommended by your health care provider. General instructions  Have a responsible adult stay with you until you are awake and alert.  Return to your normal  activities as told by your  health care provider. Ask your health care provider what activities are safe for you.  Take over-the-counter and prescription medicines only as told by your health care provider.  If you smoke, do not smoke without supervision.  Keep all follow-up visits as told by your health care provider. This is important. Contact a health care provider if:  You continue to have nausea or vomiting at home, and medicines are not helpful.  You cannot drink fluids or start eating again.  You cannot urinate after 8-12 hours.  You develop a skin rash.  You have fever.  You have increasing redness at the site of your procedure. Get help right away if:  You have difficulty breathing.  You have chest pain.  You have unexpected bleeding.  You feel that you are having a life-threatening or urgent problem. This information is not intended to replace advice given to you by your health care provider. Make sure you discuss any questions you have with your health care provider. Document Released: 08/19/2000 Document Revised: 10/16/2015 Document Reviewed: 04/27/2015 Elsevier Interactive Patient Education  [employer].

## 2017-04-24 ENCOUNTER — Other Ambulatory Visit: Payer: Self-pay | Admitting: Orthopedic Surgery

## 2017-04-24 DIAGNOSIS — Z01818 Encounter for other preprocedural examination: Secondary | ICD-10-CM

## 2017-04-24 NOTE — Telephone Encounter (Signed)
Authorization received, same as the Reference number.

## 2017-04-25 ENCOUNTER — Ambulatory Visit (HOSPITAL_COMMUNITY)
Admission: RE | Admit: 2017-04-25 | Discharge: 2017-04-25 | Disposition: A | Discharge status: Home / Self Care | Payer: Medicare HMO | Source: Ambulatory Visit | Attending: Anesthesiology | Admitting: Anesthesiology

## 2017-04-25 ENCOUNTER — Ambulatory Visit (HOSPITAL_COMMUNITY): Payer: Self-pay

## 2017-04-25 ENCOUNTER — Other Ambulatory Visit: Payer: Self-pay

## 2017-04-25 ENCOUNTER — Encounter (HOSPITAL_COMMUNITY)
Admission: RE | Admit: 2017-04-25 | Discharge: 2017-04-25 | Disposition: A | Discharge status: Home / Self Care | Payer: Medicare HMO | Source: Ambulatory Visit | Attending: Orthopedic Surgery | Admitting: Orthopedic Surgery

## 2017-04-25 ENCOUNTER — Encounter (HOSPITAL_COMMUNITY): Payer: Self-pay

## 2017-04-25 DIAGNOSIS — Z01818 Encounter for other preprocedural examination: Secondary | ICD-10-CM | POA: Diagnosis not present

## 2017-04-25 DIAGNOSIS — Z01810 Encounter for preprocedural cardiovascular examination: Secondary | ICD-10-CM | POA: Insufficient documentation

## 2017-04-25 DIAGNOSIS — Z01812 Encounter for preprocedural laboratory examination: Secondary | ICD-10-CM | POA: Insufficient documentation

## 2017-04-25 HISTORY — DX: Bronchitis, not specified as acute or chronic: J40

## 2017-04-25 HISTORY — DX: Other seasonal allergic rhinitis: J30.2

## 2017-04-25 LAB — CBC WITH DIFFERENTIAL/PLATELET
BASOS PCT: 0 %
Basophils Absolute: 0 10*3/uL (ref 0.0–0.1)
EOS ABS: 0.3 10*3/uL (ref 0.0–0.7)
EOS PCT: 5 %
HCT: 39 % (ref 36.0–46.0)
Hemoglobin: 11.7 g/dL — ABNORMAL LOW (ref 12.0–15.0)
LYMPHS ABS: 1.8 10*3/uL (ref 0.7–4.0)
Lymphocytes Relative: 30 %
MCH: 27.3 pg (ref 26.0–34.0)
MCHC: 30 g/dL (ref 30.0–36.0)
MCV: 90.9 fL (ref 78.0–100.0)
MONOS PCT: 7 %
Monocytes Absolute: 0.4 10*3/uL (ref 0.1–1.0)
Neutro Abs: 3.4 10*3/uL (ref 1.7–7.7)
Neutrophils Relative %: 58 %
PLATELETS: 220 10*3/uL (ref 150–400)
RBC: 4.29 MIL/uL (ref 3.87–5.11)
RDW: 14.5 % (ref 11.5–15.5)
WBC: 5.9 10*3/uL (ref 4.0–10.5)

## 2017-04-25 LAB — COMPREHENSIVE METABOLIC PANEL
ALK PHOS: 78 U/L (ref 38–126)
ALT: 12 U/L — AB (ref 14–54)
ANION GAP: 9 (ref 5–15)
AST: 17 U/L (ref 15–41)
Albumin: 3.6 g/dL (ref 3.5–5.0)
BILIRUBIN TOTAL: 0.5 mg/dL (ref 0.3–1.2)
BUN: 18 mg/dL (ref 6–20)
CALCIUM: 8.9 mg/dL (ref 8.9–10.3)
CO2: 33 mmol/L — AB (ref 22–32)
CREATININE: 0.85 mg/dL (ref 0.44–1.00)
Chloride: 94 mmol/L — ABNORMAL LOW (ref 101–111)
Glucose, Bld: 102 mg/dL — ABNORMAL HIGH (ref 65–99)
Potassium: 4 mmol/L (ref 3.5–5.1)
SODIUM: 136 mmol/L (ref 135–145)
TOTAL PROTEIN: 7.9 g/dL (ref 6.5–8.1)

## 2017-04-25 LAB — SURGICAL PCR SCREEN
MRSA, PCR: NEGATIVE
STAPHYLOCOCCUS AUREUS: NEGATIVE

## 2017-04-25 LAB — PREPARE RBC (CROSSMATCH)

## 2017-04-25 MED ORDER — CHLORHEXIDINE GLUCONATE 4 % EX LIQD: 60.0000 mL | Freq: Once | CUTANEOUS | Status: DC

## 2017-04-25 NOTE — Progress Notes (Signed)
   04/25/17 0903  OBSTRUCTIVE SLEEP APNEA  Have you ever been diagnosed with sleep apnea through a sleep study? No  Do you snore loudly (loud enough to be heard through closed doors)?  1  Do you often feel tired, fatigued, or sleepy during the daytime (such as falling asleep during driving or talking to someone)? 1  Has anyone observed you stop breathing during your sleep? 0  Do you have, or are you being treated for high blood pressure? 1  BMI more than 35 kg/m2? 1  Age > 50 (1-yes) 1  Neck circumference greater than:Female 16 inches or larger, Female 17inches or larger? 0  Female Gender (Yes=1) 0  Obstructive Sleep Apnea Score 5  Score 5 or greater  Results sent to PCP

## 2017-04-28 NOTE — H&P (Signed)
Patient ID: Patricia EmperorSylvia A Fuentes, female   DOB: 06/30/1945, 71 y.o.   MRN: [id number]       Chief Complaint  Patient presents with  . Knee Pain      History of osteoarthritis. Preop for surgery left knee      She is 71 years old she's had a long history of pain in her left knee primarily over the medial compartment with dull aching moderate pain which is increased to severe pain which is now constant and is aggravated by weightbearing activities.   The patient notes difficulty with activities of daily living such as standing walking getting up and out of a chair. She is using a cane and has been treated with injection and anti-inflammatories and oral analgesics.   She says she is ready for knee surgery         Review of Systems  Constitutional: Negative for chills and fever.  Respiratory:       Occasional activity related shortness of breath  Cardiovascular: Negative for chest pain.  Musculoskeletal: Positive for back pain.  Skin: Negative for rash.  Neurological: Negative for focal weakness.    All other systems were normal   Active Medications      Current Meds  Medication Sig  . amLODipine (NORVASC) 5 MG tablet Take 1 tablet (5 mg total) by mouth daily.  Marland Kitchen. aspirin EC 81 MG tablet Take 1 tablet (81 mg total) by mouth daily.  . Aspirin-Salicylamide-Caffeine (BC HEADACHE POWDER PO) Take 1 packet by mouth once as needed (pain).  . Cholecalciferol (VITAMIN D3 ADULT GUMMIES PO) Take 1 tablet by mouth daily.  Marland Kitchen. triamterene-hydrochlorothiazide (MAXZIDE-25) 37.5-25 MG tablet TAKE ONE TABLET BY MOUTH ONCE DAILY.            Past Medical History:  Diagnosis Date  . Chronic back pain    . Essential hypertension    . Hyperlipidemia    . IGT (impaired glucose tolerance) 2015  . Obesity, unspecified    . Osteoarthritis        Social History   Tobacco Use  . Smoking status: Never Smoker  . Smokeless tobacco: Never Used  Substance Use Topics  . Alcohol use: No  . Drug use:  No   Family History  Problem Relation Age of Onset  . Hypertension Mother   . Stroke Mother [id number]       Deceased due CVa at 159  . Diabetes Father   . Cancer Father [id number]       Unknown, somewhere in stomach  . Hypertension Brother   . Hypertension Brother   . Hypertension Brother   . Cancer Maternal Grandfather        ?type  . Hypertension Son    Past Surgical History:  Procedure Laterality Date  . CARDIAC CATHETERIZATION N/A 10/25/2014   Procedure: Left Heart Cath and Coronary Angiography;  Surgeon: Kathleene Hazelhristopher D McAlhany, MD;  [facility];  Service: Cardiovascular;  Laterality: N/A;  . COLONOSCOPY N/A 02/09/2016   Procedure: COLONOSCOPY;  Surgeon: West BaliSandi L Fields, MD;  Location: [facility] ENDO SUITE;  Service: Endoscopy;  Laterality: N/A;  12:30 PM  . KNEE ARTHROSCOPY Left   . TOTAL ABDOMINAL HYSTERECTOMY  1995 approx   Fibroids  . TOTAL KNEE ARTHROPLASTY Right 2011           Allergies  Allergen Reactions  . Promethazine Hcl Nausea And Vomiting and Other (See Comments)      dizzy  BP 125/65   Pulse 78   Ht 5\' 2"  (1.575 m)   Wt 251 lb (113.9 kg)   BMI 45.91 kg/m      Physical Exam Gen. appearance the patient's appearance is normal with normal grooming and  hygiene The patient is oriented to person place and time Mood and affect are normal   HEENT shows no gross abnormalities  Her upper extremities are normal in terms of range of motion stability strength and alignment skin is normal pulses good lymph nodes negative normal sensation coordination normal as well.   Ortho Exam   Tenderness over the medial and lateral joint line of the [place] knee without effusion. Her knee flexion is limited to 105 she has 5 flexion contracture. She has no flexors of the left knee. Her skin is clean   She has no pulse abnormalities in the left lower extremity she has normal sensation   She is ambulatory with a cane in the right hand       Medical decision-making No  diagnosis found.   X-rays from October of last year so severe varus deformity of the left knee with obliteration of the medial compartment subchondral sclerosis extensive osteophytes in the patellofemoral and medial compartments   Severe arthritis left knee with varus moderate   She does understand that she has significant amount of obesity which may lead to early prosthetic wear but she is at end-stage and cannot exercise because of pain and overall deconditioning.      Left total knee [id number] In December 4

## 2017-04-29 ENCOUNTER — Inpatient Hospital Stay (HOSPITAL_COMMUNITY)
Admission: RE | Admit: 2017-04-29 | Discharge: 2017-05-02 | DRG: 470 | Disposition: A | Discharge status: Skilled Nursing Facility | Payer: Medicare HMO | Source: Ambulatory Visit | Attending: Orthopedic Surgery | Admitting: Orthopedic Surgery

## 2017-04-29 ENCOUNTER — Encounter (HOSPITAL_COMMUNITY)
Admission: RE | Disposition: A | Discharge status: Skilled Nursing Facility | Payer: Self-pay | Source: Ambulatory Visit | Attending: Orthopedic Surgery

## 2017-04-29 ENCOUNTER — Inpatient Hospital Stay (HOSPITAL_COMMUNITY): Payer: Medicare HMO | Admitting: Anesthesiology

## 2017-04-29 ENCOUNTER — Encounter: Payer: Self-pay | Admitting: Family Medicine

## 2017-04-29 ENCOUNTER — Other Ambulatory Visit: Payer: Self-pay

## 2017-04-29 ENCOUNTER — Inpatient Hospital Stay (HOSPITAL_COMMUNITY): Payer: Medicare HMO

## 2017-04-29 ENCOUNTER — Encounter (HOSPITAL_COMMUNITY): Payer: Self-pay | Admitting: *Deleted

## 2017-04-29 DIAGNOSIS — Z888 Allergy status to other drugs, medicaments and biological substances status: Secondary | ICD-10-CM

## 2017-04-29 DIAGNOSIS — Z96652 Presence of left artificial knee joint: Secondary | ICD-10-CM | POA: Diagnosis not present

## 2017-04-29 DIAGNOSIS — G8929 Other chronic pain: Secondary | ICD-10-CM | POA: Diagnosis not present

## 2017-04-29 DIAGNOSIS — M21162 Varus deformity, not elsewhere classified, left knee: Secondary | ICD-10-CM | POA: Diagnosis present

## 2017-04-29 DIAGNOSIS — Z96661 Presence of right artificial ankle joint: Secondary | ICD-10-CM | POA: Diagnosis not present

## 2017-04-29 DIAGNOSIS — M170 Bilateral primary osteoarthritis of knee: Secondary | ICD-10-CM | POA: Diagnosis not present

## 2017-04-29 DIAGNOSIS — I10 Essential (primary) hypertension: Secondary | ICD-10-CM | POA: Diagnosis not present

## 2017-04-29 DIAGNOSIS — E669 Obesity, unspecified: Secondary | ICD-10-CM | POA: Diagnosis not present

## 2017-04-29 DIAGNOSIS — R279 Unspecified lack of coordination: Secondary | ICD-10-CM | POA: Diagnosis not present

## 2017-04-29 DIAGNOSIS — E785 Hyperlipidemia, unspecified: Secondary | ICD-10-CM | POA: Diagnosis not present

## 2017-04-29 DIAGNOSIS — Z4789 Encounter for other orthopedic aftercare: Secondary | ICD-10-CM | POA: Diagnosis not present

## 2017-04-29 DIAGNOSIS — M1990 Unspecified osteoarthritis, unspecified site: Secondary | ICD-10-CM | POA: Diagnosis not present

## 2017-04-29 DIAGNOSIS — M1712 Unilateral primary osteoarthritis, left knee: Principal | ICD-10-CM

## 2017-04-29 DIAGNOSIS — M6281 Muscle weakness (generalized): Secondary | ICD-10-CM | POA: Diagnosis not present

## 2017-04-29 DIAGNOSIS — Z6841 Body Mass Index (BMI) 40.0 and over, adult: Secondary | ICD-10-CM | POA: Diagnosis not present

## 2017-04-29 DIAGNOSIS — R262 Difficulty in walking, not elsewhere classified: Secondary | ICD-10-CM | POA: Diagnosis not present

## 2017-04-29 DIAGNOSIS — K5900 Constipation, unspecified: Secondary | ICD-10-CM | POA: Diagnosis not present

## 2017-04-29 DIAGNOSIS — Z800 Family history of malignant neoplasm of digestive organs: Secondary | ICD-10-CM | POA: Diagnosis not present

## 2017-04-29 DIAGNOSIS — Z8249 Family history of ischemic heart disease and other diseases of the circulatory system: Secondary | ICD-10-CM | POA: Diagnosis not present

## 2017-04-29 DIAGNOSIS — D62 Acute posthemorrhagic anemia: Secondary | ICD-10-CM | POA: Diagnosis not present

## 2017-04-29 DIAGNOSIS — M549 Dorsalgia, unspecified: Secondary | ICD-10-CM | POA: Diagnosis not present

## 2017-04-29 DIAGNOSIS — Z96659 Presence of unspecified artificial knee joint: Secondary | ICD-10-CM

## 2017-04-29 DIAGNOSIS — R110 Nausea: Secondary | ICD-10-CM | POA: Diagnosis not present

## 2017-04-29 HISTORY — PX: TOTAL KNEE ARTHROPLASTY: SHX125

## 2017-04-29 SURGERY — ARTHROPLASTY, KNEE, TOTAL
Anesthesia: General | Site: Knee | Laterality: Left

## 2017-04-29 MED ORDER — TRIAMTERENE-HCTZ 37.5-25 MG PO TABS
1.0000 | ORAL_TABLET | Freq: Every day | ORAL | Status: DC
  Administered 2017-04-30 – 2017-05-01 (×2): 1 via ORAL
  Filled 2017-04-29 (×3): qty 1

## 2017-04-29 MED ORDER — SODIUM CHLORIDE 0.9 % IV SOLN
INTRAVENOUS | Status: DC | PRN
  Administered 2017-04-29: 60 mL

## 2017-04-29 MED ORDER — VITAMIN D (ERGOCALCIFEROL) 1.25 MG (50000 UNIT) PO CAPS
50000.0000 [IU] | ORAL_CAPSULE | ORAL | Status: DC
  Administered 2017-04-30: 50000 [IU] via ORAL
  Filled 2017-04-29: qty 1

## 2017-04-29 MED ORDER — KETOROLAC TROMETHAMINE 15 MG/ML IJ SOLN
7.5000 mg | Freq: Four times a day (QID) | INTRAMUSCULAR | Status: AC
  Administered 2017-04-29 – 2017-04-30 (×4): 7.5 mg via INTRAVENOUS
  Filled 2017-04-29 (×4): qty 1

## 2017-04-29 MED ORDER — SUCCINYLCHOLINE CHLORIDE 20 MG/ML IJ SOLN
INTRAMUSCULAR | Status: DC | PRN
  Administered 2017-04-29: 140 mg via INTRAVENOUS

## 2017-04-29 MED ORDER — OXYCODONE HCL 5 MG PO TABS
5.0000 mg | ORAL_TABLET | Freq: Once | ORAL | Status: AC
  Administered 2017-04-29: 5 mg via ORAL
  Filled 2017-04-29: qty 1

## 2017-04-29 MED ORDER — ONDANSETRON HCL 4 MG PO TABS: 4.0000 mg | ORAL_TABLET | Freq: Four times a day (QID) | ORAL | Status: DC | PRN

## 2017-04-29 MED ORDER — NEOSTIGMINE METHYLSULFATE 10 MG/10ML IV SOLN
INTRAVENOUS | Status: AC
  Filled 2017-04-29: qty 1

## 2017-04-29 MED ORDER — LIDOCAINE HCL (PF) 1 % IJ SOLN
INTRAMUSCULAR | Status: AC
  Filled 2017-04-29: qty 5

## 2017-04-29 MED ORDER — METHOCARBAMOL 1000 MG/10ML IJ SOLN
500.0000 mg | Freq: Once | INTRAVENOUS | Status: AC
  Administered 2017-04-29: 500 mg via INTRAVENOUS
  Filled 2017-04-29: qty 550

## 2017-04-29 MED ORDER — MENTHOL 3 MG MT LOZG: 1.0000 | LOZENGE | OROMUCOSAL | Status: DC | PRN

## 2017-04-29 MED ORDER — ACETAMINOPHEN 325 MG PO TABS: 650.0000 mg | ORAL_TABLET | ORAL | Status: DC | PRN

## 2017-04-29 MED ORDER — SODIUM CHLORIDE 0.9 % IV SOLN
INTRAVENOUS | Status: DC
  Administered 2017-04-29 – 2017-05-01 (×4): via INTRAVENOUS

## 2017-04-29 MED ORDER — SODIUM CHLORIDE 0.9 % IJ SOLN
INTRAMUSCULAR | Status: AC
  Filled 2017-04-29: qty 40

## 2017-04-29 MED ORDER — LACTATED RINGERS IV SOLN
INTRAVENOUS | Status: DC
  Administered 2017-04-29 (×2): via INTRAVENOUS

## 2017-04-29 MED ORDER — BUPIVACAINE-EPINEPHRINE (PF) 0.5% -1:200000 IJ SOLN
INTRAMUSCULAR | Status: DC | PRN
  Administered 2017-04-29: 30 mL via PERINEURAL

## 2017-04-29 MED ORDER — TRANEXAMIC ACID 1000 MG/10ML IV SOLN
INTRAVENOUS | Status: AC
  Filled 2017-04-29: qty 10

## 2017-04-29 MED ORDER — ACETAMINOPHEN 500 MG PO TABS
1000.0000 mg | ORAL_TABLET | Freq: Four times a day (QID) | ORAL | Status: AC
  Administered 2017-04-29 (×2): 1000 mg via ORAL
  Filled 2017-04-29 (×2): qty 2

## 2017-04-29 MED ORDER — HYDROCODONE-ACETAMINOPHEN 10-325 MG PO TABS
1.0000 | ORAL_TABLET | ORAL | Status: DC | PRN
  Administered 2017-05-02: 1 via ORAL
  Filled 2017-04-29: qty 1

## 2017-04-29 MED ORDER — ONDANSETRON HCL 4 MG/2ML IJ SOLN
4.0000 mg | Freq: Four times a day (QID) | INTRAMUSCULAR | Status: DC | PRN
  Administered 2017-04-29 – 2017-04-30 (×2): 4 mg via INTRAVENOUS
  Filled 2017-04-29 (×2): qty 2

## 2017-04-29 MED ORDER — ROCURONIUM BROMIDE 50 MG/5ML IV SOLN
INTRAVENOUS | Status: AC
  Filled 2017-04-29: qty 1

## 2017-04-29 MED ORDER — VITAMIN D 1000 UNITS PO TABS
1000.0000 [IU] | ORAL_TABLET | Freq: Every day | ORAL | Status: DC
  Administered 2017-04-29 – 2017-05-02 (×4): 1000 [IU] via ORAL
  Filled 2017-04-29 (×3): qty 1

## 2017-04-29 MED ORDER — VITAMIN D 1000 UNITS PO TABS
1000.0000 [IU] | ORAL_TABLET | Freq: Every day | ORAL | Status: DC
  Administered 2017-04-29: 1000 [IU] via ORAL
  Filled 2017-04-29 (×2): qty 1

## 2017-04-29 MED ORDER — METOCLOPRAMIDE HCL 5 MG/ML IJ SOLN: 5.0000 mg | Freq: Three times a day (TID) | INTRAMUSCULAR | Status: DC | PRN

## 2017-04-29 MED ORDER — ACETAMINOPHEN 650 MG RE SUPP: 650.0000 mg | RECTAL | Status: DC | PRN

## 2017-04-29 MED ORDER — CELECOXIB 100 MG PO CAPS
200.0000 mg | ORAL_CAPSULE | Freq: Two times a day (BID) | ORAL | Status: DC
  Administered 2017-04-29 – 2017-05-02 (×6): 200 mg via ORAL
  Filled 2017-04-29 (×7): qty 2

## 2017-04-29 MED ORDER — SENNOSIDES-DOCUSATE SODIUM 8.6-50 MG PO TABS: 1.0000 | ORAL_TABLET | Freq: Every evening | ORAL | Status: DC | PRN

## 2017-04-29 MED ORDER — OXYCODONE HCL 5 MG/5ML PO SOLN: 5.0000 mg | Freq: Once | ORAL | Status: DC | PRN

## 2017-04-29 MED ORDER — CEFAZOLIN SODIUM-DEXTROSE 1-4 GM/50ML-% IV SOLN
1.0000 g | Freq: Four times a day (QID) | INTRAVENOUS | Status: AC
  Administered 2017-04-29 (×2): 1 g via INTRAVENOUS
  Filled 2017-04-29 (×2): qty 50

## 2017-04-29 MED ORDER — LIDOCAINE HCL (CARDIAC) 10 MG/ML IV SOLN
INTRAVENOUS | Status: DC | PRN
  Administered 2017-04-29: 50 mg via INTRAVENOUS

## 2017-04-29 MED ORDER — METHOCARBAMOL 1000 MG/10ML IJ SOLN
500.0000 mg | Freq: Four times a day (QID) | INTRAVENOUS | Status: DC | PRN
  Filled 2017-04-29: qty 5

## 2017-04-29 MED ORDER — MIDAZOLAM HCL 2 MG/2ML IJ SOLN
INTRAMUSCULAR | Status: DC | PRN
  Administered 2017-04-29 (×2): 1 mg via INTRAVENOUS

## 2017-04-29 MED ORDER — METOCLOPRAMIDE HCL 10 MG PO TABS: 5.0000 mg | ORAL_TABLET | Freq: Three times a day (TID) | ORAL | Status: DC | PRN

## 2017-04-29 MED ORDER — BUPIVACAINE LIPOSOME 1.3 % IJ SUSP
INTRAMUSCULAR | Status: AC
  Filled 2017-04-29: qty 20

## 2017-04-29 MED ORDER — BISACODYL 5 MG PO TBEC: 5.0000 mg | DELAYED_RELEASE_TABLET | Freq: Every day | ORAL | Status: DC | PRN

## 2017-04-29 MED ORDER — DIPHENHYDRAMINE HCL 12.5 MG/5ML PO ELIX: 12.5000 mg | ORAL_SOLUTION | ORAL | Status: DC | PRN

## 2017-04-29 MED ORDER — BUPIVACAINE IN DEXTROSE 0.75-8.25 % IT SOLN
INTRATHECAL | Status: AC
  Filled 2017-04-29: qty 2

## 2017-04-29 MED ORDER — KETOROLAC TROMETHAMINE 30 MG/ML IJ SOLN
INTRAMUSCULAR | Status: AC
  Filled 2017-04-29: qty 1

## 2017-04-29 MED ORDER — PREGABALIN 50 MG PO CAPS
50.0000 mg | ORAL_CAPSULE | Freq: Once | ORAL | Status: AC
  Administered 2017-04-29: 50 mg via ORAL
  Filled 2017-04-29: qty 1

## 2017-04-29 MED ORDER — BUPIVACAINE-EPINEPHRINE (PF) 0.5% -1:200000 IJ SOLN
INTRAMUSCULAR | Status: AC
  Filled 2017-04-29: qty 30

## 2017-04-29 MED ORDER — HYDROMORPHONE HCL 1 MG/ML IJ SOLN: 0.5000 mg | INTRAMUSCULAR | Status: DC | PRN

## 2017-04-29 MED ORDER — FENTANYL CITRATE (PF) 250 MCG/5ML IJ SOLN
INTRAMUSCULAR | Status: AC
  Filled 2017-04-29: qty 5

## 2017-04-29 MED ORDER — 0.9 % SODIUM CHLORIDE (POUR BTL) OPTIME
TOPICAL | Status: DC | PRN
  Administered 2017-04-29: 1000 mL

## 2017-04-29 MED ORDER — METHOCARBAMOL 500 MG PO TABS: 500.0000 mg | ORAL_TABLET | Freq: Four times a day (QID) | ORAL | Status: DC | PRN

## 2017-04-29 MED ORDER — HYDROCODONE-ACETAMINOPHEN 7.5-325 MG PO TABS
1.0000 | ORAL_TABLET | Freq: Four times a day (QID) | ORAL | Status: DC
  Administered 2017-04-29 – 2017-05-01 (×5): 1 via ORAL
  Filled 2017-04-29 (×5): qty 1

## 2017-04-29 MED ORDER — FLEET ENEMA 7-19 GM/118ML RE ENEM: 1.0000 | ENEMA | Freq: Once | RECTAL | Status: DC | PRN

## 2017-04-29 MED ORDER — MIDAZOLAM HCL 2 MG/2ML IJ SOLN
1.0000 mg | Freq: Once | INTRAMUSCULAR | Status: AC | PRN
  Administered 2017-04-29: 2 mg via INTRAVENOUS
  Filled 2017-04-29: qty 2

## 2017-04-29 MED ORDER — FENTANYL CITRATE (PF) 100 MCG/2ML IJ SOLN
INTRAMUSCULAR | Status: DC | PRN
  Administered 2017-04-29: 100 ug via INTRAVENOUS
  Administered 2017-04-29 (×8): 50 ug via INTRAVENOUS

## 2017-04-29 MED ORDER — PROPOFOL 10 MG/ML IV BOLUS
INTRAVENOUS | Status: AC
  Filled 2017-04-29: qty 40

## 2017-04-29 MED ORDER — ONDANSETRON 4 MG PO TBDP
4.0000 mg | ORAL_TABLET | Freq: Once | ORAL | Status: AC
  Administered 2017-04-29: 4 mg via ORAL
  Filled 2017-04-29: qty 1

## 2017-04-29 MED ORDER — ROCURONIUM BROMIDE 100 MG/10ML IV SOLN
INTRAVENOUS | Status: DC | PRN
  Administered 2017-04-29 (×2): 10 mg via INTRAVENOUS
  Administered 2017-04-29: 35 mg via INTRAVENOUS
  Administered 2017-04-29: 5 mg via INTRAVENOUS
  Administered 2017-04-29: 10 mg via INTRAVENOUS

## 2017-04-29 MED ORDER — FENTANYL CITRATE (PF) 100 MCG/2ML IJ SOLN: 25.0000 ug | INTRAMUSCULAR | Status: DC | PRN

## 2017-04-29 MED ORDER — TRANEXAMIC ACID 1000 MG/10ML IV SOLN
1000.0000 mg | Freq: Once | INTRAVENOUS | Status: DC
  Filled 2017-04-29: qty 10

## 2017-04-29 MED ORDER — DEXAMETHASONE SODIUM PHOSPHATE 10 MG/ML IJ SOLN
10.0000 mg | Freq: Once | INTRAMUSCULAR | Status: AC
  Administered 2017-04-30: 10 mg via INTRAVENOUS
  Filled 2017-04-29: qty 1

## 2017-04-29 MED ORDER — PROPOFOL 10 MG/ML IV BOLUS
INTRAVENOUS | Status: DC | PRN
  Administered 2017-04-29: 30 mg via INTRAVENOUS
  Administered 2017-04-29: 150 mg via INTRAVENOUS
  Administered 2017-04-29: 20 mg via INTRAVENOUS

## 2017-04-29 MED ORDER — CEFAZOLIN SODIUM-DEXTROSE 2-4 GM/100ML-% IV SOLN
2.0000 g | INTRAVENOUS | Status: AC
  Administered 2017-04-29: 2 g via INTRAVENOUS
  Filled 2017-04-29: qty 100

## 2017-04-29 MED ORDER — NEOSTIGMINE METHYLSULFATE 10 MG/10ML IV SOLN
INTRAVENOUS | Status: DC | PRN
  Administered 2017-04-29: 4 mg via INTRAVENOUS

## 2017-04-29 MED ORDER — ALUM & MAG HYDROXIDE-SIMETH 200-200-20 MG/5ML PO SUSP: 30.0000 mL | ORAL | Status: DC | PRN

## 2017-04-29 MED ORDER — HYDROMORPHONE HCL 1 MG/ML IJ SOLN
0.5000 mg | INTRAMUSCULAR | Status: DC | PRN
  Administered 2017-04-29 (×4): 0.5 mg via INTRAVENOUS
  Filled 2017-04-29: qty 1

## 2017-04-29 MED ORDER — GLYCOPYRROLATE 0.2 MG/ML IJ SOLN
INTRAMUSCULAR | Status: DC | PRN
  Administered 2017-04-29: 0.6 mg via INTRAVENOUS

## 2017-04-29 MED ORDER — HYDROMORPHONE HCL 1 MG/ML IJ SOLN
INTRAMUSCULAR | Status: AC
  Filled 2017-04-29: qty 1

## 2017-04-29 MED ORDER — GLYCOPYRROLATE 0.2 MG/ML IJ SOLN
INTRAMUSCULAR | Status: AC
  Filled 2017-04-29: qty 3

## 2017-04-29 MED ORDER — CELECOXIB 400 MG PO CAPS
400.0000 mg | ORAL_CAPSULE | Freq: Once | ORAL | Status: AC
  Administered 2017-04-29: 400 mg via ORAL
  Filled 2017-04-29: qty 1

## 2017-04-29 MED ORDER — SODIUM CHLORIDE 0.9 % IR SOLN
Status: DC | PRN
  Administered 2017-04-29: 3000 mL

## 2017-04-29 MED ORDER — AMLODIPINE BESYLATE 5 MG PO TABS
5.0000 mg | ORAL_TABLET | Freq: Every day | ORAL | Status: DC
  Administered 2017-04-29 – 2017-05-01 (×3): 5 mg via ORAL
  Filled 2017-04-29 (×3): qty 1

## 2017-04-29 MED ORDER — ASPIRIN 81 MG PO CHEW
81.0000 mg | CHEWABLE_TABLET | Freq: Two times a day (BID) | ORAL | Status: DC
  Administered 2017-04-29 – 2017-05-02 (×6): 81 mg via ORAL
  Filled 2017-04-29 (×6): qty 1

## 2017-04-29 MED ORDER — PHENOL 1.4 % MT LIQD: 1.0000 | OROMUCOSAL | Status: DC | PRN

## 2017-04-29 MED ORDER — MIDAZOLAM HCL 2 MG/2ML IJ SOLN
INTRAMUSCULAR | Status: AC
  Filled 2017-04-29: qty 2

## 2017-04-29 MED ORDER — TRANEXAMIC ACID 1000 MG/10ML IV SOLN
1000.0000 mg | INTRAVENOUS | Status: DC
  Filled 2017-04-29: qty 10

## 2017-04-29 MED ORDER — OXYCODONE HCL 5 MG PO TABS: 5.0000 mg | ORAL_TABLET | Freq: Once | ORAL | Status: DC | PRN

## 2017-04-29 MED ORDER — GABAPENTIN 300 MG PO CAPS
300.0000 mg | ORAL_CAPSULE | Freq: Three times a day (TID) | ORAL | Status: DC
  Administered 2017-04-29 – 2017-05-02 (×9): 300 mg via ORAL
  Filled 2017-04-29 (×9): qty 1

## 2017-04-29 MED ORDER — OXYCODONE HCL 5 MG PO TABS
10.0000 mg | ORAL_TABLET | ORAL | Status: DC | PRN
  Filled 2017-04-29 (×2): qty 2

## 2017-04-29 MED ORDER — SUCCINYLCHOLINE CHLORIDE 20 MG/ML IJ SOLN
INTRAMUSCULAR | Status: AC
  Filled 2017-04-29: qty 1

## 2017-04-29 MED ORDER — ONDANSETRON HCL 4 MG/2ML IJ SOLN
4.0000 mg | Freq: Once | INTRAMUSCULAR | Status: AC
  Administered 2017-04-29: 4 mg via INTRAVENOUS
  Filled 2017-04-29: qty 2

## 2017-04-29 MED ORDER — DOCUSATE SODIUM 100 MG PO CAPS
100.0000 mg | ORAL_CAPSULE | Freq: Two times a day (BID) | ORAL | Status: DC
  Administered 2017-04-29 – 2017-05-02 (×7): 100 mg via ORAL
  Filled 2017-04-29 (×7): qty 1

## 2017-04-29 SURGICAL SUPPLY — 75 items
ANCH SUT 2 CRKSCW 15.5X6.5 (Anchor) ×1 IMPLANT
ANCH SUT CRKSW FT 1.3X (Anchor) IMPLANT
ANCHOR CORKSCREW 5.0 FIBERWIRE (Anchor) ×1 IMPLANT
ANCHOR CORKSCREW 6.5 FIBERWIRE (Anchor) ×1 IMPLANT
ANCHOR SUT BIOCOMP CORKSREW (Anchor) IMPLANT
BAG HAMPER (MISCELLANEOUS) ×2 IMPLANT
BANDAGE ELASTIC 4 LF NS (GAUZE/BANDAGES/DRESSINGS) ×2 IMPLANT
BANDAGE ELASTIC 6 LF NS (GAUZE/BANDAGES/DRESSINGS) ×1 IMPLANT
BANDAGE ESMARK 6X9 LF (GAUZE/BANDAGES/DRESSINGS) ×1 IMPLANT
BIT DRILL 3.2X128 (BIT) ×1 IMPLANT
BLADE HEX COATED 2.75 (ELECTRODE) ×2 IMPLANT
BLADE SAGITTAL 25.0X1.27X90 (BLADE) ×2 IMPLANT
BNDG CMPR 9X6 STRL LF SNTH (GAUZE/BANDAGES/DRESSINGS) ×1
BNDG CMPR MED 5X4 ELC HKLP NS (GAUZE/BANDAGES/DRESSINGS) ×2
BNDG CMPR MED 5X6 ELC HKLP NS (GAUZE/BANDAGES/DRESSINGS) ×1
BNDG ESMARK 6X9 LF (GAUZE/BANDAGES/DRESSINGS) ×2
CAP KNEE TOTAL 3 SIGMA ×1 IMPLANT
CEMENT HV SMART SET (Cement) ×4 IMPLANT
CLOTH BEACON ORANGE TIMEOUT ST (SAFETY) ×2 IMPLANT
COOLER CRYO CUFF IC AND MOTOR (MISCELLANEOUS) ×2 IMPLANT
COVER LIGHT HANDLE STERIS (MISCELLANEOUS) ×4 IMPLANT
CUFF CRYO KNEE LG 20X31 COOLER (ORTHOPEDIC SUPPLIES) ×1 IMPLANT
CUFF TOURNIQUET SINGLE 34IN LL (TOURNIQUET CUFF) ×1 IMPLANT
DECANTER SPIKE VIAL GLASS SM (MISCELLANEOUS) ×2 IMPLANT
DRAPE BACK TABLE (DRAPES) ×2 IMPLANT
DRAPE EXTREMITY T 121X128X90 (DRAPE) ×2 IMPLANT
DRESSING AQUACEL AG ADV 3.5X12 (MISCELLANEOUS) ×1 IMPLANT
DRSG AQUACEL AG ADV 3.5X12 (MISCELLANEOUS) ×2
DRSG MEPILEX BORDER 4X12 (GAUZE/BANDAGES/DRESSINGS) ×2 IMPLANT
DURAPREP 26ML APPLICATOR (WOUND CARE) ×4 IMPLANT
ELECT REM PT RETURN 9FT ADLT (ELECTROSURGICAL) ×2
ELECTRODE REM PT RTRN 9FT ADLT (ELECTROSURGICAL) ×1 IMPLANT
EVACUATOR 3/16  PVC DRAIN (DRAIN) ×1
EVACUATOR 3/16 PVC DRAIN (DRAIN) ×1 IMPLANT
GLOVE BIO SURGEON STRL SZ7 (GLOVE) ×4 IMPLANT
GLOVE BIOGEL PI IND STRL 7.0 (GLOVE) ×2 IMPLANT
GLOVE BIOGEL PI INDICATOR 7.0 (GLOVE) ×4
GLOVE SKINSENSE NS SZ8.0 LF (GLOVE) ×2
GLOVE SKINSENSE STRL SZ8.0 LF (GLOVE) ×2 IMPLANT
GLOVE SS N UNI LF 8.5 STRL (GLOVE) ×3 IMPLANT
GOWN STRL REUS W/ TWL LRG LVL3 (GOWN DISPOSABLE) ×1 IMPLANT
GOWN STRL REUS W/TWL LRG LVL3 (GOWN DISPOSABLE) ×6 IMPLANT
GOWN STRL REUS W/TWL XL LVL3 (GOWN DISPOSABLE) ×2 IMPLANT
HANDPIECE INTERPULSE COAX TIP (DISPOSABLE) ×2
HOOD W/PEELAWAY (MISCELLANEOUS) ×8 IMPLANT
INST SET MAJOR BONE (KITS) ×2 IMPLANT
IV NS IRRIG 3000ML ARTHROMATIC (IV SOLUTION) ×2 IMPLANT
KIT BLADEGUARD II DBL (SET/KITS/TRAYS/PACK) ×2 IMPLANT
KIT ROOM TURNOVER APOR (KITS) ×2 IMPLANT
MANIFOLD NEPTUNE II (INSTRUMENTS) ×2 IMPLANT
MARKER SKIN DUAL TIP RULER LAB (MISCELLANEOUS) ×2 IMPLANT
NDL HYPO 21X1.5 SAFETY (NEEDLE) ×1 IMPLANT
NEEDLE HYPO 21X1.5 SAFETY (NEEDLE) ×2 IMPLANT
NS IRRIG 1000ML POUR BTL (IV SOLUTION) ×2 IMPLANT
PACK TOTAL JOINT (CUSTOM PROCEDURE TRAY) ×2 IMPLANT
PAD ARMBOARD 7.5X6 YLW CONV (MISCELLANEOUS) ×2 IMPLANT
PAD DANNIFLEX CPM (ORTHOPEDIC SUPPLIES) ×2 IMPLANT
PIN TROCAR 3 INCH (PIN) IMPLANT
SAW OSC TIP CART 19.5X105X1.3 (SAW) ×2 IMPLANT
SET BASIN LINEN APH (SET/KITS/TRAYS/PACK) ×2 IMPLANT
SET HNDPC FAN SPRY TIP SCT (DISPOSABLE) ×1 IMPLANT
STAPLER VISISTAT 35W (STAPLE) ×2 IMPLANT
SUT BONE WAX W31G (SUTURE) ×1 IMPLANT
SUT BRALON NAB BRD #1 30IN (SUTURE) ×4 IMPLANT
SUT MNCRL 0 VIOLET CTX 36 (SUTURE) ×1 IMPLANT
SUT MON AB 0 CT1 (SUTURE) ×2 IMPLANT
SUT MONOCRYL 0 CTX 36 (SUTURE) ×1
SYR 20CC LL (SYRINGE) ×1 IMPLANT
SYR 30ML LL (SYRINGE) ×2 IMPLANT
SYR BULB IRRIGATION 50ML (SYRINGE) ×2 IMPLANT
TOWEL OR 17X26 4PK STRL BLUE (TOWEL DISPOSABLE) ×2 IMPLANT
TOWER CARTRIDGE SMART MIX (DISPOSABLE) ×2 IMPLANT
TRAY FOLEY W/METER SILVER 16FR (SET/KITS/TRAYS/PACK) ×2 IMPLANT
WATER STERILE IRR 1000ML POUR (IV SOLUTION) ×4 IMPLANT
YANKAUER SUCT 12FT TUBE ARGYLE (SUCTIONS) ×2 IMPLANT

## 2017-04-29 NOTE — Anesthesia Procedure Notes (Signed)
Procedure Name: Intubation Date/Time: 04/29/2017 7:43 AM Performed by: Vista Deck, CRNA Pre-anesthesia Checklist: Patient identified, Patient being monitored, Timeout performed, Emergency Drugs available and Suction available Patient Re-evaluated:Patient Re-evaluated prior to induction Oxygen Delivery Method: Circle System Utilized Preoxygenation: Pre-oxygenation with 100% oxygen Induction Type: IV induction Ventilation: Mask ventilation without difficulty Laryngoscope Size: Mac and 3 Grade View: Grade I Tube type: Oral Tube size: 7.0 mm Number of attempts: 1 Airway Equipment and Method: stylet Placement Confirmation: ETT inserted through vocal cords under direct vision,  positive ETCO2 and breath sounds checked- equal and bilateral Secured at: 22 cm Tube secured with: Tape Dental Injury: Teeth and Oropharynx as per pre-operative assessment

## 2017-04-29 NOTE — Anesthesia Postprocedure Evaluation (Signed)
Anesthesia Post Note  Patient: Patricia Fuentes  Procedure(s) Performed: TOTAL KNEE ARTHROPLASTY (Left Knee)  Patient location during evaluation: Nursing Unit Anesthesia Type: General Level of consciousness: awake and alert and patient cooperative Pain management: satisfactory to patient Vital Signs Assessment: post-procedure vital signs reviewed and stable Respiratory status: spontaneous breathing and patient connected to nasal cannula oxygen Cardiovascular status: stable Postop Assessment: no apparent nausea or vomiting Anesthetic complications: no     Last Vitals:  Vitals:   04/29/17 1200 04/29/17 1215  BP: (!) 110/55 (!) 154/65  Pulse: 78 70  Resp: 20 18  Temp:  36.6 C  SpO2: 98% 99%    Last Pain:  Vitals:   04/29/17 1200  TempSrc:   PainSc: 3                  Doratha Mcswain

## 2017-04-29 NOTE — NC FL2 (Signed)
[place] MEDICAID FL2 LEVEL OF CARE SCREENING TOOL     IDENTIFICATION  Patient Name: Patricia EmperorSylvia A Blodgett Birthdate: [birth year] Sex: female Admission Date (Current Location): 04/29/2017  [facility] [place]   Facility and Address:  [facility],  [address], [place] [id number]      Provider Number: [id number]  Attending Physician Name and Address:  Vickki HearingHarrison, Stanley E, MD  Relative Name and Phone Number:       Current Level of Care: Hospital Recommended Level of Care: Skilled Nursing Facility Prior Approval Number:    Date Approved/Denied:   PASRR Number:    Discharge Plan: SNF    Current Diagnoses: Patient Active Problem List   Diagnosis Date Noted  . Primary osteoarthritis of left knee 04/29/2017  . Primary osteoarthritis of both knees   . Abnormal CT scan, colon 01/18/2016  . Annual physical exam 01/02/2016  . Need for prophylactic vaccination and inoculation against influenza 07/04/2015  . Dyspnea   . Abnormal myocardial perfusion study   . Fatigue 07/18/2014  . Vitamin D deficiency 07/18/2014  . Exercise intolerance 07/18/2014  . Sleep disorder 07/18/2014  . Nonspecific abnormal electrocardiogram (ECG) (EKG) 07/18/2014  . Reduced vision 12/27/2013  . Radicular pain of thoracic region 06/22/2013  . Prediabetes 10/20/2012  . Metabolic syndrome X 10/20/2012  . BACK PAIN WITH RADICULOPATHY 09/20/2008  . Hyperlipidemia LDL goal <100 03/29/2008  . Morbid obesity (HCC) 03/29/2008  . Arthritis of left knee 03/29/2008  . Essential hypertension 10/07/2007    Orientation RESPIRATION BLADDER Height & Weight     Self, Time, Situation, Place  O2(2L) Continent Weight:   Height:     BEHAVIORAL SYMPTOMS/MOOD NEUROLOGICAL BOWEL NUTRITION STATUS      Continent Diet(Regular)  AMBULATORY STATUS COMMUNICATION OF NEEDS Skin   Limited Assist Verbally Normal                       Personal Care Assistance Level of Assistance   Bathing, Feeding, Dressing Bathing Assistance: Limited assistance Feeding assistance: Independent Dressing Assistance: Limited assistance     Functional Limitations Info  Sight, Hearing, Speech Sight Info: Adequate Hearing Info: Adequate Speech Info: Adequate    SPECIAL CARE FACTORS FREQUENCY  PT (By licensed PT)     PT Frequency: 5x/week              Contractures Contractures Info: Not present    Additional Factors Info  Code Status, Allergies Code Status Info: Full Code Allergies Info: Promethazine Hcl           Current Medications (04/29/2017):  This is the current hospital active medication list Current Facility-Administered Medications  Medication Dose Route Frequency Provider Last Rate Last Dose  . 0.9 %  sodium chloride infusion   Intravenous Continuous Vickki HearingHarrison, Stanley E, MD 100 mL/hr at 04/29/17 1222    . acetaminophen (TYLENOL) tablet 650 mg  650 mg Oral Q4H PRN Vickki HearingHarrison, Stanley E, MD       Or  . acetaminophen (TYLENOL) suppository 650 mg  650 mg Rectal Q4H PRN Vickki HearingHarrison, Stanley E, MD      . acetaminophen (TYLENOL) tablet 1,000 mg  1,000 mg Oral Q6H Vickki HearingHarrison, Stanley E, MD   1,000 mg at 04/29/17 1405  . alum & mag hydroxide-simeth (MAALOX/MYLANTA) 200-200-20 MG/5ML suspension 30 mL  30 mL Oral Q4H PRN Vickki HearingHarrison, Stanley E, MD      . amLODipine (NORVASC) tablet 5 mg  5 mg Oral QHS Romeo AppleHarrison,  Fernande BoydenStanley E, MD      . aspirin chewable tablet 81 mg  81 mg Oral BID Vickki HearingHarrison, Stanley E, MD      . bisacodyl (DULCOLAX) EC tablet 5 mg  5 mg Oral Daily PRN Vickki HearingHarrison, Stanley E, MD      . ceFAZolin (ANCEF) IVPB 1 g/50 mL premix  1 g Intravenous Q6H Vickki HearingHarrison, Stanley E, MD 100 mL/hr at 04/29/17 1423 1 g at 04/29/17 1423  . celecoxib (CELEBREX) capsule 200 mg  200 mg Oral Q12H Vickki HearingHarrison, Stanley E, MD      . cholecalciferol (VITAMIN D) tablet 1,000 Units  1,000 Units Oral Daily Vickki HearingHarrison, Stanley E, MD   1,000 Units at 04/29/17 1423  . [START ON 04/30/2017] dexamethasone  (DECADRON) injection 10 mg  10 mg Intravenous Once Vickki HearingHarrison, Stanley E, MD      . diphenhydrAMINE (BENADRYL) 12.5 MG/5ML elixir 12.5-25 mg  12.5-25 mg Oral Q4H PRN Vickki HearingHarrison, Stanley E, MD      . docusate sodium (COLACE) capsule 100 mg  100 mg Oral BID Vickki HearingHarrison, Stanley E, MD   100 mg at 04/29/17 1300  . gabapentin (NEURONTIN) capsule 300 mg  300 mg Oral TID Vickki HearingHarrison, Stanley E, MD   300 mg at 04/29/17 1425  . HYDROcodone-acetaminophen (NORCO) 10-325 MG per tablet 1 tablet  1 tablet Oral Q4H PRN Vickki HearingHarrison, Stanley E, MD      . HYDROcodone-acetaminophen [facility](NORCO) 7.5-325 MG per tablet 1 tablet  1 tablet Oral Q6H Vickki HearingHarrison, Stanley E, MD   1 tablet at 04/29/17 1300  . HYDROmorphone (DILAUDID) injection 0.5 mg  0.5 mg Intravenous Q2H PRN Vickki HearingHarrison, Stanley E, MD      . ketorolac (TORADOL) 15 MG/ML injection 7.5 mg  7.5 mg Intravenous Q6H Vickki HearingHarrison, Stanley E, MD   7.5 mg at 04/29/17 1406  . menthol-cetylpyridinium (CEPACOL) lozenge 3 mg  1 lozenge Oral PRN Vickki HearingHarrison, Stanley E, MD       Or  . phenol (CHLORASEPTIC) mouth spray 1 spray  1 spray Mouth/Throat PRN Vickki HearingHarrison, Stanley E, MD      . methocarbamol (ROBAXIN) tablet 500 mg  500 mg Oral Q6H PRN Vickki HearingHarrison, Stanley E, MD       Or  . methocarbamol (ROBAXIN) 500 mg in dextrose 5 % 50 mL IVPB  500 mg Intravenous Q6H PRN Vickki HearingHarrison, Stanley E, MD      . metoCLOPramide (REGLAN) tablet 5-10 mg  5-10 mg Oral Q8H PRN Vickki HearingHarrison, Stanley E, MD       Or  . metoCLOPramide (REGLAN) injection 5-10 mg  5-10 mg Intravenous Q8H PRN Vickki HearingHarrison, Stanley E, MD      . ondansetron [facility](ZOFRAN) tablet 4 mg  4 mg Oral Q6H PRN Vickki HearingHarrison, Stanley E, MD       Or  . ondansetron [facility](ZOFRAN) injection 4 mg  4 mg Intravenous Q6H PRN Vickki HearingHarrison, Stanley E, MD      . oxyCODONE (Oxy IR/ROXICODONE) immediate release tablet 10 mg  10 mg Oral Q3H PRN Vickki HearingHarrison, Stanley E, MD      . senna-docusate (Senokot-S) tablet 1 tablet  1 tablet Oral QHS PRN Vickki HearingHarrison, Stanley E, MD      . sodium phosphate (FLEET) 7-19  GM/118ML enema 1 enema  1 enema Rectal Once PRN Vickki HearingHarrison, Stanley E, MD      . tranexamic acid (CYKLOKAPRON) 1,000 mg in sodium chloride 0.9 % 100 mL IVPB  1,000 mg Intravenous Once Vickki HearingHarrison, Stanley E, MD      . triamterene-hydrochlorothiazide (MAXZIDE-25) 37.5-25 MG  per tablet 1 tablet  1 tablet Oral Daily Vickki Hearing, MD      . Melene Muller ON 04/30/2017] Vitamin D (Ergocalciferol) (DRISDOL) capsule 50,000 Units  50,000 Units Oral Q Wed Vickki Hearing, MD         Discharge Medications: Please see discharge summary for a list of discharge medications.  Relevant Imaging Results:  Relevant Lab Results:   Additional Information SSN [id number] Juleen China, LCSW

## 2017-04-29 NOTE — Brief Op Note (Addendum)
04/29/2017  10:23 AM  PATIENT:  Patricia Fuentes  71 y.o. female  PRE-OPERATIVE DIAGNOSIS:  primary Osteoarthritis left knee  POST-OPERATIVE DIAGNOSIS:  primary Osteoarthritis left knee  PROCEDURE:  Procedure(s): TOTAL KNEE ARTHROPLASTY (Left)   DEPUY  #2 femur #2.5 tibia Number 38 x 9 patella 10 mm PS polyethylene  SURGEON:  Surgeon(s) and Role:    Vickki Hearing* Tashi Andujo E, MD - Primary  PHYSICIAN ASSISTANT:   ASSISTANTS: Cecile Sheererynthia Wrenn  ANESTHESIA:   general  EBL:  125 mL   BLOOD ADMINISTERED:none  DRAINS: One Hemovac drain  LOCAL MEDICATIONS USED:  EXPAREL 20 DILUTED WITH 40CC AND 30 CC OF MARCAINE   SPECIMEN:  No Specimen  DISPOSITION OF SPECIMEN:  N/A  COUNTS:  YES  TOURNIQUET:   Total Tourniquet Time Documented: Thigh (Left) - 124 minutes Total: Thigh (Left) - 124 minutes   DICTATION: .Reubin Milanragon Dictation  PLAN OF CARE: Admit to inpatient   PATIENT DISPOSITION:  PACU - hemodynamically stable.   Delay start of Pharmacological VTE agent (>24hrs) due to surgical blood loss or risk of bleeding: not applicable

## 2017-04-29 NOTE — Transfer of Care (Signed)
Immediate Anesthesia Transfer of Care Note  Patient: Patricia Fuentes  Procedure(s) Performed: TOTAL KNEE ARTHROPLASTY (Left Knee)  Patient Location: PACU  Anesthesia Type:General  Level of Consciousness: awake, alert  and patient cooperative  Airway & Oxygen Therapy: Patient Spontanous Breathing and non-rebreather face mask  Post-op Assessment: Report given to RN and Post -op Vital signs reviewed and stable  Post vital signs: Reviewed and stable  Last Vitals:  Vitals:   04/29/17 0700 04/29/17 0705  BP: (!) 162/76   Resp: 18 20  Temp:    SpO2: 97% 97%    Last Pain:  Vitals:   04/29/17 0629  TempSrc: Oral      Patients Stated Pain Goal: 4 (04/29/17 0629)  Complications: No apparent anesthesia complications

## 2017-04-29 NOTE — Evaluation (Signed)
Physical Therapy Evaluation Patient Details Name: Patricia Fuentes MRN: [id number] DOB: 1946/05/01 Today's Date: 04/29/2017   History of Present Illness  Patricia Fuentes  is a 71 y/o female s/p Left TKA on 04/29/17 with the diagnosis of primary Osteoarthritis left knee    Clinical Impression  Patient  Slightly lethargic during treatment, mostly limited secondary to poor standing balance, fatigue, and desaturation to 84% with exertion while on room air, limited to a few side steps at bedside.  LEFT KNEE ROM: 0-60 degrees.  Patient will benefit from continued physical therapy in hospital and recommended venue below to increase strength, balance, endurance for safe ADLs and gait.    Follow Up Recommendations SNF    Equipment Recommendations  None recommended by PT    Recommendations for Other Services       Precautions / Restrictions Precautions Precautions: Fall Restrictions Weight Bearing Restrictions: Yes LLE Weight Bearing: Weight bearing as tolerated Other Position/Activity Restrictions: no pillows under left knee      Mobility  Bed Mobility Overal bed mobility: Needs Assistance Bed Mobility: Supine to Sit;Sit to Supine     Supine to sit: Min assist Sit to supine: Min assist   General bed mobility comments: demonstrates labored movement, requires assistance to move LLE back on to bed during sit to supine  Transfers Overall transfer level: Needs assistance Equipment used: Rolling walker (2 wheeled) Transfers: Sit to/from Stand Sit to Stand: Mod assist            Ambulation/Gait Ambulation/Gait assistance: Mod assist Ambulation Distance (Feet): 2 Feet Assistive device: Rolling walker (2 wheeled)       General Gait Details: Patient limited to 4-5 side steps at bedside due to c/o dizziness and O2 desaturation to 84% with exertion on room air, put back on 2 LPM, but O2 sats remained below 90%  Stairs            Wheelchair Mobility    Modified  Rankin (Stroke Patients Only)       Balance Overall balance assessment: Needs assistance Sitting-balance support: No upper extremity supported;Feet supported Sitting balance-Leahy Scale: Good     Standing balance support: Bilateral upper extremity supported;During functional activity Standing balance-Leahy Scale: Poor                               Pertinent Vitals/Pain Pain Assessment: 0-10 Pain Score: 3  Pain Location: left knee Pain Descriptors / Indicators: Aching;Discomfort Pain Intervention(s): Limited activity within patient's tolerance;Monitored during session;Premedicated before session    Home Living Family/patient expects to be discharged to:: Private residence Living Arrangements: Spouse/significant other Available Help at Discharge: Family Type of Home: House Home Access: Ramped entrance     Home Layout: One level Home Equipment: Cane - single point;Walker - 2 wheels      Prior Function Level of Independence: Independent with assistive device(s)         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, drives     Hand Dominance        Extremity/Trunk Assessment   Upper Extremity Assessment Upper Extremity Assessment: Defer to OT evaluation    Lower Extremity Assessment Lower Extremity Assessment: Generalized weakness;RLE deficits/detail;LLE deficits/detail RLE Deficits / Details: grossly 5/5 LLE Deficits / Details: LLE grossly -3/5    Cervical / Trunk Assessment Cervical / Trunk Assessment: Normal  Communication   Communication: No difficulties  Cognition Arousal/Alertness: Awake/alert Behavior During Therapy: WFL for tasks assessed/performed Overall Cognitive  Status: Within Functional Limits for tasks assessed                                        General Comments      Exercises General Exercises - Lower Extremity Ankle Circles/Pumps: Supine;AROM;Strengthening;Both;10 reps Quad Sets:  Supine;AROM;Strengthening;Left;10 reps Short Arc Quad: Supine;AAROM;Strengthening;Left;5 reps Heel Slides: Supine;AAROM;Strengthening;Left;5 reps   Assessment/Plan    PT Assessment Patient needs continued PT services  PT Problem List Decreased strength;Decreased range of motion;Decreased activity tolerance;Decreased mobility;Decreased balance       PT Treatment Interventions Gait training;Functional mobility training;Therapeutic activities;Therapeutic exercise;Patient/family education    PT Goals (Current goals can be found in the Care Plan section)  Acute Rehab PT Goals Patient Stated Goal: return home after rehab PT Goal Formulation: With patient/family Time For Goal Achievement: 05/06/17 Potential to Achieve Goals: Good    Frequency 7X/week   Barriers to discharge        Co-evaluation               AM-PAC PT "6 Clicks" Daily Activity  Outcome Measure Difficulty turning over in bed (including adjusting bedclothes, sheets and blankets)?: A Little Difficulty moving from lying on back to sitting on the side of the bed? : A Little Difficulty sitting down on and standing up from a chair with arms (e.g., wheelchair, bedside commode, etc,.)?: A Lot Help needed moving to and from a bed to chair (including a wheelchair)?: Total Help needed walking in hospital room?: A Lot Help needed climbing 3-5 steps with a railing? : Total 6 Click Score: 12    End of Session   Activity Tolerance: Patient tolerated treatment well;Patient limited by fatigue;Patient limited by lethargy;Patient limited by pain Patient left: in bed;with call bell/phone within reach;with family/visitor present Nurse Communication: Mobility status PT Visit Diagnosis: Unsteadiness on feet (R26.81);Other abnormalities of gait and mobility (R26.89);Muscle weakness (generalized) (M62.81)    Time: [id number] PT Time Calculation (min) (ACUTE ONLY): 38 min   Charges:   PT Evaluation $PT Eval Moderate Complexity:  1 Mod PT Treatments $Therapeutic Activity: 23-37 mins   PT G Codes:   PT G-Codes **NOT FOR INPATIENT CLASS** Functional Assessment Tool Used: AM-PAC 6 Clicks Basic Mobility Functional Limitation: Mobility: Walking and moving around Mobility: Walking and Moving Around Current Status ([id number](G8978): At least 60 percent but less than 80 percent impaired, limited or restricted Mobility: Walking and Moving Around Goal Status [phone](G8979): At least 60 percent but less than 80 percent impaired, limited or restricted Mobility: Walking and Moving Around Discharge Status [phone](G8980): At least 60 percent but less than 80 percent impaired, limited or restricted    3:23 PM, 04/29/17 Ocie BobJames Lutisha Knoche, MPT Physical Therapist with [facility] 336 [phone] office [phone] mobile phone

## 2017-04-29 NOTE — Plan of Care (Signed)
  Acute Rehab PT Goals(only PT should resolve) Pt Will Go Supine/Side To Sit 04/29/2017 1524 - Progressing by Ocie BobWatkins, Platon Arocho, PT Flowsheets Taken 04/29/2017 1524  Pt will go Supine/Side to Sit with modified independence Pt Will Go Sit To Supine/Side 04/29/2017 1524 - Progressing by Ocie BobWatkins, Jozlynn Plaia, PT Flowsheets Taken 04/29/2017 1524  Pt will go Sit to Supine/Side with modified independence Patient Will Transfer Sit To/From Stand 04/29/2017 1524 - Progressing by Ocie BobWatkins, Kamsiyochukwu Spickler, PT Flowsheets Taken 04/29/2017 1524  Patient will transfer sit to/from stand with supervision Pt Will Transfer Bed To Chair/Chair To Bed 04/29/2017 1524 - Progressing by Ocie BobWatkins, Demetries Coia, PT Flowsheets Taken 04/29/2017 1524  Pt will Transfer Bed to Chair/Chair to Bed with supervision Pt Will Ambulate 04/29/2017 1524 - Progressing by Ocie BobWatkins, Deitrick Ferreri, PT Flowsheets Taken 04/29/2017 1524  Pt will Ambulate with min guard assist;with rolling walker;75 feet  3:25 PM, 04/29/17 Ocie BobJames Burnice Vassel, MPT Physical Therapist with [facility] 336 [phone] office [phone] mobile phone

## 2017-04-29 NOTE — Clinical Social Work Note (Signed)
Clinical Social Work Assessment  Patient Details  Name: Patricia Fuentes MRN: [id number] Date of Birth: [birth year]  Date of referral:  04/29/17               Reason for consult:  Facility Placement                Permission sought to share information with:    Permission granted to share information::     Name::        Agency::     Relationship::     Contact Information:  Patient's spouse, Mr. Jenelle MagesHairston; sons: Vaughan BastaJermaine and Francee PiccoloDerek  Housing/Transportation Living arrangements for the past 2 months:  Single Family Home Source of Information:  Spouse, Adult Children Patient Interpreter Needed:  None Criminal Activity/Legal Involvement Pertinent to Current Situation/Hospitalization:  No - Comment as needed Significant Relationships:  Adult Children, Spouse Lives with:  Siblings Do you feel safe going back to the place where you live?  Yes Need for family participation in patient care:  Yes (Comment)  Care giving concerns:  None identified at baseline.    Social Worker assessment / plan:  Patient lives in the home with her spouse. At baseline, she ambulates with a cane. Patient is independent in her ADLs at baseline. She is also agreeable to short term SNF for rehab purposes.   Employment status:  Retired Database administratornsurance information:  Managed Medicare PT Recommendations:  Not assessed at this time Information / Referral to community resources:  Skilled Nursing Facility  Patient/Family's Response to care:  Patient and family are agreeable to short term SNF.   Patient/Family's Understanding of and Emotional Response to Diagnosis, Current Treatment, and Prognosis:  Patient and family are aware and understand patient's diagnosis, treatment and prognosis.   Emotional Assessment Appearance:  Appears stated age Attitude/Demeanor/Rapport:    Affect (typically observed):  Accepting Orientation:  Oriented to Self, Oriented to Place, Oriented to  Time, Oriented to Situation Alcohol / Substance  use:  Not Applicable Psych involvement (Current and /or in the community):  No (Comment)  Discharge Needs  Concerns to be addressed:  Discharge Planning Concerns Readmission within the last 30 days:  No Current discharge risk:  None Barriers to Discharge:  Insurance Authorization   Annice NeedySettle, Shemuel Harkleroad D, LCSW 04/29/2017, 3:23 PM

## 2017-04-29 NOTE — Interval H&P Note (Signed)
History and Physical Interval Note:  04/29/2017 7:17 AM  Patricia Fuentes  has presented today for surgery, with the diagnosis of primary Osteoarthritis left knee  The various methods of treatment have been discussed with the patient and family. After consideration of risks, benefits and other options for treatment, the patient has consented to  Procedure(s): TOTAL KNEE ARTHROPLASTY (Left) as a surgical intervention .  The patient's history has been reviewed, patient examined, no change in status, stable for surgery.  I have reviewed the patient's chart and labs.  Questions were answered to the patient's satisfaction.     Fuller CanadaStanley Destine Zirkle

## 2017-04-29 NOTE — Op Note (Addendum)
04/29/2017  10:23 AM  PATIENT:  Patricia Fuentes  71 y.o. female  PRE-OPERATIVE DIAGNOSIS:  primary Osteoarthritis left knee  POST-OPERATIVE DIAGNOSIS:  primary Osteoarthritis left knee  PROCEDURE:  Procedure(s): TOTAL KNEE ARTHROPLASTY (Left)   DEPUY  #2 femur #2.5 tibia Number 38 x 9 patella 10 mm PS polyethylene  SURGEON:  Surgeon(s) and Role:    Vickki Hearing* Harrison, Stanley E, MD - Primary  PHYSICIAN ASSISTANT:   ASSISTANTS: Cecile Sheererynthia Wrenn  ANESTHESIA:   general  EBL:  125 mL   BLOOD ADMINISTERED:none  DRAINS: One Hemovac drain  LOCAL MEDICATIONS USED:  EXPAREL 20 DILUTED WITH 40CC AND 30 CC OF MARCAINE   SPECIMEN:  No Specimen  DISPOSITION OF SPECIMEN:  N/A  COUNTS:  YES  TOURNIQUET:   Total Tourniquet Time Documented: Thigh (Left) - 124 minutes Total: Thigh (Left) - 124 minutes   DICTATION: .     Operative findings : Severe varus deformity.  Grade 4 chondral changes medial compartment, grade 3 changes lateral compartment, grade 2 changes patellofemoral compartment  Preop flexion contracture 10 degrees  Preop flexion under anesthesia 95 degrees   details of procedure:   The patient was identified in the preop holding area and the surgical site was confirmed as the left knee. Chart review and update were completed. The patient was taken to the operating room for general anesthesia. After successful anesthesia Foley catheter was inserted. The patient was placed supine on the operating table.   the left leg was prepped with DuraPrep and draped sterilely. Timeout was completed. The limb was then exsanguinated a  6 inch Esmarch. The tourniquet was elevated to 300 mmHg.   A midline incision was made and taken down to the extensor mechanism followed by medial arthrotomy. The patella was everted. A synovectomy was performed as needed. The osteophytes were resected.  Anterior cruciate ligament and PCL and medial and lateral meniscus were resected.   a 3/8 inch drill  bit was used to enter the femoral canal which was suctioned and irrigated until the fluid was clear. The distal femoral cut was set for 11 millimeter resection with a 5   Left Valgus angle. This cut was completed and checked for flatness.  The patella tendon was scarred to the proximal tibia and the initial patellar cut was made to debulk the patella.  Initial size was 25 mm thickness we resected down to 14 mm thickness.   the femur was then measured to a size 2.0.  The cutting block was placed to match the epicondyles and the 4 distal cuts were made using a 2.5 cutting block   the tibia was subluxated forward and the external alignment guide was placed. We removed 10 mm of bone from the higher lateral side. We set the guide for neutral varus valgus cut related to the  Mechanical axis of the tibia and for slope matching the patient's anatomy. Rotational alignment was set using the tibial tubercle, tibial spine and second metatarsal. The cutting block was pinned and the proximal tibia was resected.    spacer blocks were placed starting with a 10 mm insert.  Extension gap was good however the flexion gap was tight therefore the femur was downsized to a 2.0 using the 2.0 cutting block.  We then remeasured flexion extension gap with a 10 mm insert.  This gave stability in flexion and extension   We placed the femoral notch cutting guide size 2.0 and resected the notch.   Trial implants were placed using  appropriate size femur , appropriate size tibial baseplate which was measured after the proximal tibia resection. Tibial rotation was set patella tracking was normal   The tibia was then punched per manufacture technique making sure to avoid internal rotation.  Patella was punched using a 38 MM button  The patella tendon was stabilized using a 5.0 anchor.  Initial anchor of 6.5 was placed but broke and had to be removed by drilling around it and then removing it.  I then used a 3.2 drill bill to  insert a 5.0 anchor and applied to sutures to re-attached the medial half of the patellar tendon   Final range of motion check was performed with the appropriate size trials as mentioned above. Satisfactory reduction and motion were obtained.   Trial implants were removed. The bone was irrigated and dried and the cement was mixed on the back table  exparel was injected in the soft tissues and posterior capsule of the knee  These implants were then cemented in place. Excess cement was removed. The cement was allowed to cure. Second irrigation was performed.    FInal range of motion check and stability check was completed  The wound was irrigated third time Hemovac drain was placed, extensor mechanism was closed with #1 Nurolon followed by 0 Monocryl and staples to reapproximate the skin edges and subcutaneous tissue.   Sterile dressing was applied  The patient was taken recovery in stable condition   Postoperatively the patient will be ambulatory with full weightbearing in a knee immobilizer when she is walking to protect the patellar tendon.    PLAN OF CARE: Admit to inpatient   PATIENT DISPOSITION:  PACU - hemodynamically stable.   Delay start of Pharmacological VTE agent (>24hrs) due to surgical blood loss or risk of bleeding: not applicable

## 2017-04-29 NOTE — Progress Notes (Signed)
Patient has arrived on unit 300, room 337, and Dia CrawfordLisa Sanela Evola, RN has assumed the care of this patient.

## 2017-04-29 NOTE — Anesthesia Preprocedure Evaluation (Addendum)
Anesthesia Evaluation  Patient identified by MRN, date of birth, ID band Patient awake  General Assessment Comment:Spoke with Dr. Jerilynn MagesHarriston about the patient's use of goody's powder, Dr. Jerilynn MagesHarriston wishes to proceed with the surgery, declined the use of tranexamic acid.   Airway Mallampati: I  TM Distance: >3 FB     Dental  (+) Edentulous Lower, Edentulous Upper   Pulmonary shortness of breath,  overweight   Pulmonary exam normal        Cardiovascular Exercise Tolerance: Poor hypertension, Pt. on medications Normal cardiovascular exam Rhythm:Regular Rate:Normal  1. No angiographic evidence of CAD 2. Normal LV systolic function     (2016)  Normal sinus rhythm Normal ECG   (recent)   Neuro/Psych    GI/Hepatic   Endo/Other    Renal/GU      Musculoskeletal  (+) Arthritis , Takes multiple doses of Goody's powder/day  (500 mg Aspirin/dose)   Abdominal (+) + obese,   Peds  Hematology   Anesthesia Other Findings   Reproductive/Obstetrics                           Anesthesia Physical Anesthesia Plan  ASA: III  Anesthesia Plan: General   Post-op Pain Management:    Induction: Intravenous  PONV Risk Score and Plan:   Airway Management Planned: Oral ETT  Additional Equipment:   Intra-op Plan:   Post-operative Plan: Extubation in OR  Informed Consent:   Plan Discussed with: CRNA and Surgeon  Anesthesia Plan Comments:         Anesthesia Quick Evaluation

## 2017-04-30 ENCOUNTER — Encounter (HOSPITAL_COMMUNITY): Payer: Self-pay | Admitting: Orthopedic Surgery

## 2017-04-30 LAB — BASIC METABOLIC PANEL
ANION GAP: 6 (ref 5–15)
BUN: 22 mg/dL — ABNORMAL HIGH (ref 6–20)
CO2: 31 mmol/L (ref 22–32)
Calcium: 7.8 mg/dL — ABNORMAL LOW (ref 8.9–10.3)
Chloride: 101 mmol/L (ref 101–111)
Creatinine, Ser: 1.03 mg/dL — ABNORMAL HIGH (ref 0.44–1.00)
GFR calc Af Amer: >60 mL/min (ref >60)
GFR calc non Af Amer: 54 mL/min — ABNORMAL LOW (ref >60)
GLUCOSE: 127 mg/dL — AB (ref 65–99)
POTASSIUM: 4.9 mmol/L (ref 3.5–5.1)
Sodium: 138 mmol/L (ref 135–145)

## 2017-04-30 LAB — CBC
HEMATOCRIT: 30.9 % — AB (ref 36.0–46.0)
Hemoglobin: 9.2 g/dL — ABNORMAL LOW (ref 12.0–15.0)
MCH: 27.5 pg (ref 26.0–34.0)
MCHC: 29.8 g/dL — ABNORMAL LOW (ref 30.0–36.0)
MCV: 92.5 fL (ref 78.0–100.0)
Platelets: 203 10*3/uL (ref 150–400)
RBC: 3.34 MIL/uL — AB (ref 3.87–5.11)
RDW: 14.6 % (ref 11.5–15.5)
WBC: 14.4 10*3/uL — AB (ref 4.0–10.5)

## 2017-04-30 NOTE — Clinical Social Work Placement (Signed)
   CLINICAL SOCIAL WORK PLACEMENT  NOTE  Date:  04/30/2017  Patient Details  Name: Patricia EmperorSylvia A Rios MRN: [id number] Date of Birth: 1945-08-04  Clinical Social Work is seeking post-discharge placement for this patient at the Skilled  Nursing Facility level of care (*CSW will initial, date and re-position this form in  chart as items are completed):  Yes   Patient/family provided with [facility] Clinical Social Work Department's list of facilities offering this level of care within the geographic area requested by the patient (or if unable, by the patient's family).  Yes   Patient/family informed of their freedom to choose among providers that offer the needed level of care, that participate in Medicare, Medicaid or managed care program needed by the patient, have an available bed and are willing to accept the patient.  Yes   Patient/family informed of [facility] ownership interest [facility] and [facility], as well as of the fact that they are under no obligation to receive care at these facilities.  PASRR submitted to EDS on       PASRR number received on       Existing PASRR number confirmed on 04/29/17     FL2 transmitted to all facilities in geographic area requested by pt/family on 04/29/17     FL2 transmitted to all facilities within larger geographic area on       Patient informed that his/her managed care company has contracts with or will negotiate with certain facilities, including the following:        Yes   Patient/family informed of bed offers received.  [facility]     Physician recommends and patient chooses bed at      [facility] on  .  Patient to be transferred to facility by       Patient family notified on   of transfer.  Name of family member notified:        PHYSICIAN       Additional Comment:    _______________________________________________ Annice NeedySettle, Nicle Connole D,  LCSW 04/30/2017, 4:35 PM

## 2017-04-30 NOTE — Addendum Note (Signed)
Addendum  created 04/30/17 0809 by Earleen NewportAdams, Nori Winegar A, CRNA   Sign clinical note

## 2017-04-30 NOTE — Progress Notes (Signed)
Physical Therapy Treatment Patient Details Name: Patricia Fuentes MRN: [id number] DOB: December 14, 1945 Today's Date: 04/30/2017    History of Present Illness Patricia Fuentes  is a [age] y/o female s/p Left TKA on 04/29/17 with the diagnosis of primary Osteoarthritis left knee      PT Comments    Patient tolerated staying up in chair for approximately 3 hours with less c/o of nausea, demonstrates increased BLE strength for sit to stands, limited to a few side steps to transfer back to bed with c/o increasing left knee pain with weight bearing.  LEFT KNEE PROM: 0-66 DEGREES; CPM: -5 TO 60 DEGREES.  Patient will benefit from continued physical therapy in hospital and recommended venue below to increase strength, balance, endurance for safe ADLs and gait.    Follow Up Recommendations  SNF     Equipment Recommendations  None recommended by PT    Recommendations for Other Services       Precautions / Restrictions Precautions Precautions: Fall Restrictions Weight Bearing Restrictions: Yes LLE Weight Bearing: Weight bearing as tolerated Other Position/Activity Restrictions: no pillows under left knee    Mobility  Bed Mobility Overal bed mobility: Needs Assistance Bed Mobility: Supine to Sit     Supine to sit: Min assist;Mod assist Sit to supine: Min assist   General bed mobility comments: required assistance to move LLE back onto bed  Transfers Overall transfer level: Needs assistance Equipment used: Rolling walker (2 wheeled) Transfers: Sit to/from [employer];Stand Pivot Transfers Sit to Stand: Min assist Stand pivot transfers: Min assist       General transfer comment: demonstrates improvement for sit to stands from chair  Ambulation/Gait Ambulation/Gait assistance: Mod assist Ambulation Distance (Feet): 3 Feet Assistive device: Rolling walker (2 wheeled) Gait Pattern/deviations: Decreased step length - left;Decreased stance time - left;Decreased stride length Gait velocity:  slow Gait velocity interpretation: Below normal speed for age/gender General Gait Details: Patient able to take 5-6 side steps to transfer back to bedside   Stairs            Wheelchair Mobility    Modified Rankin (Stroke Patients Only)       Balance Overall balance assessment: Needs assistance Sitting-balance support: Feet supported;No upper extremity supported Sitting balance-Leahy Scale: Good     Standing balance support: Bilateral upper extremity supported;During functional activity Standing balance-Leahy Scale: Fair Standing balance comment: demonstrates slight improvement in standing balance using RW                            Cognition Arousal/Alertness: Awake/alert Behavior During Therapy: WFL for tasks assessed/performed Overall Cognitive Status: Within Functional Limits for tasks assessed                                        Exercises Total Joint Exercises Goniometric ROM: LEFT KNEE ROM: 0-66 DEGREES General Exercises - Lower Extremity Ankle Circles/Pumps: Supine;AROM;Strengthening;Both;10 reps Quad Sets: Supine;AROM;Strengthening;Left;10 reps Short Arc Quad: Supine;AAROM;Strengthening;Left;10 reps Heel Slides: Supine;AAROM;Strengthening;Left;10 reps    General Comments        Pertinent Vitals/Pain Pain Assessment: 0-10 Pain Score: 4  Pain Location: left knee that increases to 7-8/10 with movement Pain Descriptors / Indicators: Aching;Discomfort;Sharp Pain Intervention(s): Limited activity within patient's tolerance;Monitored during session    Home Living  Prior Function            PT Goals (current goals can now be found in the care plan section) Acute Rehab PT Goals Patient Stated Goal: return home after rehab PT Goal Formulation: With patient Time For Goal Achievement: 05/06/17 Potential to Achieve Goals: Good Progress towards PT goals: Progressing toward goals     Frequency    7X/week      PT Plan Current plan remains appropriate    Co-evaluation              AM-PAC PT "6 Clicks" Daily Activity  Outcome Measure  Difficulty turning over in bed (including adjusting bedclothes, sheets and blankets)?: A Little Difficulty moving from lying on back to sitting on the side of the bed? : A Little Difficulty sitting down on and standing up from a chair with arms (e.g., wheelchair, bedside commode, etc,.)?: A Lot Help needed moving to and from a bed to chair (including a wheelchair)?: A Lot Help needed walking in hospital room?: A Lot Help needed climbing 3-5 steps with a railing? : Total 6 Click Score: 13    End of Session   Activity Tolerance: Patient limited by fatigue;Patient limited by pain Patient left: in bed;in CPM Nurse Communication: Mobility status PT Visit Diagnosis: Unsteadiness on feet (R26.81);Other abnormalities of gait and mobility (R26.89);Muscle weakness (generalized) (M62.81)     Time: [id number] PT Time Calculation (min) (ACUTE ONLY): 18 min  Charges:  $Therapeutic Exercise: 8-22 mins $Therapeutic Activity: 8-22 mins                    G Codes:  Functional Assessment Tool Used: AM-PAC 6 Clicks Basic Mobility Functional Limitation: Mobility: Walking and moving around Mobility: Walking and Moving Around Current Status ([id number](G8978): At least 40 percent but less than 60 percent impaired, limited or restricted Mobility: Walking and Moving Around Goal Status [phone](G8979): At least 40 percent but less than 60 percent impaired, limited or restricted Mobility: Walking and Moving Around Discharge Status [phone](G8980): At least 40 percent but less than 60 percent impaired, limited or restricted    2:21 PM, 04/30/17 Ocie BobJames Locklyn Henriquez, MPT Physical Therapist [facility] [phone] office [phone] mobile phone

## 2017-04-30 NOTE — Anesthesia Postprocedure Evaluation (Signed)
Anesthesia Post Note  Patient: Patricia Fuentes  Procedure(s) Performed: TOTAL KNEE ARTHROPLASTY (Left Knee)  Patient location during evaluation: Nursing Unit Anesthesia Type: General Level of consciousness: awake and alert and oriented Pain management: pain level controlled Vital Signs Assessment: post-procedure vital signs reviewed and stable Respiratory status: respiratory function stable and patient connected to nasal cannula oxygen Cardiovascular status: stable : Some nausea last night relieved with Zofran. Anesthetic complications: no     Last Vitals:  Vitals:   04/30/17 0030 04/30/17 0430  BP: 135/66 (!) 126/54  Pulse: 72 72  Resp: 18 18  Temp: 36.6 C 36.7 C  SpO2: 96% 99%    Last Pain:  Vitals:   04/30/17 0430  TempSrc: Oral  PainSc:                  [name] A

## 2017-04-30 NOTE — Progress Notes (Signed)
Physical Therapy Treatment Patient Details Name: Patricia EmperorSylvia A Bianchini MRN: [id number] DOB: 12-Jan-1946 Today's Date: 04/30/2017    History of Present Illness Patricia Fuentes  is a 71 y/o female s/p Left TKA on 04/29/17 with the diagnosis of primary Osteoarthritis left knee      PT Comments    Patient demonstrates increased tolerance for taking side steps at bed side, limited due to LLE weakness and increased knee pain.  Patient tolerated sitting up in chair with LLE elevated after therapy - RN notified.  Patient will benefit from continued physical therapy in hospital and recommended venue below to increase strength, balance, endurance for safe ADLs and gait.    Follow Up Recommendations  SNF     Equipment Recommendations  None recommended by PT    Recommendations for Other Services       Precautions / Restrictions Precautions Precautions: Fall Restrictions Weight Bearing Restrictions: Yes LLE Weight Bearing: Weight bearing as tolerated Other Position/Activity Restrictions: no pillows under left knee    Mobility  Bed Mobility Overal bed mobility: Needs Assistance Bed Mobility: Supine to Sit     Supine to sit: Min assist;Mod assist Sit to supine: Mod assist   General bed mobility comments: +2 assist to scoot to head of bed with pt helping via bending right knee and pushing up  Transfers Overall transfer level: Needs assistance Equipment used: Rolling walker (2 wheeled) Transfers: Sit to/from [employer];Stand Pivot Transfers Sit to Stand: Mod assist;Max assist Stand pivot transfers: Mod assist       General transfer comment: has dificulty with sit to stands due to LLE weakness and increased knee pain  Ambulation/Gait Ambulation/Gait assistance: Mod assist Ambulation Distance (Feet): 3 Feet Assistive device: Rolling walker (2 wheeled) Gait Pattern/deviations: Decreased step length - left;Decreased stance time - left;Decreased stride length   Gait velocity  interpretation: Below normal speed for age/gender General Gait Details: Patient limited to 8-9 unsteady short side steps at bedside and during transfer to chair, unable to take steps away from bed due to LLE weakness/severe pain   Stairs            Wheelchair Mobility    Modified Rankin (Stroke Patients Only)       Balance Overall balance assessment: Needs assistance Sitting-balance support: Feet supported;No upper extremity supported Sitting balance-Leahy Scale: Good     Standing balance support: Bilateral upper extremity supported;During functional activity Standing balance-Leahy Scale: Poor Standing balance comment: fair/poor with RW                             Cognition Arousal/Alertness: Awake/alert Behavior During Therapy: WFL for tasks assessed/performed Overall Cognitive Status: Within Functional Limits for tasks assessed                                        Exercises Total Joint Exercises Goniometric ROM: LEFT KNEE ROM: 0-66 DEGREES General Exercises - Lower Extremity Ankle Circles/Pumps: Supine;AROM;Strengthening;Both;10 reps Quad Sets: Supine;AROM;Strengthening;Left;10 reps Short Arc Quad: Supine;AAROM;Strengthening;Left;10 reps Heel Slides: Supine;AAROM;Strengthening;Left;10 reps    General Comments        Pertinent Vitals/Pain Pain Assessment: 0-10 Pain Score: 3  Pain Location: left knee that increases to 7-8/10 with movement Pain Descriptors / Indicators: Aching;Discomfort Pain Intervention(s): Limited activity within patient's tolerance;Monitored during session;Patient requesting pain meds-RN notified    Home Living   Living Arrangements: Spouse/significant  other Available Help at Discharge: Family Type of Home: House Home Access: Ramped entrance   Home Layout: One level Home Equipment: Cane - single point;Walker - 2 wheels      Prior Function Level of Independence: Independent with assistive device(s)       Comments: Community ambulator with SPC, drives   PT Goals (current goals can now be found in the care plan section) Acute Rehab PT Goals Patient Stated Goal: return home after rehab PT Goal Formulation: With patient/family Time For Goal Achievement: 05/06/17 Potential to Achieve Goals: Good Progress towards PT goals: Progressing toward goals    Frequency    7X/week      PT Plan Current plan remains appropriate    Co-evaluation              AM-PAC PT "6 Clicks" Daily Activity  Outcome Measure  Difficulty turning over in bed (including adjusting bedclothes, sheets and blankets)?: A Little Difficulty moving from lying on back to sitting on the side of the bed? : A Little Difficulty sitting down on and standing up from a chair with arms (e.g., wheelchair, bedside commode, etc,.)?: A Lot Help needed moving to and from a bed to chair (including a wheelchair)?: A Lot Help needed walking in hospital room?: A Lot Help needed climbing 3-5 steps with a railing? : Total 6 Click Score: 13    End of Session   Activity Tolerance: Patient limited by fatigue;Patient limited by pain Patient left: in chair;with call bell/phone within reach Nurse Communication: Mobility status PT Visit Diagnosis: Unsteadiness on feet (R26.81);Other abnormalities of gait and mobility (R26.89);Muscle weakness (generalized) (M62.81)     Time: [id number] PT Time Calculation (min) (ACUTE ONLY): 32 min  Charges:  $Therapeutic Exercise: 8-22 mins $Therapeutic Activity: 8-22 mins                    G Codes:  Functional Assessment Tool Used: AM-PAC 6 Clicks Basic Mobility Functional Limitation: Mobility: Walking and moving around Mobility: Walking and Moving Around Current Status (V4098(G8978): At least 40 percent but less than 60 percent impaired, limited or restricted Mobility: Walking and Moving Around Goal Status [phone](G8979): At least 40 percent but less than 60 percent impaired, limited or  restricted Mobility: Walking and Moving Around Discharge Status [phone](G8980): At least 40 percent but less than 60 percent impaired, limited or restricted    11:20 AM, 04/30/17 Ocie BobJames Kross Swallows, MPT Physical Therapist [facility] 336 [phone] office [phone] mobile phone

## 2017-04-30 NOTE — Progress Notes (Signed)
Patient ID: Patricia Fuentes, female   DOB: 09-Feb-1946, 71 y.o.   MRN: [id number] .Postoperative note  Postoperative day number #1  Status post left total knee  Vital signs  BP (!) 126/54 (BP Location: Right Arm)   Pulse 72   Temp 98 F (36.7 C) (Oral)   Resp 18   SpO2 99%    Pertinent labs   CBC Latest Ref Rng & Units 04/30/2017 04/25/2017 02/11/2017  WBC 4.0 - 10.5 K/uL 14.4(H) 5.9 6.0  Hemoglobin 12.0 - 15.0 g/dL 3.4(V9.2(L) 11.7(L) 11.6(L)  Hematocrit 36.0 - 46.0 % 30.9(L) 39.0 35.7  Platelets 150 - 400 K/uL 203 220 197   BMP Latest Ref Rng & Units 04/30/2017 04/25/2017 02/11/2017  Glucose 65 - 99 mg/dL 425(Z127(H) 563(O102(H) 77  BUN 6 - 20 mg/dL 75(I22(H) 18 19  Creatinine 0.44 - 1.00 mg/dL 4.33(I1.03(H) 9.510.85 8.84(Z1.03(H)  BUN/Creat Ratio 6 - 22 (calc) - - 18  Sodium 135 - 145 mmol/L 138 136 139  Potassium 3.5 - 5.1 mmol/L 4.9 4.0 3.8  Chloride 101 - 111 mmol/L 101 94(L) 99  CO2 22 - 32 mmol/L 31 33(H) 30  Calcium 8.9 - 10.3 mg/dL 7.8(L) 8.9 8.8     Patient complaints pain controlled  Physical exam neurovascular exam normal  Assessment and plan   Stable postop start physical therapy needs knee immobilizer when ambulating to protect patellar tendon

## 2017-04-30 NOTE — Evaluation (Signed)
Occupational Therapy Evaluation Patient Details Name: Patricia EmperorSylvia A Kadar MRN: [id number] DOB: 11/07/1945 Today's Date: 04/30/2017    History of Present Illness Patricia EmperorSylvia A Kirsch  is a 71 y/o female s/p Left TKA on 04/29/17 with the diagnosis of primary Osteoarthritis left knee     Clinical Impression   Pt received supine in bed, agreeable to OT evaluation. Pt requiring increased level of assistance for LB ADL completion, and is unable to complete any ADLs in standing. During evaluation pt became nauseous upon sitting and was unable to complete mobility tasks. RN in room and aware. Recommend SNF on discharge to improve safety and independence in B/ADL completion and regain strength and improve activity tolerance.     Follow Up Recommendations  SNF    Equipment Recommendations  None recommended by OT       Precautions / Restrictions Precautions Precautions: Fall Restrictions Weight Bearing Restrictions: No LLE Weight Bearing: Weight bearing as tolerated Other Position/Activity Restrictions: no pillows under left knee      Mobility Bed Mobility Overal bed mobility: Needs Assistance Bed Mobility: Supine to Sit;Sit to Supine     Supine to sit: Min assist Sit to supine: Mod assist   General bed mobility comments: +2 assist to scoot to head of bed with pt helping via bending right knee and pushing up  Transfers Overall transfer level: Needs assistance Equipment used: Rolling walker (2 wheeled) Transfers: Sit to/from Stand Sit to Stand: Mod assist                  ADL either performed or assessed with clinical judgement   ADL Overall ADL's : Needs assistance/impaired                                       General ADL Comments: Pt requiring increased assistance for B/ADLs due to pain and weakness limiting independence with completion     Vision Baseline Vision/History: No visual deficits Patient Visual Report: No change from baseline Vision  Assessment?: No apparent visual deficits            Pertinent Vitals/Pain Pain Assessment: 0-10 Pain Score: 3  Pain Location: left knee Pain Descriptors / Indicators: Aching;Discomfort Pain Intervention(s): Limited activity within patient's tolerance;Monitored during session;Repositioned     Hand Dominance Right   Extremity/Trunk Assessment Upper Extremity Assessment Upper Extremity Assessment: Overall WFL for tasks assessed   Lower Extremity Assessment Lower Extremity Assessment: Defer to PT evaluation   Cervical / Trunk Assessment Cervical / Trunk Assessment: Normal   Communication Communication Communication: No difficulties   Cognition Arousal/Alertness: Awake/alert Behavior During Therapy: WFL for tasks assessed/performed Overall Cognitive Status: Within Functional Limits for tasks assessed                                                Home Living   Living Arrangements: Spouse/significant other Available Help at Discharge: Family Type of Home: House Home Access: Ramped entrance     Home Layout: One level     Bathroom Shower/Tub: Tub/shower unit         Home Equipment: Cane - single point;Walker - 2 wheels          Prior Functioning/Environment Level of Independence: Independent with assistive device(s)  Comments: Community ambulator with SPC, drives        OT Problem List: Decreased activity tolerance;Impaired balance (sitting and/or standing);Decreased safety awareness;Decreased knowledge of use of DME or AE;Pain          End of Session Equipment Utilized During Treatment: Rolling walker;Gait belt  Activity Tolerance: Other (comment)(Pt limited by nausea) Patient left: in bed;with call bell/phone within reach;with nursing/sitter in room  OT Visit Diagnosis: Muscle weakness (generalized) (M62.81);Pain Pain - Right/Left: Left Pain - part of body: Knee                Time: [id number] OT Time Calculation (min):  30 min Charges:  OT General Charges $OT Visit: 1 Visit OT Evaluation $OT Eval Low Complexity: 1 Low   Ezra SitesLeslie Jahmai Finelli, OTR/L  [phone] 04/30/2017, 9:28 AM

## 2017-05-01 LAB — CBC
HCT: 25.8 % — ABNORMAL LOW (ref 36.0–46.0)
HEMOGLOBIN: 7.8 g/dL — AB (ref 12.0–15.0)
MCH: 27.6 pg (ref 26.0–34.0)
MCHC: 30.2 g/dL (ref 30.0–36.0)
MCV: 91.2 fL (ref 78.0–100.0)
PLATELETS: 192 10*3/uL (ref 150–400)
RBC: 2.83 MIL/uL — AB (ref 3.87–5.11)
RDW: 14.7 % (ref 11.5–15.5)
WBC: 16.9 10*3/uL — AB (ref 4.0–10.5)

## 2017-05-01 LAB — BPAM RBC
Blood Product Expiration Date: 201812232359
Blood Product Expiration Date: 201812242359
Unit Type and Rh: 5100
Unit Type and Rh: 5100

## 2017-05-01 LAB — TYPE AND SCREEN
ABO/RH(D): AB POS
ANTIBODY SCREEN: NEGATIVE
UNIT DIVISION: 0
Unit division: 0

## 2017-05-01 NOTE — Progress Notes (Signed)
Physical Therapy Treatment Patient Details Name: Patricia Fuentes MRN: [id number] DOB: 03/08/1946 Today's Date: 05/01/2017    History of Present Illness Patricia Fuentes  is a 71 y/o female s/p Left TKA on 04/29/17 with the diagnosis of primary Osteoarthritis left knee      PT Comments    Patient limited for left knee flexion due to increased pain at end range, required mod/max assist to don/doff left knee immobilizer (patient assisted pulling it up towards groin area), demonstrates increased LLE strength for taking steps at bedside, but limited due to fatigue and left knee pain.  LEFT KNEE ROM: 0-40 degrees (self knee flexion sitting at bedside held due to patient wearing immobilizer).  Patient will benefit from continued physical therapy in hospital and recommended venue below to increase strength, balance, endurance for safe ADLs and gait.    Follow Up Recommendations  SNF     Equipment Recommendations  None recommended by PT    Recommendations for Other Services       Precautions / Restrictions Precautions Precautions: Fall Required Braces or Orthoses: Knee Immobilizer - Left Restrictions Weight Bearing Restrictions: Yes LLE Weight Bearing: Weight bearing as tolerated Other Position/Activity Restrictions: no pillows under left knee, wear knee immobilizer LLE to protect patella tendon (see ortho notes 04/30/17)    Mobility  Bed Mobility Overal bed mobility: Needs Assistance Bed Mobility: Supine to Sit       Sit to supine: Min assist   General bed mobility comments: requires help to move LLE  Transfers Overall transfer level: Needs assistance Equipment used: Rolling walker (2 wheeled) Transfers: Sit to/from [employer];Stand Pivot Transfers Sit to Stand: Min assist   Squat pivot transfers: Min assist     General transfer comment: [facility] and [facility] due to using armrest  Ambulation/Gait Ambulation/Gait assistance: Min assist Ambulation Distance  (Feet): 8 Feet Assistive device: Rolling walker (2 wheeled) Gait Pattern/deviations: Decreased step length - left;Decreased stance time - left;Decreased stride length Gait velocity: slow Gait velocity interpretation: Below normal speed for age/gender General Gait Details: demonstrates slow labored cadence with short unsteady steps forward/backwards without loss of balance, limited secondary to c/o fatigue, left knee pain (knee immobilizer donned LLE prior to sitting up)   Stairs            Wheelchair Mobility    Modified Rankin (Stroke Patients Only)       Balance Overall balance assessment: Needs assistance Sitting-balance support: Feet supported;No upper extremity supported Sitting balance-Leahy Scale: Good     Standing balance support: Bilateral upper extremity supported;During functional activity Standing balance-Leahy Scale: Fair                              Cognition Arousal/Alertness: Awake/alert Behavior During Therapy: WFL for tasks assessed/performed Overall Cognitive Status: Within Functional Limits for tasks assessed                                        Exercises Total Joint Exercises Goniometric ROM: LEFT KNEE ROM: 0-40 degrees (measured while patient supine in bed - limited due to increased pain) General Exercises - Lower Extremity Ankle Circles/Pumps: Supine;AROM;Strengthening;Both;10 reps Quad Sets: Supine;AROM;Strengthening;Left;10 reps Short Arc Quad: Supine;AAROM;Strengthening;Left;10 reps Heel Slides: Supine;AAROM;Strengthening;Left;10 reps    General Comments        Pertinent Vitals/Pain Pain Score: 6  Pain Location: left  knee that increases to 7-8/10 with movement Pain Descriptors / Indicators: Aching;Discomfort;Sharp Pain Intervention(s): Limited activity within patient's tolerance;Monitored during session    Home Living                      Prior Function            PT Goals (current goals  can now be found in the care plan section) Acute Rehab PT Goals Patient Stated Goal: return home after rehab PT Goal Formulation: With patient Time For Goal Achievement: 05/06/17 Potential to Achieve Goals: Good Progress towards PT goals: Progressing toward goals    Frequency    7X/week      PT Plan Current plan remains appropriate    Co-evaluation              AM-PAC PT "6 Clicks" Daily Activity  Outcome Measure  Difficulty turning over in bed (including adjusting bedclothes, sheets and blankets)?: A Little Difficulty moving from lying on back to sitting on the side of the bed? : A Little Difficulty sitting down on and standing up from a chair with arms (e.g., wheelchair, bedside commode, etc,.)?: A Little Help needed moving to and from a bed to chair (including a wheelchair)?: A Little Help needed walking in hospital room?: A Lot Help needed climbing 3-5 steps with a railing? : Total 6 Click Score: 15    End of Session   Activity Tolerance: Patient tolerated treatment well;Patient limited by fatigue;Patient limited by pain Patient left: in chair;with call bell/phone within reach;with family/visitor present Nurse Communication: Mobility status PT Visit Diagnosis: Unsteadiness on feet (R26.81);Other abnormalities of gait and mobility (R26.89);Muscle weakness (generalized) (M62.81)     Time: [id number] PT Time Calculation (min) (ACUTE ONLY): 32 min  Charges:  $Therapeutic Exercise: 8-22 mins $Therapeutic Activity: 8-22 mins                    G Codes:  Functional Assessment Tool Used: AM-PAC 6 Clicks Basic Mobility Functional Limitation: Mobility: Walking and moving around Mobility: Walking and Moving Around Current Status (V4098(G8978): At least 40 percent but less than 60 percent impaired, limited or restricted Mobility: Walking and Moving Around Goal Status [phone](G8979): At least 40 percent but less than 60 percent impaired, limited or restricted Mobility: Walking and  Moving Around Discharge Status [phone](G8980): At least 40 percent but less than 60 percent impaired, limited or restricted    2:01 PM, 05/01/17 Patricia Fuentes, MPT Physical Therapist [facility] 336 [phone] office [phone] mobile phone

## 2017-05-02 ENCOUNTER — Inpatient Hospital Stay
Admission: RE | Admit: 2017-05-02 | Discharge: 2017-05-18 | Disposition: A | Discharge status: Home / Self Care | Payer: Medicare HMO | Source: Ambulatory Visit | Attending: Internal Medicine | Admitting: Internal Medicine

## 2017-05-02 ENCOUNTER — Non-Acute Institutional Stay (SKILLED_NURSING_FACILITY): Payer: Medicare HMO | Admitting: Internal Medicine

## 2017-05-02 ENCOUNTER — Encounter: Payer: Self-pay | Admitting: Internal Medicine

## 2017-05-02 DIAGNOSIS — E669 Obesity, unspecified: Secondary | ICD-10-CM | POA: Diagnosis not present

## 2017-05-02 DIAGNOSIS — K5900 Constipation, unspecified: Secondary | ICD-10-CM

## 2017-05-02 DIAGNOSIS — D649 Anemia, unspecified: Secondary | ICD-10-CM | POA: Diagnosis not present

## 2017-05-02 DIAGNOSIS — Z96652 Presence of left artificial knee joint: Secondary | ICD-10-CM

## 2017-05-02 DIAGNOSIS — R110 Nausea: Secondary | ICD-10-CM

## 2017-05-02 DIAGNOSIS — Z96659 Presence of unspecified artificial knee joint: Secondary | ICD-10-CM | POA: Diagnosis not present

## 2017-05-02 DIAGNOSIS — I10 Essential (primary) hypertension: Secondary | ICD-10-CM | POA: Diagnosis not present

## 2017-05-02 DIAGNOSIS — Z96661 Presence of right artificial ankle joint: Secondary | ICD-10-CM | POA: Diagnosis not present

## 2017-05-02 DIAGNOSIS — N289 Disorder of kidney and ureter, unspecified: Secondary | ICD-10-CM | POA: Diagnosis not present

## 2017-05-02 DIAGNOSIS — M6281 Muscle weakness (generalized): Secondary | ICD-10-CM | POA: Diagnosis not present

## 2017-05-02 DIAGNOSIS — Z471 Aftercare following joint replacement surgery: Secondary | ICD-10-CM | POA: Diagnosis not present

## 2017-05-02 DIAGNOSIS — R748 Abnormal levels of other serum enzymes: Secondary | ICD-10-CM | POA: Diagnosis not present

## 2017-05-02 DIAGNOSIS — M1990 Unspecified osteoarthritis, unspecified site: Secondary | ICD-10-CM | POA: Diagnosis not present

## 2017-05-02 DIAGNOSIS — M1712 Unilateral primary osteoarthritis, left knee: Secondary | ICD-10-CM | POA: Diagnosis not present

## 2017-05-02 DIAGNOSIS — R279 Unspecified lack of coordination: Secondary | ICD-10-CM | POA: Diagnosis not present

## 2017-05-02 DIAGNOSIS — R262 Difficulty in walking, not elsewhere classified: Secondary | ICD-10-CM | POA: Diagnosis not present

## 2017-05-02 DIAGNOSIS — D62 Acute posthemorrhagic anemia: Secondary | ICD-10-CM

## 2017-05-02 DIAGNOSIS — Z4789 Encounter for other orthopedic aftercare: Secondary | ICD-10-CM | POA: Diagnosis not present

## 2017-05-02 DIAGNOSIS — E785 Hyperlipidemia, unspecified: Secondary | ICD-10-CM | POA: Diagnosis not present

## 2017-05-02 LAB — CBC
HCT: 24.2 % — ABNORMAL LOW (ref 36.0–46.0)
Hemoglobin: 7.1 g/dL — ABNORMAL LOW (ref 12.0–15.0)
MCH: 27 pg (ref 26.0–34.0)
MCHC: 29.3 g/dL — ABNORMAL LOW (ref 30.0–36.0)
MCV: 92 fL (ref 78.0–100.0)
PLATELETS: 191 10*3/uL (ref 150–400)
RBC: 2.63 MIL/uL — AB (ref 3.87–5.11)
RDW: 14.6 % (ref 11.5–15.5)
WBC: 13.1 10*3/uL — ABNORMAL HIGH (ref 4.0–10.5)

## 2017-05-02 MED ORDER — SENNOSIDES-DOCUSATE SODIUM 8.6-50 MG PO TABS: 1.0000 | ORAL_TABLET | Freq: Every evening | ORAL | 1 refills | Status: DC | PRN

## 2017-05-02 MED ORDER — HYDROCODONE-ACETAMINOPHEN 7.5-325 MG PO TABS: 1.0000 | ORAL_TABLET | ORAL | 0 refills | Status: DC | PRN

## 2017-05-02 NOTE — Discharge Summary (Signed)
Physician Discharge Summary  Patient ID: Patricia Fuentes MRN: [id number] DOB/AGE: Feb 14, 1946 71 y.o.  Admit date: 04/29/2017 Discharge date: 05/02/2017  Admission Diagnoses: Osteoarthritis left knee  Discharge Diagnoses: Osteoarthritis left knee Status post left total knee History of right total knee  Discharged Condition: good  Hospital Course: Stable hospital course.  The patient was admitted on 4 December had uncomplicated left total knee arthroplasty with patellar tendon reinforcement. She had general and spinal anesthetic.  She progressed in the hospital with physical therapy.  She requires knee immobilizer for ambulation to protect the patellar tendon.  CBC Latest Ref Rng & Units 05/02/2017 05/01/2017 04/30/2017  WBC 4.0 - 10.5 K/uL 13.1(H) 16.9(H) 14.4(H)  Hemoglobin 12.0 - 15.0 g/dL 7.1(L) 7.8(L) 9.2(L)  Hematocrit 36.0 - 46.0 % 24.2(L) 25.8(L) 30.9(L)  Platelets 150 - 400 K/uL 191 192 203   BMP Latest Ref Rng & Units 04/30/2017 04/25/2017 02/11/2017  Glucose 65 - 99 mg/dL 562(Z127(H) 308(M102(H) 77  BUN 6 - 20 mg/dL 57(Q22(H) 18 19  Creatinine 0.44 - 1.00 mg/dL 4.69(G1.03(H) 2.950.85 2.84(X1.03(H)  BUN/Creat Ratio 6 - 22 (calc) - - 18  Sodium 135 - 145 mmol/L 138 136 139  Potassium 3.5 - 5.1 mmol/L 4.9 4.0 3.8  Chloride 101 - 111 mmol/L 101 94(L) 99  CO2 22 - 32 mmol/L 31 33(H) 30  Calcium 8.9 - 10.3 mg/dL 7.8(L) 8.9 8.8    Discharge Exam: Blood pressure (!) 117/51, pulse 90, temperature 99.1 F (37.3 C), temperature source Oral, resp. rate 20, SpO2 99 %. Her examination was normal.  Her wound was clean.  Her neurovascular status was normal.  Her calf is supple and soft.  Disposition: 01-Home or Self Care  Discharge Instructions    CPM   Complete by:  As directed    CPM machine for 2 weeks.  Use CPM machine 6 hours/day divided as tolerated.  Start 0-70 degrees advance up to 110 degrees advancing 10 degrees/day.   Call MD / Call 911   Complete by:  As directed    If you experience chest  pain or shortness of breath, CALL 911 and be transported to the hospital emergency room.  If you develope a fever above 101 F, pus (white drainage) or increased drainage or redness at the wound, or calf pain, call your surgeon's office.   Change dressing   Complete by:  As directed    Only change the dressing if it becomes soiled.  It is a 2-week dressing.  If it soaks all the way through then you can change it.   Constipation Prevention   Complete by:  As directed    Drink plenty of fluids.  Prune juice may be helpful.  You may use a stool softener, such as Colace (over the counter) 100 mg twice a day.  Use MiraLax (over the counter) for constipation as needed.   Diet - low sodium heart healthy   Complete by:  As directed    Increase activity slowly as tolerated   Complete by:  As directed    TED hose   Complete by:  As directed    Wear TED hose for 2 weeks on both legs     Allergies as of 05/02/2017      Reactions   Promethazine Hcl Nausea And Vomiting, Other (See Comments)   dizzy      Medication List    STOP taking these medications   aspirin EC 81 MG tablet   BC HEADACHE POWDER PO  TAKE these medications   amLODipine 5 MG tablet Commonly known as:  NORVASC Take 1 tablet (5 mg total) by mouth daily. What changed:  when to take this   ergocalciferol 50000 units capsule Commonly known as:  VITAMIN D2 Take 1 capsule (50,000 Units total) by mouth once a week. One capsule once weekly   HYDROcodone-acetaminophen 7.5-325 MG tablet Commonly known as:  NORCO Take 1 tablet by mouth every 4 (four) hours as needed for moderate pain.   senna-docusate 8.6-50 MG tablet Commonly known as:  Senokot-S Take 1 tablet by mouth at bedtime as needed for mild constipation.   triamterene-hydrochlorothiazide 37.5-25 MG tablet Commonly known as:  MAXZIDE-25 TAKE ONE TABLET BY MOUTH ONCE DAILY.   trolamine salicylate 10 % cream Commonly known as:  ASPERCREME Apply 1 application  topically as needed for muscle pain.   VITAMIN D3 ADULT GUMMIES PO Take 1 tablet by mouth daily.            Discharge Care Instructions  (From admission, onward)        Start     Ordered   05/02/17 0000  Change dressing    Comments:  Only change the dressing if it becomes soiled.  It is a 2-week dressing.  If it soaks all the way through then you can change it.   05/02/17 0815     Contact information for after-discharge care    Destination    [facility] .   Service:  Skilled Nursing Contact information: [address] [place] [id number] [phone]              Signed: Fuller CanadaStanley Harrison 05/02/2017, 8:15 AM

## 2017-05-02 NOTE — Clinical Social Work Placement (Signed)
   CLINICAL SOCIAL WORK PLACEMENT  NOTE  Date:  05/02/2017  Patient Details  Name: Patricia EmperorSylvia A Guimaraes MRN: [id number] Date of Birth: 10/20/1945  Clinical Social Work is seeking post-discharge placement for this patient at the Skilled  Nursing Facility level of care (*CSW will initial, date and re-position this form in  chart as items are completed):  Yes   Patient/family provided with [facility] Clinical Social Work Department's list of facilities offering this level of care within the geographic area requested by the patient (or if unable, by the patient's family).  Yes   Patient/family informed of their freedom to choose among providers that offer the needed level of care, that participate in Medicare, Medicaid or managed care program needed by the patient, have an available bed and are willing to accept the patient.  Yes   Patient/family informed of [facility]'s ownership interest in [facility] and [facility], as well as of the fact that they are under no obligation to receive care at these facilities.  PASRR submitted to EDS on       PASRR number received on       Existing PASRR number confirmed on 04/29/17     FL2 transmitted to all facilities in geographic area requested by pt/family on 04/29/17     FL2 transmitted to all facilities within larger geographic area on       Patient informed that his/her managed care company has contracts with or will negotiate with certain facilities, including the following:        Yes   Patient/family informed of bed offers received.  [facility]     Physician recommends and patient chooses bed at      Patient to be [facility] on 05/02/17.  [facility]     Patient family notified on 05/02/17 of transfer.  Name of family member notified:  Maylon Cosdward Rothermel, spouse     PHYSICIAN       Additional Comment:  Facility notified and  discharge clinicals sent. LCSW signing off.    _______________________________________________ Annice NeedySettle, Onaje Warne D, LCSW 05/02/2017, 9:39 AM

## 2017-05-02 NOTE — Progress Notes (Signed)
Physical Therapy Treatment Patient Details Name: Patricia Fuentes Longino MRN: [id number] DOB: 09/13/1945 Today's Date: 05/02/2017    History of Present Illness Patricia Fuentes Baksh  is Fuentes 71 y/o female s/p Left TKA on 04/29/17 with the diagnosis of primary Osteoarthritis left knee      PT Comments    Patient demonstrates increased LLE strength and endurance for gait training with c/o less pain, no loss of balance.  Patient continued sitting up in chair after therapy.  PLAN: patient to discharge to SNF today and discharged from physical therapy to care of nursing.    Follow Up Recommendations  SNF     Equipment Recommendations  None recommended by PT    Recommendations for Other Services       Precautions / Restrictions Precautions Precautions: Fall Required Braces or Orthoses: Knee Immobilizer - Left Restrictions Weight Bearing Restrictions: Yes LLE Weight Bearing: Weight bearing as tolerated    Mobility  Bed Mobility               General bed mobility comments: Patient presents up in chair (assisted by nursing staff)  Transfers   Equipment used: Rolling walker (2 wheeled) Transfers: Sit to/from [employer] CorporationStand;Stand Pivot Transfers Sit to Stand: Min guard Stand pivot transfers: Min guard          Ambulation/Gait Ambulation/Gait assistance: Min assist Ambulation Distance (Feet): 40 Feet Assistive device: Rolling walker (2 wheeled) Gait Pattern/deviations: Decreased step length - left;Decreased stance time - left;Decreased stride length   Gait velocity interpretation: Below normal speed for age/gender General Gait Details: demonstrates slightly labored cadence without loss of balance   Stairs            Wheelchair Mobility    Modified Rankin (Stroke Patients Only)       Balance Overall balance assessment: Needs assistance Sitting-balance support: Feet supported;No upper extremity supported Sitting balance-Leahy Scale: Good     Standing balance support:  Bilateral upper extremity supported;During functional activity Standing balance-Leahy Scale: Fair                              Cognition Arousal/Alertness: Awake/alert Behavior During Therapy: WFL for tasks assessed/performed Overall Cognitive Status: Within Functional Limits for tasks assessed                                        Exercises      General Comments        Pertinent Vitals/Pain Pain Assessment: Faces Faces Pain Scale: Hurts Fuentes little bit Pain Location: left knee Pain Descriptors / Indicators: Aching;Discomfort;Sharp    Home Living                      Prior Function            PT Goals (current goals can now be found in the care plan section) Acute Rehab PT Goals Patient Stated Goal: return home after rehab PT Goal Formulation: With patient Time For Goal Achievement: 05/02/17 Potential to Achieve Goals: Good Progress towards PT goals: Progressing toward goals    Frequency    7X/week      PT Plan Current plan remains appropriate    Co-evaluation              AM-PAC PT "6 Clicks" Daily Activity  Outcome Measure    Difficulty moving from  lying on back to sitting on the side of the bed? : Fuentes Little Difficulty sitting down on and standing up from Fuentes chair with arms (e.g., wheelchair, bedside commode, etc,.)?: Fuentes Little Help needed moving to and from Fuentes bed to chair (including Fuentes wheelchair)?: Fuentes Little Help needed walking in hospital room?: Fuentes Little Help needed climbing 3-5 steps with Fuentes railing? : Fuentes Lot 6 Click Score: 14    End of Session   Activity Tolerance: Patient tolerated treatment well;Patient limited by fatigue Patient left: in chair;with call bell/phone within reach Nurse Communication: Mobility status PT Visit Diagnosis: Unsteadiness on feet (R26.81);Other abnormalities of gait and mobility (R26.89);Muscle weakness (generalized) (M62.81)     Time: [id number] PT Time Calculation (min) (ACUTE  ONLY): 15 min  Charges:  $Therapeutic Activity: 8-22 mins                    G Codes:       2:50 PM, 05/02/17 Ocie BobJames Steve Gregg, MPT Physical Therapist with [facility] [phone] office [phone] mobile phone

## 2017-05-02 NOTE — Care Management Obs Status (Signed)
MEDICARE OBSERVATION STATUS NOTIFICATION   Patient Details  Name: Patricia Fuentes MRN: [id number] Date of Birth: 04-18-1946   Medicare Observation Status Notification Given:  Yes    Malcolm MetroChildress, Zephaniah Enyeart Demske, RN 05/02/2017, 9:38 AM

## 2017-05-02 NOTE — Progress Notes (Signed)
Location:   [facility] Room Number: 158/P Place of Service:  SNF (31) Provider:  Odessa FlemingArlo,Yecenia Dalgleish  Simpson, Margaret E, MD  Patient Care Team: Kerri PerchesSimpson, Margaret E, MD as PCP - General Vickki HearingHarrison, Stanley E, MD as Consulting Physician (Orthopedic Surgery) West BaliFields, Sandi L, MD as Consulting Physician (Gastroenterology)  Extended Emergency Contact Information Primary Emergency Contact: [facility],Edward Address: [address]          [place], [place] [address] [place] Home Phone: [phone] Mobile Phone: [phone] Relation: Spouse  Code Status:  Full Code Goals of care: Advanced Directive information Advanced Directives 05/02/2017  Does Patient Have a Medical Advance Directive? Yes  Type of Advance Directive (No Data)  Does patient want to make changes to medical advance directive? No - Patient declined  Would patient like information on creating a medical advance directive? No - Patient declined    Chief complaint-acute visit status post hospitalization for left knee replacement  HPI:  Pt is a 71 y.o. female seen today for follow-up of hospitalization and now admission to skilled nursing after undergoing a left total knee replacement secondary to end-stage osteoarthritis.  Patient has a history of end-stage osteoarthritis of her knees status post right knee replacement-also history of hypertension as well as hyperlipidemia and impaired glucose tolerance and vitamin D deficiency.  She did have end-stage osteoarthritis of her left knee which required the knee replacement she tolerated the procedure well it appears she had some postop anemia hemoglobin actually is 7.1 today-she does not appear to be overtly symptomatic.  She is on Norco as needed for pain she is complaining constipation she is on senna as needed at night.  Her other medical issues appear to be stable she does have a history of hypertension she is on Norvasc as well as Maxide blood  pressure today is stable with systolic of 106 will monitor.  She also has a history of impaired glucose tolerance hemoglobin A1c was 6.1 back in September per chart review it appears her blood sugars have been more in the 100-1 20 range.  Her main complaint is some nausea she has an allergy to Phenergan.  She also is complaining of some constipation.  Vital signs are stable she is afebrile  Past Medical History:  Diagnosis Date  . Bronchitis   . Chronic back pain   . Essential hypertension   . Hyperlipidemia   . IGT (impaired glucose tolerance) 2015  . Obesity, unspecified   . Osteoarthritis   . Seasonal allergies    Past Surgical History:  Procedure Laterality Date  . CARDIAC CATHETERIZATION N/A 10/25/2014   Procedure: Left Heart Cath and Coronary Angiography;  Surgeon: Kathleene Hazelhristopher D McAlhany, MD;  [facility];  Service: Cardiovascular;  Laterality: N/A;  . COLONOSCOPY N/A 02/09/2016   Procedure: COLONOSCOPY;  Surgeon: West BaliSandi L Fields, MD;  Location: [facility];  Service: Endoscopy;  Laterality: N/A;  12:30 PM  . KNEE ARTHROSCOPY Left   . TOTAL ABDOMINAL HYSTERECTOMY  1995 approx   Fibroids  . TOTAL KNEE ARTHROPLASTY Right 2011  . TOTAL KNEE ARTHROPLASTY Left 04/29/2017   Procedure: TOTAL KNEE ARTHROPLASTY;  Surgeon: Vickki HearingHarrison, Stanley E, MD;  Location: [facility] ORS;  Service: Orthopedics;  Laterality: Left;    Allergies  Allergen Reactions  . Promethazine Hcl Nausea And Vomiting and Other (See Comments)    dizzy    Allergies as of 05/02/2017      Reactions   Promethazine Hcl Nausea And Vomiting, Other (  See Comments)   dizzy      Medication List        Accurate as of 05/02/17  1:58 PM. Always use your most recent med list.          amLODipine 5 MG tablet Commonly known as:  NORVASC Take 1 tablet (5 mg total) by mouth daily.   ergocalciferol 50000 units capsule Commonly known as:  VITAMIN D2 Take 1 capsule (50,000 Units total) by mouth once a week.  One capsule once weekly   HYDROcodone-acetaminophen 7.5-325 MG tablet Commonly known as:  NORCO Take 1 tablet by mouth every 4 (four) hours as needed for moderate pain.   senna-docusate 8.6-50 MG tablet Commonly known as:  Senokot-S Take 1 tablet by mouth at bedtime as needed for mild constipation.   triamterene-hydrochlorothiazide 37.5-25 MG tablet Commonly known as:  MAXZIDE-25 TAKE ONE TABLET BY MOUTH ONCE DAILY.   trolamine salicylate 10 % cream Commonly known as:  ASPERCREME Apply 1 application topically as needed for muscle pain.   VITAMIN D3 ADULT GUMMIES PO Take 1 tablet by mouth daily.       Review of Systems  In general she is not complaining of any fever or chills.  Skin does not complain of rashes diaphoresis surgical site is currently covered by an immobilizer.  Head ears eyes nose mouth and throat is not complaining of any visual changes sore throat or difficulty swallowing.  Respiratory denies shortness of breath or cough.  Cardiac denies chest pain does not really appear to have significant lower extremity edema.  GI is complaining of nausea is not is  complaining of vomiting--does complain of  constipation does not complain of abdominal pain GU is not complaining of dysuria.  Musculoskeletal  And appears her knee pain is controlled with the hydrocodone currently otherwise does not complain of joint pain.  Neurologic is not complaining of dizziness headache or numbness.  Psych continues to be in good spirits does not complain of overt depression or anxiety      Immunization History  Administered Date(s) Administered  . Influenza,inj,Quad PF,6+ Mos 06/22/2013, 07/18/2014, 07/04/2015, 03/21/2016, 02/11/2017  . Pneumococcal Conjugate-13 12/27/2013  . Pneumococcal Polysaccharide-23 12/15/2012   Pertinent  Health Maintenance Due  Topic Date Due  . DEXA SCAN  08/11/2012  . MAMMOGRAM  07/10/2018  . COLONOSCOPY  02/08/2026  . INFLUENZA VACCINE   Completed  . PNA vac Low Risk Adult  Completed   Fall Risk  02/11/2017 07/18/2016 01/02/2016 07/04/2015 07/18/2014  Falls in the past year? No Yes No No No  Number falls in past yr: - 1 - - -  Injury with Fall? - No - - -  Risk for fall due to : - - - - Impaired balance/gait  Follow up - Falls evaluation completed;Falls prevention discussed - - -  Comment - reveals that the patient accidentaly stumbled - - -   Functional Status Survey:    Vitals:   05/02/17 1356  BP: 106/61  Pulse: 91  Resp: (!) 21  Temp: 98.4 F (36.9 C)  TempSrc: Oral  Weight: 263 lb (119.3 kg)   Body mass index is 48.1 kg/m. Physical Exam   In general this is a pleasant  Obese she does not really have significant lower extremity edema elderly female in no distress sitting comfortably in her wheelchair.  Her skin is warm and dry.  Eyes visual acuity appears grossly intact sclera and conjunctive are clear.  Oropharynx is clear mucous membranes moist.  Chest is clear to auscultation there is no labored breathing.  Heart is regular rate and rhythm without murmur gallop or rub--pedal pulses are intact bilaterally.  Abdomen is obese soft nontender with slightly hypoactive bowel sounds.  Musculoskeletal does move upper extremities at baseline as well as lower left extremity she has a well-healed surgical scar on her left knee-her right lower extremity is in an immobilizer.  Neurologic appears grossly intact her speech is clear no lateralizing findings touch sensation appears to be intact lower extremities bilaterally.  Psych she is alert and oriented very pleasant and appropriate  Labs reviewed: Recent Labs    02/11/17 1423 04/25/17 0930 04/30/17 0428  NA 139 136 138  K 3.8 4.0 4.9  CL 99 94* 101  CO2 30 33* 31  GLUCOSE 77 102* 127*  BUN 19 18 22*  CREATININE 1.03* 0.85 1.03*  CALCIUM 8.8 8.9 7.8*   Recent Labs    02/11/17 1423 04/25/17 0930  AST 11 17  ALT 7 12*  ALKPHOS  --  78  BILITOT  0.3 0.5  PROT 7.8 7.9  ALBUMIN  --  3.6   Recent Labs    04/25/17 0930 04/30/17 0428 05/01/17 0448 05/02/17 0449  WBC 5.9 14.4* 16.9* 13.1*  NEUTROABS 3.4  --   --   --   HGB 11.7* 9.2* 7.8* 7.1*  HCT 39.0 30.9* 25.8* 24.2*  MCV 90.9 92.5 91.2 92.0  PLT 220 203 192 191   Lab Results  Component Value Date   TSH 1.72 02/11/2017   Lab Results  Component Value Date   HGBA1C 6.1 (H) 02/11/2017   Lab Results  Component Value Date   CHOL 172 02/11/2017   HDL 61 02/11/2017   LDLCALC 104 07/04/2015   TRIG 93 02/11/2017   CHOLHDL 2.8 02/11/2017    Significant Diagnostic Results in last 30 days:  Dg Chest 2 View  Result Date: 04/25/2017 CLINICAL DATA:  Preop knee surgery EXAM: CHEST  2 VIEW COMPARISON:  04/06/2016 FINDINGS: The heart size and mediastinal contours are within normal limits. Both lungs are clear. The visualized skeletal structures are unremarkable. IMPRESSION: No active cardiopulmonary disease. Electronically Signed   By: Marlan Palau M.D.   On: 04/25/2017 14:09    Assessment/Plan  #1 history of end-stage osteoarthritis with left knee replacement-she will need continued PT and OT she has Norco as needed for pain---I do note nursing has contacted orthopedics and she will be started on aspirin 81 mg daily twice daily for DVT prophylaxis  Clinically she appears to be stable in this regards.  2.  Anemia most likely postop will start her on iron twice daily-also update a CBC with differential mild to keep an eye on this clinically she appears to be asymptomatic and doing well-since her hemoglobin is 7.1-: --Also will order to guaiac stools x3.  3.  History of hypertension we have minimal readings at this point appears stable on Norvasc and Maxide.  4.  History of hyperlipidemia LDL was 92 on lab done in September 2018 since her stay here will be quite short will defer any aggressive follow-up to her primary care provider Dr. Lodema Hong.  5.  History of impaired  glucose  hemoglobin A1c in September was 6.1 she has been advised to avoid starchy foods again this is followed closely by her primary care provider    #6 Suspected constipation she does have an order for senna nightly-will order an x-ray of her abdomen-I suspect however this is mainly  narcotic related constipation may have to add another agent especially since she has now been started on iron  #7 nausea-she is trying ginger ale to see if this will help she has a listed allergy to Phenergan- if not effective have written an order for as needed Zofran every 8 hours.  8.  History of vitamin D deficiency she is on supplementation again this is followed closely by Dr. Lodema HongSimpson vitamin D level back in September was low at 15.  Again will update a CBC with differential tomorrow and start her on iron-as well as guaiac stools x3- have also started Zofran as needed for nausea unrelieved with ginger ale-and ordered an x-ray of her abdomen-consider adding another agent for constipation.  [id number]- of note greater than 40 minutes spent assessing patient-reviewing her chart-reviewed her labs-and coordinating and formulating the plan of care for numerous diagnoses-of note greater than 50% of time spent coordinating plan of care

## 2017-05-03 ENCOUNTER — Encounter (HOSPITAL_COMMUNITY)
Admission: RE | Admit: 2017-05-03 | Discharge: 2017-05-03 | Disposition: A | Discharge status: Home / Self Care | Payer: Medicare HMO | Source: Other Acute Inpatient Hospital | Attending: Internal Medicine | Admitting: Internal Medicine

## 2017-05-03 DIAGNOSIS — I10 Essential (primary) hypertension: Secondary | ICD-10-CM | POA: Insufficient documentation

## 2017-05-03 LAB — CBC WITH DIFFERENTIAL/PLATELET
Basophils Absolute: 0 10*3/uL (ref 0.0–0.1)
Basophils Relative: 0 %
Eosinophils Absolute: 0.5 10*3/uL (ref 0.0–0.7)
Eosinophils Relative: 4 %
HEMATOCRIT: 23.7 % — AB (ref 36.0–46.0)
HEMOGLOBIN: 7.3 g/dL — AB (ref 12.0–15.0)
LYMPHS ABS: 2 10*3/uL (ref 0.7–4.0)
Lymphocytes Relative: 16 %
MCH: 27.9 pg (ref 26.0–34.0)
MCHC: 30.8 g/dL (ref 30.0–36.0)
MCV: 90.5 fL (ref 78.0–100.0)
MONOS PCT: 9 %
Monocytes Absolute: 1.2 10*3/uL — ABNORMAL HIGH (ref 0.1–1.0)
NEUTROS ABS: 8.9 10*3/uL — AB (ref 1.7–7.7)
NEUTROS PCT: 71 %
Platelets: 239 10*3/uL (ref 150–400)
RBC: 2.62 MIL/uL — ABNORMAL LOW (ref 3.87–5.11)
RDW: 14.4 % (ref 11.5–15.5)
WBC: 12.5 10*3/uL — ABNORMAL HIGH (ref 4.0–10.5)

## 2017-05-05 ENCOUNTER — Other Ambulatory Visit (HOSPITAL_COMMUNITY)
Admission: RE | Admit: 2017-05-05 | Discharge: 2017-05-05 | Disposition: A | Discharge status: Home / Self Care | Payer: Medicare HMO | Source: Other Acute Inpatient Hospital | Attending: Internal Medicine | Admitting: Internal Medicine

## 2017-05-05 DIAGNOSIS — Z471 Aftercare following joint replacement surgery: Secondary | ICD-10-CM | POA: Insufficient documentation

## 2017-05-05 LAB — CBC WITH DIFFERENTIAL/PLATELET
BASOS ABS: 0 10*3/uL (ref 0.0–0.1)
BASOS PCT: 0 %
EOS ABS: 0.5 10*3/uL (ref 0.0–0.7)
Eosinophils Relative: 4 %
HEMATOCRIT: 26.5 % — AB (ref 36.0–46.0)
Hemoglobin: 8 g/dL — ABNORMAL LOW (ref 12.0–15.0)
Lymphocytes Relative: 22 %
Lymphs Abs: 2.6 10*3/uL (ref 0.7–4.0)
MCH: 27 pg (ref 26.0–34.0)
MCHC: 30.2 g/dL (ref 30.0–36.0)
MCV: 89.5 fL (ref 78.0–100.0)
MONO ABS: 1.4 10*3/uL — AB (ref 0.1–1.0)
Monocytes Relative: 12 %
NEUTROS ABS: 7.2 10*3/uL (ref 1.7–7.7)
NEUTROS PCT: 62 %
PLATELETS: 356 10*3/uL (ref 150–400)
RBC: 2.96 MIL/uL — ABNORMAL LOW (ref 3.87–5.11)
RDW: 14.1 % (ref 11.5–15.5)
WBC: 11.6 10*3/uL — ABNORMAL HIGH (ref 4.0–10.5)

## 2017-05-06 ENCOUNTER — Non-Acute Institutional Stay (SKILLED_NURSING_FACILITY): Payer: Medicare HMO | Admitting: Internal Medicine

## 2017-05-06 ENCOUNTER — Encounter: Payer: Self-pay | Admitting: Internal Medicine

## 2017-05-06 DIAGNOSIS — K5900 Constipation, unspecified: Secondary | ICD-10-CM | POA: Diagnosis not present

## 2017-05-06 DIAGNOSIS — D649 Anemia, unspecified: Secondary | ICD-10-CM | POA: Insufficient documentation

## 2017-05-06 DIAGNOSIS — I10 Essential (primary) hypertension: Secondary | ICD-10-CM | POA: Diagnosis not present

## 2017-05-06 DIAGNOSIS — Z96652 Presence of left artificial knee joint: Secondary | ICD-10-CM | POA: Diagnosis not present

## 2017-05-06 NOTE — Progress Notes (Signed)
Provider:  Einar Crow Location:   [facility] Room Number: 158/P Place of Service:  SNF (31)  PCP: Kerri Perches, MD Patient Care Team: Kerri Perches, MD as PCP - General Vickki Hearing, MD as Consulting Physician (Orthopedic Surgery) West Bali, MD as Consulting Physician (Gastroenterology)  Extended Emergency Contact Information Primary Emergency Contact: [facility] Address: [address]          [place], [place] [address] [place] Home Phone: [phone] Mobile Phone: [phone] Relation: Spouse  Code Status: Full Code Goals of Care: Advanced Directive information Advanced Directives 05/06/2017  Does Patient Have a Medical Advance Directive? Yes  Type of Advance Directive (No Data)  Does patient want to make changes to medical advance directive? No - Patient declined  Would patient like information on creating a medical advance directive? No - Patient declined      Chief Complaint  Patient presents with  . New Admit To SNF    New Admission Visit    HPI: Patient is a 71 y.o. female seen today for admission to to SNF for therapy after undergoing left total knee arthroplasty with patellar tendon reinforcement on 12/04.  Patient has a history of  Hypertension and osteoarthritis.  She failed conservative treatment of her left knee and underwent surgery on 12/04. She is now in the facility for rehab.  Her postop course was uncomplicated.. She is doing well with therapy.  Her pain is controlled on hydrocodone. Patient states she lives with her husband.  Was very independent before surgery.  Her house is one level.  And she plans to go home when she gets more independent. .  Past Medical History:  Diagnosis Date  . Bronchitis   . Chronic back pain   . Essential hypertension   . Hyperlipidemia   . IGT (impaired glucose tolerance) 2015  . Obesity, unspecified   . Osteoarthritis   . Seasonal allergies     Past Surgical History:  Procedure Laterality Date  . CARDIAC CATHETERIZATION N/A 10/25/2014   Procedure: Left Heart Cath and Coronary Angiography;  Surgeon: Kathleene Hazel, MD;  Location: [facility] INVASIVE CV LAB;  Service: Cardiovascular;  Laterality: N/A;  . COLONOSCOPY N/A 02/09/2016   Procedure: COLONOSCOPY;  Surgeon: West Bali, MD;  Location: [facility] ENDO SUITE;  Service: Endoscopy;  Laterality: N/A;  12:30 PM  . KNEE ARTHROSCOPY Left   . TOTAL ABDOMINAL HYSTERECTOMY  1995 approx   Fibroids  . TOTAL KNEE ARTHROPLASTY Right 2011  . TOTAL KNEE ARTHROPLASTY Left 04/29/2017   Procedure: TOTAL KNEE ARTHROPLASTY;  Surgeon: Vickki Hearing, MD;  Location: [facility] ORS;  Service: Orthopedics;  Laterality: Left;    reports that  has never smoked. she has never used smokeless tobacco. She reports that she does not drink alcohol or use drugs. Social History   Socioeconomic History  . Marital status: Married    Spouse name: Not on file  . Number of children: Not on file  . Years of education: Not on file  . Highest education level: Not on file  Social Needs  . Financial resource strain: Not on file  . Food insecurity - worry: Not on file  . Food insecurity - inability: Not on file  . Transportation needs - medical: Not on file  . Transportation needs - non-medical: Not on file  Occupational History  . Occupation: retired    Associate Professor: UNEMPLOYED  Tobacco Use  . Smoking status: Never Smoker  . Smokeless  tobacco: Never Used  Substance and Sexual Activity  . Alcohol use: No  . Drug use: No  . Sexual activity: Yes    Birth control/protection: Surgical  Other Topics Concern  . Not on file  Social History Narrative  . Not on file    Functional Status Survey:    Family History  Problem Relation Age of Onset  . Hypertension Mother   . Stroke Mother 3859       Deceased due CVa at 159  . Diabetes Father   . Cancer Father [id number]       Unknown, somewhere in stomach  . Hypertension  Brother   . Hypertension Brother   . Hypertension Brother   . Cancer Maternal Grandfather        ?type  . Hypertension Son     Health Maintenance  Topic Date Due  . DEXA SCAN  08/11/2012  . TETANUS/TDAP  03/11/2018 (Originally 08/12/1964)  . MAMMOGRAM  07/10/2018  . COLONOSCOPY  02/08/2026  . INFLUENZA VACCINE  Completed  . Hepatitis C Screening  Completed  . PNA vac Low Risk Adult  Completed    Allergies  Allergen Reactions  . Promethazine Hcl Nausea And Vomiting and Other (See Comments)    dizzy    Outpatient Encounter Medications as of 05/06/2017  Medication Sig  . amLODipine (NORVASC) 5 MG tablet Take 1 tablet (5 mg total) by mouth daily.  Marland Kitchen. aspirin EC 81 MG tablet Take 81 mg by mouth 2 (two) times daily.  . Cholecalciferol (VITAMIN D3 ADULT GUMMIES PO) Take 1 tablet by mouth daily.  . ergocalciferol (VITAMIN D2) 50000 units capsule Take 1 capsule (50,000 Units total) by mouth once a week. One capsule once weekly  . ferrous sulfate (IRON SUPPLEMENT) 325 (65 FE) MG tablet Take 325 mg by mouth 2 (two) times daily with a meal.  . HYDROcodone-acetaminophen (NORCO) 7.5-325 MG tablet Take 1 tablet by mouth every 4 (four) hours as needed for moderate pain.  Marland Kitchen. ondansetron (ZOFRAN) 4 MG tablet Take 4 mg by mouth every 8 (eight) hours as needed for nausea or vomiting.  . senna-docusate (SENOKOT-S) 8.6-50 MG tablet Take 1 tablet by mouth at bedtime as needed for mild constipation.  . triamterene-hydrochlorothiazide (MAXZIDE-25) 37.5-25 MG tablet TAKE ONE TABLET BY MOUTH ONCE DAILY.  Marland Kitchen. trolamine salicylate (ASPERCREME) 10 % cream Apply 1 application topically as needed for muscle pain.   No facility-administered encounter medications on file as of 05/06/2017.      Review of Systems  Review of Systems  Constitutional: Negative for activity change, appetite change, chills, diaphoresis, fatigue and fever.  HENT: Negative for mouth sores, postnasal drip, rhinorrhea, sinus pain and  sore throat.   Respiratory: Negative for apnea, cough, chest tightness, shortness of breath and wheezing.   Cardiovascular: Negative for chest pain, palpitations and leg swelling.  Gastrointestinal: Negative for abdominal distention, abdominal pain, positive for constipation,  Genitourinary: Negative for dysuria and frequency.  Musculoskeletal: Negative for arthralgias, joint swelling and myalgias.  Skin: Negative for rash.  Neurological: Negative for dizziness, syncope, weakness, light-headedness and numbness.  Psychiatric/Behavioral: Negative for behavioral problems, confusion and sleep disturbance.   Positive for  Vitals:   05/06/17 1438  BP: 121/74  Pulse: 73  Resp: 18  Temp: (!) 97 F (36.1 C)  TempSrc: Oral   There is no height or weight on file to calculate BMI. Physical Exam  Constitutional: She is oriented to person, place, and time. She appears well-developed and well-nourished.  HENT:  Head: Normocephalic.  Mouth/Throat: Oropharynx is clear and moist.  Eyes: Pupils are equal, round, and reactive to light.  Neck: Neck supple.  Cardiovascular: Normal rate and normal heart sounds.  No murmur heard. Pulmonary/Chest: Effort normal and breath sounds normal. No respiratory distress. She has no wheezes. She has no rales.  Abdominal: Soft. Bowel sounds are normal. She exhibits no distension. There is no tenderness.  Musculoskeletal:  Does have mild Swelling Bilateral  Lymphadenopathy:    She has no cervical adenopathy.  Neurological: She is alert and oriented to person, place, and time.  No Focal deficits  Skin: Skin is warm and dry.  Psychiatric: She has a normal mood and affect. Her behavior is normal. Thought content normal.    Labs reviewed: Basic Metabolic Panel: Recent Labs    02/11/17 1423 04/25/17 0930 04/30/17 0428  NA 139 136 138  K 3.8 4.0 4.9  CL 99 94* 101  CO2 30 33* 31  GLUCOSE 77 102* 127*  BUN 19 18 22*  CREATININE 1.03* 0.85 1.03*  CALCIUM  8.8 8.9 7.8*   Liver Function Tests: Recent Labs    02/11/17 1423 04/25/17 0930  AST 11 17  ALT 7 12*  ALKPHOS  --  78  BILITOT 0.3 0.5  PROT 7.8 7.9  ALBUMIN  --  3.6   No results for input(s): LIPASE, AMYLASE in the last 8760 hours. No results for input(s): AMMONIA in the last 8760 hours. CBC: Recent Labs    04/25/17 0930  05/02/17 0449 05/03/17 0815 05/05/17 1015  WBC 5.9   < > 13.1* 12.5* 11.6*  NEUTROABS 3.4  --   --  8.9* 7.2  HGB 11.7*   < > 7.1* 7.3* 8.0*  HCT 39.0   < > 24.2* 23.7* 26.5*  MCV 90.9   < > 92.0 90.5 89.5  PLT 220   < > 191 239 356   < > = values in this interval not displayed.   Cardiac Enzymes: No results for input(s): CKTOTAL, CKMB, CKMBINDEX, TROPONINI in the last 8760 hours. BNP: Invalid input(s): POCBNP Lab Results  Component Value Date   HGBA1C 6.1 (H) 02/11/2017   Lab Results  Component Value Date   TSH 1.72 02/11/2017   No results found for: VITAMINB12 No results found for: FOLATE No results found for: IRON, TIBC, FERRITIN  Imaging and Procedures obtained prior to SNF admission: No results found.  Assessment/Plan  S/P left total knee arthroplasty Patient is doing well with therapy. She is on aspirin for DVT prophylaxis. Pain is controlled and she is going to follow-up with Dr. Romeo AppleHarrison.  She plans to eventually go home when she is little bit more independent.  Essential hypertension Her blood pressure is controlled on MaxZide and Norvasc  Anemia,  Patient has significant drop of her hemoglobin from 11.7 to 7.1.  Postop She was  she was started on iron and repeat hemoglobin is 8 today We will continue iron and get a follow-up CBC in a week Constipation we will start her on Miralax.     Family/ staff Communication:   Labs/tests ordered: Total time spent in this patient care encounter was 45_ minutes; greater than 50% of the visit spent counseling patient, reviewing records , Labs and coordinating care for problems  addressed at this encounter.

## 2017-05-09 ENCOUNTER — Encounter (HOSPITAL_COMMUNITY)
Admission: RE | Admit: 2017-05-09 | Discharge: 2017-05-09 | Disposition: A | Discharge status: Home / Self Care | Payer: Medicare HMO | Source: Skilled Nursing Facility | Attending: *Deleted | Admitting: *Deleted

## 2017-05-09 ENCOUNTER — Other Ambulatory Visit: Payer: Self-pay

## 2017-05-09 LAB — OCCULT BLOOD X 1 CARD TO LAB, STOOL: FECAL OCCULT BLD: POSITIVE — AB

## 2017-05-09 MED ORDER — HYDROCODONE-ACETAMINOPHEN 7.5-325 MG PO TABS: 1.0000 | ORAL_TABLET | ORAL | 0 refills | Status: DC | PRN

## 2017-05-09 NOTE — Telephone Encounter (Signed)
This encounter was created in error - please disregard.

## 2017-05-12 ENCOUNTER — Other Ambulatory Visit: Payer: Self-pay

## 2017-05-12 MED ORDER — HYDROCODONE-ACETAMINOPHEN 7.5-325 MG PO TABS: 1.0000 | ORAL_TABLET | ORAL | 0 refills | Status: DC | PRN

## 2017-05-12 NOTE — Telephone Encounter (Signed)
RX Fax for [facility]@ [phone]  

## 2017-05-13 ENCOUNTER — Encounter (HOSPITAL_COMMUNITY)
Admission: RE | Admit: 2017-05-13 | Discharge: 2017-05-13 | Disposition: A | Discharge status: Home / Self Care | Payer: Medicare HMO | Source: Skilled Nursing Facility | Attending: *Deleted | Admitting: *Deleted

## 2017-05-13 LAB — CBC
HEMATOCRIT: 29.5 % — AB (ref 36.0–46.0)
HEMOGLOBIN: 8.9 g/dL — AB (ref 12.0–15.0)
MCH: 27.5 pg (ref 26.0–34.0)
MCHC: 30.2 g/dL (ref 30.0–36.0)
MCV: 91 fL (ref 78.0–100.0)
Platelets: 319 10*3/uL (ref 150–400)
RBC: 3.24 MIL/uL — ABNORMAL LOW (ref 3.87–5.11)
RDW: 16.5 % — AB (ref 11.5–15.5)
WBC: 10 10*3/uL (ref 4.0–10.5)

## 2017-05-13 LAB — BASIC METABOLIC PANEL
Anion gap: 12 (ref 5–15)
BUN: 21 mg/dL — AB (ref 6–20)
CALCIUM: 9 mg/dL (ref 8.9–10.3)
CHLORIDE: 97 mmol/L — AB (ref 101–111)
CO2: 28 mmol/L (ref 22–32)
CREATININE: 1.95 mg/dL — AB (ref 0.44–1.00)
GFR calc Af Amer: 29 mL/min — ABNORMAL LOW (ref >60)
GFR calc non Af Amer: 25 mL/min — ABNORMAL LOW (ref >60)
Glucose, Bld: 116 mg/dL — ABNORMAL HIGH (ref 65–99)
Potassium: 3.8 mmol/L (ref 3.5–5.1)
SODIUM: 137 mmol/L (ref 135–145)

## 2017-05-14 ENCOUNTER — Ambulatory Visit (INDEPENDENT_AMBULATORY_CARE_PROVIDER_SITE_OTHER): Payer: Self-pay | Admitting: Orthopedic Surgery

## 2017-05-14 ENCOUNTER — Other Ambulatory Visit: Payer: Self-pay

## 2017-05-14 ENCOUNTER — Encounter: Payer: Self-pay | Admitting: Internal Medicine

## 2017-05-14 ENCOUNTER — Non-Acute Institutional Stay (SKILLED_NURSING_FACILITY): Payer: Medicare HMO | Admitting: Internal Medicine

## 2017-05-14 ENCOUNTER — Encounter: Payer: Self-pay | Admitting: Orthopedic Surgery

## 2017-05-14 DIAGNOSIS — Z96652 Presence of left artificial knee joint: Secondary | ICD-10-CM

## 2017-05-14 DIAGNOSIS — N289 Disorder of kidney and ureter, unspecified: Secondary | ICD-10-CM | POA: Diagnosis not present

## 2017-05-14 DIAGNOSIS — D649 Anemia, unspecified: Secondary | ICD-10-CM

## 2017-05-14 DIAGNOSIS — I10 Essential (primary) hypertension: Secondary | ICD-10-CM | POA: Diagnosis not present

## 2017-05-14 DIAGNOSIS — M1712 Unilateral primary osteoarthritis, left knee: Secondary | ICD-10-CM

## 2017-05-14 MED ORDER — HYDROCODONE-ACETAMINOPHEN 7.5-325 MG PO TABS: 1.0000 | ORAL_TABLET | ORAL | 0 refills | Status: DC | PRN

## 2017-05-14 NOTE — Telephone Encounter (Signed)
RX Fax for [facility]@ [phone]  

## 2017-05-14 NOTE — Progress Notes (Signed)
Location:   [facility] Room Number: 158/P Place of Service:  SNF [phone]) Provider:  Odessa Fleming, MD  Patient Care Team: Kerri Perches, MD as PCP - General Vickki Hearing, MD as Consulting Physician (Orthopedic Surgery) West Bali, MD as Consulting Physician (Gastroenterology)  Extended Emergency Contact Information Primary Emergency Contact: [facility] Address: [address]          [place], [place] [address] [place] Home Phone: [phone] Mobile Phone: [phone] Relation: Spouse  Code Status:  Full Code Goals of care: Advanced Directive information Advanced Directives 05/14/2017  Does Patient Have a Medical Advance Directive? Yes  Type of Advance Directive (No Data)  Does patient want to make changes to medical advance directive? No - Patient declined  Would patient like information on creating a medical advance directive? No - Patient declined     Chief Complaint  Patient presents with  . Acute Visit    F/U Kidney Function    HPI:  Pt is a 71 y.o. female seen today for an acute visit for a creatinine of 1.95-recent creatinines have been in the 1 range.  She is here for rehab after undergoing a left total knee arthroplasty with patellar tendon reinforcement.  She also has a history of hypertension osteoarthritis.  Routine lab yesterday showed her creatinine had risen up to 1.95 with a BUN of 21 previous creatinine was 1.03 with a BUN of 22-this appears to be her baseline with creatinine running from 0.77 up to 1.03.  Her Maxide was discontinued by Dr.Gupta yesterday.  She continues on Norvasc for hypertension and this appears to be stable I got a manual reading of 122/68 today.  She is seeing orthopedics later today for follow-up of her knee replacement she appears to be doing well with therapy he has an immobilizer on- she does receive Vicodin 7.5 325 mg every 4 hours as needed for pain  she says this is effective she also has Robaxin as needed.  She does complain of occasional nausea she says the Zofran does help-she says her appetite is not all that good however-.  We have also followed her hemoglobin postop was in the low sevens range but this appears to be gradually rising was 8.9 on lab done yesterday she is on iron.  Currently she has no acute complaints at times has constipation but says she had a bowel movement yesterday that was decent she is on Senokot as needed.  She is also on MiraLAX.     Past Medical History:  Diagnosis Date  . Bronchitis   . Chronic back pain   . Essential hypertension   . Hyperlipidemia   . IGT (impaired glucose tolerance) 2015  . Obesity, unspecified   . Osteoarthritis   . Seasonal allergies    Past Surgical History:  Procedure Laterality Date  . CARDIAC CATHETERIZATION N/A 10/25/2014   Procedure: Left Heart Cath and Coronary Angiography;  Surgeon: Kathleene Hazel, MD;  Location: [facility] INVASIVE CV LAB;  Service: Cardiovascular;  Laterality: N/A;  . COLONOSCOPY N/A 02/09/2016   Procedure: COLONOSCOPY;  Surgeon: West Bali, MD;  Location: [facility] ENDO SUITE;  Service: Endoscopy;  Laterality: N/A;  12:30 PM  . KNEE ARTHROSCOPY Left   . TOTAL ABDOMINAL HYSTERECTOMY  1995 approx   Fibroids  . TOTAL KNEE ARTHROPLASTY Right 2011  . TOTAL KNEE ARTHROPLASTY Left 04/29/2017   Procedure: TOTAL KNEE ARTHROPLASTY;  Surgeon: Vickki Hearing, MD;  Location: [facility] ORS;  Service: Orthopedics;  Laterality: Left;    Allergies  Allergen Reactions  . Promethazine Hcl Nausea And Vomiting and Other (See Comments)    dizzy    Outpatient Encounter Medications as of 05/14/2017  Medication Sig  . amLODipine (NORVASC) 5 MG tablet Take 1 tablet (5 mg total) by mouth daily.  Marland Kitchen aspirin EC 81 MG tablet Take 81 mg by mouth 2 (two) times daily.  . ergocalciferol (VITAMIN D2) 50000 units capsule Take 1 capsule (50,000 Units total) by mouth once a week.  One capsule once weekly  . ferrous sulfate (IRON SUPPLEMENT) 325 (65 FE) MG tablet Take 325 mg by mouth 2 (two) times daily with a meal.  . HYDROcodone-acetaminophen (NORCO) 7.5-325 MG tablet Take 1 tablet by mouth every 4 (four) hours as needed for moderate pain.  . methocarbamol (ROBAXIN) 500 MG tablet Take 500 mg by mouth every 6 (six) hours as needed for muscle spasms.  . ondansetron (ZOFRAN) 4 MG tablet Take 4 mg by mouth every 8 (eight) hours as needed for nausea or vomiting.  . polyethylene glycol (MIRALAX / GLYCOLAX) packet Take 17 g by mouth daily.  Marland Kitchen senna-docusate (SENOKOT-S) 8.6-50 MG tablet Take 1 tablet by mouth at bedtime as needed for mild constipation.  . trolamine salicylate (ASPERCREME) 10 % cream Apply 1 application topically as needed for muscle pain.  . Vitamin D, Cholecalciferol, 1000 units TABS Take 1,000 Units by mouth daily.  . [DISCONTINUED] Cholecalciferol (VITAMIN D3 ADULT GUMMIES PO) Take 1 tablet by mouth daily.  . [DISCONTINUED] triamterene-hydrochlorothiazide (MAXZIDE-25) 37.5-25 MG tablet TAKE ONE TABLET BY MOUTH ONCE DAILY.   No facility-administered encounter medications on file as of 05/14/2017.     Review of Systems   In general she is not complaining of fever chills-if scales are accurate she has lost about-15 pounds since admission  Skin does not complain of rashes or itching.  Head ears eyes nose mouth and throat is not complaining of any sore throat or difficulty swallowing or visual changes.  Respiratory denies shortness of breath or cough.  Cardiac denies chest pain or significant lower extremity edema.  GI does complain of nausea she attributes to her medications for pain but does not really want her pain medication changee because it does help quite a bit.  Is not complaining of any vomiting or abdominal discomfort.  At times has complained of constipation but apparently this is improving on MiraLAX as well as as needed Senokot.  GU does  not complain of dysuria.  Musculoskeletal again says her joint pain knee pain currently is controlled on the Vicodin.  Neurologic does not complain of dizziness headache numbness or syncope.  Psych continues to be in very good spirits is not complaining of any anxiety or depression  Immunization History  Administered Date(s) Administered  . Influenza,inj,Quad PF,6+ Mos 06/22/2013, 07/18/2014, 07/04/2015, 03/21/2016, 02/11/2017  . Pneumococcal Conjugate-13 12/27/2013  . Pneumococcal Polysaccharide-23 12/15/2012   Pertinent  Health Maintenance Due  Topic Date Due  . DEXA SCAN  06/14/2017 (Originally 08/11/2012)  . MAMMOGRAM  07/10/2018  . COLONOSCOPY  02/08/2026  . INFLUENZA VACCINE  Completed  . PNA vac Low Risk Adult  Completed   Fall Risk  02/11/2017 07/18/2016 01/02/2016 07/04/2015 07/18/2014  Falls in the past year? No Yes No No No  Number falls in past yr: - 1 - - -  Injury with Fall? - No - - -  Risk for fall due to : - - - -  Impaired balance/gait  Follow up - Falls evaluation completed;Falls prevention discussed - - -  Comment - reveals that the patient accidentaly stumbled - - -   Functional Status Survey:    She is afebrile respirations of 18 pulse of 92 check manually and blood pressure 122/60 manually weight is 246  Physical Exam   In general this is a very pleasant morbidly obese elderly female in no distress.  Her skin is warm and dry.  Eyes visual acuity appears grossly intact sclera and conjunctive are clear.  Oropharynx is clear mucous membranes moist.  Chest is clear to auscultation there is no labored breathing.  Heart is regular rate and rhythm without murmur gallop or rub pedal pulses are intact edema appears to be quite scan of her feet left leg was difficult to assess since she has an immobilizer on.  Abdomen is obese soft nontender with positive bowel sounds.  Musculoskeletal he is ambulating now with a walker and appears to be doing well she does have  an immobilizer on her left leg-upper extremity strength appears preserved.  Neurologic is grossly intact her speech is clear no lateralizing findings touch sensation is intact left lower extremity.  Psych she is alert and oriented pleasant and appropriate  Labs reviewed: Recent Labs    04/25/17 0930 04/30/17 0428 05/13/17 0730  NA 136 138 137  K 4.0 4.9 3.8  CL 94* 101 97*  CO2 33* 31 28  GLUCOSE 102* 127* 116*  BUN 18 22* 21*  CREATININE 0.85 1.03* 1.95*  CALCIUM 8.9 7.8* 9.0   Recent Labs    02/11/17 1423 04/25/17 0930  AST 11 17  ALT 7 12*  ALKPHOS  --  78  BILITOT 0.3 0.5  PROT 7.8 7.9  ALBUMIN  --  3.6   Recent Labs    04/25/17 0930  05/03/17 0815 05/05/17 1015 05/13/17 0730  WBC 5.9   < > 12.5* 11.6* 10.0  NEUTROABS 3.4  --  8.9* 7.2  --   HGB 11.7*   < > 7.3* 8.0* 8.9*  HCT 39.0   < > 23.7* 26.5* 29.5*  MCV 90.9   < > 90.5 89.5 91.0  PLT 220   < > 239 356 319   < > = values in this interval not displayed.   Lab Results  Component Value Date   TSH 1.72 02/11/2017   Lab Results  Component Value Date   HGBA1C 6.1 (H) 02/11/2017   Lab Results  Component Value Date   CHOL 172 02/11/2017   HDL 61 02/11/2017   LDLCALC 104 07/04/2015   TRIG 93 02/11/2017   CHOLHDL 2.8 02/11/2017    Significant Diagnostic Results in last 30 days:  Dg Chest 2 View  Result Date: 04/25/2017 CLINICAL DATA:  Preop knee surgery EXAM: CHEST  2 VIEW COMPARISON:  04/06/2016 FINDINGS: The heart size and mediastinal contours are within normal limits. Both lungs are clear. The visualized skeletal structures are unremarkable. IMPRESSION: No active cardiopulmonary disease. Electronically Signed   By: Marlan Palauharles  Clark M.D.   On: 04/25/2017 14:09   Dg Knee Left Port  Result Date: 04/29/2017 CLINICAL DATA:  Status post total knee replacement EXAM: PORTABLE LEFT KNEE - 1-2 VIEW COMPARISON:  March 11, 2016 FINDINGS: Frontal and lateral views were obtained. There is a total knee  replacement with prosthetic components well-seated. No acute fracture or dislocation. There is spurring along the anterior patella, stable. There is fluid within the joint with a drain located posterior  to the patella. IMPRESSION: Femoral and tibial prosthetic components well-seated. No acute fracture or dislocation. Stable spurring along the anterior patella. Electronically Signed   By: Bretta BangWilliam  Woodruff III M.D.   On: 04/29/2017 10:56    Assessment/Plan  #1-renal insufficiency creatinine appears to have risen-her Maxide has been discontinued- we have encouraged p.o. fluids and I did discuss this with her at bedside- will update a BMP tomorrow clinically.  She appears to be doing well  2.  Anemia most likely postop anemia.  Hemoglobin is rising at 8.9--she has been started on iron.  3.-  Hypertension this appears stable despite being off the Maxide she is on Norvasc 5 mg a day we will continue to monitor   4--history of end-stage osteoarthritis status post left knee replacement-she appears to be doing well with therapy pain is controlled with hydrocodone she does not really want this change despite the nausea-she does have as neededZofran   #5 history of weight loss- with her morbid obesity this can be somewhat positive but will have to be watched apparently she is not eating very well we have to encourage fluids and will continue to monitor to see if this is a true trend  [id number]   this will have to be watched-

## 2017-05-14 NOTE — Progress Notes (Signed)
Postop visit #1 after knee replacement  Chief Complaint  Patient presents with  . Routine Post Op    04/29/17 total knee replacement left with patella tendon reinforcement    Patient's pain is controlled she is walking with a walker and a brace her range of motion is 0-70 degrees she is in a CPM machine.  She is doing well at the nursing center  She will continue with her CPM machine physical therapy follow-up in 4 weeks  We did take the staples out today and the incision looks good.

## 2017-05-15 ENCOUNTER — Encounter (HOSPITAL_COMMUNITY)
Admission: RE | Admit: 2017-05-15 | Discharge: 2017-05-15 | Disposition: A | Discharge status: Home / Self Care | Payer: Medicare HMO | Source: Skilled Nursing Facility | Attending: Internal Medicine | Admitting: Internal Medicine

## 2017-05-15 DIAGNOSIS — E785 Hyperlipidemia, unspecified: Secondary | ICD-10-CM | POA: Insufficient documentation

## 2017-05-15 DIAGNOSIS — M1990 Unspecified osteoarthritis, unspecified site: Secondary | ICD-10-CM | POA: Insufficient documentation

## 2017-05-15 DIAGNOSIS — E669 Obesity, unspecified: Secondary | ICD-10-CM | POA: Insufficient documentation

## 2017-05-15 DIAGNOSIS — Z471 Aftercare following joint replacement surgery: Secondary | ICD-10-CM | POA: Insufficient documentation

## 2017-05-15 DIAGNOSIS — I10 Essential (primary) hypertension: Secondary | ICD-10-CM | POA: Insufficient documentation

## 2017-05-15 DIAGNOSIS — E559 Vitamin D deficiency, unspecified: Secondary | ICD-10-CM | POA: Insufficient documentation

## 2017-05-15 LAB — BASIC METABOLIC PANEL
Anion gap: 10 (ref 5–15)
BUN: 24 mg/dL — ABNORMAL HIGH (ref 6–20)
CALCIUM: 9 mg/dL (ref 8.9–10.3)
CO2: 29 mmol/L (ref 22–32)
CREATININE: 1.95 mg/dL — AB (ref 0.44–1.00)
Chloride: 99 mmol/L — ABNORMAL LOW (ref 101–111)
GFR calc Af Amer: 29 mL/min — ABNORMAL LOW (ref >60)
GFR calc non Af Amer: 25 mL/min — ABNORMAL LOW (ref >60)
GLUCOSE: 104 mg/dL — AB (ref 65–99)
Potassium: 4.2 mmol/L (ref 3.5–5.1)
Sodium: 138 mmol/L (ref 135–145)

## 2017-05-16 ENCOUNTER — Encounter (HOSPITAL_COMMUNITY)
Admission: RE | Admit: 2017-05-16 | Discharge: 2017-05-16 | Disposition: A | Discharge status: Home / Self Care | Payer: Medicare HMO | Source: Skilled Nursing Facility | Attending: Internal Medicine | Admitting: Internal Medicine

## 2017-05-16 ENCOUNTER — Non-Acute Institutional Stay (SKILLED_NURSING_FACILITY): Payer: Medicare HMO | Admitting: Internal Medicine

## 2017-05-16 ENCOUNTER — Encounter: Payer: Self-pay | Admitting: Internal Medicine

## 2017-05-16 DIAGNOSIS — I10 Essential (primary) hypertension: Secondary | ICD-10-CM

## 2017-05-16 DIAGNOSIS — D649 Anemia, unspecified: Secondary | ICD-10-CM | POA: Diagnosis not present

## 2017-05-16 DIAGNOSIS — M1712 Unilateral primary osteoarthritis, left knee: Secondary | ICD-10-CM | POA: Diagnosis not present

## 2017-05-16 DIAGNOSIS — N289 Disorder of kidney and ureter, unspecified: Secondary | ICD-10-CM

## 2017-05-16 DIAGNOSIS — E559 Vitamin D deficiency, unspecified: Secondary | ICD-10-CM | POA: Insufficient documentation

## 2017-05-16 DIAGNOSIS — M1990 Unspecified osteoarthritis, unspecified site: Secondary | ICD-10-CM | POA: Insufficient documentation

## 2017-05-16 DIAGNOSIS — E785 Hyperlipidemia, unspecified: Secondary | ICD-10-CM | POA: Insufficient documentation

## 2017-05-16 DIAGNOSIS — E669 Obesity, unspecified: Secondary | ICD-10-CM | POA: Insufficient documentation

## 2017-05-16 DIAGNOSIS — Z471 Aftercare following joint replacement surgery: Secondary | ICD-10-CM | POA: Insufficient documentation

## 2017-05-16 LAB — BASIC METABOLIC PANEL
ANION GAP: 13 (ref 5–15)
BUN: 24 mg/dL — ABNORMAL HIGH (ref 6–20)
CHLORIDE: 99 mmol/L — AB (ref 101–111)
CO2: 27 mmol/L (ref 22–32)
Calcium: 9.3 mg/dL (ref 8.9–10.3)
Creatinine, Ser: 1.79 mg/dL — ABNORMAL HIGH (ref 0.44–1.00)
GFR calc non Af Amer: 28 mL/min — ABNORMAL LOW (ref >60)
GFR, EST AFRICAN AMERICAN: 32 mL/min — AB (ref >60)
Glucose, Bld: 110 mg/dL — ABNORMAL HIGH (ref 65–99)
Potassium: 3.9 mmol/L (ref 3.5–5.1)
Sodium: 139 mmol/L (ref 135–145)

## 2017-05-16 LAB — CBC WITH DIFFERENTIAL/PLATELET
BASOS ABS: 0 10*3/uL (ref 0.0–0.1)
BASOS PCT: 0 %
Eosinophils Absolute: 0.2 10*3/uL (ref 0.0–0.7)
Eosinophils Relative: 2 %
HEMATOCRIT: 33.4 % — AB (ref 36.0–46.0)
HEMOGLOBIN: 10.1 g/dL — AB (ref 12.0–15.0)
LYMPHS PCT: 24 %
Lymphs Abs: 1.7 10*3/uL (ref 0.7–4.0)
MCH: 27.5 pg (ref 26.0–34.0)
MCHC: 30.2 g/dL (ref 30.0–36.0)
MCV: 91 fL (ref 78.0–100.0)
MONOS PCT: 10 %
Monocytes Absolute: 0.7 10*3/uL (ref 0.1–1.0)
NEUTROS ABS: 4.5 10*3/uL (ref 1.7–7.7)
NEUTROS PCT: 64 %
Platelets: 357 10*3/uL (ref 150–400)
RBC: 3.67 MIL/uL — ABNORMAL LOW (ref 3.87–5.11)
RDW: 16.2 % — ABNORMAL HIGH (ref 11.5–15.5)
WBC: 7 10*3/uL (ref 4.0–10.5)

## 2017-05-16 NOTE — Progress Notes (Deleted)
Location:   [facility] Room Number: 158/P Place of Service:  SNF (31) Kennon Rounds appears to have changing to an acute visit to any to get out of the discharge  Provider: Edmon Crape  PCP: Kerri Perches, MD Patient Care Team: Kerri Perches, MD as PCP - General Vickki Hearing, MD as Consulting Physician (Orthopedic Surgery) West Bali, MD as Consulting Physician (Gastroenterology)  Extended Emergency Contact Information Primary Emergency Contact: [facility] Address: [address]          [place], [place] [address] [place] Home Phone: [phone] Mobile Phone: [phone] Relation: Spouse  Code Status: Full Code Goals of care:  Advanced Directive information Advanced Directives 05/16/2017  Does Patient Have a Medical Advance Directive? Yes  Type of Advance Directive (No Data)  Does patient want to make changes to medical advance directive? No - Patient declined  Would patient like information on creating a medical advance directive? No - Patient declined     Allergies  Allergen Reactions  . Promethazine Hcl Nausea And Vomiting and Other (See Comments)    dizzy    Chief Complaint  Patient presents with  . Discharge Note    Discharge Visit    HPI:  71 y.o. female      Past Medical History:  Diagnosis Date  . Bronchitis   . Chronic back pain   . Essential hypertension   . Hyperlipidemia   . IGT (impaired glucose tolerance) 2015  . Obesity, unspecified   . Osteoarthritis   . Seasonal allergies     Past Surgical History:  Procedure Laterality Date  . CARDIAC CATHETERIZATION N/A 10/25/2014   Procedure: Left Heart Cath and Coronary Angiography;  Surgeon: Kathleene Hazel, MD;  Location: [facility] INVASIVE CV LAB;  Service: Cardiovascular;  Laterality: N/A;  . COLONOSCOPY N/A 02/09/2016   Procedure: COLONOSCOPY;  Surgeon: West Bali, MD;  Location: [facility] ENDO SUITE;  Service: Endoscopy;  Laterality: N/A;   12:30 PM  . KNEE ARTHROSCOPY Left   . TOTAL ABDOMINAL HYSTERECTOMY  1995 approx   Fibroids  . TOTAL KNEE ARTHROPLASTY Right 2011  . TOTAL KNEE ARTHROPLASTY Left 04/29/2017   Procedure: TOTAL KNEE ARTHROPLASTY;  Surgeon: Vickki Hearing, MD;  Location: [facility] ORS;  Service: Orthopedics;  Laterality: Left;      reports that  has never smoked. she has never used smokeless tobacco. She reports that she does not drink alcohol or use drugs. Social History   Socioeconomic History  . Marital status: Married    Spouse name: Not on file  . Number of children: Not on file  . Years of education: Not on file  . Highest education level: Not on file  Social Needs  . Financial resource strain: Not on file  . Food insecurity - worry: Not on file  . Food insecurity - inability: Not on file  . Transportation needs - medical: Not on file  . Transportation needs - non-medical: Not on file  Occupational History  . Occupation: retired    Associate Professor: UNEMPLOYED  Tobacco Use  . Smoking status: Never Smoker  . Smokeless tobacco: Never Used  Substance and Sexual Activity  . Alcohol use: No  . Drug use: No  . Sexual activity: Yes    Birth control/protection: Surgical  Other Topics Concern  . Not on file  Social History Narrative  . Not on file   Functional Status Survey:    Allergies  Allergen Reactions  . Promethazine  Hcl Nausea And Vomiting and Other (See Comments)    dizzy    Pertinent  Health Maintenance Due  Topic Date Due  . DEXA SCAN  06/14/2017 (Originally 08/11/2012)  . MAMMOGRAM  07/10/2018  . COLONOSCOPY  02/08/2026  . INFLUENZA VACCINE  Completed  . PNA vac Low Risk Adult  Completed    Medications: Outpatient Encounter Medications as of 05/16/2017  Medication Sig  . amLODipine (NORVASC) 5 MG tablet Take 1 tablet (5 mg total) by mouth daily.  Marland Kitchen. aspirin EC 81 MG tablet Take 81 mg by mouth 2 (two) times daily.  . ergocalciferol (VITAMIN D2) 50000 units capsule Take 1 capsule  (50,000 Units total) by mouth once a week. One capsule once weekly  . ferrous sulfate (IRON SUPPLEMENT) 325 (65 FE) MG tablet Take 325 mg by mouth 2 (two) times daily with a meal.  . HYDROcodone-acetaminophen (NORCO) 7.5-325 MG tablet Take 1 tablet by mouth every 4 (four) hours as needed for moderate pain.  . methocarbamol (ROBAXIN) 500 MG tablet Take 500 mg by mouth every 6 (six) hours as needed for muscle spasms.  . ondansetron (ZOFRAN) 4 MG tablet Take 4 mg by mouth every 8 (eight) hours as needed for nausea or vomiting.  . polyethylene glycol (MIRALAX / GLYCOLAX) packet Take 17 g by mouth daily.  Marland Kitchen. senna-docusate (SENOKOT-S) 8.6-50 MG tablet Take 1 tablet by mouth at bedtime as needed for mild constipation.  . trolamine salicylate (ASPERCREME) 10 % cream Apply 1 application topically as needed for muscle pain.  . Vitamin D, Cholecalciferol, 1000 units TABS Take 1,000 Units by mouth daily.   No facility-administered encounter medications on file as of 05/16/2017.      Review of Systems  Vitals:   05/16/17 1103  BP: 125/73  Pulse: 79  Resp: 18  Temp: 97.6 F (36.4 C)  TempSrc: Oral   There is no height or weight on file to calculate BMI. Physical Exam  Labs reviewed: Basic Metabolic Panel: Recent Labs    05/13/17 0730 05/15/17 0703 05/16/17 0615  NA 137 138 139  K 3.8 4.2 3.9  CL 97* 99* 99*  CO2 28 29 27   GLUCOSE 116* 104* 110*  BUN 21* 24* 24*  CREATININE 1.95* 1.95* 1.79*  CALCIUM 9.0 9.0 9.3   Liver Function Tests: Recent Labs    02/11/17 1423 04/25/17 0930  AST 11 17  ALT 7 12*  ALKPHOS  --  78  BILITOT 0.3 0.5  PROT 7.8 7.9  ALBUMIN  --  3.6   No results for input(s): LIPASE, AMYLASE in the last 8760 hours. No results for input(s): AMMONIA in the last 8760 hours. CBC: Recent Labs    05/03/17 0815 05/05/17 1015 05/13/17 0730 05/16/17 0615  WBC 12.5* 11.6* 10.0 7.0  NEUTROABS 8.9* 7.2  --  4.5  HGB 7.3* 8.0* 8.9* 10.1*  HCT 23.7* 26.5* 29.5*  33.4*  MCV 90.5 89.5 91.0 91.0  PLT 239 356 319 357   Cardiac Enzymes: No results for input(s): CKTOTAL, CKMB, CKMBINDEX, TROPONINI in the last 8760 hours. BNP: Invalid input(s): POCBNP CBG: No results for input(s): GLUCAP in the last 8760 hours.  Procedures and Imaging Studies During Stay: Dg Chest 2 View  Result Date: 04/25/2017 CLINICAL DATA:  Preop knee surgery EXAM: CHEST  2 VIEW COMPARISON:  04/06/2016 FINDINGS: The heart size and mediastinal contours are within normal limits. Both lungs are clear. The visualized skeletal structures are unremarkable. IMPRESSION: No active cardiopulmonary disease. Electronically Signed  By: Marlan Palauharles  Clark M.D.   On: 04/25/2017 14:09   Dg Knee Left Port  Result Date: 04/29/2017 CLINICAL DATA:  Status post total knee replacement EXAM: PORTABLE LEFT KNEE - 1-2 VIEW COMPARISON:  March 11, 2016 FINDINGS: Frontal and lateral views were obtained. There is a total knee replacement with prosthetic components well-seated. No acute fracture or dislocation. There is spurring along the anterior patella, stable. There is fluid within the joint with a drain located posterior to the patella. IMPRESSION: Femoral and tibial prosthetic components well-seated. No acute fracture or dislocation. Stable spurring along the anterior patella. Electronically Signed   By: Bretta BangWilliam  Woodruff III M.D.   On: 04/29/2017 10:56    Assessment/Plan:    Future labs/tests needed:

## 2017-05-16 NOTE — Progress Notes (Signed)
Location:   [facility] Room Number: 158/P Place of Service:  SNF [phone]) Provider:  Odessa Fleming, MD  Patient Care Team: Kerri Perches, MD as PCP - General Vickki Hearing, MD as Consulting Physician (Orthopedic Surgery) West Bali, MD as Consulting Physician (Gastroenterology)  Extended Emergency Contact Information Primary Emergency Contact: [facility] Address: [address]          [place], [place] [address] [place] Home Phone: [phone] Mobile Phone: [phone] Relation: Spouse  Code Status: Full Code  Goals of care: Advanced Directive information Advanced Directives 05/16/2017  Does Patient Have a Medical Advance Directive? Yes  Type of Advance Directive (No Data)  Does patient want to make changes to medical advance directive? No - Patient declined  Would patient like information on creating a medical advance directive? No - Patient declined     Chief Complaint  Patient presents with  . Acute Visit    F/U Renal Insufficiency    HPI:  Pt is a 71 y.o. female seen today for an acute visit for to follow-up renal insufficiency.  Patient is here for rehab after undergoing a left total knee arthroplasty with patellar tendon reinforcement.  Also has a history of hypertension osteoarthritis.  Routine lab earlier this week showed her creatinine had risen up to 1.95 with a BUN of 21-appears baseline creatinine is around 1.  Her Maxide was discontinued.  She is on Norvasc for hypertension and this actually appears stable I got a manual reading of 132/58 today.  She is doing well with therapy and actually secondary to insurance issues there is recommendation for discharge this weekend.  This is complicated somewhat with her renal function however her Maxide has been discontinued and on lab today creatinine show some improvement at 1.79 and BUN of 24-she has lost some weight since she does does not  have much of an appetite since her surgery-we have encourage fluids however.  Clinically she appears again to be doing well--     Past Medical History:  Diagnosis Date  . Bronchitis   . Chronic back pain   . Essential hypertension   . Hyperlipidemia   . IGT (impaired glucose tolerance) 2015  . Obesity, unspecified   . Osteoarthritis   . Seasonal allergies    Past Surgical History:  Procedure Laterality Date  . CARDIAC CATHETERIZATION N/A 10/25/2014   Procedure: Left Heart Cath and Coronary Angiography;  Surgeon: Kathleene Hazel, MD;  Location: [facility] INVASIVE CV LAB;  Service: Cardiovascular;  Laterality: N/A;  . COLONOSCOPY N/A 02/09/2016   Procedure: COLONOSCOPY;  Surgeon: West Bali, MD;  Location: [facility] ENDO SUITE;  Service: Endoscopy;  Laterality: N/A;  12:30 PM  . KNEE ARTHROSCOPY Left   . TOTAL ABDOMINAL HYSTERECTOMY  1995 approx   Fibroids  . TOTAL KNEE ARTHROPLASTY Right 2011  . TOTAL KNEE ARTHROPLASTY Left 04/29/2017   Procedure: TOTAL KNEE ARTHROPLASTY;  Surgeon: Vickki Hearing, MD;  Location: [facility] ORS;  Service: Orthopedics;  Laterality: Left;    Allergies  Allergen Reactions  . Promethazine Hcl Nausea And Vomiting and Other (See Comments)    dizzy    Outpatient Encounter Medications as of 05/16/2017  Medication Sig  . amLODipine (NORVASC) 5 MG tablet Take 1 tablet (5 mg total) by mouth daily.  Marland Kitchen aspirin EC 81 MG tablet Take 81 mg by mouth 2 (two) times daily.  . ergocalciferol (VITAMIN D2) 50000 units capsule Take 1 capsule (  50,000 Units total) by mouth once a week. One capsule once weekly  . ferrous sulfate (IRON SUPPLEMENT) 325 (65 FE) MG tablet Take 325 mg by mouth 2 (two) times daily with a meal.  . HYDROcodone-acetaminophen (NORCO) 7.5-325 MG tablet Take 1 tablet by mouth every 4 (four) hours as needed for moderate pain.  . methocarbamol (ROBAXIN) 500 MG tablet Take 500 mg by mouth every 6 (six) hours as needed for muscle spasms.  . ondansetron  (ZOFRAN) 4 MG tablet Take 4 mg by mouth every 8 (eight) hours as needed for nausea or vomiting.  . polyethylene glycol (MIRALAX / GLYCOLAX) packet Take 17 g by mouth daily.  Marland Kitchen senna-docusate (SENOKOT-S) 8.6-50 MG tablet Take 1 tablet by mouth at bedtime as needed for mild constipation.  . trolamine salicylate (ASPERCREME) 10 % cream Apply 1 application topically as needed for muscle pain.  . Vitamin D, Cholecalciferol, 1000 units TABS Take 1,000 Units by mouth daily.   No facility-administered encounter medications on file as of 05/16/2017.     Review of Systems   General is not complaining of any fever chills says she feels well but does not have much of an appetite.  Skin abdominal discomfort rashes or itching.  Head ears eyes nose mouth and throat no complaints visual changes or difficulty swallowing.  Respiratory is not complaining of any shortness of breath or cough.  Cardiac continues to deny any chest pain does not really have significant lower extremity edema.  GI at times has complained of nausea but apparently Zofran is helping says she still does have much of an appetite however does not complain of abdominal pain--or vomiting.  GU does not complain of dysuria. --At this point joint knee pain appears to be controlled she is not really complaining of this doing well in therapy.  Psych continues to be in very good spirits does not complain of anxiety Musculoskeletal  Immunization History  Administered Date(s) Administered  . Influenza,inj,Quad PF,6+ Mos 06/22/2013, 07/18/2014, 07/04/2015, 03/21/2016, 02/11/2017  . Pneumococcal Conjugate-13 12/27/2013  . Pneumococcal Polysaccharide-23 12/15/2012   Pertinent  Health Maintenance Due  Topic Date Due  . DEXA SCAN  06/14/2017 (Originally 08/11/2012)  . MAMMOGRAM  07/10/2018  . COLONOSCOPY  02/08/2026  . INFLUENZA VACCINE  Completed  . PNA vac Low Risk Adult  Completed   Fall Risk  02/11/2017 07/18/2016 01/02/2016 07/04/2015  07/18/2014  Falls in the past year? No Yes No No No  Number falls in past yr: - 1 - - -  Injury with Fall? - No - - -  Risk for fall due to : - - - - Impaired balance/gait  Follow up - Falls evaluation completed;Falls prevention discussed - - -  Comment - reveals that the patient accidentaly stumbled - - -   Functional Status Survey:    Vitals:   05/16/17 1103  BP: 125/73  Pulse: 79  Resp: 18  Temp: 97.6 F (36.4 C)  TempSrc: Oral  Of note manual blood pressure was 132/58  Physical Exam In general this is a very pleasant elderly female in no distress.  Her skin is warm and dry.  Eyes visual acuity appears grossly intact sclera and conjunctive are clear.  Oropharynx is clear mucous membranes moist.  Chest is clear to auscultation there is no labored breathing.  Heart is regular rate and rhythm without murmur gallop or rub she does not really have significant lower extremity edema pedal pulses are intact bilaterally.  Abdomen is obese soft nontender  with positive bowel sounds.  Musculoskeletal is ambulatory with a walker does have a leg immobilizer on left leg strength appears to be intact and baseline upper extremities and right lower extremity again left lower extremity is in an immobilizer   neurologic is grossly intact her speech is clear.  Psych she is alert and oriented pleasant and appropriate Labs reviewed: Recent Labs    05/13/17 0730 05/15/17 0703 05/16/17 0615  NA 137 138 139  K 3.8 4.2 3.9  CL 97* 99* 99*  CO2 28 29 27   GLUCOSE 116* 104* 110*  BUN 21* 24* 24*  CREATININE 1.95* 1.95* 1.79*  CALCIUM 9.0 9.0 9.3   Recent Labs    02/11/17 1423 04/25/17 0930  AST 11 17  ALT 7 12*  ALKPHOS  --  78  BILITOT 0.3 0.5  PROT 7.8 7.9  ALBUMIN  --  3.6   Recent Labs    05/03/17 0815 05/05/17 1015 05/13/17 0730 05/16/17 0615  WBC 12.5* 11.6* 10.0 7.0  NEUTROABS 8.9* 7.2  --  4.5  HGB 7.3* 8.0* 8.9* 10.1*  HCT 23.7* 26.5* 29.5* 33.4*  MCV 90.5  89.5 91.0 91.0  PLT 239 356 319 357   Lab Results  Component Value Date   TSH 1.72 02/11/2017   Lab Results  Component Value Date   HGBA1C 6.1 (H) 02/11/2017   Lab Results  Component Value Date   CHOL 172 02/11/2017   HDL 61 02/11/2017   LDLCALC 104 07/04/2015   TRIG 93 02/11/2017   CHOLHDL 2.8 02/11/2017    Significant Diagnostic Results in last 30 days:  Dg Chest 2 View  Result Date: 04/25/2017 CLINICAL DATA:  Preop knee surgery EXAM: CHEST  2 VIEW COMPARISON:  04/06/2016 FINDINGS: The heart size and mediastinal contours are within normal limits. Both lungs are clear. The visualized skeletal structures are unremarkable. IMPRESSION: No active cardiopulmonary disease. Electronically Signed   By: Marlan Palauharles  Clark M.D.   On: 04/25/2017 14:09   Dg Knee Left Port  Result Date: 04/29/2017 CLINICAL DATA:  Status post total knee replacement EXAM: PORTABLE LEFT KNEE - 1-2 VIEW COMPARISON:  March 11, 2016 FINDINGS: Frontal and lateral views were obtained. There is a total knee replacement with prosthetic components well-seated. No acute fracture or dislocation. There is spurring along the anterior patella, stable. There is fluid within the joint with a drain located posterior to the patella. IMPRESSION: Femoral and tibial prosthetic components well-seated. No acute fracture or dislocation. Stable spurring along the anterior patella. Electronically Signed   By: Bretta BangWilliam  Woodruff III M.D.   On: 04/29/2017 10:56    Assessment/Plan  #1-renal insufficiency-creatinine has shown some improvement off the Maxide- there are plans apparently for discharge over the weekend- will order a renal ultrasound to rule out hydronephrosis-also will update the labs over the weekend to monitor her creatinine-clinically she appears to be doing well we are encouraging fluids to help with the renal function- will await further updated labs- at this point discharge is somewhat in flux but clinically she appears to be  doing well.  2.  Hypertension despite being off the Maxzide this appears stable as noted above continue to monitor continue Norvasc.  3.  History of left knee replacement--she appears again to be doing well with this she does go home will need PT and OT as well as a walker to help with ambulation.  4.-  Anemia most likely postop--this has improved with a hemoglobin of 10.1 on lab done today she  is on iron--.  [id number]

## 2017-05-18 ENCOUNTER — Non-Acute Institutional Stay (SKILLED_NURSING_FACILITY): Payer: Medicare HMO | Admitting: Internal Medicine

## 2017-05-18 ENCOUNTER — Encounter (HOSPITAL_COMMUNITY)
Admission: RE | Admit: 2017-05-18 | Discharge: 2017-05-18 | Disposition: A | Discharge status: Home / Self Care | Payer: Medicare HMO | Source: Skilled Nursing Facility | Attending: *Deleted | Admitting: *Deleted

## 2017-05-18 DIAGNOSIS — I10 Essential (primary) hypertension: Secondary | ICD-10-CM

## 2017-05-18 DIAGNOSIS — D649 Anemia, unspecified: Secondary | ICD-10-CM

## 2017-05-18 DIAGNOSIS — N289 Disorder of kidney and ureter, unspecified: Secondary | ICD-10-CM | POA: Diagnosis not present

## 2017-05-18 DIAGNOSIS — Z96652 Presence of left artificial knee joint: Secondary | ICD-10-CM | POA: Diagnosis not present

## 2017-05-18 LAB — BASIC METABOLIC PANEL
ANION GAP: 11 (ref 5–15)
BUN: 25 mg/dL — ABNORMAL HIGH (ref 6–20)
CO2: 27 mmol/L (ref 22–32)
Calcium: 8.9 mg/dL (ref 8.9–10.3)
Chloride: 101 mmol/L (ref 101–111)
Creatinine, Ser: 1.69 mg/dL — ABNORMAL HIGH (ref 0.44–1.00)
GFR calc non Af Amer: 30 mL/min — ABNORMAL LOW (ref >60)
GFR, EST AFRICAN AMERICAN: 35 mL/min — AB (ref >60)
GLUCOSE: 108 mg/dL — AB (ref 65–99)
POTASSIUM: 4.3 mmol/L (ref 3.5–5.1)
Sodium: 139 mmol/L (ref 135–145)

## 2017-05-19 ENCOUNTER — Encounter: Payer: Self-pay | Admitting: Internal Medicine

## 2017-05-19 DIAGNOSIS — I10 Essential (primary) hypertension: Secondary | ICD-10-CM | POA: Diagnosis not present

## 2017-05-19 DIAGNOSIS — Z96643 Presence of artificial hip joint, bilateral: Secondary | ICD-10-CM | POA: Diagnosis not present

## 2017-05-19 DIAGNOSIS — Z6841 Body Mass Index (BMI) 40.0 and over, adult: Secondary | ICD-10-CM | POA: Diagnosis not present

## 2017-05-19 DIAGNOSIS — E669 Obesity, unspecified: Secondary | ICD-10-CM | POA: Diagnosis not present

## 2017-05-19 DIAGNOSIS — Z471 Aftercare following joint replacement surgery: Secondary | ICD-10-CM | POA: Diagnosis not present

## 2017-05-19 NOTE — Progress Notes (Signed)
This is a discharge note.  Level of care skilled.  Facility is [facility].  Chief complaint discharge  Note  History of present illness.  Patient is a pleasant 71 year old female seen today for discharge from facility today.  She was here for a leftt knee replacement secondary to end-stage osteoarthritis she tolerated the procedure well there not been really any complications in regard to this she is using a walker and will benefit from continued PT and OT at home.  Postop she did have some anemia as well but this appears to have improved with a hemoglobin of 10.1 on lab done earlier this week.  She did have increased renal insufficiency with a creatinine going up to 1.95 on the lab done 5 days ago.  Her Maxzide was discontinued and this appears to have a beneficial effect creatinine is gradually trended down it is 1.69 today.  We also ordered a renal ultrasound which did not show any acute process or hydronephrosis.  Blood pressures appear to be stable despite being off the Maxide I got 130/70 this afternoon this appears to be relatively baseline I see previous reading 117/56.  She does continue on Norvasc 5 mg a day  She continues to receive Norco as needed for pain which appears to be effective.  She will be going home with her husband who is very supportive also will need PT and OT to continue her rehab.  Her Maxzide was discontinued and this appears to have a beneficial effect creatinine is gradually trended down it is 1.69 today.  We also ordered a renal ultrasound which did not show any acute process or hydronephrosis.  Blood pressures appear to be stable despite being off the Maxide I got 130/70 this afternoon this appears to be relatively baseline I see previous reading 117/56.  She does continue on Norvasc 5 mg a day  She continues to receive Norco as needed for pain which appears to be effective.  She will be going home with her husband who is very supportive  also will need PT and OT to continue her rehab.    Past Medical History:  Diagnosis Date  . Bronchitis   . Chronic back pain   . Essential hypertension   . Hyperlipidemia   . IGT (impaired glucose tolerance) 2015  . Obesity, unspecified   . Osteoarthritis   . Seasonal allergies         Past Surgical History:  Procedure Laterality Date  . CARDIAC CATHETERIZATION N/A 10/25/2014   Procedure: Left Heart Cath and Coronary Angiography;  Surgeon: Kathleene Hazelhristopher D McAlhany, MD;  [facility];  Service: Cardiovascular;  Laterality: N/A;  . COLONOSCOPY N/A 02/09/2016   Procedure: COLONOSCOPY;  Surgeon: West BaliSandi L Fields, MD;  [facility]: [facility];  Service: Endoscopy;  Laterality: N/A;  12:30 PM  . KNEE ARTHROSCOPY Left   . TOTAL ABDOMINAL HYSTERECTOMY  1995 approx   Fibroids  . TOTAL KNEE ARTHROPLASTY Right 2011  . TOTAL KNEE ARTHROPLASTY Left 04/29/2017   Procedure: TOTAL KNEE ARTHROPLASTY;  Surgeon: Vickki HearingHarrison, Stanley E, MD;  Location: [facility] ORS;  Service: Orthopedics;  Laterality: Left;         Allergies  Allergen Reactions  . Promethazine Hcl Nausea And Vomiting and Other (See Comments)    dizzy         8  Medication Sig  . amLODipine (NORVASC) 5 MG tablet Take 1 tablet (5 mg total) by mouth daily.  Marland Kitchen. aspirin EC 81 MG tablet  Take 81 mg by mouth 2 (two) times daily.  . ergocalciferol (VITAMIN D2) 50000 units capsule Take 1 capsule (50,000 Units total) by mouth once a week. One capsule once weekly  . ferrous sulfate (IRON SUPPLEMENT) 325 (65 FE) MG tablet Take 325 mg by mouth 2 (two) times daily with a meal.  . HYDROcodone-acetaminophen (NORCO) 7.5-325 MG tablet Take 1 tablet by mouth every 4 (four) hours as needed for moderate pain.  . methocarbamol (ROBAXIN) 500 MG tablet Take 500 mg by mouth every 6 (six) hours as needed for muscle spasms.  . ondansetron (ZOFRAN) 4 MG tablet Take 4 mg by mouth every 8 (eight) hours as needed for nausea or  vomiting.  . polyethylene glycol (MIRALAX / GLYCOLAX) packet Take 17 g by mouth daily.  Marland Kitchen. senna-docusate (SENOKOT-S) 8.6-50 MG tablet Take 1 tablet by mouth at bedtime as needed for mild constipation.  . trolamine salicylate (ASPERCREME) 10 % cream Apply 1 application topically as needed for muscle pain.  . Vitamin D, Cholecalciferol, 1000 units TABS Take 1,000 Units by mouth daily.  . [DISCONTINUED] Cholecalciferol (VITAMIN D3 ADULT GUMMIES PO) Take 1 tablet by mouth daily.  .       Review of systems.  General she is not complaining of any fever or chills.  Skin does not complain of rashes or itching surgical site is currently covered by an immobilizer.  Head ears eyes nose mouth and throat not complaining of any sore throat or visual changes.  Respiratory denies any shortness of breath has occasional cough but she says this is not anything abnormal.  Cardiac is not complaining of any chest pain or palpitations does not appear to have significant lower extremity edema.  GU is not complaining of dysuria.  or urinary retention  Musculoskeletal--says her joint pain knee pain is controlled by the Norco.  Neurologic does not complain of dizziness headache or numbness.  Psych continues to be in good spirits does not complain of anxiety or depression.  Physical exam.  Temperature 97.8 pulse 75 respirations 18 blood pressure taken manually--130/70.  In general this is a pleasant obese elderly female no distress.  Her skin is warm and dry.  Eyes visual acuity appears grossly intact her speech is clear.  Oropharynx is clear mucous membranes moist.  Chest clear to auscultation there is no labored breathing.  Heart is regular rate and rhythm without murmur gallop or rub she does not have significant lower extremity edema again right leg is covered with immobilizer so limited exam in that regard-pedal pulses are intact bilaterally.  Abdomen is soft nontender with positive bowel  sounds.  Musculoskeletal does have an immobilizer on her rleft leg is able to move her other extremities at baseline appears to be doing well walking with a walker but would benefit from therapy.  Neurologic is grossly intact her speech is clear no lateralizing findings.  Psych she is alert and oriented very pleasant and appropriate.  Labs.  May 18, 2017.  Sodium 139 potassium 4.3 BUN 25 creatinine 1.69.  Creatinine was up to 1.95 on December 18 but again passed moderated.  May 16, 2017.  WBC 7.0 hemoglobin 10.1 platelets 357.  Assessment and plan.  History of left knee replacement-she appears to be doing well in this regards-she will need PT and OT when she goes home as well as a walker to assist with ambulation-also will have orthopedic follow-up.  Pain appears controlled with as needed Norco.  2.  History of renal insufficiency creatinine  continues to show some improvement again renal ultrasound did not show any acute process-this will need follow-up by primary care provider with follow-up labs by home health next week primary care provider notified of results will order home health to check this on Wednesday, December 26.  3.  Hypertension this appears stable despite being off the Maxzide--is on Norvasc.  4.  Anemia most likely postop this appears to be improving at 10.1 will have this rechecked as well next week.  Again she appears to be doing quite well her renal function will need to be followed-but clinically appears to be quite stable she will need PT and OT at home as well as home health support.  [id number] note greater than 30 minutes spent on this discharge summary-greater than 50% of time spent coordinating a plan of care for numerous diagnoses

## 2017-05-19 NOTE — Progress Notes (Signed)
.Location:   [facility] Room Number: 158/P Place of Service:  SNF (31) Provider:  Odessa FlemingArlo Annely Sliva  Fuentes, Patricia E, MD  Patient Care Team: Kerri PerchesSimpson, Patricia E, MD as PCP - General Vickki HearingHarrison, Stanley E, MD as Consulting Physician (Orthopedic Surgery) West BaliFields, Sandi L, MD as Consulting Physician (Gastroenterology)  Extended Emergency Contact Information Primary Emergency Contact: [facility],Edward Address: [address]          [place], [place] [address] [place] Home Phone: [phone] Mobile Phone: [phone] Relation: Spouse  Code Status:  Full Code Goals of care: Advanced Directive information Advanced Directives 05/14/2017  Does Patient Have a Medical Advance Directive? Yes  Type of Advance Directive (No Data)  Does patient want to make changes to medical advance directive? No - Patient declined  Would patient like information on creating a medical advance directive? No - Patient declined         Chief Complaint   Discharge note.  History of present illness.  Is a pleasant 71 year old female seen today for discharge from facility.  She was here for rehab after undergoing a right total knee replacement secondary to end-stage osteoarthritis.  She tolerated the surgery well hemoglobin did drop postop but appears to be rebounding at 10.1.  Pain is controlled with Norco.  It was noted on a routine lab that her creatinine was rising up to 5.95 several days ago-her Maxzide was discontinued in creatinine appears to be trending down it is 1.69 today.  We also ordered a renal ultrasound which was negative for any hydronephrosis.  Despite being off Maxzide her blood pressure is stable I got 130/70 manually previous blood pressure 117/56- she is on Norvasc 5 mg a day.  She will be going home with her husband who is very supportive and would benefit from continued PT and OT.  Past Medical History:  Diagnosis Date  . Bronchitis    . Chronic back pain   . Essential hypertension   . Hyperlipidemia   . IGT (impaired glucose tolerance) 2015  . Obesity, unspecified   . Osteoarthritis   . Seasonal allergies         Past Surgical History:  Procedure Laterality Date  . CARDIAC CATHETERIZATION N/A 10/25/2014   Procedure: Left Heart Cath and Coronary Angiography;  Surgeon: Kathleene Hazelhristopher D McAlhany, MD;  [facility];  Service: Cardiovascular;  Laterality: N/A;  . COLONOSCOPY N/A 02/09/2016   Procedure: COLONOSCOPY;  Surgeon: West BaliSandi L Fields, MD;  [facility];  Service: Endoscopy;  Laterality: N/A;  12:30 PM  . KNEE ARTHROSCOPY Left   . TOTAL ABDOMINAL HYSTERECTOMY  1995 approx   Fibroids  . TOTAL KNEE ARTHROPLASTY Right 2011  . TOTAL KNEE ARTHROPLASTY Left 04/29/2017   Procedure: TOTAL KNEE ARTHROPLASTY;  Surgeon: Vickki HearingHarrison, Stanley E, MD;  Location: [facility] ORS;  Service: Orthopedics;  Laterality: Left;         Allergies  Allergen Reactions  . Promethazine Hcl Nausea And Vomiting and Other (See Comments)    dizzy        Outpatient Encounter Medications as of 05/14/2017  Medication Sig  . amLODipine (NORVASC) 5 MG tablet Take 1 tablet (5 mg total) by mouth daily.  Marland Kitchen. aspirin EC 81 MG tablet Take 81 mg by mouth 2 (two) times daily.  . ergocalciferol (VITAMIN D2) 50000 units capsule Take 1 capsule (50,000 Units total) by mouth once a week. One capsule once weekly  . ferrous sulfate (IRON  SUPPLEMENT) 325 (65 FE) MG tablet Take 325 mg by mouth 2 (two) times daily with a meal.  . HYDROcodone-acetaminophen (NORCO) 7.5-325 MG tablet Take 1 tablet by mouth every 4 (four) hours as needed for moderate pain.  . methocarbamol (ROBAXIN) 500 MG tablet Take 500 mg by mouth every 6 (six) hours as needed for muscle spasms.  . ondansetron (ZOFRAN) 4 MG tablet Take 4 mg by mouth every 8 (eight) hours as needed for nausea or vomiting.  . polyethylene glycol (MIRALAX / GLYCOLAX) packet Take 17 g  by mouth daily.  Marland Kitchen. senna-docusate (SENOKOT-S) 8.6-50 MG tablet Take 1 tablet by mouth at bedtime as needed for mild constipation.  . trolamine salicylate (ASPERCREME) 10 % cream Apply 1 application topically as needed for muscle pain.  . Vitamin D, Cholecalciferol, 1000 units TABS Take 1,000 Units by mouth daily.  . [DISCONTINUED] Cholecalciferol (VITAMIN D3 ADULT GUMMIES PO) Take 1 tablet by mouth daily.  . [DISCONTINUED] triamterene-hydrochlorothiazide (MAXZIDE-25) 37.5-25 MG tablet TAKE ONE TABLET BY MOUTH ONCE DAILY.   No facility-administered encounter medications on file as of 05/14/2017.     Review of Systems   General no complaints of fever chills.  Skin does not complain of rashes or itching surgical site currently covered by an immobilizer.  Head ears eyes nose mouth and throat is not complaining of any sore throat or visual changes.  Respiratory denies any shortness of breath or cough.  Cardiac is not complaining of any chest pain does not really have significant lower extremity edema that is visible again she has immobilizer on her right leg.  GI is not complaining of any abdominal pain nausea vomiting diarrhea constipation-says she still does not have a very good appetite and has lost some weight during her stay here.  GU does not complain of dysuria.  Musculoskeletal-says her joint pain knee discomfort is covered adequately by the Norco.  Neurologic does not complain of dizziness headache or numbness.  Psych does not complain of any anxiety or depression continues to be very gregarious pleasant and upbeat.  Physical exam.

## 2017-05-22 DIAGNOSIS — Z471 Aftercare following joint replacement surgery: Secondary | ICD-10-CM | POA: Diagnosis not present

## 2017-05-22 DIAGNOSIS — Z96643 Presence of artificial hip joint, bilateral: Secondary | ICD-10-CM | POA: Diagnosis not present

## 2017-05-22 DIAGNOSIS — E669 Obesity, unspecified: Secondary | ICD-10-CM | POA: Diagnosis not present

## 2017-05-22 DIAGNOSIS — I10 Essential (primary) hypertension: Secondary | ICD-10-CM | POA: Diagnosis not present

## 2017-05-22 DIAGNOSIS — Z6841 Body Mass Index (BMI) 40.0 and over, adult: Secondary | ICD-10-CM | POA: Diagnosis not present

## 2017-05-26 ENCOUNTER — Telehealth: Payer: Self-pay | Admitting: Radiology

## 2017-05-26 DIAGNOSIS — Z471 Aftercare following joint replacement surgery: Secondary | ICD-10-CM | POA: Diagnosis not present

## 2017-05-26 DIAGNOSIS — I10 Essential (primary) hypertension: Secondary | ICD-10-CM | POA: Diagnosis not present

## 2017-05-26 DIAGNOSIS — Z6841 Body Mass Index (BMI) 40.0 and over, adult: Secondary | ICD-10-CM | POA: Diagnosis not present

## 2017-05-26 DIAGNOSIS — Z96643 Presence of artificial hip joint, bilateral: Secondary | ICD-10-CM | POA: Diagnosis not present

## 2017-05-26 DIAGNOSIS — E669 Obesity, unspecified: Secondary | ICD-10-CM | POA: Diagnosis not present

## 2017-05-26 NOTE — Telephone Encounter (Signed)
Patient called, she is working with home health physical therapy, but nursing home told her a nurse was to come out as well but has not. I have advised her to call the company to ck on the status of the nurse. Patient was provided number to call [facility], this is the company that has provided the PT for her.

## 2017-05-28 DIAGNOSIS — Z471 Aftercare following joint replacement surgery: Secondary | ICD-10-CM | POA: Diagnosis not present

## 2017-05-28 DIAGNOSIS — E669 Obesity, unspecified: Secondary | ICD-10-CM | POA: Diagnosis not present

## 2017-05-28 DIAGNOSIS — Z6841 Body Mass Index (BMI) 40.0 and over, adult: Secondary | ICD-10-CM | POA: Diagnosis not present

## 2017-05-28 DIAGNOSIS — I10 Essential (primary) hypertension: Secondary | ICD-10-CM | POA: Diagnosis not present

## 2017-05-28 DIAGNOSIS — Z96643 Presence of artificial hip joint, bilateral: Secondary | ICD-10-CM | POA: Diagnosis not present

## 2017-05-29 ENCOUNTER — Ambulatory Visit (INDEPENDENT_AMBULATORY_CARE_PROVIDER_SITE_OTHER): Payer: Medicare HMO | Admitting: Family Medicine

## 2017-05-29 ENCOUNTER — Encounter: Payer: Self-pay | Admitting: Family Medicine

## 2017-05-29 ENCOUNTER — Other Ambulatory Visit (HOSPITAL_COMMUNITY)
Admission: RE | Admit: 2017-05-29 | Discharge: 2017-05-29 | Disposition: A | Discharge status: Home / Self Care | Payer: Medicare HMO | Source: Ambulatory Visit | Attending: Family Medicine | Admitting: Family Medicine

## 2017-05-29 VITALS — BP 154/80 | HR 74 | Resp 16 | Ht 62.0 in | Wt 248.0 lb

## 2017-05-29 DIAGNOSIS — D539 Nutritional anemia, unspecified: Secondary | ICD-10-CM | POA: Insufficient documentation

## 2017-05-29 DIAGNOSIS — E559 Vitamin D deficiency, unspecified: Secondary | ICD-10-CM

## 2017-05-29 DIAGNOSIS — E8881 Metabolic syndrome: Secondary | ICD-10-CM

## 2017-05-29 DIAGNOSIS — I10 Essential (primary) hypertension: Secondary | ICD-10-CM

## 2017-05-29 DIAGNOSIS — R5383 Other fatigue: Secondary | ICD-10-CM

## 2017-05-29 DIAGNOSIS — E785 Hyperlipidemia, unspecified: Secondary | ICD-10-CM

## 2017-05-29 DIAGNOSIS — D649 Anemia, unspecified: Secondary | ICD-10-CM | POA: Diagnosis not present

## 2017-05-29 DIAGNOSIS — M1712 Unilateral primary osteoarthritis, left knee: Secondary | ICD-10-CM

## 2017-05-29 LAB — CBC
HCT: 32.7 % — ABNORMAL LOW (ref 36.0–46.0)
Hemoglobin: 10 g/dL — ABNORMAL LOW (ref 12.0–15.0)
MCH: 27.9 pg (ref 26.0–34.0)
MCHC: 30.6 g/dL (ref 30.0–36.0)
MCV: 91.3 fL (ref 78.0–100.0)
Platelets: 191 10*3/uL (ref 150–400)
RBC: 3.58 MIL/uL — AB (ref 3.87–5.11)
RDW: 14.9 % (ref 11.5–15.5)
WBC: 5 10*3/uL (ref 4.0–10.5)

## 2017-05-29 LAB — BASIC METABOLIC PANEL
Anion gap: 8 (ref 5–15)
BUN: 11 mg/dL (ref 6–20)
CHLORIDE: 100 mmol/L — AB (ref 101–111)
CO2: 31 mmol/L (ref 22–32)
Calcium: 8.9 mg/dL (ref 8.9–10.3)
Creatinine, Ser: 0.97 mg/dL (ref 0.44–1.00)
GFR calc non Af Amer: 58 mL/min — ABNORMAL LOW (ref >60)
Glucose, Bld: 102 mg/dL — ABNORMAL HIGH (ref 65–99)
Potassium: 3.4 mmol/L — ABNORMAL LOW (ref 3.5–5.1)
SODIUM: 139 mmol/L (ref 135–145)

## 2017-05-29 MED ORDER — AMLODIPINE BESYLATE 2.5 MG PO TABS: 2.5000 mg | ORAL_TABLET | Freq: Every day | ORAL | 3 refills | Status: DC

## 2017-05-29 NOTE — Patient Instructions (Signed)
F/u first week in February, call if you need me sooner  Blood pressure is high, new additional dose of amlodipine is 2.5 mg so TOTAL every day is 7.5 mg , take 5 mg AND 2.5 mg tablet, may take one in the morning, and one at bedtime, 12 hours apart  Increase fiber and vegetable in diet and water  Resume iron one tablet twice daily for anemia   Labs at hospital today stat, CBC , and chem 7 and eGFR, we will call with results if you need to go to the ED

## 2017-05-30 ENCOUNTER — Telehealth: Payer: Self-pay

## 2017-05-30 DIAGNOSIS — Z471 Aftercare following joint replacement surgery: Secondary | ICD-10-CM | POA: Diagnosis not present

## 2017-05-30 DIAGNOSIS — Z6841 Body Mass Index (BMI) 40.0 and over, adult: Secondary | ICD-10-CM | POA: Diagnosis not present

## 2017-05-30 DIAGNOSIS — Z96643 Presence of artificial hip joint, bilateral: Secondary | ICD-10-CM | POA: Diagnosis not present

## 2017-05-30 DIAGNOSIS — E669 Obesity, unspecified: Secondary | ICD-10-CM | POA: Diagnosis not present

## 2017-05-30 DIAGNOSIS — D649 Anemia, unspecified: Secondary | ICD-10-CM

## 2017-05-30 DIAGNOSIS — I10 Essential (primary) hypertension: Secondary | ICD-10-CM | POA: Diagnosis not present

## 2017-05-30 NOTE — Telephone Encounter (Signed)
-----   Message from Kerri PerchesMargaret E Simpson, MD sent at 05/29/2017 12:19 PM EST ----- pls add iron and ferritin

## 2017-06-01 ENCOUNTER — Encounter: Payer: Self-pay | Admitting: Family Medicine

## 2017-06-01 NOTE — Assessment & Plan Note (Signed)
The increased risk of cardiovascular disease associated with this diagnosis, and the need to consistently work on lifestyle to change this is discussed. Following  a  heart healthy diet ,commitment to 30 minutes of exercise at least 5 days per week, as well as control of blood sugar and cholesterol , and achieving a healthy weight are all the areas to be addressed .  

## 2017-06-01 NOTE — Progress Notes (Signed)
Patricia Fuentes     MRN: [id number]      DOB: Nov 07, 1945   HPI Patricia Fuentes is here for follow up of recent discharge from the SNF following left total knee arthroplasty on 04/28/2017. She states she has not had much of an appetite and has not had much energy  ROS Denies recent fever or chills. Denies sinus pressure, nasal congestion, ear pain or sore throat. Denies chest congestion, productive cough or wheezing. Denies chest pains, palpitations and leg swelling Denies abdominal pain, nausea, vomiting,diarrhea or constipation.   Denies dysuria, frequency, hesitancy or incontinence. Denies joint pain, swelling and limitation in mobility. Denies headaches, seizures, numbness, or tingling. Denies depression, anxiety or insomnia. Denies skin break down or rash.   PE  BP (!) 154/80   Pulse 74   Resp 16   Ht 5\' 2"  (1.575 m)   Wt 248 lb (112.5 kg)   SpO2 96%   BMI 45.36 kg/m   Patient alert and oriented and in no cardiopulmonary distress.  HEENT: No facial asymmetry, EOMI,   oropharynx pink and moist.  Neck supple no JVD, no mass.  Chest: Clear to auscultation bilaterally.  CVS: S1, S2 no murmurs, no S3.Regular rate.  ABD: Soft non tender.   Ext: No edema  WU:[id number]  ROM spine, shoulders, hips and knees.  Skin: Intact, no ulcerations or rash noted.  Psych: Good eye contact, normal affect. Memory intact not anxious or depressed appearing.  CNS: CN 2-12 intact, power,  normal throughout.no focal deficits noted.   Assessment & Plan Anemia Needs to commit to twice daily iron to improve Hb  Fatigue Chronic and unchanged   Hyperlipidemia LDL goal <100 Hyperlipidemia:Low fat diet discussed and encouraged.   Lipid Panel  Lab Results  Component Value Date   CHOL 172 02/11/2017   HDL 61 02/11/2017   LDLCALC 104 07/04/2015   TRIG 93 02/11/2017   CHOLHDL 2.8 02/11/2017       Morbid obesity  Improved Patient re-educated about  the importance of  commitment to a  minimum of 150 minutes of exercise per week.  The importance of healthy food choices with portion control discussed. Encouraged to start a food diary, count calories and to consider  joining a support group. Sample diet sheets offered. Goals set by the patient for the next several months.   Weight /BMI 05/29/2017 05/02/2017 04/25/2017  WEIGHT 248 lb 263 lb 256 lb 3.2 oz  HEIGHT 5\' 2"  - 5\' 2"   BMI 45.36 kg/m2 48.1 kg/m2 46.86 kg/m2      Vitamin D deficiency Updated lab needed at/ before next visit.   Primary osteoarthritis of left knee Recently had knee replacement   Metabolic syndrome X The increased risk of cardiovascular disease associated with this diagnosis, and the need to consistently work on lifestyle to change this is discussed. Following  a  heart healthy diet ,commitment to 30 minutes of exercise at least 5 days per week, as well as control of blood sugar and cholesterol , and achieving a healthy weight are all the areas to be addressed .   Essential hypertension Uncontrolled, add amlodipine 2.5 mg DASH diet and commitment to daily physical activity for a minimum of 30 minutes discussed and encouraged, as a part of hypertension management. The importance of attaining a healthy weight is also discussed.  BP/Weight 05/29/2017 05/18/2017 05/16/2017 05/06/2017 05/02/2017 05/02/2017 04/25/2017  Systolic BP 154 130 125 121 106 117 158  Diastolic BP 80 70 73 74 61  51 73  Wt. (Lbs) 248 - - - 263 - 256.2  BMI 45.36 - - - 48.1 - 46.86

## 2017-06-01 NOTE — Assessment & Plan Note (Signed)
Hyperlipidemia:Low fat diet discussed and encouraged.   Lipid Panel  Lab Results  Component Value Date   CHOL 172 02/11/2017   HDL 61 02/11/2017   LDLCALC 104 07/04/2015   TRIG 93 02/11/2017   CHOLHDL 2.8 02/11/2017

## 2017-06-01 NOTE — Assessment & Plan Note (Signed)
Recently had knee replacement

## 2017-06-01 NOTE — Assessment & Plan Note (Signed)
Needs to commit to twice daily iron to improve Hb

## 2017-06-01 NOTE — Assessment & Plan Note (Signed)
Improved Patient re-educated about  the importance of commitment to a  minimum of 150 minutes of exercise per week.  The importance of healthy food choices with portion control discussed. Encouraged to start a food diary, count calories and to consider  joining a support group. Sample diet sheets offered. Goals set by the patient for the next several months.   Weight /BMI 05/29/2017 05/02/2017 04/25/2017  WEIGHT 248 lb 263 lb 256 lb 3.2 oz  HEIGHT 5\' 2"  - 5\' 2"   BMI 45.36 kg/m2 48.1 kg/m2 46.86 kg/m2

## 2017-06-01 NOTE — Assessment & Plan Note (Signed)
Chronic and unchanged ?

## 2017-06-01 NOTE — Assessment & Plan Note (Signed)
Updated lab needed at/ before next visit.   

## 2017-06-01 NOTE — Assessment & Plan Note (Signed)
Uncontrolled, add amlodipine 2.5 mg DASH diet and commitment to daily physical activity for a minimum of 30 minutes discussed and encouraged, as a part of hypertension management. The importance of attaining a healthy weight is also discussed.  BP/Weight 05/29/2017 05/18/2017 05/16/2017 05/06/2017 05/02/2017 05/02/2017 04/25/2017  Systolic BP 154 130 125 121 106 117 158  Diastolic BP 80 70 73 74 61 51 73  Wt. (Lbs) 248 - - - 263 - 256.2  BMI 45.36 - - - 48.1 - 46.86

## 2017-06-02 ENCOUNTER — Telehealth: Payer: Self-pay | Admitting: Orthopedic Surgery

## 2017-06-02 DIAGNOSIS — E669 Obesity, unspecified: Secondary | ICD-10-CM | POA: Diagnosis not present

## 2017-06-02 DIAGNOSIS — I10 Essential (primary) hypertension: Secondary | ICD-10-CM | POA: Diagnosis not present

## 2017-06-02 DIAGNOSIS — Z96643 Presence of artificial hip joint, bilateral: Secondary | ICD-10-CM | POA: Diagnosis not present

## 2017-06-02 DIAGNOSIS — Z471 Aftercare following joint replacement surgery: Secondary | ICD-10-CM | POA: Diagnosis not present

## 2017-06-02 DIAGNOSIS — Z6841 Body Mass Index (BMI) 40.0 and over, adult: Secondary | ICD-10-CM | POA: Diagnosis not present

## 2017-06-02 NOTE — Telephone Encounter (Signed)
I have called to provide the VO per Dr Romeo AppleHarrison.

## 2017-06-02 NOTE — Telephone Encounter (Signed)
Ms. Jenelle MagesHairston had total need done on 04-29-17  Mallory from [facility] needs verbal order for OT one time a week for 3 weeks.  Mallory's phone number is [phone]  Thanks

## 2017-06-04 DIAGNOSIS — Z96643 Presence of artificial hip joint, bilateral: Secondary | ICD-10-CM | POA: Diagnosis not present

## 2017-06-04 DIAGNOSIS — Z471 Aftercare following joint replacement surgery: Secondary | ICD-10-CM | POA: Diagnosis not present

## 2017-06-04 DIAGNOSIS — Z6841 Body Mass Index (BMI) 40.0 and over, adult: Secondary | ICD-10-CM | POA: Diagnosis not present

## 2017-06-04 DIAGNOSIS — I10 Essential (primary) hypertension: Secondary | ICD-10-CM | POA: Diagnosis not present

## 2017-06-04 DIAGNOSIS — E669 Obesity, unspecified: Secondary | ICD-10-CM | POA: Diagnosis not present

## 2017-06-06 DIAGNOSIS — I10 Essential (primary) hypertension: Secondary | ICD-10-CM | POA: Diagnosis not present

## 2017-06-06 DIAGNOSIS — Z471 Aftercare following joint replacement surgery: Secondary | ICD-10-CM | POA: Diagnosis not present

## 2017-06-06 DIAGNOSIS — Z96643 Presence of artificial hip joint, bilateral: Secondary | ICD-10-CM | POA: Diagnosis not present

## 2017-06-06 DIAGNOSIS — E669 Obesity, unspecified: Secondary | ICD-10-CM | POA: Diagnosis not present

## 2017-06-06 DIAGNOSIS — Z6841 Body Mass Index (BMI) 40.0 and over, adult: Secondary | ICD-10-CM | POA: Diagnosis not present

## 2017-06-09 DIAGNOSIS — Z96643 Presence of artificial hip joint, bilateral: Secondary | ICD-10-CM | POA: Diagnosis not present

## 2017-06-09 DIAGNOSIS — E669 Obesity, unspecified: Secondary | ICD-10-CM | POA: Diagnosis not present

## 2017-06-09 DIAGNOSIS — I10 Essential (primary) hypertension: Secondary | ICD-10-CM | POA: Diagnosis not present

## 2017-06-09 DIAGNOSIS — Z6841 Body Mass Index (BMI) 40.0 and over, adult: Secondary | ICD-10-CM | POA: Diagnosis not present

## 2017-06-09 DIAGNOSIS — Z471 Aftercare following joint replacement surgery: Secondary | ICD-10-CM | POA: Diagnosis not present

## 2017-06-10 DIAGNOSIS — Z96643 Presence of artificial hip joint, bilateral: Secondary | ICD-10-CM | POA: Diagnosis not present

## 2017-06-10 DIAGNOSIS — Z471 Aftercare following joint replacement surgery: Secondary | ICD-10-CM | POA: Diagnosis not present

## 2017-06-10 DIAGNOSIS — Z6841 Body Mass Index (BMI) 40.0 and over, adult: Secondary | ICD-10-CM | POA: Diagnosis not present

## 2017-06-10 DIAGNOSIS — E669 Obesity, unspecified: Secondary | ICD-10-CM | POA: Diagnosis not present

## 2017-06-10 DIAGNOSIS — I10 Essential (primary) hypertension: Secondary | ICD-10-CM | POA: Diagnosis not present

## 2017-06-11 ENCOUNTER — Ambulatory Visit (INDEPENDENT_AMBULATORY_CARE_PROVIDER_SITE_OTHER): Payer: Self-pay | Admitting: Orthopedic Surgery

## 2017-06-11 ENCOUNTER — Encounter: Payer: Self-pay | Admitting: Orthopedic Surgery

## 2017-06-11 VITALS — BP 142/72 | HR 82 | Ht 62.0 in | Wt 248.0 lb

## 2017-06-11 DIAGNOSIS — Z96652 Presence of left artificial knee joint: Secondary | ICD-10-CM

## 2017-06-11 NOTE — Progress Notes (Signed)
Postoperative visit  Chief Complaint  Patient presents with  . Post-op Follow-up    left knee s/p total knee replacement 04/29/17     Postop day number 43-week #6 active range of motion 0-92 degrees passive range of motion of 0-100 degrees Physical Exam  Constitutional: She is oriented to person, place, and time. She appears well-developed and well-nourished.  Cardiovascular:  EDEMA EXPECTED LEFT LEG   Musculoskeletal:       Left knee: Normal.       Legs: Neurological: She is alert and oriented to person, place, and time.  Skin: Skin is warm and dry. No rash noted. No erythema. No pallor.   . Encounter Diagnosis  Name Primary?  . S/P total knee arthroplasty, left 04/29/17 Yes

## 2017-06-12 DIAGNOSIS — E669 Obesity, unspecified: Secondary | ICD-10-CM | POA: Diagnosis not present

## 2017-06-12 DIAGNOSIS — I10 Essential (primary) hypertension: Secondary | ICD-10-CM | POA: Diagnosis not present

## 2017-06-12 DIAGNOSIS — Z96643 Presence of artificial hip joint, bilateral: Secondary | ICD-10-CM | POA: Diagnosis not present

## 2017-06-12 DIAGNOSIS — Z471 Aftercare following joint replacement surgery: Secondary | ICD-10-CM | POA: Diagnosis not present

## 2017-06-12 DIAGNOSIS — Z6841 Body Mass Index (BMI) 40.0 and over, adult: Secondary | ICD-10-CM | POA: Diagnosis not present

## 2017-06-17 DIAGNOSIS — E669 Obesity, unspecified: Secondary | ICD-10-CM | POA: Diagnosis not present

## 2017-06-17 DIAGNOSIS — Z6841 Body Mass Index (BMI) 40.0 and over, adult: Secondary | ICD-10-CM | POA: Diagnosis not present

## 2017-06-17 DIAGNOSIS — Z96643 Presence of artificial hip joint, bilateral: Secondary | ICD-10-CM | POA: Diagnosis not present

## 2017-06-17 DIAGNOSIS — I10 Essential (primary) hypertension: Secondary | ICD-10-CM | POA: Diagnosis not present

## 2017-06-17 DIAGNOSIS — Z471 Aftercare following joint replacement surgery: Secondary | ICD-10-CM | POA: Diagnosis not present

## 2017-06-19 DIAGNOSIS — E669 Obesity, unspecified: Secondary | ICD-10-CM | POA: Diagnosis not present

## 2017-06-19 DIAGNOSIS — I10 Essential (primary) hypertension: Secondary | ICD-10-CM | POA: Diagnosis not present

## 2017-06-19 DIAGNOSIS — Z96643 Presence of artificial hip joint, bilateral: Secondary | ICD-10-CM | POA: Diagnosis not present

## 2017-06-19 DIAGNOSIS — Z471 Aftercare following joint replacement surgery: Secondary | ICD-10-CM | POA: Diagnosis not present

## 2017-06-19 DIAGNOSIS — Z6841 Body Mass Index (BMI) 40.0 and over, adult: Secondary | ICD-10-CM | POA: Diagnosis not present

## 2017-06-25 ENCOUNTER — Other Ambulatory Visit: Payer: Self-pay

## 2017-06-25 ENCOUNTER — Ambulatory Visit (HOSPITAL_COMMUNITY): Payer: Medicare HMO | Attending: Orthopedic Surgery

## 2017-06-25 ENCOUNTER — Encounter (HOSPITAL_COMMUNITY): Payer: Self-pay

## 2017-06-25 DIAGNOSIS — R2689 Other abnormalities of gait and mobility: Secondary | ICD-10-CM

## 2017-06-25 DIAGNOSIS — M6281 Muscle weakness (generalized): Secondary | ICD-10-CM

## 2017-06-25 DIAGNOSIS — M25662 Stiffness of left knee, not elsewhere classified: Secondary | ICD-10-CM | POA: Diagnosis not present

## 2017-06-25 DIAGNOSIS — M25562 Pain in left knee: Secondary | ICD-10-CM | POA: Diagnosis not present

## 2017-06-25 NOTE — Therapy (Addendum)
[facility] [address] [place], [place], [address] Phone: [phone]   Fax:  [phone]  Physical Therapy Evaluation  Patient Details  Name: Patricia Fuentes MRN: [id number] Date of Birth: 24-Jun-1945 Referring Provider: Fuller Canada    # OF FEET WALKED: 313 feet in 3 minutes (gait velocity= 0.52 m/s) AROM:  Flexion: 86 degrees            Extension: 0 degrees PROM:  Flexion: 91 degrees            Extension: 0 degrees   Encounter Date: 06/25/2017  PT End of Session - 06/25/17 1533    Visit Number  1    Number of Visits  19    Date for PT Re-Evaluation  07/16/17    Authorization Type  [employer]    Authorization Time Period  06/25/17- 08/08/17    PT Start Time  1517    PT Stop Time  1557    PT Time Calculation (min)  40 min    Activity Tolerance  Patient tolerated treatment well;No increased pain    Behavior During Therapy  WFL for tasks assessed/performed       Past Medical History:  Diagnosis Date  . Bronchitis   . Chronic back pain   . Essential hypertension   . Hyperlipidemia   . IGT (impaired glucose tolerance) 2015  . Obesity, unspecified   . Osteoarthritis   . Seasonal allergies     Past Surgical History:  Procedure Laterality Date  . CARDIAC CATHETERIZATION N/A 10/25/2014   Procedure: Left Heart Cath and Coronary Angiography;  Surgeon: Kathleene Hazel, MD;  Location: [facility] INVASIVE CV LAB;  Service: Cardiovascular;  Laterality: N/A;  . COLONOSCOPY N/A 02/09/2016   Procedure: COLONOSCOPY;  Surgeon: West Bali, MD;  Location: [facility] ENDO SUITE;  Service: Endoscopy;  Laterality: N/A;  12:30 PM  . KNEE ARTHROSCOPY Left   . TOTAL ABDOMINAL HYSTERECTOMY  1995 approx   Fibroids  . TOTAL KNEE ARTHROPLASTY Right 2011  . TOTAL KNEE ARTHROPLASTY Left 04/29/2017   Procedure: TOTAL KNEE ARTHROPLASTY;  Surgeon: Vickki Hearing, MD;  Location: [facility] ORS;  Service: Orthopedics;  Laterality: Left;    There were  no vitals filed for this visit.   Subjective Assessment - 06/25/17 1523    Pertinent History  R TKA 2011, L TKA 2018    How long can you sit comfortably?  1 hour because leg will swell    How long can you stand comfortably?  45 mins    How long can you walk comfortably?  abuot 1 hour    Patient Stated Goals  be able to bend leg more and walk easier    Currently in Pain?  Yes    Pain Score  2     Pain Location  Knee    Pain Orientation  Left    Pain Descriptors / Indicators  Dull    Pain Type  Chronic pain;Surgical pain    Pain Onset  More than a month ago    Pain Frequency  Intermittent    Aggravating Factors   bending knee, movement    Pain Relieving Factors  ice, alieve    Effect of Pain on Daily Activities  minimally limited    Multiple Pain Sites  No         [facility] PT Assessment - 06/25/17 0001      Assessment   Medical Diagnosis  Left TKA  Referring Provider  Fuller Canada    Onset Date/Surgical Date  04/29/17    Next MD Visit  2/30/19    Prior Therapy  for R TKA      Precautions   Precautions  None      Restrictions   Weight Bearing Restrictions  No      Balance Screen   Has the patient fallen in the past 6 months  No    Has the patient had a decrease in activity level because of a fear of falling?   Yes    Is the patient reluctant to leave their home because of a fear of falling?   No      Home Public house manager residence    Living Arrangements  Spouse/significant other    Available Help at Discharge  Family    Type of Home  House    Home Access  Stairs to enter;Ramped entrance    Entrance Stairs-Number of Steps  5    Entrance Stairs-Rails  Right going down    Home Layout  One level    Home Equipment  Walker - 2 wheels;Cane - single point;Grab bars - toilet;Grab bars - tub/shower    Additional Comments  patient doesn't use the stairs      Prior Function   Level of Independence  Independent with household mobility with  device;Independent with basic ADLs;Independent with community mobility with device    Vocation  Retired;Part time employment    Engineer, 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    Leisure  back to work, reading       Cognition   Overall Cognitive Status  Within Functional Limits for tasks assessed      Observation/Other Assessments   Focus on Therapeutic Outcomes (FOTO)   58% limited      Observation/Other Assessments-Edema    Edema  Circumferential      Circumferential Edema   Circumferential - Right  suprapatellar (54cm) infrapatellar (47.5 cm)    Circumferential - Left   suprapatellar (59.5 cm) infrapateller (51 cm)      Functional Tests   Functional tests  --      ROM / Strength   AROM / PROM / Strength  AROM;Strength      AROM   AROM Assessment Site  Knee    Right/Left Knee  Left    Left Knee Extension  0    Left Knee Flexion  87      Strength   Strength Assessment Site  Hip;Knee;Ankle    Right Hip Flexion  3+/5    Right Hip Extension  2-/5 unable to attain test position    Right Hip ABduction  3-/5    Left Hip Flexion  3+/5    Left Hip Extension  2-/5 unable to attain test position    Left Hip ABduction  3-/5    Right/Left Knee  Right;Left    Right Knee Flexion  4+/5    Right Knee Extension  4+/5    Left Knee Flexion  4/5 painful    Left Knee Extension  4/5 painful    Right Ankle Dorsiflexion  4+/5    Left Ankle Dorsiflexion  4+/5      Flexibility   Soft Tissue Assessment /Muscle Length  yes    Hamstrings  50% limited    Quadriceps  50% limited      Transfers   Five time  sit to stand comments   22 seconds without UE      Ambulation/Gait   Ambulation/Gait  Yes    Ambulation/Gait Assistance  6: Modified independent (Device/Increase time)    Ambulation Distance (Feet)  313 Feet    Assistive device  Straight cane    Gait Pattern  Step-through pattern;Decreased step length - right;Decreased stance time -  left;Decreased hip/knee flexion - left;Decreased hip/knee flexion - right;Decreased stride length;Decreased weight shift to left;Trendelenburg;Antalgic;Trunk flexed;Lateral trunk lean to right    Ambulation Surface  Level    Gait velocity  0.52 m/s    Stairs  Yes    Stairs Assistance  6: Modified independent (Device/Increase time)    Stair Management Technique  Two rails;Step to pattern;Forwards    Number of Stairs  4    Height of Stairs  6      Standardized Balance Assessment   Standardized Balance Assessment  Timed Up and Go Test      Timed Up and Go Test   TUG  Normal TUG    Normal TUG (seconds)  14.38 with SPC        Objective measurements completed on examination: See above findings.     PT Education - 06/25/17 1523    Education provided  Yes    Education Details  Educated on evaluation findings and appropriate POC. Discussed option for 3x/week versus 2x/week to begin therapy. Educated on initial exercises for flexion ROM and to decrease swelling. Educated on proper elevation for edema management with LE above heart and on icing after exercise/activity to prevent further swelling. Discussed compression garment and will measure for thigh high compression at next session.    Person(s) Educated  Patient;Spouse    Methods  Explanation;Handout    Comprehension  Verbalized understanding       PT Short Term Goals - 06/25/17 1911      PT SHORT TERM GOAL #1   Title  Patient will be independent with HEP to demonstrate improved independence with exercise and advance functional strengthening.    Time  2    Period  Weeks    Status  New    Target Date  07/09/17      PT SHORT TERM GOAL #2   Title  Patient will have improve AROM into flexion for left knee by 8 degrees to demonstrate significant change and improvement in joint mobility for improved functional mobility wiht gait and stairs.    Time  3    Period  Weeks    Status  New    Target Date  07/16/17      PT SHORT TERM GOAL  #3   Title  Patient will demonstrate improved MMT for limited muscle groups by 1/2 grade to improve functional strength for improve gait quality and greater muscular endurance with functional activity.    Time  3    Period  Weeks    Status  New      PT SHORT TERM GOAL #4   Title  Patient will perform TUG wiht LRAD in less than or equal to 12 seconds to reduce fall risk during dynamic gait activitites.    Time  3    Period  Weeks    Status  New        PT Long Term Goals - 06/25/17 1915      PT LONG TERM GOAL #1   Title  Patient will have improve AROM into flexion for left knee to 110 degrees to have  improvement in joint mobility for improved functional mobility wiht gait and stairs.    Time  6    Period  Weeks    Status  New    Target Date  08/06/17      PT LONG TERM GOAL #2   Title  Patient will demonstrate improved MMT for limited muscle groups by 1 grade to improve functional strength for improve gait quality and greater muscular endurance with functional activity.    Time  6    Period  Weeks    Status  New      PT LONG TERM GOAL #3   Title  Patient will improve 5x sit to stand testing in less than 15 seconds to demosntrate significant improvement in bil LE strength and decrease risk of falling.    Time  6    Period  Weeks    Status  New      PT LONG TERM GOAL #4   Title  Patient will have decreased circumferential edema measurement to be no more than 1 cm greater above and below the patella from right to left knee.    Time  6    Period  Weeks    Status  New        Plan - 06/25/17 1921    Clinical Impression Statement  Patient presents for initial evaluation folloiwng Lt TKA on 04/29/2017. She presents with significant limitations into flexion for left knee motion and has significant edema surrounding left knee. She has limited bil LE strength, decreased balance, decreased activity tolerance, impraired flexibility, and mysofascial restriction including decreased scar  mobility. She will benefit from skilled PT services to address current impairments and progress towards goals to improve function, independence, and QOL.    History and Personal Factors relevant to plan of care:  R TKA in 2011    Clinical Presentation  Stable    Clinical Presentation due to:  FOTO, MMT, ROM, TUG, 5xSit to Stand, Clinical Judgement    Clinical Decision Making  Low    Rehab Potential  Fair    Clinical Impairments Affecting Rehab Potential  (+) motivation, (+) positive experience with PT for right TKA, (-) edema    PT Frequency  3x / week    PT Duration  6 weeks    PT Treatment/Interventions  ADLs/Self Care Home Management;Cryotherapy;Electrical Stimulation;DME Instruction;Gait training;Stair training;Functional mobility training;Therapeutic activities;Therapeutic exercise;Balance training;Neuromuscular re-education;Patient/family education;Manual techniques;Scar mobilization;Passive range of motion;Energy conservation;Taping    PT Next Visit Plan  Review Eval and goals. Initiated manual therapy for edema with retrograde massage and measure for thigh high compression garment. Begin joint mobilization for flexion with AP glide when edema decreases. Initiate functional ROM/strengthening with heel slides, TKE, clamshells, straight leg raise.    PT Home Exercise Plan  Eval: heel raises, heel slides with flexion stretch    Consulted and Agree with Plan of Care  Patient       Patient will benefit from skilled therapeutic intervention in order to improve the following deficits and impairments:  Decreased balance, Abnormal gait, Decreased endurance, Decreased mobility, Difficulty walking, Hypomobility, Decreased range of motion, Decreased scar mobility, Increased edema, Improper body mechanics, Decreased activity tolerance, Decreased knowledge of use of DME, Decreased strength, Increased fascial restricitons, Impaired flexibility, Postural dysfunction  Visit Diagnosis: Left knee pain,  unspecified chronicity  Stiffness of left knee, not elsewhere classified  Other abnormalities of gait and mobility  Muscle weakness (generalized)     Problem List Patient Active Problem List  Diagnosis Date Noted  . S/P total knee arthroplasty, left 04/29/17 05/06/2017  . Anemia 05/06/2017  . Primary osteoarthritis of left knee 04/29/2017  . Primary osteoarthritis of both knees   . Abnormal CT scan, colon 01/18/2016  . Dyspnea   . Abnormal myocardial perfusion study   . Fatigue 07/18/2014  . Vitamin D deficiency 07/18/2014  . Exercise intolerance 07/18/2014  . Sleep disorder 07/18/2014  . Nonspecific abnormal electrocardiogram (ECG) (EKG) 07/18/2014  . Reduced vision 12/27/2013  . Radicular pain of thoracic region 06/22/2013  . Prediabetes 10/20/2012  . Metabolic syndrome X 10/20/2012  . BACK PAIN WITH RADICULOPATHY 09/20/2008  . Hyperlipidemia LDL goal <100 03/29/2008  . Morbid obesity (HCC) 03/29/2008  . Essential hypertension 10/07/2007    Valentino Saxon, PT, DPT Physical Therapist with [facility]  06/25/2017 7:40 PM    [facility] [address] [place], [place], [address] Phone: [phone]   Fax:  [phone]  Name: Patricia Fuentes MRN: [id number] Date of Birth: 1946/01/21

## 2017-06-25 NOTE — Patient Instructions (Signed)
    STANDING HEEL RAISES: 2 sets 15-20 repetitions  While standing, raise up on your toes as you lift your heels off the ground.      HEEL SLIDES - LONG SIT WITH TOWEL AND BELT: 2 sets of 12-20 repetitions, hold stretch for 5 seconds  While in a sitting position, place a small hand towel under your heel. Next, loop a belt, towel or bed sheet around your foot and pull your knee into a bend position as your foot slides towards your buttock. Hold a gentle stretch and then return back to original position.

## 2017-06-27 ENCOUNTER — Encounter (HOSPITAL_COMMUNITY): Payer: Self-pay

## 2017-06-27 ENCOUNTER — Ambulatory Visit (HOSPITAL_COMMUNITY): Payer: Medicare HMO | Attending: Orthopedic Surgery

## 2017-06-27 DIAGNOSIS — R2689 Other abnormalities of gait and mobility: Secondary | ICD-10-CM | POA: Diagnosis not present

## 2017-06-27 DIAGNOSIS — M6281 Muscle weakness (generalized): Secondary | ICD-10-CM | POA: Insufficient documentation

## 2017-06-27 DIAGNOSIS — M25662 Stiffness of left knee, not elsewhere classified: Secondary | ICD-10-CM | POA: Diagnosis not present

## 2017-06-27 DIAGNOSIS — M25562 Pain in left knee: Secondary | ICD-10-CM | POA: Diagnosis not present

## 2017-06-27 NOTE — Therapy (Signed)
[facility] [address] [place], [place], [address] Phone: [phone]   Fax:  [phone]  Physical Therapy Treatment  Patient Details  Name: Patricia Fuentes MRN: [id number] Date of Birth: [birth year] Referring Provider: Fuller Canada   Encounter Date: 06/27/2017  PT End of Session - 06/27/17 1525    Visit Number  2    Number of Visits  19    Date for PT Re-Evaluation  07/16/17    Authorization Type  [employer]    Authorization Time Period  06/25/17- 08/08/17    PT Start Time  1522    PT Stop Time  1600    PT Time Calculation (min)  38 min    Activity Tolerance  Patient tolerated treatment well;No increased pain    Behavior During Therapy  WFL for tasks assessed/performed       Past Medical History:  Diagnosis Date  . Bronchitis   . Chronic back pain   . Essential hypertension   . Hyperlipidemia   . IGT (impaired glucose tolerance) 2015  . Obesity, unspecified   . Osteoarthritis   . Seasonal allergies     Past Surgical History:  Procedure Laterality Date  . CARDIAC CATHETERIZATION N/A 10/25/2014   Procedure: Left Heart Cath and Coronary Angiography;  Surgeon: Kathleene Hazel, MD;  Location: [facility] INVASIVE CV LAB;  Service: Cardiovascular;  Laterality: N/A;  . COLONOSCOPY N/A 02/09/2016   Procedure: COLONOSCOPY;  Surgeon: West Bali, MD;  Location: [facility];  Service: Endoscopy;  Laterality: N/A;  12:30 PM  . KNEE ARTHROSCOPY Left   . TOTAL ABDOMINAL HYSTERECTOMY  1995 approx   Fibroids  . TOTAL KNEE ARTHROPLASTY Right 2011  . TOTAL KNEE ARTHROPLASTY Left 04/29/2017   Procedure: TOTAL KNEE ARTHROPLASTY;  Surgeon: Vickki Hearing, MD;  Location: [facility] ORS;  Service: Orthopedics;  Laterality: Left;    There were no vitals filed for this visit.  Subjective Assessment - 06/27/17 1524    Subjective  Pt stated she is feeling good today, no reoprts of pain currently.  Has began HEP wihtout questions.       Patient Stated Goals  be able to bend leg more and walk easier    Currently in Pain?  No/denies         [facility] PT Assessment - 06/27/17 0001      Assessment   Medical Diagnosis  Left TKA    Referring Provider  Fuller Canada    Onset Date/Surgical Date  04/29/17    Next MD Visit  2/30/19    Prior Therapy  for R TKA      Observation/Other Assessments-Edema    Edema  -- ankle 10in, calf 19in, mid-thigh 27in for compression hose                   [facility] Adult PT Treatment/Exercise - 06/27/17 0001      Exercises   Exercises  Knee/Hip      Knee/Hip Exercises: Stretches   Knee: Self-Stretch to increase Flexion  10 seconds    Knee: Self-Stretch Limitations  10x 10" on 8in step for flexion      Knee/Hip Exercises: Supine   Short Arc Quad Sets  15 reps    Heel Slides  15 reps    Heel Slides Limitations  3-5" hold    Straight Leg Raises  Left;10 reps    Straight Leg Raises Limitations  cueing to reduce ER    Knee Extension  AROM  (Pended)     Knee Extension Limitations  0  (Pended)     Knee Flexion  AROM  (Pended)     Knee Flexion Limitations  96  (Pended)     Other Supine Knee/Hip Exercises  clam BLE 10x      Manual Therapy   Manual Therapy  Edema management;Joint mobilization;Myofascial release  (Pended)     Manual therapy comments  Manual complete separate of rest of tx    Edema Management  Retro massage with LE elevated    Joint Mobilization  patella mobs all direcitons and tib/fib               PT Short Term Goals - 06/25/17 1911      PT SHORT TERM GOAL #1   Title  Patient will be independent with HEP to demonstrate improved independence with exercise and advance functional strengthening.    Time  2    Period  Weeks    Status  New    Target Date  07/09/17      PT SHORT TERM GOAL #2   Title  Patient will have improve AROM into flexion for left knee by 8 degrees to demonstrate significant change and improvement in joint mobility for improved  functional mobility wiht gait and stairs.    Time  3    Period  Weeks    Status  New    Target Date  07/16/17      PT SHORT TERM GOAL #3   Title  Patient will demonstrate improved MMT for limited muscle groups by 1/2 grade to improve functional strength for improve gait quality and greater muscular endurance with functional activity.    Time  3    Period  Weeks    Status  New      PT SHORT TERM GOAL #4   Title  Patient will perform TUG wiht LRAD in less than or equal to 12 seconds to reduce fall risk during dynamic gait activitites.    Time  3    Period  Weeks    Status  New        PT Long Term Goals - 06/25/17 1915      PT LONG TERM GOAL #1   Title  Patient will have improve AROM into flexion for left knee to 110 degrees to have improvement in joint mobility for improved functional mobility wiht gait and stairs.    Time  6    Period  Weeks    Status  New    Target Date  08/06/17      PT LONG TERM GOAL #2   Title  Patient will demonstrate improved MMT for limited muscle groups by 1 grade to improve functional strength for improve gait quality and greater muscular endurance with functional activity.    Time  6    Period  Weeks    Status  New      PT LONG TERM GOAL #3   Title  Patient will improve 5x sit to stand testing in less than 15 seconds to demosntrate significant improvement in bil LE strength and decrease risk of falling.    Time  6    Period  Weeks    Status  New      PT LONG TERM GOAL #4   Title  Patient will have decreased circumferential edema measurement to be no more than 1 cm greater above and below the patella from right to left knee.  Time  6    Period  Weeks    Status  New            Plan - 06/27/17 [id number]    Clinical Impression Statement  Reviewed goals, assured compliance iwth HEP and copy of eval given to pt.  Began session with manual technqiues to address scar tissue adhesions on incision, joint mobs to improve mobility and retro massage  for edema control.  DIfficulty with patella mobs due to edema present proximal knee.  Pt educated on benefits of compression hose, measurements taken and paperwork given for hose purchase.  Therex focus on knee mobiltiy.  AROM improved from 0-87 to 0-96 degrees.  No reports of pain through session.  Discussion held with beneftis of RICE technqiues for edema and pain control.       Rehab Potential  Fair    Clinical Impairments Affecting Rehab Potential  (+) motivation, (+) positive experience with PT for right TKA, (-) edema    PT Frequency  3x / week    PT Duration  6 weeks    PT Treatment/Interventions  ADLs/Self Care Home Management;Cryotherapy;Electrical Stimulation;DME Instruction;Gait training;Stair training;Functional mobility training;Therapeutic activities;Therapeutic exercise;Balance training;Neuromuscular re-education;Patient/family education;Manual techniques;Scar mobilization;Passive range of motion;Energy conservation;Taping    PT Next Visit Plan  Focus wiht knee mobiltiy.  Include manual therapy for MFR scar tissue restrictions, joint mobs and edema control.  F/U wiht purchase of compression garment and education on donn/doffing hose.      PT Home Exercise Plan  Eval: heel raises, heel slides with flexion stretch       Patient will benefit from skilled therapeutic intervention in order to improve the following deficits and impairments:  Decreased balance, Abnormal gait, Decreased endurance, Decreased mobility, Difficulty walking, Hypomobility, Decreased range of motion, Decreased scar mobility, Increased edema, Improper body mechanics, Decreased activity tolerance, Decreased knowledge of use of DME, Decreased strength, Increased fascial restricitons, Impaired flexibility, Postural dysfunction  Visit Diagnosis: Left knee pain, unspecified chronicity  Stiffness of left knee, not elsewhere classified  Other abnormalities of gait and mobility  Muscle weakness  (generalized)     Problem List Patient Active Problem List   Diagnosis Date Noted  . S/P total knee arthroplasty, left 04/29/17 05/06/2017  . Anemia 05/06/2017  . Primary osteoarthritis of left knee 04/29/2017  . Primary osteoarthritis of both knees   . Abnormal CT scan, colon 01/18/2016  . Dyspnea   . Abnormal myocardial perfusion study   . Fatigue 07/18/2014  . Vitamin D deficiency 07/18/2014  . Exercise intolerance 07/18/2014  . Sleep disorder 07/18/2014  . Nonspecific abnormal electrocardiogram (ECG) (EKG) 07/18/2014  . Reduced vision 12/27/2013  . Radicular pain of thoracic region 06/22/2013  . Prediabetes 10/20/2012  . Metabolic syndrome X 10/20/2012  . BACK PAIN WITH RADICULOPATHY 09/20/2008  . Hyperlipidemia LDL goal <100 03/29/2008  . Morbid obesity (HCC) 03/29/2008  . Essential hypertension 10/07/2007   Becky Saxasey Chatara Lucente, LPTA; CBIS [phone]  Juel BurrowCockerham, Ovadia Lopp Jo 06/27/2017, 4:09 PM  [facility] [address] [place], [place], [address] Phone: [phone]   Fax:  [phone]  Name: [name] MRN: [id number] Date of Birth: [birth year]

## 2017-06-30 ENCOUNTER — Ambulatory Visit (HOSPITAL_COMMUNITY): Payer: Medicare HMO

## 2017-06-30 ENCOUNTER — Other Ambulatory Visit: Payer: Self-pay

## 2017-06-30 ENCOUNTER — Encounter (HOSPITAL_COMMUNITY): Payer: Self-pay

## 2017-06-30 DIAGNOSIS — M25662 Stiffness of left knee, not elsewhere classified: Secondary | ICD-10-CM | POA: Diagnosis not present

## 2017-06-30 DIAGNOSIS — R2689 Other abnormalities of gait and mobility: Secondary | ICD-10-CM

## 2017-06-30 DIAGNOSIS — M6281 Muscle weakness (generalized): Secondary | ICD-10-CM | POA: Diagnosis not present

## 2017-06-30 DIAGNOSIS — M25562 Pain in left knee: Secondary | ICD-10-CM

## 2017-06-30 NOTE — Therapy (Signed)
[facility] [address] [place], [place], [address] Phone: [phone]   Fax:  [phone]  Physical Therapy Treatment  Patient Details  Name: Patricia Fuentes MRN: [id number] Date of Birth: 25-Apr-1946 Referring Provider: Fuller Canada   Encounter Date: 06/30/2017  PT End of Session - 06/30/17 1554    Visit Number  3    Number of Visits  19    Date for PT Re-Evaluation  07/16/17    Authorization Type  [employer]    Authorization Time Period  06/25/17- 08/08/17    PT Start Time  1520    PT Stop Time  1602    PT Time Calculation (min)  42 min    Activity Tolerance  Patient tolerated treatment well;No increased pain    Behavior During Therapy  WFL for tasks assessed/performed       Past Medical History:  Diagnosis Date  . Bronchitis   . Chronic back pain   . Essential hypertension   . Hyperlipidemia   . IGT (impaired glucose tolerance) 2015  . Obesity, unspecified   . Osteoarthritis   . Seasonal allergies     Past Surgical History:  Procedure Laterality Date  . CARDIAC CATHETERIZATION N/A 10/25/2014   Procedure: Left Heart Cath and Coronary Angiography;  Surgeon: Kathleene Hazel, MD;  Location: [facility] INVASIVE CV LAB;  Service: Cardiovascular;  Laterality: N/A;  . COLONOSCOPY N/A 02/09/2016   Procedure: COLONOSCOPY;  Surgeon: West Bali, MD;  Location: [facility]O SU[facility];  Service: Endoscopy;  Laterality: N/A;  12:30 PM  . KNEE ARTHROSCOPY Left   . TOTAL ABDOMINAL HYSTERECTOMY  1995 approx   Fibroids  . TOTAL KNEE ARTHROPLASTY Right 2011  . TOTAL KNEE ARTHROPLASTY Left 04/29/2017   Procedure: TOTAL KNEE ARTHROPLASTY;  Surgeon: Vickki Hearing, MD;  Location: [facility] ORS;  Service: Orthopedics;  Laterality: Left;    There were no vitals filed for this visit.  Subjective Assessment - 06/30/17 1557    Subjective  Patient is feelign good today, she repports trying to do the scar massage at home but not being sure if she  was doing it correctly. She states she tried calling for her compression garment but was put on hold and plans to call again tonight.    Pertinent History  R TKA 2011, L TKA 2018    How long can you sit comfortably?  1 hour because leg will swell    How long can you stand comfortably?  45 mins    How long can you walk comfortably?  abuot 1 hour    Patient Stated Goals  be able to bend leg more and walk easier    Currently in Pain?  No/denies       [facility] Adult PT Treatment/Exercise - 06/30/17 0001      Knee/Hip Exercises: Stretches   Knee: Self-Stretch to increase Flexion  10 seconds    Knee: Self-Stretch Limitations  10x 10" on 12" box for flexion    Gastroc Stretch  Both;30 seconds;Limitations;3 reps    Gastroc Stretch Limitations  slant board      Knee/Hip Exercises: Standing   Terminal Knee Extension  AROM;Strengthening;Left;1 set;15 reps;Limitations    Terminal Knee Extension Limitations  Blue theraband, 5 second holds      Knee/Hip Exercises: Seated   Long Arc Quad  Left;1 set;15 reps    Other Seated Knee/Hip Exercises  15x 10 second holds for assisted left knee flexion stretch  Manual Therapy   Manual Therapy  Edema management;Joint mobilization;Myofascial release    Manual therapy comments  Manual complete separate of rest of tx    Edema Management  Retro massage with LE elevated    Joint Mobilization  patella mobs all direcitons and tib/fib; AP glide to tibiofemoral for flexion, grade III3x 30-45 seconds each    Myofascial Release  to reduce scar tissue adhesions on incision         PT Education - 06/30/17 1600    Education provided  Yes    Education Details  Educated on importance of obtaining compression garment for left leg and on continuing with HEP daily. Educated on exercise technique throughout.     Person(s) Educated  Patient    Methods  Explanation    Comprehension  Verbalized understanding       PT Short Term Goals - 06/25/17 1911      PT SHORT  TERM GOAL #1   Title  Patient will be independent with HEP to demonstrate improved independence with exercise and advance functional strengthening.    Time  2    Period  Weeks    Status  New    Target Date  07/09/17      PT SHORT TERM GOAL #2   Title  Patient will have improve AROM into flexion for left knee by 8 degrees to demonstrate significant change and improvement in joint mobility for improved functional mobility wiht gait and stairs.    Time  3    Period  Weeks    Status  New    Target Date  07/16/17      PT SHORT TERM GOAL #3   Title  Patient will demonstrate improved MMT for limited muscle groups by 1/2 grade to improve functional strength for improve gait quality and greater muscular endurance with functional activity.    Time  3    Period  Weeks    Status  New      PT SHORT TERM GOAL #4   Title  Patient will perform TUG wiht LRAD in less than or equal to 12 seconds to reduce fall risk during dynamic gait activitites.    Time  3    Period  Weeks    Status  New        PT Long Term Goals - 06/25/17 1915      PT LONG TERM GOAL #1   Title  Patient will have improve AROM into flexion for left knee to 110 degrees to have improvement in joint mobility for improved functional mobility wiht gait and stairs.    Time  6    Period  Weeks    Status  New    Target Date  08/06/17      PT LONG TERM GOAL #2   Title  Patient will demonstrate improved MMT for limited muscle groups by 1 grade to improve functional strength for improve gait quality and greater muscular endurance with functional activity.    Time  6    Period  Weeks    Status  New      PT LONG TERM GOAL #3   Title  Patient will improve 5x sit to stand testing in less than 15 seconds to demosntrate significant improvement in bil LE strength and decrease risk of falling.    Time  6    Period  Weeks    Status  New      PT LONG TERM GOAL #4  Title  Patient will have decreased circumferential edema measurement to  be no more than 1 cm greater above and below the patella from right to left knee.    Time  6    Period  Weeks    Status  New         Plan - 06/30/17 1553    Clinical Impression Statement  Patient is progressing well in therapy with excellent compliance of HEP. She is greatly limited by left LE edema and has been educated on importance of compression garment to reduce swelling for improved muscle activation and ROM. She continues to have no pain during therapy and session focused on scar massage, edema management, and exercises for mobility/ROM. She will continue to benefit from skilled PT services to address current impairments and progress towards goals.     Rehab Potential  Fair    Clinical Impairments Affecting Rehab Potential  (+) motivation, (+) positive experience with PT for right TKA, (-) edema    PT Frequency  3x / week    PT Duration  6 weeks    PT Treatment/Interventions  ADLs/Self Care Home Management;Cryotherapy;Electrical Stimulation;DME Instruction;Gait training;Stair training;Functional mobility training;Therapeutic activities;Therapeutic exercise;Balance training;Neuromuscular re-education;Patient/family education;Manual techniques;Scar mobilization;Passive range of motion;Energy conservation;Taping    PT Next Visit Plan  Focus wiht knee mobiltiy.  Include manual therapy for MFR scar tissue restrictions, joint mobs and edema control.  Aks patient about purchase of compression garment and education on donn/doffing hose.  Update HEP next session with SLR, Brdiges, and standing TKE.    PT Home Exercise Plan  Eval: heel raises, heel slides with flexion stretch    Consulted and Agree with Plan of Care  Patient       Patient will benefit from skilled therapeutic intervention in order to improve the following deficits and impairments:  Decreased balance, Abnormal gait, Decreased endurance, Decreased mobility, Difficulty walking, Hypomobility, Decreased range of motion, Decreased scar  mobility, Increased edema, Improper body mechanics, Decreased activity tolerance, Decreased knowledge of use of DME, Decreased strength, Increased fascial restricitons, Impaired flexibility, Postural dysfunction  Visit Diagnosis: Left knee pain, unspecified chronicity  Stiffness of left knee, not elsewhere classified  Other abnormalities of gait and mobility  Muscle weakness (generalized)     Problem List Patient Active Problem List   Diagnosis Date Noted  . S/P total knee arthroplasty, left 04/29/17 05/06/2017  . Anemia 05/06/2017  . Primary osteoarthritis of left knee 04/29/2017  . Primary osteoarthritis of both knees   . Abnormal CT scan, colon 01/18/2016  . Dyspnea   . Abnormal myocardial perfusion study   . Fatigue 07/18/2014  . Vitamin D deficiency 07/18/2014  . Exercise intolerance 07/18/2014  . Sleep disorder 07/18/2014  . Nonspecific abnormal electrocardiogram (ECG) (EKG) 07/18/2014  . Reduced vision 12/27/2013  . Radicular pain of thoracic region 06/22/2013  . Prediabetes 10/20/2012  . Metabolic syndrome X 10/20/2012  . BACK PAIN WITH RADICULOPATHY 09/20/2008  . Hyperlipidemia LDL goal <100 03/29/2008  . Morbid obesity (HCC) 03/29/2008  . Essential hypertension 10/07/2007    Valentino Saxonachel Quinn-Brown, PT, DPT Physical Therapist with [facility]  06/30/2017 4:09 PM    [facility] [address] [place], [place], [address] Phone: [phone]   Fax:  [phone]  Name: Laban EmperorSylvia A Guilbault MRN: [id number] Date of Birth: 02/09/1946

## 2017-07-02 ENCOUNTER — Ambulatory Visit (HOSPITAL_COMMUNITY): Payer: Medicare HMO

## 2017-07-02 ENCOUNTER — Encounter (HOSPITAL_COMMUNITY): Payer: Self-pay

## 2017-07-02 DIAGNOSIS — M6281 Muscle weakness (generalized): Secondary | ICD-10-CM

## 2017-07-02 DIAGNOSIS — M25662 Stiffness of left knee, not elsewhere classified: Secondary | ICD-10-CM

## 2017-07-02 DIAGNOSIS — R2689 Other abnormalities of gait and mobility: Secondary | ICD-10-CM

## 2017-07-02 DIAGNOSIS — M25562 Pain in left knee: Secondary | ICD-10-CM | POA: Diagnosis not present

## 2017-07-02 NOTE — Patient Instructions (Addendum)
HIP: Flexion / KNEE: Extension, Straight Leg Raise    Raise leg, keeping knee straight. Perform slowly. 10-20 reps per set, 2 sets per day, 4 days per week   Copyright  VHI. All rights reserved.   Bridge    Lie back, legs bent. Inhale, pressing hips up. Keeping ribs in, lengthen lower back. Exhale, rolling down along spine from top. Repeat 10-20 times. Do 2 sessions per day.  http://pm.exer.us/55   Copyright  VHI. All rights reserved.   Knee Extension: Terminal - Standing (Single Leg)    Face anchor in shoulder width stance, band around knee. Allow tension of band to slightly bend knee. Pull leg back, straightening knee. Repeat 10 times per set. Hold for 5 seconds.  Do 4 sets per week. Anchor Height: Knee  http://tub.exer.us/36   Copyright  VHI. All rights reserved.

## 2017-07-02 NOTE — Therapy (Signed)
[facility] [address] [place], [place], [address] Phone: [phone]   Fax:  [phone]  Physical Therapy Treatment  Patient Details  Name: Patricia Fuentes MRN: [id number] Date of Birth: Apr 05, 1946 Referring Provider: Fuller CanadaStanley Harrison   Encounter Date: 07/02/2017  PT End of Session - 07/02/17 1547    Visit Number  4    Number of Visits  19    Date for PT Re-Evaluation  07/16/17    Authorization Type  [employer]    Authorization Time Period  06/25/17- 08/08/17    PT Start Time  1520    PT Stop Time  1558    PT Time Calculation (min)  38 min    Activity Tolerance  Patient tolerated treatment well;No increased pain    Behavior During Therapy  WFL for tasks assessed/performed       Past Medical History:  Diagnosis Date  . Bronchitis   . Chronic back pain   . Essential hypertension   . Hyperlipidemia   . IGT (impaired glucose tolerance) 2015  . Obesity, unspecified   . Osteoarthritis   . Seasonal allergies     Past Surgical History:  Procedure Laterality Date  . CARDIAC CATHETERIZATION N/A 10/25/2014   Procedure: Left Heart Cath and Coronary Angiography;  Surgeon: Kathleene Hazelhristopher D McAlhany, MD;  [facility];  Service: Cardiovascular;  Laterality: N/A;  . COLONOSCOPY N/A 02/09/2016   Procedure: COLONOSCOPY;  Surgeon: West BaliSandi L Fields, MD;  Location: [facility];  Service: Endoscopy;  Laterality: N/A;  12:30 PM  . KNEE ARTHROSCOPY Left   . TOTAL ABDOMINAL HYSTERECTOMY  1995 approx   Fibroids  . TOTAL KNEE ARTHROPLASTY Right 2011  . TOTAL KNEE ARTHROPLASTY Left 04/29/2017   Procedure: TOTAL KNEE ARTHROPLASTY;  Surgeon: Vickki HearingHarrison, Stanley E, MD;  Location: [facility] ORS;  Service: Orthopedics;  Laterality: Left;    There were no vitals filed for this visit.  Subjective Assessment - 07/02/17 1518    Subjective  Pt stated she is feeling good today, no reports of pain today.  Pt stated she is tired today, went to  [employer] to pick up compression garment, stated they did not have compression hose her size, was able to purchase compression tights 20-30 mmHg.    Patient Stated Goals  be able to bend leg more and walk easier    Currently in Pain?  No/denies                      [facility]/Exercise - 07/02/17 0001      Knee/Hip Exercises: Stretches   Quad Stretch  3 reps;30 seconds prone with rope    Knee: Self-Stretch to increase Flexion  10 seconds    Knee: Self-Stretch Limitations  10x 10" on 12" box for flexion      Knee/Hip Exercises: Standing   Knee Flexion  10 reps    Terminal Knee Extension  AROM;Strengthening;Left;1 set;15 reps;Limitations    Terminal Knee Extension Limitations  Blue theraband, 5 second holds      Knee/Hip Exercises: Seated   Sit to Sand  10 reps;without UE support cueing for equal weight bearing      Knee/Hip Exercises: Supine   Heel Slides  15 reps    Bridges  2 sets;10 reps    Straight Leg Raises  Left;10 reps    Straight Leg Raises Limitations  cueing to reduce ER    Knee Extension  AROM  Knee Extension Limitations  0      Manual Therapy   Manual Therapy  Edema management;Joint mobilization;Myofascial release    Manual therapy comments  Manual complete separate of rest of tx    Edema Management  Retro massage with LE elevated    Joint Mobilization  patella mobs all direcitons and tib/fib; AP glide to tibiofemoral for flexion, grade III3x 30-45 seconds each    Myofascial Release  to reduce scar tissue adhesions on incision             PT Education - 07/02/17 1545    Education provided  Yes    Education Details  Reviewed donn/doffing compression tights for ease, Educated new additional HEP for strengthening    Person(s) Educated  Patient    Methods  Explanation;Demonstration;Handout    Comprehension  Verbalized understanding       PT Short Term Goals - 06/25/17 1911      PT SHORT TERM GOAL #1   Title  Patient will be  independent with HEP to demonstrate improved independence with exercise and advance functional strengthening.    Time  2    Period  Weeks    Status  New    Target Date  07/09/17      PT SHORT TERM GOAL #2   Title  Patient will have improve AROM into flexion for left knee by 8 degrees to demonstrate significant change and improvement in joint mobility for improved functional mobility wiht gait and stairs.    Time  3    Period  Weeks    Status  New    Target Date  07/16/17      PT SHORT TERM GOAL #3   Title  Patient will demonstrate improved MMT for limited muscle groups by 1/2 grade to improve functional strength for improve gait quality and greater muscular endurance with functional activity.    Time  3    Period  Weeks    Status  New      PT SHORT TERM GOAL #4   Title  Patient will perform TUG wiht LRAD in less than or equal to 12 seconds to reduce fall risk during dynamic gait activitites.    Time  3    Period  Weeks    Status  New        PT Long Term Goals - 06/25/17 1915      PT LONG TERM GOAL #1   Title  Patient will have improve AROM into flexion for left knee to 110 degrees to have improvement in joint mobility for improved functional mobility wiht gait and stairs.    Time  6    Period  Weeks    Status  New    Target Date  08/06/17      PT LONG TERM GOAL #2   Title  Patient will demonstrate improved MMT for limited muscle groups by 1 grade to improve functional strength for improve gait quality and greater muscular endurance with functional activity.    Time  6    Period  Weeks    Status  New      PT LONG TERM GOAL #3   Title  Patient will improve 5x sit to stand testing in less than 15 seconds to demosntrate significant improvement in bil LE strength and decrease risk of falling.    Time  6    Period  Weeks    Status  New      PT LONG TERM  GOAL #4   Title  Patient will have decreased circumferential edema measurement to be no more than 1 cm greater above and  below the patella from right to left knee.    Time  6    Period  Weeks    Status  New            Plan - 07/02/17 1553    Clinical Impression Statement  Pt arrived wearing compression tights 20-30 mmHg.  Reviewed purpose and educated proper donn/doffing (not complete this session but do have verbal understanding wiht technique).  Continued session focus iwht knee mobiltiy.  Began session with retro massage to addressed edema present proximal knee.  Therex focus on knee ROM and hip strengthening.  Pt able to complete all exercises with good from, noted fatige with activities.  Reviewed HEP compliance wiht additional exercises added for hip and quad strengthening.  AROM 0-100 degrees at EOS.      Rehab Potential  Fair    Clinical Impairments Affecting Rehab Potential  (+) motivation, (+) positive experience with PT for right TKA, (-) edema    PT Frequency  3x / week    PT Duration  6 weeks    PT Treatment/Interventions  ADLs/Self Care Home Management;Cryotherapy;Electrical Stimulation;DME Instruction;Gait training;Stair training;Functional mobility training;Therapeutic activities;Therapeutic exercise;Balance training;Neuromuscular re-education;Patient/family education;Manual techniques;Scar mobilization;Passive range of motion;Energy conservation;Taping    PT Next Visit Plan  Begin bike and continue focus wiht knee mobiltiy.  Include manual therapy for MFR scar tissue restrictions, joint mobs and edema control.  Review donn/doffing with compression hose.      PT Home Exercise Plan  Eval: heel raises, heel slides with flexion stretch; 07/02/2017: SLR, bridge and standing TKE.       Patient will benefit from skilled therapeutic intervention in order to improve the following deficits and impairments:  Decreased balance, Abnormal gait, Decreased endurance, Decreased mobility, Difficulty walking, Hypomobility, Decreased range of motion, Decreased scar mobility, Increased edema, Improper body mechanics,  Decreased activity tolerance, Decreased knowledge of use of DME, Decreased strength, Increased fascial restricitons, Impaired flexibility, Postural dysfunction  Visit Diagnosis: Left knee pain, unspecified chronicity  Stiffness of left knee, not elsewhere classified  Other abnormalities of gait and mobility  Muscle weakness (generalized)     Problem List Patient Active Problem List   Diagnosis Date Noted  . S/P total knee arthroplasty, left 04/29/17 05/06/2017  . Anemia 05/06/2017  . Primary osteoarthritis of left knee 04/29/2017  . Primary osteoarthritis of both knees   . Abnormal CT scan, colon 01/18/2016  . Dyspnea   . Abnormal myocardial perfusion study   . Fatigue 07/18/2014  . Vitamin D deficiency 07/18/2014  . Exercise intolerance 07/18/2014  . Sleep disorder 07/18/2014  . Nonspecific abnormal electrocardiogram (ECG) (EKG) 07/18/2014  . Reduced vision 12/27/2013  . Radicular pain of thoracic region 06/22/2013  . Prediabetes 10/20/2012  . Metabolic syndrome X 10/20/2012  . BACK PAIN WITH RADICULOPATHY 09/20/2008  . Hyperlipidemia LDL goal <100 03/29/2008  . Morbid obesity (HCC) 03/29/2008  . Essential hypertension 10/07/2007   Becky Sax, LPTA; CBIS [phone]  Juel Burrow 07/02/2017, 4:04 PM  [facility] [address] [place], [place], [address] Phone: [phone]   Fax:  [phone]  Name: Patricia Fuentes MRN: [id number] Date of Birth: 11/17/1945

## 2017-07-04 ENCOUNTER — Ambulatory Visit (HOSPITAL_COMMUNITY): Payer: Medicare HMO

## 2017-07-04 ENCOUNTER — Encounter (HOSPITAL_COMMUNITY): Payer: Self-pay

## 2017-07-04 DIAGNOSIS — M6281 Muscle weakness (generalized): Secondary | ICD-10-CM

## 2017-07-04 DIAGNOSIS — M25662 Stiffness of left knee, not elsewhere classified: Secondary | ICD-10-CM

## 2017-07-04 DIAGNOSIS — M25562 Pain in left knee: Secondary | ICD-10-CM

## 2017-07-04 DIAGNOSIS — R2689 Other abnormalities of gait and mobility: Secondary | ICD-10-CM | POA: Diagnosis not present

## 2017-07-04 NOTE — Therapy (Signed)
[facility] [address] [place], [place], [address] Phone: [phone]   Fax:  [phone]  Physical Therapy Treatment  Patient Details  Name: Patricia Fuentes MRN: [id number] Date of Birth: 1946/04/13 Referring Provider: Fuller Canada   Encounter Date: 07/04/2017  PT End of Session - 07/04/17 1258    Visit Number  5    Number of Visits  19    Date for PT Re-Evaluation  07/16/17    Authorization Type  [employer]    Authorization Time Period  06/25/17- 08/08/17    PT Start Time  1300    PT Stop Time  1347    PT Time Calculation (min)  47 min    Activity Tolerance  Patient tolerated treatment well;No increased pain    Behavior During Therapy  WFL for tasks assessed/performed       Past Medical History:  Diagnosis Date  . Bronchitis   . Chronic back pain   . Essential hypertension   . Hyperlipidemia   . IGT (impaired glucose tolerance) 2015  . Obesity, unspecified   . Osteoarthritis   . Seasonal allergies     Past Surgical History:  Procedure Laterality Date  . CARDIAC CATHETERIZATION N/A 10/25/2014   Procedure: Left Heart Cath and Coronary Angiography;  Surgeon: Kathleene Hazel, MD;  Location: [facility] INVASIVE CV LAB;  Service: Cardiovascular;  Laterality: N/A;  . COLONOSCOPY N/A 02/09/2016   Procedure: COLONOSCOPY;  Surgeon: West Bali, MD;  Location: [facility] ENDO SUITE;  Service: Endoscopy;  Laterality: N/A;  12:30 PM  . KNEE ARTHROSCOPY Left   . TOTAL ABDOMINAL HYSTERECTOMY  1995 approx   Fibroids  . TOTAL KNEE ARTHROPLASTY Right 2011  . TOTAL KNEE ARTHROPLASTY Left 04/29/2017   Procedure: TOTAL KNEE ARTHROPLASTY;  Surgeon: Vickki Hearing, MD;  Location: [facility] ORS;  Service: Orthopedics;  Laterality: Left;    There were no vitals filed for this visit.  Subjective Assessment - 07/04/17 1259    Subjective  Pt states she is wore out. She got fitted for some stockings and then went out to eat and that made her  tired.    Patient Stated Goals  be able to bend leg more and walk easier    Currently in Pain?  No/denies          [facility] Adult PT Treatment/Exercise - 07/04/17 0001      Knee/Hip Exercises: Stretches   Passive Hamstring Stretch  Left;3 reps;30 seconds    Passive Hamstring Stretch Limitations  standing, 8" step    Quad Stretch  3 reps;30 seconds    Quad Stretch Limitations  prone with rope    Knee: Self-Stretch Limitations  10x 10" on 12" box for flexion    Gastroc Stretch  Both;3 reps;30 seconds    Gastroc Stretch Limitations  slant board      Knee/Hip Exercises: Aerobic   Stationary Bike  x4 mins, seat 14, 1/2 revolutions for ROM      Knee/Hip Exercises: Standing   Knee Flexion  Right;15 reps    Terminal Knee Extension  AROM;Strengthening;Left;1 set;15 reps;Limitations    Terminal Knee Extension Limitations  Blue theraband, 5 second holds    Hip Abduction  Stengthening;Both;10 reps    Abduction Limitations  RTB    Rocker Board  2 minutes R/L    SLS  LLE only, 5x10" with 1 fingertip assist      Knee/Hip Exercises: Seated   Sit to Sand  10 reps;without UE  support 2" step under RLE for improved weight shift/WB thru LLE      Knee/Hip Exercises: Supine   Knee Extension Limitations  0    Knee Flexion Limitations  102      Knee/Hip Exercises: Prone   Hamstring Curl  10 reps    Hamstring Curl Limitations  2-3" holds      Manual Therapy   Manual Therapy  Edema management;Joint mobilization;Myofascial release    Manual therapy comments  Manual complete separate of rest of tx    Edema Management  Retro massage with LE elevated    Joint Mobilization  patella mobs all direcitons and tib/fib; AP glide to tibiofemoral for flexion, grade III3x 30-45 seconds each    Myofascial Release  to reduce scar tissue adhesions on incision           PT Education - 07/04/17 1301    Education provided  Yes    Education Details  exercise technique    Person(s) Educated  Patient    Methods   Explanation;Demonstration    Comprehension  Verbalized understanding;Returned demonstration       PT Short Term Goals - 06/25/17 1911      PT SHORT TERM GOAL #1   Title  Patient will be independent with HEP to demonstrate improved independence with exercise and advance functional strengthening.    Time  2    Period  Weeks    Status  New    Target Date  07/09/17      PT SHORT TERM GOAL #2   Title  Patient will have improve AROM into flexion for left knee by 8 degrees to demonstrate significant change and improvement in joint mobility for improved functional mobility wiht gait and stairs.    Time  3    Period  Weeks    Status  New    Target Date  07/16/17      PT SHORT TERM GOAL #3   Title  Patient will demonstrate improved MMT for limited muscle groups by 1/2 grade to improve functional strength for improve gait quality and greater muscular endurance with functional activity.    Time  3    Period  Weeks    Status  New      PT SHORT TERM GOAL #4   Title  Patient will perform TUG wiht LRAD in less than or equal to 12 seconds to reduce fall risk during dynamic gait activitites.    Time  3    Period  Weeks    Status  New        PT Long Term Goals - 06/25/17 1915      PT LONG TERM GOAL #1   Title  Patient will have improve AROM into flexion for left knee to 110 degrees to have improvement in joint mobility for improved functional mobility wiht gait and stairs.    Time  6    Period  Weeks    Status  New    Target Date  08/06/17      PT LONG TERM GOAL #2   Title  Patient will demonstrate improved MMT for limited muscle groups by 1 grade to improve functional strength for improve gait quality and greater muscular endurance with functional activity.    Time  6    Period  Weeks    Status  New      PT LONG TERM GOAL #3   Title  Patient will improve 5x sit to stand testing in less  than 15 seconds to demosntrate significant improvement in bil LE strength and decrease risk of  falling.    Time  6    Period  Weeks    Status  New      PT LONG TERM GOAL #4   Title  Patient will have decreased circumferential edema measurement to be no more than 1 cm greater above and below the patella from right to left knee.    Time  6    Period  Weeks    Status  New            Plan - 07/04/17 1350    Clinical Impression Statement  Continued with established POC. Added the bike for ROM, rockerboard, SLS, and standing abd this date with good tolerance. Pt denied any pain during session, just muscle fatigue. Ended with manual to address edema, soft tissue restrictions, and joint hypomobility. Pt's AROM progressing nicely as she was 0-102deg.    Rehab Potential  Fair    Clinical Impairments Affecting Rehab Potential  (+) motivation, (+) positive experience with PT for right TKA, (-) edema    PT Frequency  3x / week    PT Duration  6 weeks    PT Treatment/Interventions  ADLs/Self Care Home Management;Cryotherapy;Electrical Stimulation;DME Instruction;Gait training;Stair training;Functional mobility training;Therapeutic activities;Therapeutic exercise;Balance training;Neuromuscular re-education;Patient/family education;Manual techniques;Scar mobilization;Passive range of motion;Energy conservation;Taping    PT Next Visit Plan  Continue with bike and continue focus with knee mobiltiy.  Include manual therapy for MFR scar tissue restrictions, joint mobs and edema control.  Review donn/doffing with compression hose as needed    PT Home Exercise Plan  Eval: heel raises, heel slides with flexion stretch; 07/02/2017: SLR, bridge and standing TKE.    Consulted and Agree with Plan of Care  Patient       Patient will benefit from skilled therapeutic intervention in order to improve the following deficits and impairments:  Decreased balance, Abnormal gait, Decreased endurance, Decreased mobility, Difficulty walking, Hypomobility, Decreased range of motion, Decreased scar mobility, Increased  edema, Improper body mechanics, Decreased activity tolerance, Decreased knowledge of use of DME, Decreased strength, Increased fascial restricitons, Impaired flexibility, Postural dysfunction  Visit Diagnosis: Left knee pain, unspecified chronicity  Stiffness of left knee, not elsewhere classified  Other abnormalities of gait and mobility  Muscle weakness (generalized)     Problem List Patient Active Problem List   Diagnosis Date Noted  . S/P total knee arthroplasty, left 04/29/17 05/06/2017  . Anemia 05/06/2017  . Primary osteoarthritis of left knee 04/29/2017  . Primary osteoarthritis of both knees   . Abnormal CT scan, colon 01/18/2016  . Dyspnea   . Abnormal myocardial perfusion study   . Fatigue 07/18/2014  . Vitamin D deficiency 07/18/2014  . Exercise intolerance 07/18/2014  . Sleep disorder 07/18/2014  . Nonspecific abnormal electrocardiogram (ECG) (EKG) 07/18/2014  . Reduced vision 12/27/2013  . Radicular pain of thoracic region 06/22/2013  . Prediabetes 10/20/2012  . Metabolic syndrome X 10/20/2012  . BACK PAIN WITH RADICULOPATHY 09/20/2008  . Hyperlipidemia LDL goal <100 03/29/2008  . Morbid obesity (HCC) 03/29/2008  . Essential hypertension 10/07/2007       Jac CanavanBrooke Jermeka Schlotterbeck PT, DPT   [facility] [address] [place], [place], [address] Phone: [phone]   Fax:  [phone]  Name: Laban EmperorSylvia A Hewson MRN: [id number] Date of Birth: 1945/10/31

## 2017-07-05 ENCOUNTER — Emergency Department (HOSPITAL_COMMUNITY)
Admission: EM | Admit: 2017-07-05 | Discharge: 2017-07-05 | Disposition: A | Discharge status: Home / Self Care | Payer: Medicare HMO | Attending: Emergency Medicine | Admitting: Emergency Medicine

## 2017-07-05 ENCOUNTER — Other Ambulatory Visit: Payer: Self-pay

## 2017-07-05 ENCOUNTER — Emergency Department (HOSPITAL_COMMUNITY): Payer: Medicare HMO

## 2017-07-05 ENCOUNTER — Encounter (HOSPITAL_COMMUNITY): Payer: Self-pay | Admitting: Emergency Medicine

## 2017-07-05 DIAGNOSIS — R062 Wheezing: Secondary | ICD-10-CM | POA: Diagnosis not present

## 2017-07-05 DIAGNOSIS — R0789 Other chest pain: Secondary | ICD-10-CM | POA: Diagnosis not present

## 2017-07-05 DIAGNOSIS — I10 Essential (primary) hypertension: Secondary | ICD-10-CM | POA: Diagnosis not present

## 2017-07-05 DIAGNOSIS — Z7982 Long term (current) use of aspirin: Secondary | ICD-10-CM | POA: Insufficient documentation

## 2017-07-05 DIAGNOSIS — R0602 Shortness of breath: Secondary | ICD-10-CM | POA: Diagnosis not present

## 2017-07-05 DIAGNOSIS — Z96653 Presence of artificial knee joint, bilateral: Secondary | ICD-10-CM | POA: Insufficient documentation

## 2017-07-05 DIAGNOSIS — J189 Pneumonia, unspecified organism: Secondary | ICD-10-CM | POA: Diagnosis not present

## 2017-07-05 DIAGNOSIS — R05 Cough: Secondary | ICD-10-CM | POA: Diagnosis not present

## 2017-07-05 LAB — CBC WITH DIFFERENTIAL/PLATELET
Basophils Absolute: 0 10*3/uL (ref 0.0–0.1)
Basophils Relative: 0 %
EOS ABS: 0.4 10*3/uL (ref 0.0–0.7)
EOS PCT: 8 %
HCT: 33.8 % — ABNORMAL LOW (ref 36.0–46.0)
Hemoglobin: 10.2 g/dL — ABNORMAL LOW (ref 12.0–15.0)
LYMPHS ABS: 1.6 10*3/uL (ref 0.7–4.0)
LYMPHS PCT: 31 %
MCH: 26.4 pg (ref 26.0–34.0)
MCHC: 30.2 g/dL (ref 30.0–36.0)
MCV: 87.6 fL (ref 78.0–100.0)
MONOS PCT: 11 %
Monocytes Absolute: 0.6 10*3/uL (ref 0.1–1.0)
Neutro Abs: 2.6 10*3/uL (ref 1.7–7.7)
Neutrophils Relative %: 50 %
PLATELETS: 192 10*3/uL (ref 150–400)
RBC: 3.86 MIL/uL — AB (ref 3.87–5.11)
RDW: 14.7 % (ref 11.5–15.5)
WBC: 5.2 10*3/uL (ref 4.0–10.5)

## 2017-07-05 LAB — BASIC METABOLIC PANEL
Anion gap: 9 (ref 5–15)
BUN: 8 mg/dL (ref 6–20)
CHLORIDE: 98 mmol/L — AB (ref 101–111)
CO2: 33 mmol/L — AB (ref 22–32)
CREATININE: 0.63 mg/dL (ref 0.44–1.00)
Calcium: 8.4 mg/dL — ABNORMAL LOW (ref 8.9–10.3)
GFR calc Af Amer: >60 mL/min (ref >60)
GFR calc non Af Amer: >60 mL/min (ref >60)
GLUCOSE: 103 mg/dL — AB (ref 65–99)
POTASSIUM: 3.3 mmol/L — AB (ref 3.5–5.1)
Sodium: 140 mmol/L (ref 135–145)

## 2017-07-05 LAB — LACTIC ACID, PLASMA
Lactic Acid, Venous: 0.7 mmol/L (ref 0.5–1.9)
Lactic Acid, Venous: 0.8 mmol/L (ref 0.5–1.9)

## 2017-07-05 LAB — TROPONIN I: Troponin I: <0.03 ng/mL (ref <0.03)

## 2017-07-05 MED ORDER — DOXYCYCLINE HYCLATE 100 MG PO TABS
100.0000 mg | ORAL_TABLET | Freq: Once | ORAL | Status: AC
  Administered 2017-07-05: 100 mg via ORAL
  Filled 2017-07-05: qty 1

## 2017-07-05 MED ORDER — ALBUTEROL SULFATE (2.5 MG/3ML) 0.083% IN NEBU
2.5000 mg | INHALATION_SOLUTION | Freq: Once | RESPIRATORY_TRACT | Status: AC
  Administered 2017-07-05: 2.5 mg via RESPIRATORY_TRACT
  Filled 2017-07-05: qty 3

## 2017-07-05 MED ORDER — IPRATROPIUM-ALBUTEROL 0.5-2.5 (3) MG/3ML IN SOLN
3.0000 mL | Freq: Once | RESPIRATORY_TRACT | Status: AC
  Administered 2017-07-05: 3 mL via RESPIRATORY_TRACT
  Filled 2017-07-05: qty 3

## 2017-07-05 MED ORDER — SODIUM CHLORIDE 0.9 % IV BOLUS (SEPSIS)
500.0000 mL | Freq: Once | INTRAVENOUS | Status: AC
  Administered 2017-07-05: 500 mL via INTRAVENOUS

## 2017-07-05 MED ORDER — ALBUTEROL SULFATE HFA 108 (90 BASE) MCG/ACT IN AERS
2.0000 | INHALATION_SPRAY | Freq: Once | RESPIRATORY_TRACT | Status: AC
  Administered 2017-07-05: 2 via RESPIRATORY_TRACT
  Filled 2017-07-05: qty 6.7

## 2017-07-05 MED ORDER — DOXYCYCLINE HYCLATE 100 MG PO CAPS: 100.0000 mg | ORAL_CAPSULE | Freq: Two times a day (BID) | ORAL | 0 refills | Status: DC

## 2017-07-05 NOTE — ED Triage Notes (Signed)
Pt reports cough 2-3 weeks. Pt reports intermittent production, no known fevers, diarrhea. nad noted. Pt reports shortness of breath with exertion. Pt room air saturation 90%. Pt placed on 2 liters, pt now 98%.

## 2017-07-05 NOTE — ED Provider Notes (Signed)
[facility] EMERGENCY DEPARTMENT Provider Note   CSN: [id number] Arrival date & time: 07/05/17  0754     History   Chief Complaint Chief Complaint  Patient presents with  . Cough    HPI Patricia Fuentes is a 72 y.o. female.  HPI   Patricia Fuentes is a 72 y.o. female who presents to the Emergency Department complaining of intermittent cough for 2-3 weeks, nasal congestion and generalized body aches.  She states the cough has been intermittently productive of yellow sputum.  She has been taking over-the-counter cough medicine without relief.  She describes a few episodes of posttussive emesis and heaviness to her upper chest associated with coughing.  She notes some intermittent wheezing as well, and shortness of breath with exertion.  She denies fever, peripheral edema, COPD abdominal pain.  Past Medical History:  Diagnosis Date  . Bronchitis   . Chronic back pain   . Essential hypertension   . Hyperlipidemia   . IGT (impaired glucose tolerance) 2015  . Obesity, unspecified   . Osteoarthritis   . Seasonal allergies     Patient Active Problem List   Diagnosis Date Noted  . S/P total knee arthroplasty, left 04/29/17 05/06/2017  . Anemia 05/06/2017  . Primary osteoarthritis of left knee 04/29/2017  . Primary osteoarthritis of both knees   . Abnormal CT scan, colon 01/18/2016  . Dyspnea   . Abnormal myocardial perfusion study   . Fatigue 07/18/2014  . Vitamin D deficiency 07/18/2014  . Exercise intolerance 07/18/2014  . Sleep disorder 07/18/2014  . Nonspecific abnormal electrocardiogram (ECG) (EKG) 07/18/2014  . Reduced vision 12/27/2013  . Radicular pain of thoracic region 06/22/2013  . Prediabetes 10/20/2012  . Metabolic syndrome X 10/20/2012  . BACK PAIN WITH RADICULOPATHY 09/20/2008  . Hyperlipidemia LDL goal <100 03/29/2008  . Morbid obesity (HCC) 03/29/2008  . Essential hypertension 10/07/2007    Past Surgical History:  Procedure Laterality Date  .  CARDIAC CATHETERIZATION N/A 10/25/2014   Procedure: Left Heart Cath and Coronary Angiography;  Surgeon: Kathleene Hazel, MD;  Location: [facility] INVASIVE CV LAB;  Service: Cardiovascular;  Laterality: N/A;  . COLONOSCOPY N/A 02/09/2016   Procedure: COLONOSCOPY;  Surgeon: West Bali, MD;  Location: [facility]O SUITE;  Service: Endoscopy;  Laterality: N/A;  12:30 PM  . KNEE ARTHROSCOPY Left   . TOTAL ABDOMINAL HYSTERECTOMY  1995 approx   Fibroids  . TOTAL KNEE ARTHROPLASTY Right 2011  . TOTAL KNEE ARTHROPLASTY Left 04/29/2017   Procedure: TOTAL KNEE ARTHROPLASTY;  Surgeon: Vickki Hearing, MD;  Location: [facility] ORS;  Service: Orthopedics;  Laterality: Left;    OB History    No data available       Home Medications    Prior to Admission medications   Medication Sig Start Date End Date Taking? Authorizing Provider  amLODipine (NORVASC) 2.5 MG tablet Take 1 tablet (2.5 mg total) by mouth daily. 05/29/17   Kerri Perches, MD  amLODipine (NORVASC) 5 MG tablet Take 1 tablet (5 mg total) by mouth daily. 01/02/16   Kerri Perches, MD  aspirin EC 81 MG tablet Take 81 mg by mouth 2 (two) times daily.    [provider]  ferrous sulfate (IRON SUPPLEMENT) 325 (65 FE) MG tablet Take 325 mg by mouth 2 (two) times daily with a meal.    [provider]  Vitamin D, Cholecalciferol, 1000 units TABS Take 1,000 Units by mouth daily.    [provider]  Family History Family History  Problem Relation Age of Onset  . Hypertension Mother   . Stroke Mother 65       Deceased due CVa at 15  . Diabetes Father   . Cancer Father 50       Unknown, somewhere in stomach  . Hypertension Brother   . Hypertension Brother   . Hypertension Brother   . Cancer Maternal Grandfather        ?type  . Hypertension Son     Social History Social History   Tobacco Use  . Smoking status: Never Smoker  . Smokeless tobacco: Never Used  Substance Use Topics  . Alcohol use: No  .  Drug use: No     Allergies   Promethazine hcl   Review of Systems Review of Systems  Constitutional: Negative for appetite change, chills and fever.  HENT: Positive for congestion. Negative for sore throat and trouble swallowing.   Respiratory: Positive for cough, chest tightness, shortness of breath and wheezing.   Cardiovascular: Positive for chest pain. Negative for leg swelling.  Gastrointestinal: Negative for abdominal pain, nausea and vomiting.  Genitourinary: Negative for dysuria.  Musculoskeletal: Positive for myalgias. Negative for arthralgias.  Skin: Negative for rash.  Neurological: Negative for dizziness, weakness, numbness and headaches.  Hematological: Negative for adenopathy.  Psychiatric/Behavioral: Negative for confusion.  All other systems reviewed and are negative.    Physical Exam Updated Vital Signs BP (!) 146/65 (BP Location: Left Arm)   Temp 98.5 F (36.9 C) (Oral)   Resp 18   Ht 5\' 2"  (1.575 m)   Wt 110.7 kg (244 lb)   SpO2 98%   BMI 44.63 kg/m   Physical Exam  Constitutional: She is oriented to person, place, and time. She appears well-developed and well-nourished. No distress.  HENT:  Head: Atraumatic.  Mouth/Throat: Oropharynx is clear and moist.  Neck: Normal range of motion. No JVD present.  Cardiovascular: Normal rate, regular rhythm and intact distal pulses.  No murmur heard. Pulmonary/Chest: Effort normal. No respiratory distress. She has wheezes. She exhibits no tenderness.  Few inspiratory and expiratory wheezes bilaterally.  Patient able to speak in full and complete sentences without respiratory distress.  Abdominal: Soft. She exhibits no distension and no mass. There is no tenderness. There is no guarding.  Musculoskeletal: Normal range of motion. She exhibits no edema.  Neurological: She is alert and oriented to person, place, and time.  Skin: Skin is warm. Capillary refill takes less than 2 seconds. No rash noted.  Nursing note  and vitals reviewed.    ED Treatments / Results  Labs (all labs ordered are listed, but only abnormal results are displayed) Labs Reviewed  BASIC METABOLIC PANEL - Abnormal; Notable for the following components:      Result Value   Potassium 3.3 (*)    Chloride 98 (*)    CO2 33 (*)    Glucose, Bld 103 (*)    Calcium 8.4 (*)    All other components within normal limits  CBC WITH DIFFERENTIAL/PLATELET - Abnormal; Notable for the following components:   RBC 3.86 (*)    Hemoglobin 10.2 (*)    HCT 33.8 (*)    All other components within normal limits  TROPONIN I  LACTIC ACID, PLASMA  LACTIC ACID, PLASMA    EKG  EKG Interpretation  Date/Time:  Saturday July 05 2017 08:10:19 EST Ventricular Rate:  81 PR Interval:    QRS Duration: 100 QT Interval:  396 QTC Calculation:  460 R Axis:   83 Text Interpretation:  Sinus rhythm Borderline right axis deviation When compared with ECG of 04/25/2017 No significant change was found Confirmed by Samuel JesterMcManus, Kathleen [phone]([id number]) on 07/05/2017 9:27:33 AM       Radiology Dg Chest 2 View  Result Date: 07/05/2017 CLINICAL DATA:  Shortness of breath and cough for 2 weeks EXAM: CHEST  2 VIEW COMPARISON:  04/25/2017 FINDINGS: Borderline cardiomegaly. There is new patchy density in the retrocardiac lung and right perihilar region. No Kerley lines, effusion, or air leak. Mild aortic tortuosity. IMPRESSION: Patchy bilateral lung opacity primarily concerning for bronchopneumonia. Electronically Signed   By: Marnee SpringJonathon  Watts M.D.   On: 07/05/2017 08:55    Procedures Procedures (including critical care time)  Medications Ordered in ED Medications  sodium chloride 0.9 % bolus 500 mL (0 mLs Intravenous Stopped 07/05/17 1241)  ipratropium-albuterol (DUONEB) 0.5-2.5 (3) MG/3ML nebulizer solution 3 mL (3 mLs Nebulization Given 07/05/17 1014)  albuterol (PROVENTIL) (2.5 MG/3ML) 0.083% nebulizer solution 2.5 mg (2.5 mg Nebulization Given 07/05/17 1014)  doxycycline  (VIBRA-TABS) tablet 100 mg (100 mg Oral Given 07/05/17 1228)  albuterol (PROVENTIL HFA;VENTOLIN HFA) 108 (90 Base) MCG/ACT inhaler 2 puff (2 puffs Inhalation Given 07/05/17 1317)     Initial Impression / Assessment and Plan / ED Course  I have reviewed the triage vital signs and the nursing notes.  Pertinent labs & imaging results that were available during my care of the patient were reviewed by me and considered in my medical decision making (see chart for details).     Patient nontoxic-appearing.  Laboratory studies are reassuring, hemoglobin values similar to previous from 1 month ago. On recheck, patient reports feeling better and lung sounds have improved after albuterol neb.  Patient is ambulated in the department, O2 sat remains at 92% with ambulation.  Chest x-ray showing likely pneumonia, no hypoxia tachypnea or tachycardia.  Patient prefers to try outpatient therapy, discussed strict return precautions.  Patient agrees to treatment plan, albuterol inhaler dispensed for home use.  She also agrees to close follow-up with PCP to ensure resolution.   Final Clinical Impressions(s) / ED Diagnoses   Final diagnoses:  Community acquired pneumonia, unspecified laterality    ED Discharge Orders    None       Pauline Ausriplett, Thang Flett, PA-C 07/06/17 2109    Samuel JesterMcManus, Kathleen, DO 07/07/17 [phone]

## 2017-07-05 NOTE — Discharge Instructions (Signed)
Drink plenty of fluids.  Take Tylenol every 4 hours as needed for fever.  1-2 puffs of the albuterol inhaler every 4-6 hours as needed.  Take the antibiotic as directed and until its completed and follow-up with your primary doctor later this week for recheck.  Return to the ER for any worsening symptoms such as persistent fever, vomiting, chest pain or shortness of breath.

## 2017-07-05 NOTE — ED Notes (Addendum)
Pt ambulated around nurses station. Pt ambulated with steady gait on room air. Pt 02 saturation 92%. Primary RN and PA aware.

## 2017-07-07 ENCOUNTER — Telehealth (HOSPITAL_COMMUNITY): Payer: Self-pay

## 2017-07-07 ENCOUNTER — Ambulatory Visit (HOSPITAL_COMMUNITY): Payer: Medicare HMO

## 2017-07-07 NOTE — Telephone Encounter (Signed)
Patient cancel her appt she is not feeling well and will see us on 07-09-17

## 2017-07-09 ENCOUNTER — Emergency Department (HOSPITAL_COMMUNITY): Payer: Medicare HMO

## 2017-07-09 ENCOUNTER — Emergency Department (HOSPITAL_COMMUNITY)
Admission: EM | Admit: 2017-07-09 | Discharge: 2017-07-09 | Disposition: A | Discharge status: Home / Self Care | Payer: Medicare HMO | Attending: Emergency Medicine | Admitting: Emergency Medicine

## 2017-07-09 ENCOUNTER — Other Ambulatory Visit: Payer: Self-pay

## 2017-07-09 ENCOUNTER — Telehealth (HOSPITAL_COMMUNITY): Payer: Self-pay

## 2017-07-09 ENCOUNTER — Ambulatory Visit (HOSPITAL_COMMUNITY): Payer: Medicare HMO

## 2017-07-09 ENCOUNTER — Encounter (HOSPITAL_COMMUNITY): Payer: Self-pay | Admitting: *Deleted

## 2017-07-09 DIAGNOSIS — Z79899 Other long term (current) drug therapy: Secondary | ICD-10-CM | POA: Insufficient documentation

## 2017-07-09 DIAGNOSIS — R05 Cough: Secondary | ICD-10-CM | POA: Diagnosis not present

## 2017-07-09 DIAGNOSIS — R0602 Shortness of breath: Secondary | ICD-10-CM | POA: Diagnosis not present

## 2017-07-09 DIAGNOSIS — I10 Essential (primary) hypertension: Secondary | ICD-10-CM | POA: Diagnosis not present

## 2017-07-09 DIAGNOSIS — J189 Pneumonia, unspecified organism: Secondary | ICD-10-CM | POA: Diagnosis not present

## 2017-07-09 DIAGNOSIS — Z7982 Long term (current) use of aspirin: Secondary | ICD-10-CM | POA: Diagnosis not present

## 2017-07-09 DIAGNOSIS — E785 Hyperlipidemia, unspecified: Secondary | ICD-10-CM | POA: Diagnosis not present

## 2017-07-09 DIAGNOSIS — E876 Hypokalemia: Secondary | ICD-10-CM | POA: Diagnosis not present

## 2017-07-09 LAB — CBC WITH DIFFERENTIAL/PLATELET
BASOS ABS: 0 10*3/uL (ref 0.0–0.1)
Basophils Relative: 0 %
EOS ABS: 0.3 10*3/uL (ref 0.0–0.7)
EOS PCT: 4 %
HCT: 34.2 % — ABNORMAL LOW (ref 36.0–46.0)
HEMOGLOBIN: 10.2 g/dL — AB (ref 12.0–15.0)
Lymphocytes Relative: 23 %
Lymphs Abs: 1.6 10*3/uL (ref 0.7–4.0)
MCH: 25.9 pg — ABNORMAL LOW (ref 26.0–34.0)
MCHC: 29.8 g/dL — ABNORMAL LOW (ref 30.0–36.0)
MCV: 86.8 fL (ref 78.0–100.0)
Monocytes Absolute: 0.6 10*3/uL (ref 0.1–1.0)
Monocytes Relative: 8 %
NEUTROS PCT: 65 %
Neutro Abs: 4.5 10*3/uL (ref 1.7–7.7)
PLATELETS: 227 10*3/uL (ref 150–400)
RBC: 3.94 MIL/uL (ref 3.87–5.11)
RDW: 14.9 % (ref 11.5–15.5)
WBC: 7 10*3/uL (ref 4.0–10.5)

## 2017-07-09 LAB — BASIC METABOLIC PANEL
ANION GAP: 9 (ref 5–15)
BUN: 11 mg/dL (ref 6–20)
CALCIUM: 8.5 mg/dL — AB (ref 8.9–10.3)
CO2: 32 mmol/L (ref 22–32)
CREATININE: 0.7 mg/dL (ref 0.44–1.00)
Chloride: 98 mmol/L — ABNORMAL LOW (ref 101–111)
Glucose, Bld: 114 mg/dL — ABNORMAL HIGH (ref 65–99)
Potassium: 2.9 mmol/L — ABNORMAL LOW (ref 3.5–5.1)
SODIUM: 139 mmol/L (ref 135–145)

## 2017-07-09 LAB — BRAIN NATRIURETIC PEPTIDE: B NATRIURETIC PEPTIDE 5: 59 pg/mL (ref 0.0–100.0)

## 2017-07-09 LAB — D-DIMER, QUANTITATIVE (NOT AT ARMC): D DIMER QUANT: 2.33 ug{FEU}/mL — AB (ref 0.00–0.50)

## 2017-07-09 MED ORDER — IOPAMIDOL (ISOVUE-370) INJECTION 76%
100.0000 mL | Freq: Once | INTRAVENOUS | Status: AC | PRN
  Administered 2017-07-09: 100 mL via INTRAVENOUS

## 2017-07-09 MED ORDER — MOXIFLOXACIN HCL 400 MG PO TABS: 400.0000 mg | ORAL_TABLET | Freq: Every day | ORAL | 0 refills | Status: DC

## 2017-07-09 MED ORDER — IPRATROPIUM-ALBUTEROL 0.5-2.5 (3) MG/3ML IN SOLN
3.0000 mL | Freq: Once | RESPIRATORY_TRACT | Status: AC
  Administered 2017-07-09: 3 mL via RESPIRATORY_TRACT
  Filled 2017-07-09: qty 3

## 2017-07-09 MED ORDER — POTASSIUM CHLORIDE 20 MEQ/15ML (10%) PO SOLN
40.0000 meq | Freq: Once | ORAL | Status: AC
  Administered 2017-07-09: 40 meq via ORAL
  Filled 2017-07-09: qty 30

## 2017-07-09 MED ORDER — LEVOFLOXACIN 500 MG PO TABS
500.0000 mg | ORAL_TABLET | Freq: Once | ORAL | Status: AC
  Administered 2017-07-09: 500 mg via ORAL
  Filled 2017-07-09: qty 1

## 2017-07-09 MED ORDER — PREDNISONE 50 MG PO TABS: 50.0000 mg | ORAL_TABLET | Freq: Every day | ORAL | 0 refills | Status: DC

## 2017-07-09 MED ORDER — POTASSIUM CHLORIDE CRYS ER 20 MEQ PO TBCR: 40.0000 meq | EXTENDED_RELEASE_TABLET | Freq: Once | ORAL | Status: DC

## 2017-07-09 MED ORDER — PREDNISONE 50 MG PO TABS
60.0000 mg | ORAL_TABLET | Freq: Once | ORAL | Status: AC
  Administered 2017-07-09: 60 mg via ORAL
  Filled 2017-07-09: qty 1

## 2017-07-09 NOTE — ED Triage Notes (Signed)
Pt c/o sob and pain to upper back; Pt was seen here in the ED x 4 days ago and was diagnosed with pneumonia; pt states since then she has become more sob with upper back pain

## 2017-07-09 NOTE — ED Provider Notes (Signed)
[facility] Provider Note   CSN: [id number] Arrival date & time: 07/09/17  [id number]     History   Chief Complaint Chief Complaint  Patient presents with  . Shortness of Breath    HPI Patricia Fuentes is a 72 y.o. female.  The history is provided by the patient.  She has a history of hypertension, hyperlipidemia, and anemia and comes in with worsening shortness of breath.  She had been in the emergency department 4 days ago and diagnosed with pneumonia and discharged with prescription for doxycycline which she has been taking.  She initially noted some improvement, but she has become more short of breath over the last 24 hours.  She was unable to get her breath at all tonight.  She continues to have a cough which is mostly nonproductive.  There is occasionally a small amount of clear sputum.  She denies fever, chills, sweats.  She denies chest pain, heaviness, tightness, pressure.  She has noted that dyspnea is worse when she lays flat.  She has had left leg swelling since having left knee replacement in December.  Past Medical History:  Diagnosis Date  . Bronchitis   . Chronic back pain   . Essential hypertension   . Hyperlipidemia   . IGT (impaired glucose tolerance) 2015  . Obesity, unspecified   . Osteoarthritis   . Seasonal allergies     Patient Active Problem List   Diagnosis Date Noted  . S/P total knee arthroplasty, left 04/29/17 05/06/2017  . Anemia 05/06/2017  . Primary osteoarthritis of left knee 04/29/2017  . Primary osteoarthritis of both knees   . Abnormal CT scan, colon 01/18/2016  . Dyspnea   . Abnormal myocardial perfusion study   . Fatigue 07/18/2014  . Vitamin D deficiency 07/18/2014  . Exercise intolerance 07/18/2014  . Sleep disorder 07/18/2014  . Nonspecific abnormal electrocardiogram (ECG) (EKG) 07/18/2014  . Reduced vision 12/27/2013  . Radicular pain of thoracic region 06/22/2013  . Prediabetes 10/20/2012  . Metabolic  syndrome X 10/20/2012  . BACK PAIN WITH RADICULOPATHY 09/20/2008  . Hyperlipidemia LDL goal <100 03/29/2008  . Morbid obesity (HCC) 03/29/2008  . Essential hypertension 10/07/2007    Past Surgical History:  Procedure Laterality Date  . CARDIAC CATHETERIZATION N/A 10/25/2014   Procedure: Left Heart Cath and Coronary Angiography;  Surgeon: Kathleene Hazelhristopher D McAlhany, MD;  [facility];  Service: Cardiovascular;  Laterality: N/A;  . COLONOSCOPY N/A 02/09/2016   Procedure: COLONOSCOPY;  Surgeon: West BaliSandi L Fields, MD;  Location: [facility] ENDO SUITE;  Service: Endoscopy;  Laterality: N/A;  12:30 PM  . KNEE ARTHROSCOPY Left   . TOTAL ABDOMINAL HYSTERECTOMY  1995 approx   Fibroids  . TOTAL KNEE ARTHROPLASTY Right 2011  . TOTAL KNEE ARTHROPLASTY Left 04/29/2017   Procedure: TOTAL KNEE ARTHROPLASTY;  Surgeon: Vickki HearingHarrison, Stanley E, MD;  Location: [facility] ORS;  Service: Orthopedics;  Laterality: Left;    OB History    No data available       Home Medications    Prior to Admission medications   Medication Sig Start Date End Date Taking? Authorizing Provider  amLODipine (NORVASC) 2.5 MG tablet Take 1 tablet (2.5 mg total) by mouth daily. 05/29/17   Kerri PerchesSimpson, Margaret E, MD  amLODipine (NORVASC) 5 MG tablet Take 1 tablet (5 mg total) by mouth daily. 01/02/16   Kerri PerchesSimpson, Margaret E, MD  aspirin EC 81 MG tablet Take 81 mg by mouth 2 (two) times daily.    [provider]  doxycycline (VIBRAMYCIN) 100 MG capsule Take 1 capsule (100 mg total) by mouth 2 (two) times daily. For 10 days 07/05/17   Triplett, Tammy, PA-C  ferrous sulfate (IRON SUPPLEMENT) 325 (65 FE) MG tablet Take 325 mg by mouth 2 (two) times daily with a meal.    [provider]  Menthol, Topical Analgesic, (BENGAY EX) Apply 1 application topically daily as needed (shoulder pain).    [provider]  naproxen sodium (ALEVE) 220 MG tablet Take 220 mg by mouth daily as needed (for knee pain).    [provider]    Vitamin D, Cholecalciferol, 1000 units TABS Take 1,000 Units by mouth daily.    [provider]    Family History Family History  Problem Relation Age of Onset  . Hypertension Mother   . Stroke Mother 51       Deceased due CVa at 75  . Diabetes Father   . Cancer Father 68       Unknown, somewhere in stomach  . Hypertension Brother   . Hypertension Brother   . Hypertension Brother   . Cancer Maternal Grandfather        ?type  . Hypertension Son     Social History Social History   Tobacco Use  . Smoking status: Never Smoker  . Smokeless tobacco: Never Used  Substance Use Topics  . Alcohol use: No  . Drug use: No     Allergies   Promethazine hcl   Review of Systems Review of Systems  All other systems reviewed and are negative.    Physical Exam Updated Vital Signs BP (!) 154/73 (BP Location: Right Arm)   Pulse 78   Temp 98.2 F (36.8 C) (Oral)   Resp (!) 22   Ht 5\' 2"  (1.575 m)   Wt 110.7 kg (244 lb)   SpO2 90%   BMI 44.63 kg/m   Physical Exam  Nursing note and vitals reviewed.  72 year old female, resting comfortably and in no acute distress. Vital signs are significant for elevated systolic blood pressure and mild tachypnea. Oxygen saturation is 90%, which is hypoxic. Head is normocephalic and atraumatic. PERRLA, EOMI. Oropharynx is clear. Neck is nontender and supple without adenopathy or JVD. Back is nontender and there is no CVA tenderness. Lungs have faint wheezes in the upper lung fields without rales or rhonchi. Chest is nontender. Heart has regular rate and rhythm without murmur. Abdomen is soft, flat, nontender without masses or hepatosplenomegaly and peristalsis is normoactive. Extremities have 1+ pretibial edema on the right and 2+ pretibial edema on the left, full range of motion is present. Skin is warm and dry without rash. Neurologic: Mental status is normal, cranial nerves are intact, there are no motor or sensory  deficits.  ED Treatments / Results  Labs (all labs ordered are listed, but only abnormal results are displayed) Labs Reviewed  BASIC METABOLIC PANEL - Abnormal; Notable for the following components:      Result Value   Potassium 2.9 (*)    Chloride 98 (*)    Glucose, Bld 114 (*)    Calcium 8.5 (*)    All other components within normal limits  CBC WITH DIFFERENTIAL/PLATELET - Abnormal; Notable for the following components:   Hemoglobin 10.2 (*)    HCT 34.2 (*)    MCH 25.9 (*)    MCHC 29.8 (*)    All other components within normal limits  D-DIMER, QUANTITATIVE (NOT AT [facility]) - Abnormal;  Notable for the following components:   D-Dimer, Quant 2.33 (*)    All other components within normal limits  BRAIN NATRIURETIC PEPTIDE    EKG  EKG Interpretation  Date/Time:  Wednesday July 09 2017 06:15:40 EST Ventricular Rate:  78 PR Interval:    QRS Duration: 93 QT Interval:  417 QTC Calculation: 475 R Axis:   99 Text Interpretation:  Sinus rhythm Right axis deviation Baseline wander in lead(s) V2 When compared with ECG of 07/05/2017, No significant change was found Confirmed by Dione Booze ([id number]) on 07/09/2017 6:34:03 AM       Radiology Dg Chest 2 View  Result Date: 07/09/2017 CLINICAL DATA:  Cough and shortness of breath. EXAM: CHEST  2 VIEW COMPARISON:  07/05/2017 FINDINGS: Borderline mild cardiomegaly. Unchanged mediastinal contours. Persistent bilateral ill-defined primarily perihilar opacities, greatest in the left infrahilar lung. Possible trace pleural effusions. No pneumothorax. No acute osseous abnormalities. IMPRESSION: Persistent unchanged bilateral ill-defined bilateral lung opacities over the past 4 days, suspicious for multifocal pneumonia. Electronically Signed   By: Rubye Oaks M.D.   On: 07/09/2017 06:50   Ct Angio Chest Pe W And/or Wo Contrast  Result Date: 07/09/2017 CLINICAL DATA:  Shortness of breath and chest pain EXAM: CT ANGIOGRAPHY CHEST WITH CONTRAST  TECHNIQUE: Multidetector CT imaging of the chest was performed using the standard protocol during bolus administration of intravenous contrast. Multiplanar CT image reconstructions and MIPs were obtained to evaluate the vascular anatomy. CONTRAST:  ISOVUE-370 IOPAMIDOL (ISOVUE-370) INJECTION 76% COMPARISON:  Chest radiograph July 09, 2017 FINDINGS: Cardiovascular: There is no demonstrable pulmonary embolus. There is no thoracic aortic aneurysm or dissection. The visualized great vessels appear unremarkable except for minimal calcification at the origin of the left subclavian artery. Note that the left and right common carotid arteries arise as a common trunk, an anatomic variant. There is no appreciable pericardial effusion or pericardial thickening. The main pulmonary outflow tract measures 3.3 cm in diameter, a finding that is suggestive of pulmonary arterial hypertension. Mediastinum/Nodes: Visualized thyroid appears unremarkable. There is no appreciable thoracic adenopathy. No esophageal lesions are evident. Lungs/Pleura: There is focal airspace consolidation in the superior segment of the right lower lobe. There is also airspace consolidation focally in the medial segment of the right lower lobe and to a lesser extent in the lateral segment right middle lobe. There is atelectatic change in the inferior lingula. There is mild lower lobe bronchiectatic change bilaterally. On axial slice 31 series 6, there is a 5 x 5 mm nodular opacity abutting the pleura in the posterior segment of the right upper lobe. On axial slice 58 series 6, there is a 5 x 5 mm nodular opacity in the medial segment of the right middle lobe. No pleural effusion or pleural thickening evident. There is a small foramen of Bochdalek hernia on the left posteriorly. Upper Abdomen: Visualized upper abdominal structures appear unremarkable. Musculoskeletal: There are no appreciable blastic or lytic bone lesions. There is degenerative  change in the thoracic spine. Review of the MIP images confirms the above findings. IMPRESSION: 1. No demonstrable pulmonary embolus. No thoracic aortic aneurysm or dissection. 2. Foci of apparent pneumonia in the right lower lobe and to a lesser extent right middle lobe laterally. 3. 5 mm nodular opacities on the right as noted. No larger pulmonary nodular opacities noted. No follow-up needed if patient is low-risk (and has no known or suspected primary neoplasm). Non-contrast chest CT can be considered in 12 months if patient is high-risk. This  recommendation follows the consensus statement: Guidelines for Management of Incidental Pulmonary Nodules Detected on CT Images: From the Fleischner Society 2017; Radiology 2017; 284:228-243. 4. Prominence of the main pulmonary outflow tract, a finding felt to be indicative of pulmonary arterial hypertension. 5.  No evident thoracic adenopathy. 6.  Mild lower lobe bronchiectatic change bilaterally. 7.  Small foramen of Bochdalek hernia on the left posteriorly. Followup PA and lateral chest radiographs recommended in 3-4 weeks following trial of antibiotic therapy to ensure resolution and exclude underlying malignancy. Electronically Signed   By: Bretta Bang III M.D.   On: 07/09/2017 07:39    Procedures Procedures (including critical care time)  Medications Ordered in ED Medications  potassium chloride SA (K-DUR,KLOR-CON) CR tablet 40 mEq (not administered)  predniSONE (DELTASONE) tablet 60 mg (not administered)  levofloxacin (LEVAQUIN) tablet 500 mg (not administered)  ipratropium-albuterol (DUONEB) 0.5-2.5 (3) MG/3ML nebulizer solution 3 mL (3 mLs Nebulization Given 07/09/17 0556)  iopamidol (ISOVUE-370) 76 % injection 100 mL (100 mLs Intravenous Contrast Given 07/09/17 0720)  potassium chloride 20 MEQ/15ML (10%) solution 40 mEq (40 mEq Oral Given 07/09/17 0742)     Initial Impression / Assessment and Plan / ED Course  I have reviewed the triage vital  signs and the nursing notes.  Pertinent labs & imaging results that were available during my care of the patient were reviewed by me and considered in my medical decision making (see chart for details).  Progressive dyspnea with recent diagnosis of pneumonia.  We will need to repeat chest x-ray.  However, with recent surgery and leg swelling, will also need to check d-dimer to rule out DVT and pulmonary embolism.  Will check BNP to see if there is some component of heart failure that might be causing her dyspnea.  She will be given nebulizer treatment with albuterol and ipratropium.  Old records are reviewed confirming recent ED visit for community-acquired pneumonia, and left knee arthroplasty on December 4.  Repeat chest x-ray is unchanged.  She does feel better after albuterol with ipratropium.  BNP is normal.  D-dimer is elevated, so she is sent for CT angiogram which shows no evidence of pulmonary embolism and confirms 2 foci of pneumonia.  Pulmonary nodule is identified but no follow-up needed because patient is low risk by virtue of being a non-smoker.  Laboratory workup shows hypokalemia.  She is not on any diuretics, so it is presumably related to her albuterol.  This does not actually represent a total body potassium deficit.  She is given a single dose of potassium in the ED and I do not feel she needs ongoing potassium supplementation.  She is given a dose of prednisone and levofloxacin and is discharged with prescriptions for moxifloxacin and prednisone.  Advised recheck in 2 days.  Return precautions discussed.  Advised that if she fails the new antibiotic, she would likely need hospital admission.  Final Clinical Impressions(s) / ED Diagnoses   Final diagnoses:  Community acquired pneumonia of right lung, unspecified part of lung  Hypokalemia    ED Discharge Orders    None       Dione Booze, MD 07/09/17 0800

## 2017-07-09 NOTE — ED Notes (Signed)
Patient transported to CT 

## 2017-07-09 NOTE — Discharge Instructions (Signed)
Stop taking doxycycline and start taking Avelox (first dose tomorrow). Continue using your inhaler as needed. Return if symptoms are getting worse, or if not starting to improve after two days.

## 2017-07-09 NOTE — Telephone Encounter (Signed)
Patient has pneumonia and will to cancel for this week

## 2017-07-11 ENCOUNTER — Ambulatory Visit (HOSPITAL_COMMUNITY): Payer: Medicare HMO

## 2017-07-14 ENCOUNTER — Other Ambulatory Visit: Payer: Self-pay

## 2017-07-14 ENCOUNTER — Encounter (HOSPITAL_COMMUNITY): Payer: Self-pay

## 2017-07-14 ENCOUNTER — Ambulatory Visit (HOSPITAL_COMMUNITY): Payer: Medicare HMO

## 2017-07-14 DIAGNOSIS — M25662 Stiffness of left knee, not elsewhere classified: Secondary | ICD-10-CM | POA: Diagnosis not present

## 2017-07-14 DIAGNOSIS — R2689 Other abnormalities of gait and mobility: Secondary | ICD-10-CM | POA: Diagnosis not present

## 2017-07-14 DIAGNOSIS — M25562 Pain in left knee: Secondary | ICD-10-CM

## 2017-07-14 DIAGNOSIS — M6281 Muscle weakness (generalized): Secondary | ICD-10-CM

## 2017-07-14 NOTE — Therapy (Signed)
[place], [place], [address] Phone: [phone]   Fax:  [phone]  Physical Therapy Treatment/Re-Assessment  Patient Details  Name: Patricia Fuentes MRN: [id number] Date of Birth: [birth year] Referring Provider: Arther Abbott, MD   Encounter Date: 07/14/2017  # OF FEET WALKED: 547 feet during 3 minute walk (0.92 m/s) AROM:  Flexion: 100 degrees            Extension: 0 degrees   PT End of Session - 07/14/17 1519    Visit Number  6    Number of Visits  19    Date for PT Re-Evaluation  07/16/17    Authorization Type  [employer]    Authorization Time Period  06/25/17- 08/08/17    PT Start Time  1521    PT Stop Time  1600    PT Time Calculation (min)  39 min    Activity Tolerance  Patient tolerated treatment well;No increased pain    Behavior During Therapy  WFL for tasks assessed/performed       Past Medical History:  Diagnosis Date  . Bronchitis   . Chronic back pain   . Essential hypertension   . Hyperlipidemia   . IGT (impaired glucose tolerance) 2015  . Obesity, unspecified   . Osteoarthritis   . Seasonal allergies     Past Surgical History:  Procedure Laterality Date  . CARDIAC CATHETERIZATION N/A 10/25/2014   Procedure: Left Heart Cath and Coronary Angiography;  Surgeon: Burnell Blanks, MD;  Location: [facility];  Service: Cardiovascular;  Laterality: N/A;  . COLONOSCOPY N/A 02/09/2016   Procedure: COLONOSCOPY;  Surgeon: Danie Binder, MD;  [facility];  Service: Endoscopy;  Laterality: N/A;  12:30 PM  . KNEE ARTHROSCOPY Left   . TOTAL ABDOMINAL HYSTERECTOMY  1995 approx   Fibroids  . TOTAL KNEE ARTHROPLASTY Right 2011  . TOTAL KNEE ARTHROPLASTY Left 04/29/2017   Procedure: TOTAL KNEE ARTHROPLASTY;  Surgeon: Carole Civil, MD;  Location: [facility];  Service: Orthopedics;  Laterality: Left;    There were no vitals filed for this visit.  Subjective  Assessment - 07/14/17 1616    Subjective  Patient states she is feeling much better since last week when she had toruble breathing from pneumonia. She was abel to keep up with some of her exercises but not all of them as it was difficult to perform them alying down.    Patient Stated Goals  be able to bend leg more and walk easier    Currently in Pain?  No/denies         [facility] PT Assessment - 07/14/17 0001      Assessment   Medical Diagnosis  Left TKA    Referring Provider  Arther Abbott, MD    Onset Date/Surgical Date  04/29/17    Next MD Visit  2/30/19 08/19/17 ?    Prior Therapy  for R TKA      Observation/Other Assessments   Focus on Therapeutic Outcomes (FOTO)   -- 58% lmited at initial eval on 06/25/17, take next session      Circumferential Edema   Circumferential - Left   suprapatellar (60 cm) infrapateller (49 cm)      AROM   Left Knee Extension  0    Left Knee Flexion  100      Strength   Right Hip Flexion  3+/5 was 3+/5    Right Hip Extension  3-/5  was 2-/5    Right Hip ABduction  3/5 was 3-/5    Left Hip Flexion  3+/5 was 3+/5    Left Hip Extension  3-/5 was 2-/5    Left Hip ABduction  3+/5 was 3-/5    Right Knee Flexion  5/5 was 4+/5    Right Knee Extension  5/5 was 4+/5    Left Knee Flexion  4+/5 was 4/5    Left Knee Extension  4+/5 was 4/5    Right Ankle Dorsiflexion  4+/5 was 4+/5    Left Ankle Dorsiflexion  4+/5 was 4+/5      Transfers   Five time sit to stand comments   13.5, no UE use was 22 seconds      Ambulation/Gait   Ambulation/Gait  Yes    Ambulation/Gait Assistance  6: Modified independent (Device/Increase time)    Ambulation Distance (Feet)  547 Feet    Assistive device  Straight cane    Gait velocity  0.92 m/s was 0.52 m/s      Timed Up and Go Test   Normal TUG (seconds)  10.1 SPC; was 14.38       [facility] Adult PT Treatment/Exercise - 07/14/17 0001      Knee/Hip Exercises: Standing   Rocker Board  2 minutes lateral      Knee/Hip  Exercises: Supine   Heel Slides  15 reps;Left    Bridges  Both;15 reps    Knee Extension Limitations  0    Knee Flexion Limitations  100      Manual Therapy   Manual Therapy  Edema management;Joint mobilization;Myofascial release    Manual therapy comments  Manual complete separate of rest of tx    Edema Management  Retro massage with LE elevated        PT Education - 07/14/17 1606    Education provided  Yes    Education Details  educated on progress towards goals and on remaining limitations, educated on form/technqiue with exercises throughout    [employer]) Educated  Patient    Methods  Explanation    Comprehension  Verbalized understanding       PT Short Term Goals - 07/14/17 1520      PT SHORT TERM GOAL #1   Title  Patient will be independent with HEP to demonstrate improved independence with exercise and advance functional strengthening.    Time  2    Period  Weeks    Status  Achieved      PT SHORT TERM GOAL #2   Title  Patient will have improve AROM into flexion for left knee by 8 degrees to demonstrate significant change and improvement in joint mobility for improved functional mobility wiht gait and stairs.    Baseline  07/14/17 - 0*-100*    Time  3    Period  Weeks    Status  Achieved      PT SHORT TERM GOAL #3   Title  Patient will demonstrate improved MMT for limited muscle groups by 1/2 grade to improve functional strength for improve gait quality and greater muscular endurance with functional activity.    Baseline  07/14/17 - all improvedb y 1/2 grade some by full grade    Time  3    Period  Weeks    Status  Achieved      PT SHORT TERM GOAL #4   Title  Patient will perform TUG wiht LRAD in less than or equal to 12 seconds to reduce  fall risk during dynamic gait activitites.    Baseline  07/14/17 - 10.1 seconds with SPC    Time  3    Period  Weeks    Status  Achieved        PT Long Term Goals - 07/14/17 1520      PT LONG TERM GOAL #1   Title  Patient  will have improve AROM into flexion for left knee to 110 degrees to have improvement in joint mobility for improved functional mobility wiht gait and stairs.    Baseline  07/14/17 - 0*-100*    Time  6    Period  Weeks    Status  On-going      PT LONG TERM GOAL #2   Title  Patient will demonstrate improved MMT for limited muscle groups by 1 grade to improve functional strength for improve gait quality and greater muscular endurance with functional activity.    Baseline  07/14/17 - all improved by 1/2 grade some by full grade    Time  6    Period  Weeks    Status  On-going      PT LONG TERM GOAL #3   Title  Patient will improve 5x sit to stand testing in less than 15 seconds to demosntrate significant improvement in bil LE strength and decrease risk of falling.    Baseline  07/14/17 - 13.5 seconds    Time  6    Period  Weeks    Status  Achieved      PT LONG TERM GOAL #4   Title  Patient will have decreased circumferential edema measurement to be no more than 1 cm greater above and below the patella from right to left knee.    Baseline  07/14/17 - no change    Time  6    Period  Weeks    Status  On-going      PT LONG TERM GOAL #5   Title  Patient will ambulate wtih LRAD at safe community velocity of 1.2 m/s to demosntrate improved/safe functional gait speed to reduce fall risk and improve community mobility.    Baseline  07/14/17 - 0.92 m/s with SPC    Time  3    Period  Weeks    Status  New    Target Date  08/06/17        Plan - 07/14/17 1520    Clinical Impression Statement  Re-assessment performed today and patient has met 4/4 short term goals and 2/4 long term goals. She has demonstrated significant improvement in left knee AROM and currently has 0*-100*. Her gait velocity has improved significantly from 0.52 m/s to 0.92 m/s indicating decrease fall risk. She continues to be limited by left LE edema and has been educated on importance of wearing compression garments and  elevation/ice to reduce swelling. She will continue to benefit from skilled PT services to address current impairments and progress towards goals.    Rehab Potential  Fair    Clinical Impairments Affecting Rehab Potential  (+) motivation, (+) positive experience with PT for right TKA, (-) edema    PT Frequency  3x / week    PT Duration  6 weeks    PT Treatment/Interventions  ADLs/Self Care Home Management;Cryotherapy;Electrical Stimulation;DME Instruction;Gait training;Stair training;Functional mobility training;Therapeutic activities;Therapeutic exercise;Balance training;Neuromuscular re-education;Patient/family education;Manual techniques;Scar mobilization;Passive range of motion;Energy conservation;Taping    PT Next Visit Plan  Continue with bike and continue focus with knee mobiltiy.  Include manual therapy for  MFR scar tissue restrictions, joint mobs and edema control.  Advance step up training and gait training with/without AD. Review donn/doffing with compression hose as needed    PT Home Exercise Plan  Eval: heel raises, heel slides with flexion stretch; 07/02/2017: SLR, bridge and standing TKE.    Consulted and Agree with Plan of Care  Patient       Patient will benefit from skilled therapeutic intervention in order to improve the following deficits and impairments:  Decreased balance, Abnormal gait, Decreased endurance, Decreased mobility, Difficulty walking, Hypomobility, Decreased range of motion, Decreased scar mobility, Increased edema, Improper body mechanics, Decreased activity tolerance, Decreased knowledge of use of DME, Decreased strength, Increased fascial restricitons, Impaired flexibility, Postural dysfunction  Visit Diagnosis: Left knee pain, unspecified chronicity  Stiffness of left knee, not elsewhere classified  Other abnormalities of gait and mobility  Muscle weakness (generalized)     Problem List Patient Active Problem List   Diagnosis Date Noted  . S/P total  knee arthroplasty, left 04/29/17 05/06/2017  . Anemia 05/06/2017  . Primary osteoarthritis of left knee 04/29/2017  . Primary osteoarthritis of both knees   . Abnormal CT scan, colon 01/18/2016  . Dyspnea   . Abnormal myocardial perfusion study   . Fatigue 07/18/2014  . Vitamin D deficiency 07/18/2014  . Exercise intolerance 07/18/2014  . Sleep disorder 07/18/2014  . Nonspecific abnormal electrocardiogram (ECG) (EKG) 07/18/2014  . Reduced vision 12/27/2013  . Radicular pain of thoracic region 06/22/2013  . Prediabetes 10/20/2012  . Metabolic syndrome X 61/48/3073  . BACK PAIN WITH RADICULOPATHY 09/20/2008  . Hyperlipidemia LDL goal <100 03/29/2008  . Morbid obesity ([place]) 03/29/2008  . Essential hypertension 10/07/2007    Kipp Brood, PT, DPT Physical Therapist with [facility]  07/14/2017 4:21 PM    [place], [place], [address] Phone: [phone]   Fax:  [phone]  Name: Patricia Fuentes MRN: [id number] Date of Birth: 11/11/1945

## 2017-07-16 ENCOUNTER — Ambulatory Visit (HOSPITAL_COMMUNITY): Payer: Medicare HMO

## 2017-07-16 DIAGNOSIS — M6281 Muscle weakness (generalized): Secondary | ICD-10-CM | POA: Diagnosis not present

## 2017-07-16 DIAGNOSIS — R2689 Other abnormalities of gait and mobility: Secondary | ICD-10-CM | POA: Diagnosis not present

## 2017-07-16 DIAGNOSIS — M25662 Stiffness of left knee, not elsewhere classified: Secondary | ICD-10-CM | POA: Diagnosis not present

## 2017-07-16 DIAGNOSIS — M25562 Pain in left knee: Secondary | ICD-10-CM

## 2017-07-16 NOTE — Therapy (Signed)
[place] [facility] [address] [place], [place], [address] Phone: [phone]   Fax:  [phone]  Physical Therapy Treatment  Patient Details  Name: Patricia Fuentes MRN: [id number] Date of Birth: 1946/04/17 Referring Provider: Fuller Canada, MD   Encounter Date: 07/16/2017  PT End of Session - 07/16/17 1525    Visit Number  7    Number of Visits  19    Date for PT Re-Evaluation  08/08/17    Authorization Type  [employer]    Authorization Time Period  06/25/17- 08/08/17    PT Start Time  1516    PT Stop Time  1600    PT Time Calculation (min)  44 min    Activity Tolerance  Patient tolerated treatment well;No increased pain    Behavior During Therapy  WFL for tasks assessed/performed       Past Medical History:  Diagnosis Date  . Bronchitis   . Chronic back pain   . Essential hypertension   . Hyperlipidemia   . IGT (impaired glucose tolerance) 2015  . Obesity, unspecified   . Osteoarthritis   . Seasonal allergies     Past Surgical History:  Procedure Laterality Date  . CARDIAC CATHETERIZATION N/A 10/25/2014   Procedure: Left Heart Cath and Coronary Angiography;  Surgeon: Kathleene Hazel, MD;  Location: [facility];  Service: Cardiovascular;  Laterality: N/A;  . COLONOSCOPY N/A 02/09/2016   Procedure: COLONOSCOPY;  Surgeon: West Bali, MD;  [facility];  Service: Endoscopy;  Laterality: N/A;  12:30 PM  . KNEE ARTHROSCOPY Left   . TOTAL ABDOMINAL HYSTERECTOMY  1995 approx   Fibroids  . TOTAL KNEE ARTHROPLASTY Right 2011  . TOTAL KNEE ARTHROPLASTY Left 04/29/2017   Procedure: TOTAL KNEE ARTHROPLASTY;  Surgeon: Vickki Hearing, MD;  Location: [facility];  Service: Orthopedics;  Laterality: Left;    There were no vitals filed for this visit.  Subjective Assessment - 07/16/17 1520    Subjective  Pt doing well today, some pain in her Lef tknee worse today related to the cold weather. She say sher  HEP is gong well.     Pertinent History  R TKA 2011, L TKA 2018    Currently in Pain?  Yes    Pain Score  3     Pain Location  Knee    Pain Orientation  Left    Pain Type  Surgical pain                      [facility] Adult PT Treatment/Exercise - 07/16/17 0001      Knee/Hip Exercises: Stretches   Passive Hamstring Stretch  Left;3 reps;30 seconds    Passive Hamstring Stretch Limitations  standing, 8" step    Quad Stretch  3 reps;30 seconds    Quad Stretch Limitations  prone, PT assisted, to 98 degrees ROM equal to measures seated, no rectus limitation.     Knee: Self-Stretch to increase Flexion  3 reps;30 seconds    Gastroc Stretch  Both;30 seconds;2 reps    Gastroc Stretch Limitations  slant board      Knee/Hip Exercises: Aerobic   Nustep  x 4 minutes, level 1, AA/ROM for left knee in place of heel sliides      Knee/Hip Exercises: Standing   Heel Raises  2 sets;15 reps    Knee Flexion  15 reps;2 sets;Left;AROM abrupt firm end-feel, range limited.     Lateral  Step Up  2 sets;Hand Hold: 0;Step Height: 2" heavy VC required    Forward Step Up  Left;Hand Hold: 0;2 sets;10 reps;Step Height: 2"    Step Down  Hand Hold: 0;Step Height: 2";2 sets;Left    Other Standing Knee Exercises  downsloping bilat ankel dorsiflexion: 2x10  significan tanterio compartment weakness, likely limiting TK      Knee/Hip Exercises: Seated   Long Arc Quad  Left;1 set;10 reps;Weights    Long Arc Quad Weight  3 lbs. for improved proprioception and capsular stretch @rest :3x60s    Sit to Sand  10 reps;without UE support 2" step under RLE for improved weight shift/WB thru LLE      Knee/Hip Exercises: Supine   Bridges  Both;15 reps               PT Short Term Goals - 07/14/17 1520      PT SHORT TERM GOAL #1   Title  Patient will be independent with HEP to demonstrate improved independence with exercise and advance functional strengthening.    Time  2    Period  Weeks    Status  Achieved       PT SHORT TERM GOAL #2   Title  Patient will have improve AROM into flexion for left knee by 8 degrees to demonstrate significant change and improvement in joint mobility for improved functional mobility wiht gait and stairs.    Baseline  07/14/17 - 0*-100*    Time  3    Period  Weeks    Status  Achieved      PT SHORT TERM GOAL #3   Title  Patient will demonstrate improved MMT for limited muscle groups by 1/2 grade to improve functional strength for improve gait quality and greater muscular endurance with functional activity.    Baseline  07/14/17 - all improvedb y 1/2 grade some by full grade    Time  3    Period  Weeks    Status  Achieved      PT SHORT TERM GOAL #4   Title  Patient will perform TUG wiht LRAD in less than or equal to 12 seconds to reduce fall risk during dynamic gait activitites.    Baseline  07/14/17 - 10.1 seconds with SPC    Time  3    Period  Weeks    Status  Achieved        PT Long Term Goals - 07/14/17 1520      PT LONG TERM GOAL #1   Title  Patient will have improve AROM into flexion for left knee to 110 degrees to have improvement in joint mobility for improved functional mobility wiht gait and stairs.    Baseline  07/14/17 - 0*-100*    Time  6    Period  Weeks    Status  On-going      PT LONG TERM GOAL #2   Title  Patient will demonstrate improved MMT for limited muscle groups by 1 grade to improve functional strength for improve gait quality and greater muscular endurance with functional activity.    Baseline  07/14/17 - all improved by 1/2 grade some by full grade    Time  6    Period  Weeks    Status  On-going      PT LONG TERM GOAL #3   Title  Patient will improve 5x sit to stand testing in less than 15 seconds to demosntrate significant improvement in bil LE strength and  decrease risk of falling.    Baseline  07/14/17 - 13.5 seconds    Time  6    Period  Weeks    Status  Achieved      PT LONG TERM GOAL #4   Title  Patient will have  decreased circumferential edema measurement to be no more than 1 cm greater above and below the patella from right to left knee.    Baseline  07/14/17 - no change    Time  6    Period  Weeks    Status  On-going      PT LONG TERM GOAL #5   Title  Patient will ambulate wtih LRAD at safe community velocity of 1.2 m/s to demosntrate improved/safe functional gait speed to reduce fall risk and improve community mobility.    Baseline  07/14/17 - 0.92 m/s with SPC    Time  3    Period  Weeks    Status  New    Target Date  08/06/17            Plan - 07/16/17 1526    Clinical Impression Statement  Continued current focus on gentle progression of joitn mobiilty, improved activation of knee muscles, and progression fo fucntional mobility. Pt tolerating well, continues to demonstrate improved activity tolerance and moderate weakness at the knee hip and ankle. Quads strength for TKE ROM looks excellent, however flexion rom remains consistent at 98 degree both prone adn seated, consisten twith joint effusion capsular limitation. Compression garment donned this session, pt reports Modified independence donning/doffing. Lateral step-ups added this session with moderate difficulty perfoming with gait stability.     Rehab Potential  Fair    Clinical Impairments Affecting Rehab Potential  (+) motivation, (+) positive experience with PT for right TKA, (-) edema    PT Frequency  3x / week    PT Duration  6 weeks    PT Treatment/Interventions  ADLs/Self Care Home Management;Cryotherapy;Electrical Stimulation;DME Instruction;Gait training;Stair training;Functional mobility training;Therapeutic activities;Therapeutic exercise;Balance training;Neuromuscular re-education;Patient/family education;Manual techniques;Scar mobilization;Passive range of motion;Energy conservation;Taping    PT Next Visit Plan  Continue with bike and continue focus with knee mobiltiy.  Include manual therapy for MFR scar tissue restrictions,  joint mobs and edema control.  Advance step up training and gait training with/without AD. Review don/doffing with compression hose as needed    PT Home Exercise Plan  Eval: heel raises, heel slides with flexion stretch; 07/02/2017: SLR, bridge and standing TKE.    Consulted and Agree with Plan of Care  Patient       Patient will benefit from skilled therapeutic intervention in order to improve the following deficits and impairments:  Decreased balance, Abnormal gait, Decreased endurance, Decreased mobility, Difficulty walking, Hypomobility, Decreased range of motion, Decreased scar mobility, Increased edema, Improper body mechanics, Decreased activity tolerance, Decreased knowledge of use of DME, Decreased strength, Increased fascial restricitons, Impaired flexibility, Postural dysfunction  Visit Diagnosis: Left knee pain, unspecified chronicity  Stiffness of left knee, not elsewhere classified  Other abnormalities of gait and mobility  Muscle weakness (generalized)     Problem List Patient Active Problem List   Diagnosis Date Noted  . S/P total knee arthroplasty, left 04/29/17 05/06/2017  . Anemia 05/06/2017  . Primary osteoarthritis of left knee 04/29/2017  . Primary osteoarthritis of both knees   . Abnormal CT scan, colon 01/18/2016  . Dyspnea   . Abnormal myocardial perfusion study   . Fatigue 07/18/2014  . Vitamin D deficiency 07/18/2014  .  Exercise intolerance 07/18/2014  . Sleep disorder 07/18/2014  . Nonspecific abnormal electrocardiogram (ECG) (EKG) 07/18/2014  . Reduced vision 12/27/2013  . Radicular pain of thoracic region 06/22/2013  . Prediabetes 10/20/2012  . Metabolic syndrome X 10/20/2012  . BACK PAIN WITH RADICULOPATHY 09/20/2008  . Hyperlipidemia LDL goal <100 03/29/2008  . Morbid obesity ([facility]) 03/29/2008  . Essential hypertension 10/07/2007   3:55 PM, 07/16/17 Rosamaria LintsAllan C Buccola, PT, DPT Physical Therapist - [place] [phone] (Office)       Rosamaria LintsBuccola,Allan C 07/16/2017, 3:55 PM  [facility] [facility] [address] [place], [place], [address] Phone: [phone]   Fax:  [phone]  Name: Patricia Fuentes MRN: [id number] Date of Birth: Jul 01, 1945

## 2017-07-18 ENCOUNTER — Encounter (HOSPITAL_COMMUNITY): Payer: Self-pay

## 2017-07-21 ENCOUNTER — Ambulatory Visit: Payer: Self-pay

## 2017-07-21 ENCOUNTER — Encounter (HOSPITAL_COMMUNITY): Payer: Self-pay

## 2017-07-21 ENCOUNTER — Ambulatory Visit (HOSPITAL_COMMUNITY): Payer: Medicare HMO

## 2017-07-21 DIAGNOSIS — M25662 Stiffness of left knee, not elsewhere classified: Secondary | ICD-10-CM | POA: Diagnosis not present

## 2017-07-21 DIAGNOSIS — M25562 Pain in left knee: Secondary | ICD-10-CM | POA: Diagnosis not present

## 2017-07-21 DIAGNOSIS — M6281 Muscle weakness (generalized): Secondary | ICD-10-CM | POA: Diagnosis not present

## 2017-07-21 DIAGNOSIS — R2689 Other abnormalities of gait and mobility: Secondary | ICD-10-CM

## 2017-07-21 NOTE — Therapy (Signed)
[facility] [address] [place], [place], [address] Phone: [phone]   Fax:  [phone]  Physical Therapy Treatment  Patient Details  Name: Patricia Fuentes MRN: [id number] Date of Birth: [birth year] Referring Provider: Fuller Canada, MD   Encounter Date: 07/21/2017  PT End of Session - 07/21/17 1607    Visit Number  8    Number of Visits  19    Date for PT Re-Evaluation  08/08/17    Authorization Type  [employer]    Authorization Time Period  06/25/17- 08/08/17    PT Start Time  1515    PT Stop Time  1603    PT Time Calculation (min)  48 min    Activity Tolerance  Patient tolerated treatment well;No increased pain    Behavior During Therapy  WFL for tasks assessed/performed       Past Medical History:  Diagnosis Date  . Bronchitis   . Chronic back pain   . Essential hypertension   . Hyperlipidemia   . IGT (impaired glucose tolerance) 2015  . Obesity, unspecified   . Osteoarthritis   . Seasonal allergies     Past Surgical History:  Procedure Laterality Date  . CARDIAC CATHETERIZATION N/A 10/25/2014   Procedure: Left Heart Cath and Coronary Angiography;  Surgeon: Kathleene Hazel, MD;  Location: [facility] INVASIVE CV LAB;  Service: Cardiovascular;  Laterality: N/A;  . COLONOSCOPY N/A 02/09/2016   Procedure: COLONOSCOPY;  Surgeon: West Bali, MD;  Location: [facility] ENDO SUITE;  Service: Endoscopy;  Laterality: N/A;  12:30 PM  . KNEE ARTHROSCOPY Left   . TOTAL ABDOMINAL HYSTERECTOMY  1995 approx   Fibroids  . TOTAL KNEE ARTHROPLASTY Right 2011  . TOTAL KNEE ARTHROPLASTY Left 04/29/2017   Procedure: TOTAL KNEE ARTHROPLASTY;  Surgeon: Vickki Hearing, MD;  Location: [facility] ORS;  Service: Orthopedics;  Laterality: Left;    There were no vitals filed for this visit.  Subjective Assessment - 07/21/17 1613    Subjective  Pt reports she had a busy weekend with her son in town. She is not in any knee pain currently.     Pertinent History  R TKA 2011, L TKA 2018    Currently in Pain?  No/denies         [facility] PT Assessment - 07/21/17 0001      Circumferential Edema   Circumferential - Left   suprapatellar (60 cm) infrapateller (49 cm)           [facility] Adult PT Treatment/Exercise - 07/21/17 0001      Knee/Hip Exercises: Stretches   Passive Hamstring Stretch  Left;3 reps;30 seconds    Passive Hamstring Stretch Limitations  standing, 12" step    Knee: Self-Stretch to increase Flexion  3 reps;30 seconds    Knee: Self-Stretch Limitations  12" step    Gastroc Stretch  Both;3 reps;30 seconds    Gastroc Stretch Limitations  slant board      Knee/Hip Exercises: Aerobic   Stationary Bike  x4 mins, seat 13, retro and fwd revolutions for ROM (x2 mins each)      Knee/Hip Exercises: Standing   Heel Raises  Both    Heel Raises Limitations  30 reps, heel and toe    Knee Flexion  15 reps;2 sets;Left    Lateral Step Up  Left;15 reps;Hand Hold: 1;Step Height: 4" decreased verbal cuess for proper technique    Forward Step Up  Left;15 reps;Hand Hold: 1;Step Height: 4"  Step Down  Left;15 reps;Hand Hold: 0;Step Height: 4" cues for technique    Gait Training  x2 laps focusing on heel strike and knee flexion during gait with SPC    Other Standing Knee Exercises  sidestep 31ft x3RT with RTB       Knee/Hip Exercises: Seated   Long Arc Quad  Left;15 reps    Long Arc Quad Weight  3 lbs.      Knee/Hip Exercises: Supine   Short Arc Quad Sets  Left;15 reps;Limitations    Short Arc Quad Sets Limitations  3#, basketball    Knee Extension Limitations  0    Knee Flexion Limitations  105             PT Short Term Goals - 07/14/17 1520      PT SHORT TERM GOAL #1   Title  Patient will be independent with HEP to demonstrate improved independence with exercise and advance functional strengthening.    Time  2    Period  Weeks    Status  Achieved      PT SHORT TERM GOAL #2   Title  Patient will have improve  AROM into flexion for left knee by 8 degrees to demonstrate significant change and improvement in joint mobility for improved functional mobility wiht gait and stairs.    Baseline  07/14/17 - 0*-100*    Time  3    Period  Weeks    Status  Achieved      PT SHORT TERM GOAL #3   Title  Patient will demonstrate improved MMT for limited muscle groups by 1/2 grade to improve functional strength for improve gait quality and greater muscular endurance with functional activity.    Baseline  07/14/17 - all improvedb y 1/2 grade some by full grade    Time  3    Period  Weeks    Status  Achieved      PT SHORT TERM GOAL #4   Title  Patient will perform TUG wiht LRAD in less than or equal to 12 seconds to reduce fall risk during dynamic gait activitites.    Baseline  07/14/17 - 10.1 seconds with SPC    Time  3    Period  Weeks    Status  Achieved        PT Long Term Goals - 07/14/17 1520      PT LONG TERM GOAL #1   Title  Patient will have improve AROM into flexion for left knee to 110 degrees to have improvement in joint mobility for improved functional mobility wiht gait and stairs.    Baseline  07/14/17 - 0*-100*    Time  6    Period  Weeks    Status  On-going      PT LONG TERM GOAL #2   Title  Patient will demonstrate improved MMT for limited muscle groups by 1 grade to improve functional strength for improve gait quality and greater muscular endurance with functional activity.    Baseline  07/14/17 - all improved by 1/2 grade some by full grade    Time  6    Period  Weeks    Status  On-going      PT LONG TERM GOAL #3   Title  Patient will improve 5x sit to stand testing in less than 15 seconds to demosntrate significant improvement in bil LE strength and decrease risk of falling.    Baseline  07/14/17 - 13.5 seconds  Time  6    Period  Weeks    Status  Achieved      PT LONG TERM GOAL #4   Title  Patient will have decreased circumferential edema measurement to be no more than 1 cm  greater above and below the patella from right to left knee.    Baseline  07/14/17 - no change    Time  6    Period  Weeks    Status  On-going      PT LONG TERM GOAL #5   Title  Patient will ambulate wtih LRAD at safe community velocity of 1.2 m/s to demosntrate improved/safe functional gait speed to reduce fall risk and improve community mobility.    Baseline  07/14/17 - 0.92 m/s with SPC    Time  3    Period  Weeks    Status  New    Target Date  08/06/17            Plan - 07/21/17 1607    Clinical Impression Statement  Continued with established POC focusing knee flexion ROM and overall strength of L knee and hips. Pt demo'd fatigue with strengthening exercises AEB need for 2-3 rest breaks during session. Pt demo'ing improved step ups this date and able to perform on 4" step with min cueing. Added sidestepping to address hip weakness/trendelenberg noted during gait. Ended with manual for edema management and soft tissue restrictions of distal VMO. AROM 0-105deg this date. Continue as planned, progressing as able.    Rehab Potential  Fair    Clinical Impairments Affecting Rehab Potential  (+) motivation, (+) positive experience with PT for right TKA, (-) edema    PT Frequency  3x / week    PT Duration  6 weeks    PT Treatment/Interventions  ADLs/Self Care Home Management;Cryotherapy;Electrical Stimulation;DME Instruction;Gait training;Stair training;Functional mobility training;Therapeutic activities;Therapeutic exercise;Balance training;Neuromuscular re-education;Patient/family education;Manual techniques;Scar mobilization;Passive range of motion;Energy conservation;Taping    PT Next Visit Plan  Continue with bike and continue focus with knee mobiltiy.  Include manual therapy for MFR scar tissue restrictions, joint mobs and edema control.  Advance step up training and gait training with/without AD. add hip hikes, add contract relax for improved knee flexion    PT Home Exercise Plan   Eval: heel raises, heel slides with flexion stretch; 07/02/2017: SLR, bridge and standing TKE.    Consulted and Agree with Plan of Care  Patient       Patient will benefit from skilled therapeutic intervention in order to improve the following deficits and impairments:  Decreased balance, Abnormal gait, Decreased endurance, Decreased mobility, Difficulty walking, Hypomobility, Decreased range of motion, Decreased scar mobility, Increased edema, Improper body mechanics, Decreased activity tolerance, Decreased knowledge of use of DME, Decreased strength, Increased fascial restricitons, Impaired flexibility, Postural dysfunction  Visit Diagnosis: Left knee pain, unspecified chronicity  Stiffness of left knee, not elsewhere classified  Other abnormalities of gait and mobility  Muscle weakness (generalized)     Problem List Patient Active Problem List   Diagnosis Date Noted  . S/P total knee arthroplasty, left 04/29/17 05/06/2017  . Anemia 05/06/2017  . Primary osteoarthritis of left knee 04/29/2017  . Primary osteoarthritis of both knees   . Abnormal CT scan, colon 01/18/2016  . Dyspnea   . Abnormal myocardial perfusion study   . Fatigue 07/18/2014  . Vitamin D deficiency 07/18/2014  . Exercise intolerance 07/18/2014  . Sleep disorder 07/18/2014  . Nonspecific abnormal electrocardiogram (ECG) (EKG) 07/18/2014  .  Reduced vision 12/27/2013  . Radicular pain of thoracic region 06/22/2013  . Prediabetes 10/20/2012  . Metabolic syndrome X 10/20/2012  . BACK PAIN WITH RADICULOPATHY 09/20/2008  . Hyperlipidemia LDL goal <100 03/29/2008  . Morbid obesity (HCC) 03/29/2008  . Essential hypertension 10/07/2007       Jac CanavanBrooke Powell PT, DPT  [facility] [address] [place], [place], [address] Phone: [phone]   Fax:  [phone]  Name: Patricia Fuentes MRN: [id number] Date of Birth: 11/08/1945

## 2017-07-22 ENCOUNTER — Encounter: Payer: Self-pay | Admitting: Family Medicine

## 2017-07-22 ENCOUNTER — Ambulatory Visit (INDEPENDENT_AMBULATORY_CARE_PROVIDER_SITE_OTHER): Payer: Medicare HMO | Admitting: Family Medicine

## 2017-07-22 ENCOUNTER — Ambulatory Visit: Payer: Self-pay

## 2017-07-22 VITALS — BP 140/80 | HR 75 | Resp 16 | Ht 62.0 in | Wt 246.0 lb

## 2017-07-22 DIAGNOSIS — Z1231 Encounter for screening mammogram for malignant neoplasm of breast: Secondary | ICD-10-CM

## 2017-07-22 DIAGNOSIS — I10 Essential (primary) hypertension: Secondary | ICD-10-CM | POA: Diagnosis not present

## 2017-07-22 DIAGNOSIS — J189 Pneumonia, unspecified organism: Secondary | ICD-10-CM

## 2017-07-22 DIAGNOSIS — Z09 Encounter for follow-up examination after completed treatment for conditions other than malignant neoplasm: Secondary | ICD-10-CM

## 2017-07-22 DIAGNOSIS — R350 Frequency of micturition: Secondary | ICD-10-CM | POA: Diagnosis not present

## 2017-07-22 NOTE — Patient Instructions (Addendum)
F/u with MD in end August , call if you need me before  Please submit urine for testing asap   Please get CXR March 14 , already ordered to follow up pneumoinia  We will schedule your mammogram this is due  Need fasting chem 7  asap  The same day when you get urine  For right shoulder pain  For past 3 days , use alleve and tylenol

## 2017-07-23 ENCOUNTER — Encounter: Payer: Self-pay | Admitting: Orthopedic Surgery

## 2017-07-23 ENCOUNTER — Ambulatory Visit (INDEPENDENT_AMBULATORY_CARE_PROVIDER_SITE_OTHER): Payer: Medicare HMO | Admitting: Orthopedic Surgery

## 2017-07-23 ENCOUNTER — Ambulatory Visit (HOSPITAL_COMMUNITY): Payer: Medicare HMO

## 2017-07-23 ENCOUNTER — Encounter (HOSPITAL_COMMUNITY): Payer: Self-pay

## 2017-07-23 VITALS — BP 118/63 | HR 84 | Ht 62.0 in | Wt 248.0 lb

## 2017-07-23 DIAGNOSIS — M25662 Stiffness of left knee, not elsewhere classified: Secondary | ICD-10-CM

## 2017-07-23 DIAGNOSIS — Z96652 Presence of left artificial knee joint: Secondary | ICD-10-CM

## 2017-07-23 DIAGNOSIS — M6281 Muscle weakness (generalized): Secondary | ICD-10-CM

## 2017-07-23 DIAGNOSIS — M25562 Pain in left knee: Secondary | ICD-10-CM | POA: Diagnosis not present

## 2017-07-23 DIAGNOSIS — I10 Essential (primary) hypertension: Secondary | ICD-10-CM | POA: Diagnosis not present

## 2017-07-23 DIAGNOSIS — M7551 Bursitis of right shoulder: Secondary | ICD-10-CM | POA: Diagnosis not present

## 2017-07-23 DIAGNOSIS — R2689 Other abnormalities of gait and mobility: Secondary | ICD-10-CM | POA: Diagnosis not present

## 2017-07-23 LAB — POCT URINALYSIS DIPSTICK
Appearance: NORMAL
GLUCOSE UA: NEGATIVE
LEUKOCYTES UA: NEGATIVE
Nitrite, UA: NEGATIVE
Odor: NORMAL
Spec Grav, UA: 1.02 (ref 1.010–1.025)
Urobilinogen, UA: 0.2 E.U./dL
pH, UA: 6.5 (ref 5.0–8.0)

## 2017-07-23 LAB — BASIC METABOLIC PANEL
BUN: 11 mg/dL (ref 7–25)
CALCIUM: 9.2 mg/dL (ref 8.6–10.4)
CO2: 32 mmol/L (ref 20–32)
Chloride: 101 mmol/L (ref 98–110)
Creat: 0.72 mg/dL (ref 0.50–0.99)
Glucose, Bld: 97 mg/dL (ref 65–139)
POTASSIUM: 4 mmol/L (ref 3.5–5.3)
SODIUM: 141 mmol/L (ref 135–146)

## 2017-07-23 NOTE — Progress Notes (Signed)
POST OP VISIT   Patient ID: Patricia Fuentes, female   DOB: Mar 19, 1946, 72 y.o.   MRN: [id number]  Chief Complaint  Patient presents with  . Follow-up    Recheck on left TKA, DOS 04-29-17.    Encounter Diagnosis  Name Primary?  . S/P total knee arthroplasty, left 04/29/17 Yes   Postop day 85 left total knee patient doing well she is work.  She has a new problem today she complains of pain in the right shoulder times 1 week and painful forward elevation of the right arm with weakness and doing so no history of trauma no catching locking or giving way   review of systems no swelling erythema noted no numbness or tingling   Exam shows positive impingement sign at 110 degrees negative drop test normal rotation with the arm at her side  Impression bursitis  Recommend subacromial injection  We will give her a return to work note and she can come back for routine follow-up regarding her knee in 3 months   Procedure note the subacromial injection shoulder RIGHT  Verbal consent was obtained to inject the  RIGHT   Shoulder  Timeout was completed to confirm the injection site is a subacromial space of the  RIGHT  shoulder   Medication used Depo-Medrol 40 mg and lidocaine 1% 3 cc  Anesthesia was provided by ethyl chloride  The injection was performed in the RIGHT  posterior subacromial space. After pinning the skin with alcohol and anesthetized the skin with ethyl chloride the subacromial space was injected using a 20-gauge needle. There were no complications  Sterile dressing was applied.

## 2017-07-23 NOTE — Therapy (Signed)
[facility] [address] [place], [place], [address] Phone: [phone]   Fax:  [phone]  Physical Therapy Treatment  Patient Details  Name: Patricia Fuentes MRN: [id number] Date of Birth: [birth year] Referring Provider: Fuller CanadaStanley Harrison  AROM 0-106 Gait: unlimited through session, gait training with SPC 226 to improve mechanics initially this session   Encounter Date: 07/23/2017  PT End of Session - 07/23/17 1440    Visit Number  9    Number of Visits  19    Date for PT Re-Evaluation  08/08/17    Authorization Type  [employer]    Authorization Time Period  06/25/17- 08/08/17    PT Start Time  1435 3' on bike for mobility, no charge    PT Stop Time  1511 Left early for Md apt    PT Time Calculation (min)  36 min    Activity Tolerance  Patient tolerated treatment well;No increased pain    Behavior During Therapy  WFL for tasks assessed/performed       Past Medical History:  Diagnosis Date  . Bronchitis   . Chronic back pain   . Essential hypertension   . Hyperlipidemia   . IGT (impaired glucose tolerance) 2015  . Obesity, unspecified   . Osteoarthritis   . Seasonal allergies     Past Surgical History:  Procedure Laterality Date  . CARDIAC CATHETERIZATION N/A 10/25/2014   Procedure: Left Heart Cath and Coronary Angiography;  Surgeon: Kathleene Hazelhristopher D McAlhany, MD;  [facility];  Service: Cardiovascular;  Laterality: N/A;  . COLONOSCOPY N/A 02/09/2016   Procedure: COLONOSCOPY;  Surgeon: West BaliSandi L Fields, MD;  Location: [facility] ENDO SUITE;  Service: Endoscopy;  Laterality: N/A;  12:30 PM  . KNEE ARTHROSCOPY Left   . TOTAL ABDOMINAL HYSTERECTOMY  1995 approx   Fibroids  . TOTAL KNEE ARTHROPLASTY Right 2011  . TOTAL KNEE ARTHROPLASTY Left 04/29/2017   Procedure: TOTAL KNEE ARTHROPLASTY;  Surgeon: Vickki HearingHarrison, Stanley E, MD;  Location: [facility] ORS;  Service: Orthopedics;  Laterality: Left;    There were no vitals filed for  this visit.  Subjective Assessment - 07/23/17 1439    Subjective  Pt stated her knee is feeling good today, no reports of pain today.  Goes to see knee MD later today    Patient Stated Goals  be able to bend leg more and walk easier    Currently in Pain?  No/denies         [facility] - 07/23/17 0001      Assessment   Medical Diagnosis  Left TKA    Referring Provider  Fuller CanadaStanley Harrison    Onset Date/Surgical Date  04/29/17    Next MD Visit  07/23/17    Prior Therapy  for R TKA      AROM   Left Knee Extension  0    Left Knee Flexion  106                  [facility] Adult PT Treatment/Exercise - 07/23/17 0001      Knee/Hip Exercises: Stretches   Passive Hamstring Stretch  Left;3 reps;30 seconds    Passive Hamstring Stretch Limitations  standing, 12" step    Knee: Self-Stretch to increase Flexion  Left;10 seconds    Knee: Self-Stretch Limitations  knee drives on 8in step 10x10" for flexion    Gastroc Stretch  Both;3 reps;30 seconds    Gastroc Stretch Limitations  slant board  Knee/Hip Exercises: Aerobic   Stationary Bike  x3 mins, seat 9, retro and fwd revolutions for ROM (x2 mins each)      Knee/Hip Exercises: Standing   Heel Raises  Both    Knee Flexion  15 reps;2 sets;Left    Lateral Step Up  Left;15 reps;Hand Hold: 1;Step Height: 4"    Forward Step Up  Left;15 reps;Hand Hold: 1;Step Height: 4"    Step Down  Left;15 reps;Hand Hold: 0;Step Height: 4"    Functional Squat  10 reps;Limitations    Functional Squat Limitations  chair taps    Gait Training  26ft focusing on heel strike and knee flexion during gait with SPC    Other Standing Knee Exercises  sidestep 20ft x3RT with RTB       Knee/Hip Exercises: Supine   Knee Extension  AROM    Knee Extension Limitations  0    Knee Flexion  AROM    Knee Flexion Limitations  106      Knee/Hip Exercises: Prone   Contract/Relax to Increase Flexion  5x 10" holds               PT Short Term Goals -  07/14/17 1520      PT SHORT TERM GOAL #1   Title  Patient will be independent with HEP to demonstrate improved independence with exercise and advance functional strengthening.    Time  2    Period  Weeks    Status  Achieved      PT SHORT TERM GOAL #2   Title  Patient will have improve AROM into flexion for left knee by 8 degrees to demonstrate significant change and improvement in joint mobility for improved functional mobility wiht gait and stairs.    Baseline  07/14/17 - 0*-100*    Time  3    Period  Weeks    Status  Achieved      PT SHORT TERM GOAL #3   Title  Patient will demonstrate improved MMT for limited muscle groups by 1/2 grade to improve functional strength for improve gait quality and greater muscular endurance with functional activity.    Baseline  07/14/17 - all improvedb y 1/2 grade some by full grade    Time  3    Period  Weeks    Status  Achieved      PT SHORT TERM GOAL #4   Title  Patient will perform TUG wiht LRAD in less than or equal to 12 seconds to reduce fall risk during dynamic gait activitites.    Baseline  07/14/17 - 10.1 seconds with SPC    Time  3    Period  Weeks    Status  Achieved        PT Long Term Goals - 07/14/17 1520      PT LONG TERM GOAL #1   Title  Patient will have improve AROM into flexion for left knee to 110 degrees to have improvement in joint mobility for improved functional mobility wiht gait and stairs.    Baseline  07/14/17 - 0*-100*    Time  6    Period  Weeks    Status  On-going      PT LONG TERM GOAL #2   Title  Patient will demonstrate improved MMT for limited muscle groups by 1 grade to improve functional strength for improve gait quality and greater muscular endurance with functional activity.    Baseline  07/14/17 - all improved by 1/2 grade  some by full grade    Time  6    Period  Weeks    Status  On-going      PT LONG TERM GOAL #3   Title  Patient will improve 5x sit to stand testing in less than 15 seconds to  demosntrate significant improvement in bil LE strength and decrease risk of falling.    Baseline  07/14/17 - 13.5 seconds    Time  6    Period  Weeks    Status  Achieved      PT LONG TERM GOAL #4   Title  Patient will have decreased circumferential edema measurement to be no more than 1 cm greater above and below the patella from right to left knee.    Baseline  07/14/17 - no change    Time  6    Period  Weeks    Status  On-going      PT LONG TERM GOAL #5   Title  Patient will ambulate wtih LRAD at safe community velocity of 1.2 m/s to demosntrate improved/safe functional gait speed to reduce fall risk and improve community mobility.    Baseline  07/14/17 - 0.92 m/s with SPC    Time  3    Period  Weeks    Status  New    Target Date  08/06/17            Plan - 07/23/17 1830    Clinical Impression Statement  Continued session focus iwht knee mobiltiy and functional strengthening.  Added contract/relax techniques to improve flexion with AROM at 0-106 EOS.  Pt continues to demonstrate compensation with step training and trendelendberg gait mechanics due to weakness.  Therapist facilitation to reduce compensation for functional strengthening.  No reports of pain through session.      Clinical Impairments Affecting Rehab Potential  (+) motivation, (+) positive experience with PT for right TKA, (-) edema    PT Frequency  3x / week    PT Duration  6 weeks    PT Treatment/Interventions  ADLs/Self Care Home Management;Cryotherapy;Electrical Stimulation;DME Instruction;Gait training;Stair training;Functional mobility training;Therapeutic activities;Therapeutic exercise;Balance training;Neuromuscular re-education;Patient/family education;Manual techniques;Scar mobilization;Passive range of motion;Energy conservation;Taping    PT Next Visit Plan  Continue with bike and continue focus with knee mobiltiy.  Include manual therapy for MFR scar tissue restrictions, joint mobs and edema control.  Advance  step up training and gait training with/without AD. add hip hikes, continue contract relax for improved knee flexion    PT Home Exercise Plan  Eval: heel raises, heel slides with flexion stretch; 07/02/2017: SLR, bridge and standing TKE.       Patient will benefit from skilled therapeutic intervention in order to improve the following deficits and impairments:  Decreased balance, Abnormal gait, Decreased endurance, Decreased mobility, Difficulty walking, Hypomobility, Decreased range of motion, Decreased scar mobility, Increased edema, Improper body mechanics, Decreased activity tolerance, Decreased knowledge of use of DME, Decreased strength, Increased fascial restricitons, Impaired flexibility, Postural dysfunction  Visit Diagnosis: Left knee pain, unspecified chronicity  Stiffness of left knee, not elsewhere classified  Other abnormalities of gait and mobility  Muscle weakness (generalized)     Problem List Patient Active Problem List   Diagnosis Date Noted  . S/P total knee arthroplasty, left 04/29/17 05/06/2017  . Anemia 05/06/2017  . Primary osteoarthritis of left knee 04/29/2017  . Primary osteoarthritis of both knees   . Abnormal CT scan, colon 01/18/2016  . Dyspnea   . Abnormal myocardial perfusion study   .  Fatigue 07/18/2014  . Vitamin D deficiency 07/18/2014  . Exercise intolerance 07/18/2014  . Sleep disorder 07/18/2014  . Nonspecific abnormal electrocardiogram (ECG) (EKG) 07/18/2014  . Reduced vision 12/27/2013  . Radicular pain of thoracic region 06/22/2013  . Prediabetes 10/20/2012  . Metabolic syndrome X 10/20/2012  . BACK PAIN WITH RADICULOPATHY 09/20/2008  . Hyperlipidemia LDL goal <100 03/29/2008  . Morbid obesity (HCC) 03/29/2008  . Essential hypertension 10/07/2007   Becky Sax, LPTA; CBIS [phone]  Juel Burrow 07/23/2017, 6:35 PM  [facility] [address] [place], [place],  [address] Phone: [phone]   Fax:  [phone]  Name: Patricia Fuentes MRN: [id number] Date of Birth: 16-Oct-1945

## 2017-07-25 ENCOUNTER — Encounter (HOSPITAL_COMMUNITY): Payer: Self-pay

## 2017-07-25 ENCOUNTER — Ambulatory Visit (HOSPITAL_COMMUNITY): Payer: Medicare HMO | Attending: Orthopedic Surgery

## 2017-07-25 DIAGNOSIS — M6281 Muscle weakness (generalized): Secondary | ICD-10-CM | POA: Diagnosis not present

## 2017-07-25 DIAGNOSIS — M25662 Stiffness of left knee, not elsewhere classified: Secondary | ICD-10-CM | POA: Diagnosis not present

## 2017-07-25 DIAGNOSIS — M25562 Pain in left knee: Secondary | ICD-10-CM

## 2017-07-25 DIAGNOSIS — R2689 Other abnormalities of gait and mobility: Secondary | ICD-10-CM | POA: Diagnosis not present

## 2017-07-25 NOTE — Therapy (Signed)
[place] [facility] [address] [place], [place], [address] Phone: [phone]   Fax:  [phone]  Physical Therapy Treatment  Patient Details  Name: Patricia Fuentes MRN: [id number] Date of Birth: 1945-12-13 Referring Provider: Fuller CanadaStanley Harrison   Encounter Date: 07/25/2017  PT End of Session - 07/25/17 1525    Visit Number  10    Number of Visits  19    Date for PT Re-Evaluation  08/08/17    Authorization Type  [employer]    Authorization Time Period  06/25/17- 08/08/17    PT Start Time  1521    PT Stop Time  1606    PT Time Calculation (min)  45 min    Activity Tolerance  Patient tolerated treatment well;No increased pain    Behavior During Therapy  WFL for tasks assessed/performed       Past Medical History:  Diagnosis Date  . Bronchitis   . Chronic back pain   . Essential hypertension   . Hyperlipidemia   . IGT (impaired glucose tolerance) 2015  . Obesity, unspecified   . Osteoarthritis   . Seasonal allergies     Past Surgical History:  Procedure Laterality Date  . CARDIAC CATHETERIZATION N/A 10/25/2014   Procedure: Left Heart Cath and Coronary Angiography;  Surgeon: Kathleene Hazelhristopher D McAlhany, MD;  Location: [facility]VAS[facility] CV LAB;  Service: Cardiovascular;  Laterality: N/A;  . COLONOSCOPY N/A 02/09/2016   Procedure: COLONOSCOPY;  Surgeon: West BaliSandi L Fields, MD;  Location: [facility] ENDO SUITE;  Service: Endoscopy;  Laterality: N/A;  12:30 PM  . KNEE ARTHROSCOPY Left   . TOTAL ABDOMINAL HYSTERECTOMY  1995 approx   Fibroids  . TOTAL KNEE ARTHROPLASTY Right 2011  . TOTAL KNEE ARTHROPLASTY Left 04/29/2017   Procedure: TOTAL KNEE ARTHROPLASTY;  Surgeon: Vickki HearingHarrison, Stanley E, MD;  Location: [facility] ORS;  Service: Orthopedics;  Laterality: Left;    There were no vitals filed for this visit.  Subjective Assessment - 07/25/17 1525    Subjective  Pt states that her knee is feeling good, no pain currently.     Patient Stated Goals  be able to bend leg  more and walk easier    Currently in Pain?  No/denies         [facility] - 07/25/17 0001      Circumferential Edema   Circumferential - Left   57.5cm suprapatellar; 49cm infrapatellar           [facility] Adult PT Treatment/Exercise - 07/25/17 0001      Knee/Hip Exercises: Stretches   Passive Hamstring Stretch  Left;3 reps;30 seconds    Passive Hamstring Stretch Limitations  standing, 12" step    Knee: Self-Stretch to increase Flexion  Left;10 seconds    Knee: Self-Stretch Limitations  knee drives on 8in step 10x10" for flexion    Gastroc Stretch  Both;3 reps;30 seconds    Gastroc Stretch Limitations  slant board      Knee/Hip Exercises: Standing   Forward Lunges  Left;15 reps;Limitations    Forward Lunges Limitations  front foot on 4" step    Lateral Step Up  Left;15 reps;Hand Hold: 1;Step Height: 6"    Forward Step Up  Left;15 reps;Hand Hold: 1;Step Height: 6"    Step Down  Left;15 reps;Hand Hold: 0;Step Height: 6"    Functional Squat  15 reps;Limitations    Functional Squat Limitations  chair taps    Other Standing Knee Exercises  L hip hikes on 4" step x10  reps    Other Standing Knee Exercises  sidestep 10ft x2RT GTB      Knee/Hip Exercises: Seated   Long Arc Quad  Left;15 reps;Limitations    Long Arc Quad Weight  4 lbs.    Long [employer] Limitations  3" holds      Knee/Hip Exercises: Supine   Knee Extension Limitations  0    Knee Flexion Limitations  110      Knee/Hip Exercises: Prone   Hamstring Curl  15 reps;3 seconds    Hamstring Curl Limitations  2#, 2-3" holds    Contract/Relax to Increase Flexion  5x 10" holds    Other Prone Exercises  prone TKE 10x3" holds (2# weight on knee for proprioception)      Manual Therapy   Manual Therapy  Joint mobilization    Manual therapy comments  Manual complete separate of rest of tx    Joint Mobilization  patella mobs all direcitons and tib/fib; AP glide to tibiofemoral for flexion, grade III/IV 5x 30-45 seconds  each           PT Education - 07/25/17 1608    Education provided  Yes    Education Details  exercise technique    Person(s) Educated  Patient    Methods  Explanation;Demonstration    Comprehension  Verbalized understanding;Returned demonstration       PT Short Term Goals - 07/14/17 1520      PT SHORT TERM GOAL #1   Title  Patient will be independent with HEP to demonstrate improved independence with exercise and advance functional strengthening.    Time  2    Period  Weeks    Status  Achieved      PT SHORT TERM GOAL #2   Title  Patient will have improve AROM into flexion for left knee by 8 degrees to demonstrate significant change and improvement in joint mobility for improved functional mobility wiht gait and stairs.    Baseline  07/14/17 - 0*-100*    Time  3    Period  Weeks    Status  Achieved      PT SHORT TERM GOAL #3   Title  Patient will demonstrate improved MMT for limited muscle groups by 1/2 grade to improve functional strength for improve gait quality and greater muscular endurance with functional activity.    Baseline  07/14/17 - all improvedb y 1/2 grade some by full grade    Time  3    Period  Weeks    Status  Achieved      PT SHORT TERM GOAL #4   Title  Patient will perform TUG wiht LRAD in less than or equal to 12 seconds to reduce fall risk during dynamic gait activitites.    Baseline  07/14/17 - 10.1 seconds with SPC    Time  3    Period  Weeks    Status  Achieved        PT Long Term Goals - 07/14/17 1520      PT LONG TERM GOAL #1   Title  Patient will have improve AROM into flexion for left knee to 110 degrees to have improvement in joint mobility for improved functional mobility wiht gait and stairs.    Baseline  07/14/17 - 0*-100*    Time  6    Period  Weeks    Status  On-going      PT LONG TERM GOAL #2   Title  Patient will demonstrate improved  MMT for limited muscle groups by 1 grade to improve functional strength for improve gait quality  and greater muscular endurance with functional activity.    Baseline  07/14/17 - all improved by 1/2 grade some by full grade    Time  6    Period  Weeks    Status  On-going      PT LONG TERM GOAL #3   Title  Patient will improve 5x sit to stand testing in less than 15 seconds to demosntrate significant improvement in bil LE strength and decrease risk of falling.    Baseline  07/14/17 - 13.5 seconds    Time  6    Period  Weeks    Status  Achieved      PT LONG TERM GOAL #4   Title  Patient will have decreased circumferential edema measurement to be no more than 1 cm greater above and below the patella from right to left knee.    Baseline  07/14/17 - no change    Time  6    Period  Weeks    Status  On-going      PT LONG TERM GOAL #5   Title  Patient will ambulate wtih LRAD at safe community velocity of 1.2 m/s to demosntrate improved/safe functional gait speed to reduce fall risk and improve community mobility.    Baseline  07/14/17 - 0.92 m/s with SPC    Time  3    Period  Weeks    Status  New    Target Date  08/06/17            Plan - 07/25/17 1608    Clinical Impression Statement  Pt presenting to therapy reporting no knee pain. She tolerated progressed step height for step ups/downs this date without reports of pain. Added hip hikes for hip strengthening which required verbal and tactile cues for proper form. Resumedjoint mobs this date without reports of pain. AROM 0-110 this date. Continue as planned, progressing as able.    Clinical Impairments Affecting Rehab Potential  (+) motivation, (+) positive experience with PT for right TKA, (-) edema    PT Frequency  3x / week    PT Duration  6 weeks    PT Treatment/Interventions  ADLs/Self Care Home Management;Cryotherapy;Electrical Stimulation;DME Instruction;Gait training;Stair training;Functional mobility training;Therapeutic activities;Therapeutic exercise;Balance training;Neuromuscular re-education;Patient/family  education;Manual techniques;Scar mobilization;Passive range of motion;Energy conservation;Taping    PT Next Visit Plan  Continue with bike and continue focus with knee mobiltiy.  Include manual therapy for MFR scar tissue restrictions, joint mobs and edema control.  Advance step up training and gait training with/without AD. continue hip hikes, continue contract relax for improved knee flexion; continue joint mobs for ROM    PT Home Exercise Plan  Eval: heel raises, heel slides with flexion stretch; 07/02/2017: SLR, bridge and standing TKE.    Consulted and Agree with Plan of Care  Patient       Patient will benefit from skilled therapeutic intervention in order to improve the following deficits and impairments:  Decreased balance, Abnormal gait, Decreased endurance, Decreased mobility, Difficulty walking, Hypomobility, Decreased range of motion, Decreased scar mobility, Increased edema, Improper body mechanics, Decreased activity tolerance, Decreased knowledge of use of DME, Decreased strength, Increased fascial restricitons, Impaired flexibility, Postural dysfunction  Visit Diagnosis: Left knee pain, unspecified chronicity  Stiffness of left knee, not elsewhere classified  Other abnormalities of gait and mobility  Muscle weakness (generalized)     Problem List Patient Active Problem  List   Diagnosis Date Noted  . S/P total knee arthroplasty, left 04/29/17 05/06/2017  . Anemia 05/06/2017  . Primary osteoarthritis of left knee 04/29/2017  . Primary osteoarthritis of both knees   . Abnormal CT scan, colon 01/18/2016  . Dyspnea   . Abnormal myocardial perfusion study   . Fatigue 07/18/2014  . Vitamin D deficiency 07/18/2014  . Exercise intolerance 07/18/2014  . Sleep disorder 07/18/2014  . Nonspecific abnormal electrocardiogram (ECG) (EKG) 07/18/2014  . Reduced vision 12/27/2013  . Radicular pain of thoracic region 06/22/2013  . Prediabetes 10/20/2012  . Metabolic syndrome X  10/20/2012  . BACK PAIN WITH RADICULOPATHY 09/20/2008  . Hyperlipidemia LDL goal <100 03/29/2008  . Morbid obesity (HCC) 03/29/2008  . Essential hypertension 10/07/2007       Jac Canavan PT, DPT  [place] [facility] [address] [place], [place], [address] Phone: [phone]   Fax:  [phone]  Name: Patricia Fuentes MRN: [id number] Date of Birth: Jun 02, 1945

## 2017-07-26 NOTE — Assessment & Plan Note (Signed)
Dx with CAP 2 weeks ago in the ED. Improved, currently asymptomatic with normal exam.  Needs repeat CXR in 2 weeks

## 2017-07-26 NOTE — Assessment & Plan Note (Signed)
Sub optimal control No med change DASH diet and commitment to daily physical activity for a minimum of 30 minutes discussed and encouraged, as a part of hypertension management. The importance of attaining a healthy weight is also discussed.  BP/Weight 07/23/2017 07/22/2017 07/09/2017 07/05/2017 06/11/2017 05/29/2017 05/18/2017  Systolic BP 118 140 105 149 142 154 130  Diastolic BP 63 80 92 55 72 80 70  Wt. (Lbs) 248 246 244 244 248 248 -  BMI 45.36 44.99 44.63 44.63 45.36 45.36 -

## 2017-07-26 NOTE — Assessment & Plan Note (Signed)
1 month h/o frequency, check for UTI, if negative may need trial of med for incontinence

## 2017-07-26 NOTE — Progress Notes (Signed)
   Patricia Fuentes     MRN: [id number]      DOB: [birth year]   HPI Patricia Fuentes is here for follow up of Ed visit on 07/09/2017 when she was diagnosed with CAP. Still has some cough and sputum production, denies any current fever or chills. C/o urinary frequency and urgency for the past 1 month, no flank pain ROS . Denies sinus pressure, nasal congestion, ear pain or sore throat. . Denies chest pains, palpitations and leg swelling Denies abdominal pain, nausea, vomiting,diarrhea or constipation.   C/o urinary frequency x 1 month 3 day h/o right shoulder pain and reduced mobility, no trauma Denies headaches, seizures, numbness, or tingling. Denies depression, anxiety or insomnia. Denies skin break down or rash.   PE  BP 140/80   Pulse 75   Resp 16   Ht 5\' 2"  (1.575 m)   Wt 246 lb (111.6 kg)   SpO2 91%   BMI 44.99 kg/m   Patient alert and oriented and in no cardiopulmonary distress.  HEENT: No facial asymmetry, EOMI,   oropharynx pink and moist.  Neck supple no JVD, no mass.  Chest:adequate air entry , no crackles or wheezes CVS: S1, S2 no murmurs, no S3.Regular rate.  ABD: Soft non tender.   Ext: No edema  MS: Adequate ROM spine, decreased in right shoulder and knees  Skin: Intact, no ulcerations or rash noted.  Psych: Good eye contact, normal affect. Memory intact not anxious or depressed appearing.  CNS: CN 2-12 intact, power,  normal throughout.no focal deficits noted.   Assessment & Plan  Encounter for examination following treatment at hospital Dx with CAP 2 weeks ago in the ED. Improved, currently asymptomatic with normal exam.  Needs repeat CXR in 2 weeks  Urinary frequency 1 month h/o frequency, check for UTI, if negative may need trial of med for incontinence   Essential hypertension Sub optimal control No med change DASH diet and commitment to daily physical activity for a minimum of 30 minutes discussed and encouraged, as a part of hypertension  management. The importance of attaining a healthy weight is also discussed.  BP/Weight 07/23/2017 07/22/2017 07/09/2017 07/05/2017 06/11/2017 05/29/2017 05/18/2017  Systolic BP 118 140 105 149 142 154 130  Diastolic BP 63 80 92 55 72 80 70  Wt. (Lbs) 248 246 244 244 248 248 -  BMI 45.36 44.99 44.63 44.63 45.36 45.36 -

## 2017-07-29 ENCOUNTER — Ambulatory Visit (HOSPITAL_COMMUNITY): Payer: Medicare HMO | Admitting: Physical Therapy

## 2017-07-29 DIAGNOSIS — M6281 Muscle weakness (generalized): Secondary | ICD-10-CM | POA: Diagnosis not present

## 2017-07-29 DIAGNOSIS — R2689 Other abnormalities of gait and mobility: Secondary | ICD-10-CM

## 2017-07-29 DIAGNOSIS — M25662 Stiffness of left knee, not elsewhere classified: Secondary | ICD-10-CM

## 2017-07-29 DIAGNOSIS — M25562 Pain in left knee: Secondary | ICD-10-CM

## 2017-07-29 NOTE — Therapy (Signed)
[place] [facility] [address] [place], [place], [address] Phone: [phone]   Fax:  [phone]  Physical Therapy Treatment  Patient Details  Name: Patricia EmperorSylvia A Laakso MRN: [id number] Date of Birth: November 13, 1945 Referring Provider: Fuller CanadaStanley Harrison   Encounter Date: 07/29/2017  PT End of Session - 07/29/17 1534    Visit Number  11    Number of Visits  19    Date for PT Re-Evaluation  08/08/17    Authorization Type  [employer]    Authorization Time Period  06/25/17- 08/08/17    PT Start Time  1435    PT Stop Time  1515    PT Time Calculation (min)  40 min    Activity Tolerance  Patient tolerated treatment well;No increased pain    Behavior During Therapy  WFL for tasks assessed/performed       Past Medical History:  Diagnosis Date  . Bronchitis   . Chronic back pain   . Essential hypertension   . Hyperlipidemia   . IGT (impaired glucose tolerance) 2015  . Obesity, unspecified   . Osteoarthritis   . Seasonal allergies     Past Surgical History:  Procedure Laterality Date  . CARDIAC CATHETERIZATION N/A 10/25/2014   Procedure: Left Heart Cath and Coronary Angiography;  Surgeon: Kathleene Hazelhristopher D McAlhany, MD;  [facility];  Service: Cardiovascular;  Laterality: N/A;  . COLONOSCOPY N/A 02/09/2016   Procedure: COLONOSCOPY;  Surgeon: West BaliSandi L Fields, MD;  Location: [facility];  Service: Endoscopy;  Laterality: N/A;  12:30 PM  . KNEE ARTHROSCOPY Left   . TOTAL ABDOMINAL HYSTERECTOMY  1995 approx   Fibroids  . TOTAL KNEE ARTHROPLASTY Right 2011  . TOTAL KNEE ARTHROPLASTY Left 04/29/2017   Procedure: TOTAL KNEE ARTHROPLASTY;  Surgeon: Vickki HearingHarrison, Stanley E, MD;  Location: [facility] ORS;  Service: Orthopedics;  Laterality: Left;    There were no vitals filed for this visit.  Subjective Assessment - 07/29/17 1439    Subjective  Pt states she is doing well today.  Mostly walking without SPC now.      Currently in Pain?  No/denies                       [facility] - 07/29/17 0001      Knee/Hip Exercises: Stretches   Passive Hamstring Stretch  Left;3 reps;30 seconds    Passive Hamstring Stretch Limitations  standing, 12" step    Knee: Self-Stretch to increase Flexion  Left;10 seconds    Knee: Self-Stretch Limitations  knee drives on 8in step 10x10" for flexion    Gastroc Stretch  Both;3 reps;30 seconds    Gastroc Stretch Limitations  slant board      Knee/Hip Exercises: Standing   Forward Lunges  Left;15 reps;Limitations    Forward Lunges Limitations  front foot on 2" step no UE assist    Lateral Step Up  Left;15 reps;Hand Hold: 1;Step Height: 6"    Forward Step Up  Left;15 reps;Hand Hold: 1;Step Height: 6"    Step Down  Left;15 reps;Hand Hold: 0;Step Height: 4"    Functional Squat  15 reps;Limitations    Functional Squat Limitations  chair taps    Stairs  7" Bil UE's to complete reciprocally, step to up/down with 1 HR    Other Standing Knee Exercises  L hip hikes on 2" step x15 reps    Other Standing Knee Exercises  sidestep 1015ft x2RT BTB  Knee/Hip Exercises: Supine   Knee Extension Limitations  0    Knee Flexion Limitations  110               PT Short Term Goals - 07/14/17 1520      PT SHORT TERM GOAL #1   Title  Patient will be independent with HEP to demonstrate improved independence with exercise and advance functional strengthening.    Time  2    Period  Weeks    Status  Achieved      PT SHORT TERM GOAL #2   Title  Patient will have improve AROM into flexion for left knee by 8 degrees to demonstrate significant change and improvement in joint mobility for improved functional mobility wiht gait and stairs.    Baseline  07/14/17 - 0*-100*    Time  3    Period  Weeks    Status  Achieved      PT SHORT TERM GOAL #3   Title  Patient will demonstrate improved MMT for limited muscle groups by 1/2 grade to improve functional strength for improve gait  quality and greater muscular endurance with functional activity.    Baseline  07/14/17 - all improvedb y 1/2 grade some by full grade    Time  3    Period  Weeks    Status  Achieved      PT SHORT TERM GOAL #4   Title  Patient will perform TUG wiht LRAD in less than or equal to 12 seconds to reduce fall risk during dynamic gait activitites.    Baseline  07/14/17 - 10.1 seconds with SPC    Time  3    Period  Weeks    Status  Achieved        PT Long Term Goals - 07/14/17 1520      PT LONG TERM GOAL #1   Title  Patient will have improve AROM into flexion for left knee to 110 degrees to have improvement in joint mobility for improved functional mobility wiht gait and stairs.    Baseline  07/14/17 - 0*-100*    Time  6    Period  Weeks    Status  On-going      PT LONG TERM GOAL #2   Title  Patient will demonstrate improved MMT for limited muscle groups by 1 grade to improve functional strength for improve gait quality and greater muscular endurance with functional activity.    Baseline  07/14/17 - all improved by 1/2 grade some by full grade    Time  6    Period  Weeks    Status  On-going      PT LONG TERM GOAL #3   Title  Patient will improve 5x sit to stand testing in less than 15 seconds to demosntrate significant improvement in bil LE strength and decrease risk of falling.    Baseline  07/14/17 - 13.5 seconds    Time  6    Period  Weeks    Status  Achieved      PT LONG TERM GOAL #4   Title  Patient will have decreased circumferential edema measurement to be no more than 1 cm greater above and below the patella from right to left knee.    Baseline  07/14/17 - no change    Time  6    Period  Weeks    Status  On-going      PT LONG TERM GOAL #5   Title  Patient will ambulate wtih LRAD at safe community velocity of 1.2 m/s to demosntrate improved/safe functional gait speed to reduce fall risk and improve community mobility.    Baseline  07/14/17 - 0.92 m/s with SPC    Time  3     Period  Weeks    Status  New    Target Date  08/06/17            Plan - 07/29/17 1545    Clinical Impression Statement  Pt without pain with 40% use of SPC and 60% no AD.  Weakness exhibited with step activity in Lt LE with need of UE assist to complete at 6" height or greater.  Pt with less cues to complete hip hike correctly.  AROM remains 0-110 with patient verbalizing no significant issues with functional actvities.  No issues or increase in pain reported at end of session.     Clinical Impairments Affecting Rehab Potential  (+) motivation, (+) positive experience with PT for right TKA, (-) edema    PT Frequency  3x / week    PT Duration  6 weeks    PT Treatment/Interventions  ADLs/Self Care Home Management;Cryotherapy;Electrical Stimulation;DME Instruction;Gait training;Stair training;Functional mobility training;Therapeutic activities;Therapeutic exercise;Balance training;Neuromuscular re-education;Patient/family education;Manual techniques;Scar mobilization;Passive range of motion;Energy conservation;Taping    PT Next Visit Plan  Continue with attempt to achieve full flexion in Lt knee.  Progress functional strengthening, specifically working with stairs (pt will have to negotiate steps for RTW).  Complete manual as needed.      PT Home Exercise Plan  Eval: heel raises, heel slides with flexion stretch; 07/02/2017: SLR, bridge and standing TKE.    Consulted and Agree with Plan of Care  Patient       Patient will benefit from skilled therapeutic intervention in order to improve the following deficits and impairments:  Decreased balance, Abnormal gait, Decreased endurance, Decreased mobility, Difficulty walking, Hypomobility, Decreased range of motion, Decreased scar mobility, Increased edema, Improper body mechanics, Decreased activity tolerance, Decreased knowledge of use of DME, Decreased strength, Increased fascial restricitons, Impaired flexibility, Postural dysfunction  Visit  Diagnosis: Left knee pain, unspecified chronicity  Stiffness of left knee, not elsewhere classified  Other abnormalities of gait and mobility     Problem List Patient Active Problem List   Diagnosis Date Noted  . S/P total knee arthroplasty, left 04/29/17 05/06/2017  . Anemia 05/06/2017  . Primary osteoarthritis of left knee 04/29/2017  . Primary osteoarthritis of both knees   . Abnormal CT scan, colon 01/18/2016  . Urinary frequency 01/06/2016  . Encounter for examination following treatment at hospital 01/02/2016  . Dyspnea   . Abnormal myocardial perfusion study   . Fatigue 07/18/2014  . Vitamin D deficiency 07/18/2014  . Exercise intolerance 07/18/2014  . Sleep disorder 07/18/2014  . Nonspecific abnormal electrocardiogram (ECG) (EKG) 07/18/2014  . Reduced vision 12/27/2013  . Radicular pain of thoracic region 06/22/2013  . Prediabetes 10/20/2012  . Metabolic syndrome X 10/20/2012  . BACK PAIN WITH RADICULOPATHY 09/20/2008  . Hyperlipidemia LDL goal <100 03/29/2008  . Morbid obesity (HCC) 03/29/2008  . Essential hypertension 10/07/2007   Lurena Nida, PTA/CLT [phone]  Lurena Nida 07/29/2017, 3:59 PM  [place] [facility] [address] [place], [place], [address] Phone: [phone]   Fax:  [phone]  Name: GHADEER KASTELIC MRN: [id number] Date of Birth: 1945/11/17

## 2017-07-31 ENCOUNTER — Ambulatory Visit (HOSPITAL_COMMUNITY): Payer: Medicare HMO | Admitting: Physical Therapy

## 2017-07-31 ENCOUNTER — Encounter (HOSPITAL_COMMUNITY): Payer: Self-pay | Admitting: Physical Therapy

## 2017-07-31 ENCOUNTER — Other Ambulatory Visit: Payer: Self-pay

## 2017-07-31 DIAGNOSIS — M6281 Muscle weakness (generalized): Secondary | ICD-10-CM | POA: Diagnosis not present

## 2017-07-31 DIAGNOSIS — M25562 Pain in left knee: Secondary | ICD-10-CM

## 2017-07-31 DIAGNOSIS — M25662 Stiffness of left knee, not elsewhere classified: Secondary | ICD-10-CM

## 2017-07-31 DIAGNOSIS — R2689 Other abnormalities of gait and mobility: Secondary | ICD-10-CM | POA: Diagnosis not present

## 2017-07-31 NOTE — Therapy (Signed)
[facility] [address] [place], [place], [address] Phone: [phone]   Fax:  [phone]  Physical Therapy Treatment  Patient Details  Name: Patricia Fuentes MRN: [id number] Date of Birth: January 02, 1946 Referring Provider: Fuller Canada   Encounter Date: 07/31/2017  PT End of Session - 07/31/17 1549    Visit Number  12    Number of Visits  19    Date for PT Re-Evaluation  08/08/17    Authorization Type  [employer]    Authorization Time Period  06/25/17- 08/08/17    PT Start Time  1522    PT Stop Time  1601    PT Time Calculation (min)  39 min    Activity Tolerance  Patient tolerated treatment well;No increased pain    Behavior During Therapy  WFL for tasks assessed/performed       Past Medical History:  Diagnosis Date  . Bronchitis   . Chronic back pain   . Essential hypertension   . Hyperlipidemia   . IGT (impaired glucose tolerance) 2015  . Obesity, unspecified   . Osteoarthritis   . Seasonal allergies     Past Surgical History:  Procedure Laterality Date  . CARDIAC CATHETERIZATION N/A 10/25/2014   Procedure: Left Heart Cath and Coronary Angiography;  Surgeon: Kathleene Hazel, MD;  Location: [facility] INVASIVE CV LAB;  Service: Cardiovascular;  Laterality: N/A;  . COLONOSCOPY N/A 02/09/2016   Procedure: COLONOSCOPY;  Surgeon: West Bali, MD;  Location: [facility];  Service: Endoscopy;  Laterality: N/A;  12:30 PM  . KNEE ARTHROSCOPY Left   . TOTAL ABDOMINAL HYSTERECTOMY  1995 approx   Fibroids  . TOTAL KNEE ARTHROPLASTY Right 2011  . TOTAL KNEE ARTHROPLASTY Left 04/29/2017   Procedure: TOTAL KNEE ARTHROPLASTY;  Surgeon: Vickki Hearing, MD;  Location: [facility] OR[facility];  Service: Orthopedics;  Laterality: Left;    There were no vitals filed for this visit.  Subjective Assessment - 07/31/17 1526    Subjective  Pt states that she is doing her exercises.  Has no complaints     Currently in Pain?  Yes    Pain Score   2     Pain Location  Knee    Pain Orientation  Left    Pain Descriptors / Indicators  Aching    Pain Onset  1 to 4 weeks ago    Pain Frequency  Intermittent    Aggravating Factors   bending     Pain Relieving Factors  icing     Effect of Pain on Daily Activities  limits                      [facility] Adult PT Treatment/Exercise - 07/31/17 0001      Exercises   Exercises  Knee/Hip      Knee/Hip Exercises: Stretches   Passive Hamstring Stretch  Left;3 reps;30 seconds    Quad Stretch  3 reps;30 seconds    Knee: Self-Stretch to increase Flexion  Left;30 seconds;5 reps    Knee: Self-Stretch Limitations  knee drives on 8in step 10x10" for flexion    Gastroc Stretch  Both;3 reps;30 seconds    Gastroc Stretch Limitations  slant board      Knee/Hip Exercises: Standing   Heel Raises  15 reps    Knee Flexion  Left;5 reps;2 sets    Terminal Knee Extension  Left;10 reps    Lateral Step Up  Step Height: 6";Left;Hand Hold: 1;5  reps;2 sets    Forward Step Up  Step Height: 6";10 reps;Left;Hand Hold: 1    Step Down  10 reps;Hand Hold: 1;Step Height: 6"    Stairs  4" 2 RT     Rocker Board  2 minutes    SLS  LLE only, 5x10" with 1 fingertip assist 2 fingertip hold     Other Standing Knee Exercises  tandem stance 30 " x 2 both with Rt and LT in front       Knee/Hip Exercises: Seated   Sit to Sand  10 reps      Knee/Hip Exercises: Supine   Heel Slides  5 reps      Knee/Hip Exercises: Prone   Contract/Relax to Increase Flexion  5x 10" holds               PT Short Term Goals - 07/14/17 1520      PT SHORT TERM GOAL #1   Title  Patient will be independent with HEP to demonstrate improved independence with exercise and advance functional strengthening.    Time  2    Period  Weeks    Status  Achieved      PT SHORT TERM GOAL #2   Title  Patient will have improve AROM into flexion for left knee by 8 degrees to demonstrate significant change and improvement in joint  mobility for improved functional mobility wiht gait and stairs.    Baseline  07/14/17 - 0*-100*    Time  3    Period  Weeks    Status  Achieved      PT SHORT TERM GOAL #3   Title  Patient will demonstrate improved MMT for limited muscle groups by 1/2 grade to improve functional strength for improve gait quality and greater muscular endurance with functional activity.    Baseline  07/14/17 - all improvedb y 1/2 grade some by full grade    Time  3    Period  Weeks    Status  Achieved      PT SHORT TERM GOAL #4   Title  Patient will perform TUG wiht LRAD in less than or equal to 12 seconds to reduce fall risk during dynamic gait activitites.    Baseline  07/14/17 - 10.1 seconds with SPC    Time  3    Period  Weeks    Status  Achieved        PT Long Term Goals - 07/14/17 1520      PT LONG TERM GOAL #1   Title  Patient will have improve AROM into flexion for left knee to 110 degrees to have improvement in joint mobility for improved functional mobility wiht gait and stairs.    Baseline  07/14/17 - 0*-100*    Time  6    Period  Weeks    Status  On-going      PT LONG TERM GOAL #2   Title  Patient will demonstrate improved MMT for limited muscle groups by 1 grade to improve functional strength for improve gait quality and greater muscular endurance with functional activity.    Baseline  07/14/17 - all improved by 1/2 grade some by full grade    Time  6    Period  Weeks    Status  On-going      PT LONG TERM GOAL #3   Title  Patient will improve 5x sit to stand testing in less than 15 seconds to demosntrate significant improvement in bil  LE strength and decrease risk of falling.    Baseline  07/14/17 - 13.5 seconds    Time  6    Period  Weeks    Status  Achieved      PT LONG TERM GOAL #4   Title  Patient will have decreased circumferential edema measurement to be no more than 1 cm greater above and below the patella from right to left knee.    Baseline  07/14/17 - no change    Time  6     Period  Weeks    Status  On-going      PT LONG TERM GOAL #5   Title  Patient will ambulate wtih LRAD at safe community velocity of 1.2 m/s to demosntrate improved/safe functional gait speed to reduce fall risk and improve community mobility.    Baseline  07/14/17 - 0.92 m/s with SPC    Time  3    Period  Weeks    Status  New    Target Date  08/06/17            Plan - 07/31/17 1551    Clinical Impression Statement  PT with increased fatigue today needing multiple rest breaks.  Increased knee flexion to 112 today following quad stretches and heelslides.      Clinical Impairments Affecting Rehab Potential  (+) motivation, (+) positive experience with PT for right TKA, (-) edema    PT Frequency  3x / week    PT Duration  6 weeks    PT Treatment/Interventions  ADLs/Self Care Home Management;Cryotherapy;Electrical Stimulation;DME Instruction;Gait training;Stair training;Functional mobility training;Therapeutic activities;Therapeutic exercise;Balance training;Neuromuscular re-education;Patient/family education;Manual techniques;Scar mobilization;Passive range of motion;Energy conservation;Taping    PT Next Visit Plan  Attempt normal size step as pt was able to complete last session but stated she did not have the strength this session.    PT Home Exercise Plan  Eval: heel raises, heel slides with flexion stretch; 07/02/2017: SLR, bridge and standing TKE.    Consulted and Agree with Plan of Care  Patient       Patient will benefit from skilled therapeutic intervention in order to improve the following deficits and impairments:  Decreased balance, Abnormal gait, Decreased endurance, Decreased mobility, Difficulty walking, Hypomobility, Decreased range of motion, Decreased scar mobility, Increased edema, Improper body mechanics, Decreased activity tolerance, Decreased knowledge of use of DME, Decreased strength, Increased fascial restricitons, Impaired flexibility, Postural dysfunction  Visit  Diagnosis: Left knee pain, unspecified chronicity  Stiffness of left knee, not elsewhere classified  Muscle weakness (generalized)     Problem List Patient Active Problem List   Diagnosis Date Noted  . S/P total knee arthroplasty, left 04/29/17 05/06/2017  . Anemia 05/06/2017  . Primary osteoarthritis of left knee 04/29/2017  . Primary osteoarthritis of both knees   . Abnormal CT scan, colon 01/18/2016  . Urinary frequency 01/06/2016  . Encounter for examination following treatment at hospital 01/02/2016  . Dyspnea   . Abnormal myocardial perfusion study   . Fatigue 07/18/2014  . Vitamin D deficiency 07/18/2014  . Exercise intolerance 07/18/2014  . Sleep disorder 07/18/2014  . Nonspecific abnormal electrocardiogram (ECG) (EKG) 07/18/2014  . Reduced vision 12/27/2013  . Radicular pain of thoracic region 06/22/2013  . Prediabetes 10/20/2012  . Metabolic syndrome X 10/20/2012  . BACK PAIN WITH RADICULOPATHY 09/20/2008  . Hyperlipidemia LDL goal <100 03/29/2008  . Morbid obesity (HCC) 03/29/2008  . Essential hypertension 10/07/2007    Virgina Organ, PT CLT [phone] 07/31/2017, 4:03 PM  [facility] [address] [place], [place], [address] Phone: [phone]   Fax:  [phone]  Name: Patricia Fuentes MRN: [id number] Date of Birth: 1946-04-13

## 2017-08-05 ENCOUNTER — Ambulatory Visit (HOSPITAL_COMMUNITY): Payer: Medicare HMO | Admitting: Physical Therapy

## 2017-08-05 DIAGNOSIS — R2689 Other abnormalities of gait and mobility: Secondary | ICD-10-CM

## 2017-08-05 DIAGNOSIS — M25562 Pain in left knee: Secondary | ICD-10-CM | POA: Diagnosis not present

## 2017-08-05 DIAGNOSIS — M6281 Muscle weakness (generalized): Secondary | ICD-10-CM | POA: Diagnosis not present

## 2017-08-05 DIAGNOSIS — M25662 Stiffness of left knee, not elsewhere classified: Secondary | ICD-10-CM

## 2017-08-05 NOTE — Therapy (Signed)
[place] [facility] [address] [place], [place], [address] Phone: [phone]   Fax:  [phone]  Physical Therapy Treatment  Patient Details  Name: Patricia Fuentes MRN: [id number] Date of Birth: 11/30/1945 Referring Provider: Fuller Canada   Encounter Date: 08/05/2017  PT End of Session - 08/05/17 1102    Visit Number  13    Number of Visits  19    Date for PT Re-Evaluation  08/08/17    Authorization Type  [employer]    Authorization Time Period  06/25/17- 08/08/17    PT Start Time  0954 pt was late    PT Stop Time  1034    PT Time Calculation (min)  40 min    Activity Tolerance  Patient tolerated treatment well;No increased pain    Behavior During Therapy  WFL for tasks assessed/performed       Past Medical History:  Diagnosis Date  . Bronchitis   . Chronic back pain   . Essential hypertension   . Hyperlipidemia   . IGT (impaired glucose tolerance) 2015  . Obesity, unspecified   . Osteoarthritis   . Seasonal allergies     Past Surgical History:  Procedure Laterality Date  . CARDIAC CATHETERIZATION N/A 10/25/2014   Procedure: Left Heart Cath and Coronary Angiography;  Surgeon: Kathleene Hazel, MD;  Location: [facility] INVASIVE CV LAB;  Service: Cardiovascular;  Laterality: N/A;  . COLONOSCOPY N/A 02/09/2016   Procedure: COLONOSCOPY;  Surgeon: West Bali, MD;  Location: [facility];  Service: Endoscopy;  Laterality: N/A;  12:30 PM  . KNEE ARTHROSCOPY Left   . TOTAL ABDOMINAL HYSTERECTOMY  1995 approx   Fibroids  . TOTAL KNEE ARTHROPLASTY Right 2011  . TOTAL KNEE ARTHROPLASTY Left 04/29/2017   Procedure: TOTAL KNEE ARTHROPLASTY;  Surgeon: Vickki Hearing, MD;  Location: [facility];  Service: Orthopedics;  Laterality: Left;    There were no vitals filed for this visit.  Subjective Assessment - 08/05/17 0958    Subjective  Pt states she started having pain in the back of her Lt knee yesterday and still hurts at  8/10 today.  STates she doesn't know if it's the way she slept or what but doesn't rememeber doing anything to it.     Currently in Pain?  Yes    Pain Score  8     Pain Location  Knee    Pain Orientation  Left;Posterior    Pain Descriptors / Indicators  Aching;Sore    Pain Type  Acute pain    Pain Onset  Yesterday    Pain Frequency  Constant                      [facility] Adult PT Treatment/Exercise - 08/05/17 0001      Knee/Hip Exercises: Stretches   Passive Hamstring Stretch  Left;3 reps;30 seconds    Passive Hamstring Stretch Limitations  12" step    Knee: Self-Stretch to increase Flexion  Left;5 reps;10 seconds    Knee: Self-Stretch Limitations  knee drives on 12in step 10x10" for flexion    Gastroc Stretch  Both;3 reps;30 seconds    Gastroc Stretch Limitations  slant board      Knee/Hip Exercises: Standing   Heel Raises  20 reps;Limitations    Heel Raises Limitations  toeraises 20 reps    Knee Flexion  Left;10 reps    Lateral Step Up  Step Height: 6";Left;Hand Hold: 1;5 reps;2 sets  Forward Step Up  Step Height: 6";10 reps;Left;Hand Hold: 1    Step Down  Step Height: 4";Hand Hold: 1;5 reps;2 sets    Stairs  7" 2 RT  B UE reciprocally, 1 HR step to    SLS  LLE only, 5x10" with 1 fingertip assist    Other Standing Knee Exercises  tandem stance 30 " x 2 both with Rt and LT in front       Knee/Hip Exercises: Seated   Sit to Sand  10 reps;without UE support      Knee/Hip Exercises: Supine   Knee Extension Limitations  0    Knee Flexion Limitations  110      Knee/Hip Exercises: Prone   Contract/Relax to Increase Flexion  5x 10" holds    Other Prone Exercises  manual completed prior to contract relax      Manual Therapy   Manual Therapy  Soft tissue mobilization;Passive ROM    Manual therapy comments  Manual complete separate of rest of tx    Soft tissue mobilization  to mobilize tissue medial posterior knee to decrease pain    Passive ROM  contract relax for  flexion in prone               PT Short Term Goals - 07/14/17 1520      PT SHORT TERM GOAL #1   Title  Patient will be independent with HEP to demonstrate improved independence with exercise and advance functional strengthening.    Time  2    Period  Weeks    Status  Achieved      PT SHORT TERM GOAL #2   Title  Patient will have improve AROM into flexion for left knee by 8 degrees to demonstrate significant change and improvement in joint mobility for improved functional mobility wiht gait and stairs.    Baseline  07/14/17 - 0*-100*    Time  3    Period  Weeks    Status  Achieved      PT SHORT TERM GOAL #3   Title  Patient will demonstrate improved MMT for limited muscle groups by 1/2 grade to improve functional strength for improve gait quality and greater muscular endurance with functional activity.    Baseline  07/14/17 - all improvedb y 1/2 grade some by full grade    Time  3    Period  Weeks    Status  Achieved      PT SHORT TERM GOAL #4   Title  Patient will perform TUG wiht LRAD in less than or equal to 12 seconds to reduce fall risk during dynamic gait activitites.    Baseline  07/14/17 - 10.1 seconds with SPC    Time  3    Period  Weeks    Status  Achieved        PT Long Term Goals - 07/14/17 1520      PT LONG TERM GOAL #1   Title  Patient will have improve AROM into flexion for left knee to 110 degrees to have improvement in joint mobility for improved functional mobility wiht gait and stairs.    Baseline  07/14/17 - 0*-100*    Time  6    Period  Weeks    Status  On-going      PT LONG TERM GOAL #2   Title  Patient will demonstrate improved MMT for limited muscle groups by 1 grade to improve functional strength for improve gait quality and greater  muscular endurance with functional activity.    Baseline  07/14/17 - all improved by 1/2 grade some by full grade    Time  6    Period  Weeks    Status  On-going      PT LONG TERM GOAL #3   Title  Patient  will improve 5x sit to stand testing in less than 15 seconds to demosntrate significant improvement in bil LE strength and decrease risk of falling.    Baseline  07/14/17 - 13.5 seconds    Time  6    Period  Weeks    Status  Achieved      PT LONG TERM GOAL #4   Title  Patient will have decreased circumferential edema measurement to be no more than 1 cm greater above and below the patella from right to left knee.    Baseline  07/14/17 - no change    Time  6    Period  Weeks    Status  On-going      PT LONG TERM GOAL #5   Title  Patient will ambulate wtih LRAD at safe community velocity of 1.2 m/s to demosntrate improved/safe functional gait speed to reduce fall risk and improve community mobility.    Baseline  07/14/17 - 0.92 m/s with SPC    Time  3    Period  Weeks    Status  New    Target Date  08/06/17            Plan - 08/05/17 1103    Clinical Impression Statement  Pt able to complete full program today with only 1 rest breaks.  Able to negotiate 7" stairs, however only able to complete reciprocally with B UE assist.  Pt also required verbal cues to bend Lt knee knee rather than circumduct hip when ascending with Lt LE.  Completed manual to painful area posterior medial knee with noted relief following session.  Contract relax completed for flexion with max measurement of 110 degrees today.     Clinical Impairments Affecting Rehab Potential  (+) motivation, (+) positive experience with PT for right TKA, (-) edema    PT Frequency  3x / week    PT Duration  6 weeks    PT Treatment/Interventions  ADLs/Self Care Home Management;Cryotherapy;Electrical Stimulation;DME Instruction;Gait training;Stair training;Functional mobility training;Therapeutic activities;Therapeutic exercise;Balance training;Neuromuscular re-education;Patient/family education;Manual techniques;Scar mobilization;Passive range of motion;Energy conservation;Taping    PT Next Visit Plan  continue to improve LE strength  and ROM of Lt knee.  Next session begin lunges with 4" riser.  Continue to work on Psychologist, counselling.      PT Home Exercise Plan  Eval: heel raises, heel slides with flexion stretch; 07/02/2017: SLR, bridge and standing TKE.    Consulted and Agree with Plan of Care  Patient       Patient will benefit from skilled therapeutic intervention in order to improve the following deficits and impairments:  Decreased balance, Abnormal gait, Decreased endurance, Decreased mobility, Difficulty walking, Hypomobility, Decreased range of motion, Decreased scar mobility, Increased edema, Improper body mechanics, Decreased activity tolerance, Decreased knowledge of use of DME, Decreased strength, Increased fascial restricitons, Impaired flexibility, Postural dysfunction  Visit Diagnosis: Left knee pain, unspecified chronicity  Stiffness of left knee, not elsewhere classified  Muscle weakness (generalized)  Other abnormalities of gait and mobility     Problem List Patient Active Problem List   Diagnosis Date Noted  . S/P total knee arthroplasty, left 04/29/17 05/06/2017  . Anemia  05/06/2017  . Primary osteoarthritis of left knee 04/29/2017  . Primary osteoarthritis of both knees   . Abnormal CT scan, colon 01/18/2016  . Urinary frequency 01/06/2016  . Encounter for examination following treatment at hospital 01/02/2016  . Dyspnea   . Abnormal myocardial perfusion study   . Fatigue 07/18/2014  . Vitamin D deficiency 07/18/2014  . Exercise intolerance 07/18/2014  . Sleep disorder 07/18/2014  . Nonspecific abnormal electrocardiogram (ECG) (EKG) 07/18/2014  . Reduced vision 12/27/2013  . Radicular pain of thoracic region 06/22/2013  . Prediabetes 10/20/2012  . Metabolic syndrome X 10/20/2012  . BACK PAIN WITH RADICULOPATHY 09/20/2008  . Hyperlipidemia LDL goal <100 03/29/2008  . Morbid obesity (HCC) 03/29/2008  . Essential hypertension 10/07/2007   Lurena Nida,  PTA/CLT [phone]  Lurena Nida 08/05/2017, 11:11 AM  [place] [facility] [address] [place], [place], [address] Phone: [phone]   Fax:  [phone]  Name: Patricia Fuentes MRN: [id number] Date of Birth: May 11, 1946

## 2017-08-06 ENCOUNTER — Ambulatory Visit: Payer: Self-pay | Admitting: Family Medicine

## 2017-08-07 ENCOUNTER — Other Ambulatory Visit: Payer: Self-pay

## 2017-08-07 ENCOUNTER — Ambulatory Visit (HOSPITAL_COMMUNITY): Payer: Medicare HMO

## 2017-08-07 ENCOUNTER — Encounter (HOSPITAL_COMMUNITY): Payer: Self-pay

## 2017-08-07 ENCOUNTER — Ambulatory Visit (HOSPITAL_COMMUNITY)
Admission: RE | Admit: 2017-08-07 | Discharge: 2017-08-07 | Disposition: A | Discharge status: Home / Self Care | Payer: Medicare HMO | Source: Ambulatory Visit | Attending: Family Medicine | Admitting: Family Medicine

## 2017-08-07 DIAGNOSIS — R2689 Other abnormalities of gait and mobility: Secondary | ICD-10-CM

## 2017-08-07 DIAGNOSIS — M6281 Muscle weakness (generalized): Secondary | ICD-10-CM | POA: Diagnosis not present

## 2017-08-07 DIAGNOSIS — Z1231 Encounter for screening mammogram for malignant neoplasm of breast: Secondary | ICD-10-CM | POA: Diagnosis not present

## 2017-08-07 DIAGNOSIS — M25562 Pain in left knee: Secondary | ICD-10-CM | POA: Diagnosis not present

## 2017-08-07 DIAGNOSIS — M25662 Stiffness of left knee, not elsewhere classified: Secondary | ICD-10-CM

## 2017-08-07 NOTE — Therapy (Signed)
[place], [place], [address] Phone: [phone]   Fax:  [phone]  Physical Therapy Treatment/Discharge Summary  Patient Details  Name: Patricia Fuentes MRN: [id number] Date of Birth: 05/08/1946 Referring Provider: Arther Abbott   Encounter Date: 08/07/2017   PHYSICAL THERAPY DISCHARGE SUMMARY  Visits from Start of Care: 14  Current functional level related to goals / functional outcomes: Re-assessment performed today and patient has met 4/4 short term goals and 2/5 long term goals. She has demonstrated significant improvement in left knee AROM and currently has 0*-108*. Her gait velocity has improved significantly from 0.52 m/s at evaluation to 1.17 m/s indicating decrease fall risk and placing her in a community ambulatory category. Her FOTO score has improved significantly from 58% limited to feeling 36% limited with functional mobility. Her greatest remaining limitation is left LE edema and has she been educated on importance of wearing compression garments and elevation/ice to reduce swelling. She has reported feeling confident she can continue progressing independently and has been educated on the benefits of participating in a recreational exercise program. She will be discharged following this session.    Remaining deficits: Left LE edema around knee joint.    Education / Equipment: Educated on progress towards goals and continuing to manage edema with elevation, ice and compression garment, Educated on proper care for compression garment with hand washing daily and air drying. Patient was educated that drying the garmen tin the dryer with heat will destroy to elasticity of the fiber in teh stocking. Edcuated patient on progression to [employer] program at [employer] as her insurance may cover programs like the silver sneakers. Also educated on free exercise calssess offered at local senior center in her town.  Encouraged pateitn to continue using teh [place] while ambulating in the community   Plan: Patient agrees to discharge.  Patient goals were partially met. Patient is being discharged due to meeting the stated rehab goals.  ?????       PT End of Session - 08/07/17 1514    Visit Number  14    Number of Visits  19    Date for PT Re-Evaluation  08/08/17    Authorization Type  [employer]    Authorization Time Period  06/25/17- 08/08/17    PT Start Time  1432    PT Stop Time  1505 discharging    PT Time Calculation (min)  33 min    Activity Tolerance  Patient tolerated treatment well    Behavior During Therapy  WFL for tasks assessed/performed       Past Medical History:  Diagnosis Date  . Bronchitis   . Chronic back pain   . Essential hypertension   . Hyperlipidemia   . IGT (impaired glucose tolerance) 2015  . Obesity, unspecified   . Osteoarthritis   . Seasonal allergies     Past Surgical History:  Procedure Laterality Date  . CARDIAC CATHETERIZATION N/A 10/25/2014   Procedure: Left Heart Cath and Coronary Angiography;  Surgeon: Burnell Blanks, MD;  Location: [facility];  Service: Cardiovascular;  Laterality: N/A;  . COLONOSCOPY N/A 02/09/2016   Procedure: COLONOSCOPY;  Surgeon: Danie Binder, MD;  Location: [facility];  Service: Endoscopy;  Laterality: N/A;  12:30 PM  . KNEE ARTHROSCOPY Left   . TOTAL ABDOMINAL HYSTERECTOMY  1995 approx   Fibroids  . TOTAL KNEE ARTHROPLASTY Right 2011  . TOTAL KNEE ARTHROPLASTY Left 04/29/2017   Procedure:  TOTAL KNEE ARTHROPLASTY;  Surgeon: Carole Civil, MD;  Location: [facility];  Service: Orthopedics;  Laterality: Left;    There were no vitals filed for this visit.  Subjective Assessment - 08/07/17 1716    Subjective  Patient states she is doing well today and is still doing her HEP every day. She states the back of her knee bothers her when she straightens it fully and she feels a tight sensation. She denies pain  today.    Pertinent History  R TKA 2011, L TKA 2018    Currently in Pain?  No/denies         [facility] PT Assessment - 08/07/17 0001      Assessment   Medical Diagnosis  Left TKA    Referring Provider  Arther Abbott    Onset Date/Surgical Date  04/29/17    Next MD Visit  April    Prior Therapy  for R TKA      Observation/Other Assessments   Focus on Therapeutic Outcomes (FOTO)   36% limited was 58% limited      Circumferential Edema   Circumferential - Left   58.75cm suprapatellar; 50.25 cm infrapatellar      AROM   Left Knee Extension  0    Left Knee Flexion  108      Strength   Right Hip Flexion  4+/5    Right Hip Extension  3+/5    Right Hip ABduction  4-/5    Left Hip Flexion  4+/5    Left Hip Extension  3+/5    Left Hip ABduction  4/5    Right Knee Flexion  5/5    Right Knee Extension  5/5    Left Knee Flexion  5/5    Left Knee Extension  5/5    Right Ankle Dorsiflexion  5/5    Left Ankle Dorsiflexion  5/5      Ambulation/Gait   Ambulation/Gait  Yes    Ambulation/Gait Assistance  6: Modified independent (Device/Increase time)    Ambulation Distance (Feet)  660 Feet    Assistive device  Straight cane    Gait velocity  1.17 m/s         PT Education - 08/07/17 1511    Education provided  Yes    Education Details  Educated on progress towards goals and continuing to manage edema with elevation, ice and compression garment, Educated on proper care for compression garment with hand washing daily and air drying. Patient was educated that drying the garmen tin the dryer with heat will destroy to elasticity of the fiber in teh stocking. Edcuated patient on progression to [employer] program at [employer] as her insurance may cover programs like the silver sneakers. Also educated on free exercise calssess offered at local senior center in her town. Encouraged pateitn to continue using teh [place] while ambulating in the community.     Person(s) Educated  Patient    Methods   Explanation;Handout    Comprehension  Verbalized understanding       PT Short Term Goals - 08/07/17 1436      PT SHORT TERM GOAL #1   Title  Patient will be independent with HEP to demonstrate improved independence with exercise and advance functional strengthening.    Time  2    Period  Weeks    Status  Achieved      PT SHORT TERM GOAL #2   Title  Patient will have improve AROM into flexion for left  knee by 8 degrees to demonstrate significant change and improvement in joint mobility for improved functional mobility wiht gait and stairs.    Baseline  07/14/17 - 0*-100*    Time  3    Period  Weeks    Status  Achieved      PT SHORT TERM GOAL #3   Title  Patient will demonstrate improved MMT for limited muscle groups by 1/2 grade to improve functional strength for improve gait quality and greater muscular endurance with functional activity.    Baseline  07/14/17 - all improvedb y 1/2 grade some by full grade    Time  3    Period  Weeks    Status  Achieved      PT SHORT TERM GOAL #4   Title  Patient will perform TUG wiht LRAD in less than or equal to 12 seconds to reduce fall risk during dynamic gait activitites.    Baseline  07/14/17 - 10.1 seconds with SPC    Time  3    Period  Weeks    Status  Achieved        PT Long Term Goals - 08/07/17 1436      PT LONG TERM GOAL #1   Title  Patient will have improve AROM into flexion for left knee to 110 degrees to have improvement in joint mobility for improved functional mobility wiht gait and stairs.    Baseline  08/07/17 - 0*-108*    Time  6    Period  Weeks    Status  On-going      PT LONG TERM GOAL #2   Title  Patient will demonstrate improved MMT for limited muscle groups by 1 grade to improve functional strength for improve gait quality and greater muscular endurance with functional activity.    Baseline  08/07/17 - improved by full grade    Time  6    Period  Weeks    Status  Achieved      PT LONG TERM GOAL #3   Title   Patient will improve 5x sit to stand testing in less than 15 seconds to demosntrate significant improvement in bil LE strength and decrease risk of falling.    Baseline  07/14/17 - 13.5 seconds    Time  6    Period  Weeks    Status  Achieved      PT LONG TERM GOAL #4   Title  Patient will have decreased circumferential edema measurement to be no more than 1 cm greater above and below the patella from right to left knee.    Baseline  07/14/17 - no change    Time  6    Period  Weeks    Status  On-going      PT LONG TERM GOAL #5   Title  Patient will ambulate wtih LRAD at safe community velocity of 1.2 m/s to demosntrate improved/safe functional gait speed to reduce fall risk and improve community mobility.    Baseline  08/07/17 - 1.17 m/s with SPC during 3 MWT    Time  3    Period  Weeks    Status  On-going        Plan - 08/07/17 1515    Clinical Impression Statement  Re-assessment performed today and patient has met 4/4 short term goals and 2/5 long term goals. She has demonstrated significant improvement in left knee AROM and currently has 0*-108*. Her gait velocity has improved significantly from 0.52 m/s  at evaluation to 1.17 m/s indicating decrease fall risk and placing her in a community ambulatory category. Her FOTO score has improved significantly from 56% limited to feeling 36% limited with functional mobility. Her greatest remaining limitation is left LE edema and has she been educated on importance of wearing compression garments and elevation/ice to reduce swelling. She has reported feeling confident she can continue progressing independently and has been educated on the benefits of participating in a recreational exercise program. She will be discharged following this session.    Clinical Impairments Affecting Rehab Potential  (+) motivation, (+) positive experience with PT for right TKA, (-) edema    PT Frequency  3x / week    PT Duration  6 weeks    PT Treatment/Interventions   ADLs/Self Care Home Management;Cryotherapy;Electrical Stimulation;DME Instruction;Gait training;Stair training;Functional mobility training;Therapeutic activities;Therapeutic exercise;Balance training;Neuromuscular re-education;Patient/family education;Manual techniques;Scar mobilization;Passive range of motion;Energy conservation;Taping    PT Next Visit Plan  discharging patient this session    PT Home Exercise Plan  Eval: heel raises, heel slides with flexion stretch; 07/02/2017: SLR, bridge and standing TKE.    Consulted and Agree with Plan of Care  Patient       Patient will benefit from skilled therapeutic intervention in order to improve the following deficits and impairments:  Decreased balance, Abnormal gait, Decreased endurance, Decreased mobility, Difficulty walking, Hypomobility, Decreased range of motion, Decreased scar mobility, Increased edema, Improper body mechanics, Decreased activity tolerance, Decreased knowledge of use of DME, Decreased strength, Increased fascial restricitons, Impaired flexibility, Postural dysfunction  Visit Diagnosis: Left knee pain, unspecified chronicity  Stiffness of left knee, not elsewhere classified  Muscle weakness (generalized)  Other abnormalities of gait and mobility     Problem List Patient Active Problem List   Diagnosis Date Noted  . S/P total knee arthroplasty, left 04/29/17 05/06/2017  . Anemia 05/06/2017  . Primary osteoarthritis of left knee 04/29/2017  . Primary osteoarthritis of both knees   . Abnormal CT scan, colon 01/18/2016  . Urinary frequency 01/06/2016  . Encounter for examination following treatment at hospital 01/02/2016  . Dyspnea   . Abnormal myocardial perfusion study   . Fatigue 07/18/2014  . Vitamin D deficiency 07/18/2014  . Exercise intolerance 07/18/2014  . Sleep disorder 07/18/2014  . Nonspecific abnormal electrocardiogram (ECG) (EKG) 07/18/2014  . Reduced vision 12/27/2013  . Radicular pain of thoracic  region 06/22/2013  . Prediabetes 10/20/2012  . Metabolic syndrome X 80/16/5537  . BACK PAIN WITH RADICULOPATHY 09/20/2008  . Hyperlipidemia LDL goal <100 03/29/2008  . Morbid obesity ([place]) 03/29/2008  . Essential hypertension 10/07/2007   Kipp Brood, PT, DPT Physical Therapist with [facility]  08/07/2017 5:37 PM    [place], [place], [address] Phone: [phone]   Fax:  [phone]  Name: Patricia Fuentes MRN: [id number] Date of Birth: [birth year]

## 2017-08-07 NOTE — Patient Instructions (Signed)
Elevate your legs on the arm rest of your couch after performing your HEP and after being more active (like shopping at the food store).   Consider participating in a local gym routine to improve strength and activity tolerance.

## 2017-08-11 ENCOUNTER — Encounter (HOSPITAL_COMMUNITY): Payer: Self-pay

## 2017-08-13 ENCOUNTER — Encounter (HOSPITAL_COMMUNITY): Payer: Self-pay | Admitting: Physical Therapy

## 2017-08-15 ENCOUNTER — Encounter (HOSPITAL_COMMUNITY): Payer: Self-pay

## 2017-08-18 ENCOUNTER — Encounter (HOSPITAL_COMMUNITY): Payer: Self-pay

## 2017-08-19 IMAGING — DX DG CHEST 2V
2 series · 2 of 2 positions shown · non-contrast
Comparison: 06/05/2015

CLINICAL DATA: Cough with congestion and chest tightness.

EXAM:
CHEST  2 VIEW

[chest pa]
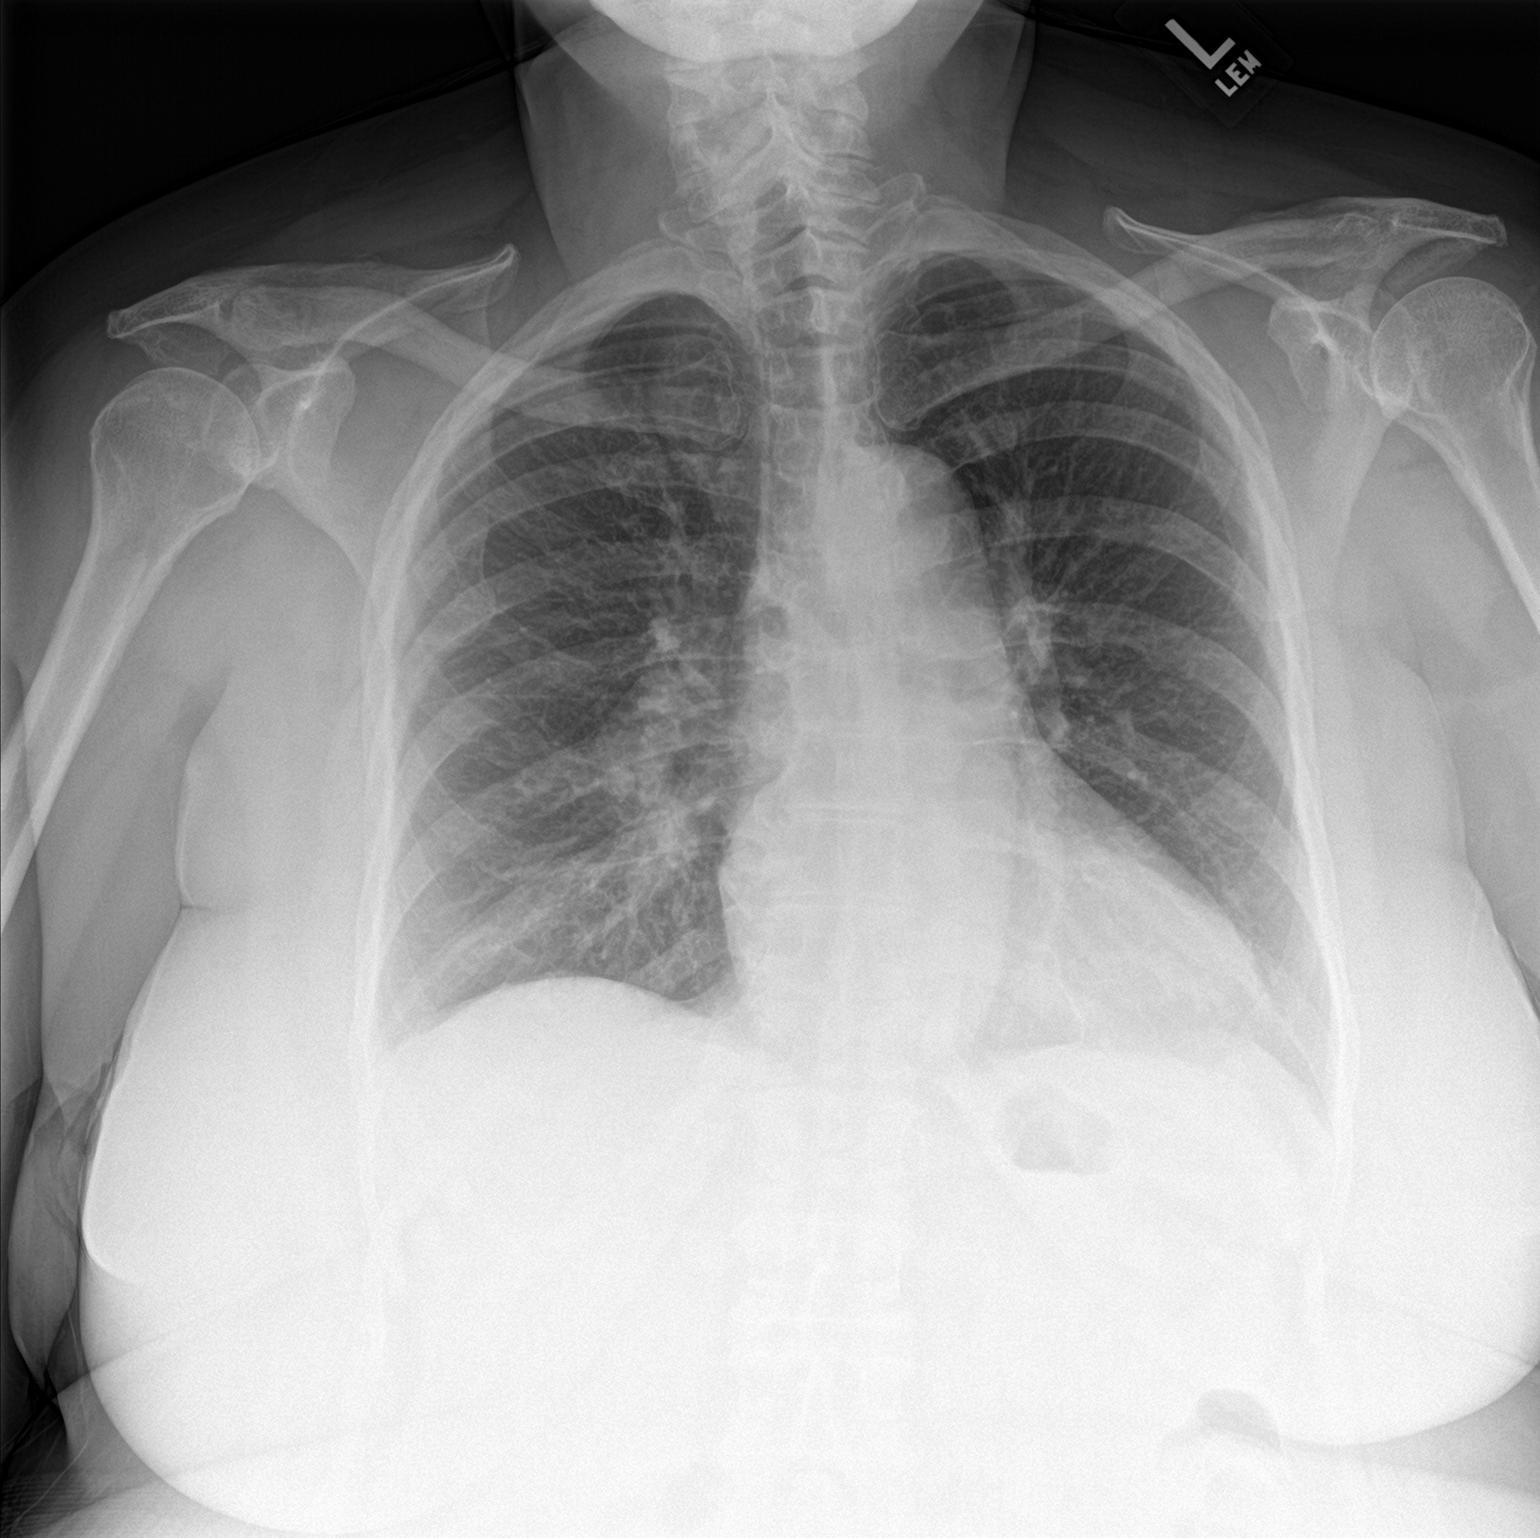

[chest lat]
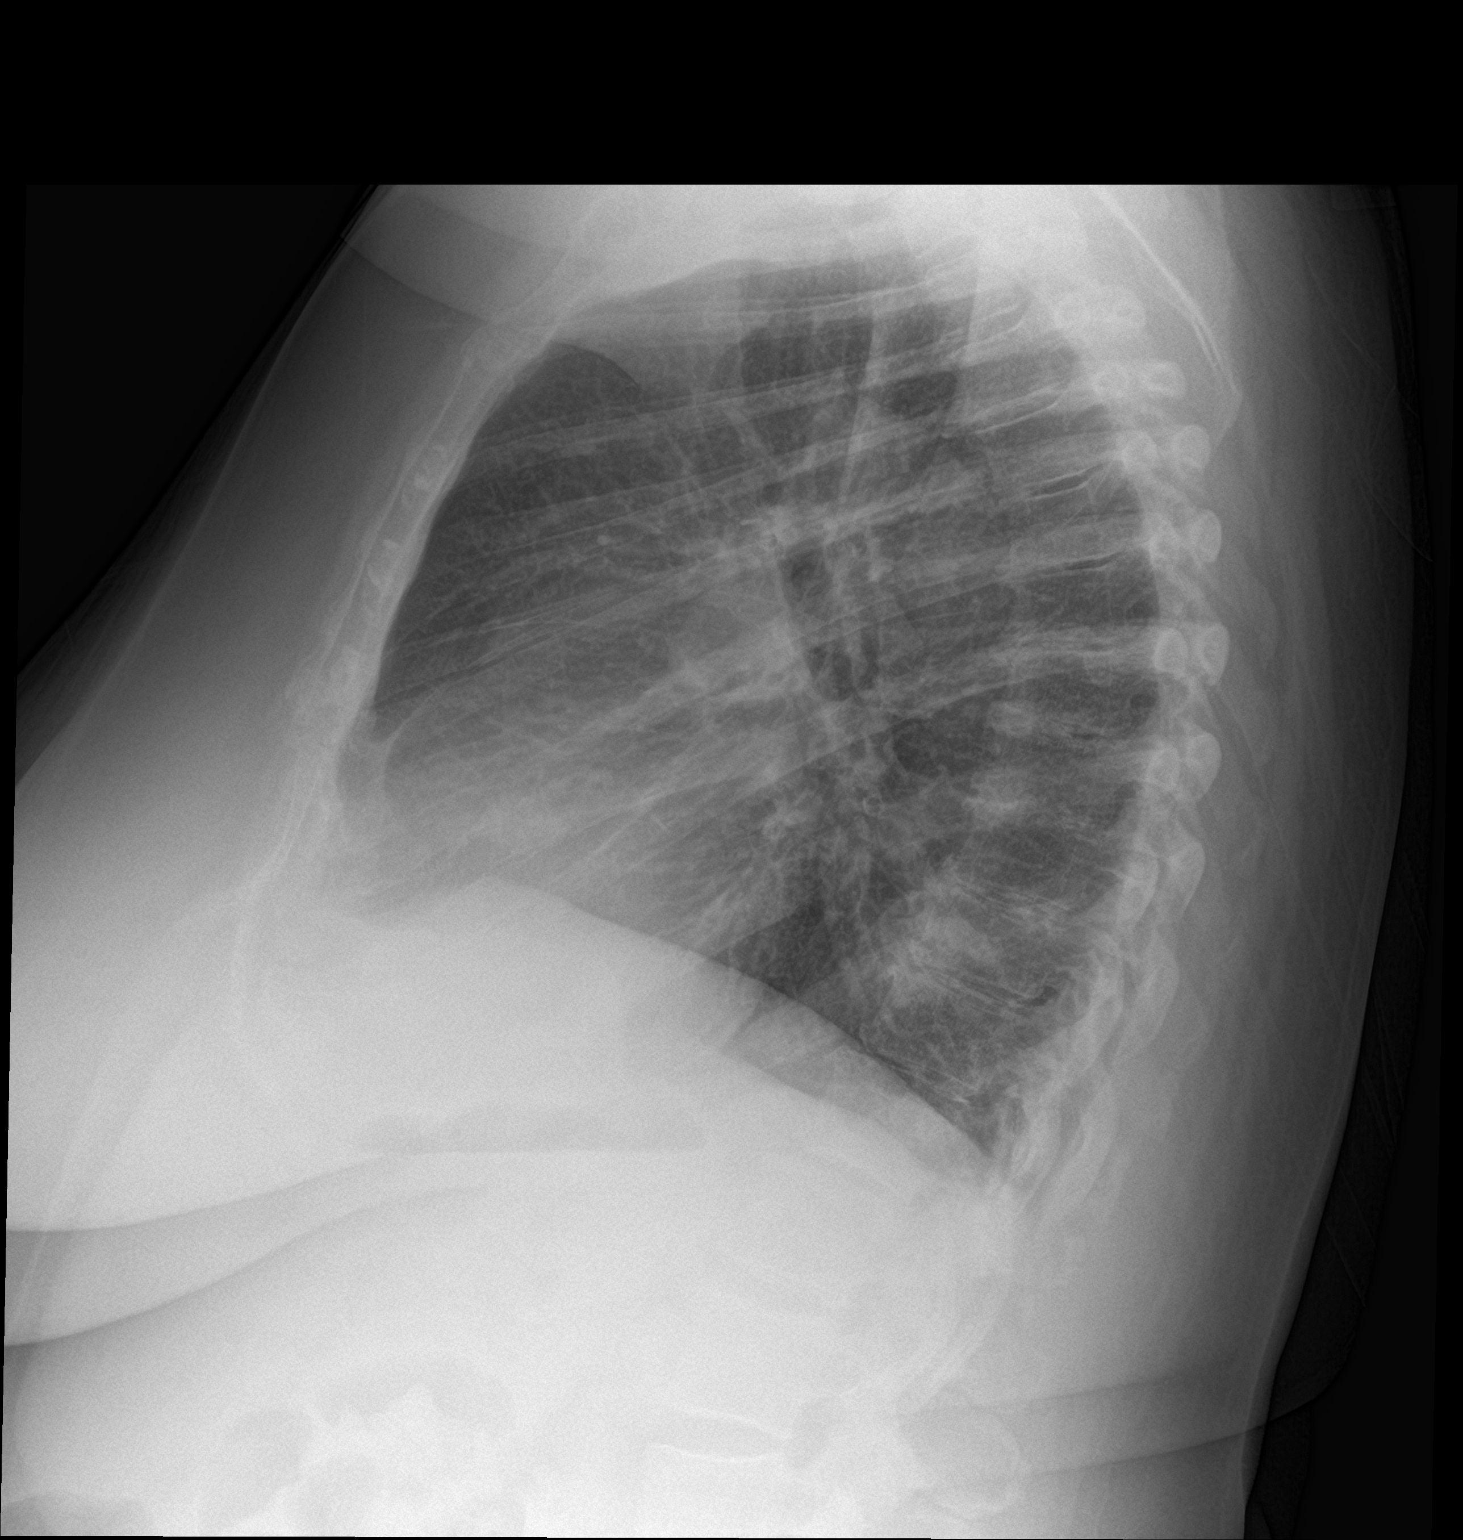

[2 of 2 positions shown; findings below may reference images not displayed]

FINDINGS: The lungs are clear wiithout focal pneumonia, edema, pneumothorax or
pleural effusion. Interstitial markings are diffusely coarsened with
chronic features. Hyperexpansion is consistent with emphysema. The
cardiopericardial silhouette is within normal limits for size. The
visualized bony structures of the thorax are intact.
IMPRESSION: Hyperexpansion with chronic interstitial coarsening.

## 2017-08-20 ENCOUNTER — Encounter (HOSPITAL_COMMUNITY): Payer: Self-pay

## 2017-08-25 ENCOUNTER — Other Ambulatory Visit: Payer: Self-pay | Admitting: Family Medicine

## 2017-09-01 ENCOUNTER — Ambulatory Visit (INDEPENDENT_AMBULATORY_CARE_PROVIDER_SITE_OTHER): Payer: Medicare HMO

## 2017-09-01 VITALS — BP 150/80 | HR 96 | Temp 97.3°F | Resp 16 | Ht 62.0 in | Wt 246.0 lb

## 2017-09-01 DIAGNOSIS — Z0000 Encounter for general adult medical examination without abnormal findings: Secondary | ICD-10-CM

## 2017-09-01 NOTE — Patient Instructions (Signed)
[name] , Thank you for taking time to come for your Medicare Wellness Visit. I appreciate your ongoing commitment to your health goals. Please review the following plan we discussed and let me know if I can assist you in the future.   Screening recommendations/referrals: Colonoscopy: Due 2027 Mammogram: Due 07/2018 Bone Density: Due now- discuss your need for this with Dr. Lodema HongSimpson Recommended yearly ophthalmology/optometry visit for glaucoma screening and checkup Recommended yearly dental visit for hygiene and checkup  Vaccinations: Influenza vaccine: Due fall 2019 Pneumococcal vaccine: Due 2020 Tdap vaccine: You are due for this. Please discuss with Dr. Lodema HongSimpson Shingles vaccine: You are due for this. Please call your insurance for coverage information.    Advanced directives: Discussed today in office.  Conditions/risks identified: None  Next appointment: 01/21/18   Preventive Care 65 Years and Older, Female Preventive care refers to lifestyle choices and visits with your health care provider that can promote health and wellness. What does preventive care include?  A yearly physical exam. This is also called an annual well check.  Dental exams once or twice a year.  Routine eye exams. Ask your health care provider how often you should have your eyes checked.  Personal lifestyle choices, including:  Daily care of your teeth and gums.  Regular physical activity.  Eating a healthy diet.  Avoiding tobacco and drug use.  Limiting alcohol use.  Practicing safe sex.  Taking low-dose aspirin every day.  Taking vitamin and mineral supplements as recommended by your health care provider. What happens during an annual well check? The services and screenings done by your health care provider during your annual well check will depend on your age, overall health, lifestyle risk factors, and family history of disease. Counseling  Your health care provider may ask you  questions about your:  Alcohol use.  Tobacco use.  Drug use.  Emotional well-being.  Home and relationship well-being.  Sexual activity.  Eating habits.  History of falls.  Memory and ability to understand (cognition).  Work and work Astronomerenvironment.  Reproductive health. Screening  You may have the following tests or measurements:  Height, weight, and BMI.  Blood pressure.  Lipid and cholesterol levels. These may be checked every 5 years, or more frequently if you are over 70 years old.  Skin check.  Lung cancer screening. You may have this screening every year starting at age 70 if you have a 30-pack-year history of smoking and currently smoke or have quit within the past 15 years.  Fecal occult blood test (FOBT) of the stool. You may have this test every year starting at age 70.  Flexible sigmoidoscopy or colonoscopy. You may have a sigmoidoscopy every 5 years or a colonoscopy every 10 years starting at age 70.  Hepatitis C blood test.  Hepatitis B blood test.  Sexually transmitted disease (STD) testing.  Diabetes screening. This is done by checking your blood sugar (glucose) after you have not eaten for a while (fasting). You may have this done every 1-3 years.  Bone density scan. This is done to screen for osteoporosis. You may have this done starting at age 70.  Mammogram. This may be done every 1-2 years. Talk to your health care provider about how often you should have regular mammograms. Talk with your health care provider about your test results, treatment options, and if necessary, the need for more tests. Vaccines  Your health care provider may recommend certain vaccines, such as:  Influenza vaccine. This is recommended  every year.  Tetanus, diphtheria, and acellular pertussis (Tdap, Td) vaccine. You may need a Td booster every 10 years.  Zoster vaccine. You may need this after age 70.  Pneumococcal 13-valent conjugate (PCV13) vaccine. One dose is  recommended after age 70.  Pneumococcal polysaccharide (PPSV23) vaccine. One dose is recommended after age 70. Talk to your health care provider about which screenings and vaccines you need and how often you need them. This information is not intended to replace advice given to you by your health care provider. Make sure you discuss any questions you have with your health care provider. Document Released: 06/09/2015 Document Revised: 01/31/2016 Document Reviewed: 03/14/2015 Elsevier Interactive Patient Education  2017 [place] Prevention in the Home Falls can cause injuries. They can happen to people of all ages. There are many things you can do to make your home safe and to help prevent falls. What can I do on the outside of my home?  Regularly fix the edges of walkways and driveways and fix any cracks.  Remove anything that might make you trip as you walk through a door, such as a raised step or threshold.  Trim any bushes or trees on the path to your home.  Use bright outdoor lighting.  Clear any walking paths of anything that might make someone trip, such as rocks or tools.  Regularly check to see if handrails are loose or broken. Make sure that both sides of any steps have handrails.  Any raised decks and porches should have guardrails on the edges.  Have any leaves, snow, or ice cleared regularly.  Use sand or salt on walking paths during winter.  Clean up any spills in your garage right away. This includes oil or grease spills. What can I do in the bathroom?  Use night lights.  Install grab bars by the toilet and in the tub and shower. Do not use towel bars as grab bars.  Use non-skid mats or decals in the tub or shower.  If you need to sit down in the shower, use a plastic, non-slip stool.  Keep the floor dry. Clean up any water that spills on the floor as soon as it happens.  Remove soap buildup in the tub or shower regularly.  Attach bath mats  securely with double-sided non-slip rug tape.  Do not have throw rugs and other things on the floor that can make you trip. What can I do in the bedroom?  Use night lights.  Make sure that you have a light by your bed that is easy to reach.  Do not use any sheets or blankets that are too big for your bed. They should not hang down onto the floor.  Have a firm chair that has side arms. You can use this for support while you get dressed.  Do not have throw rugs and other things on the floor that can make you trip. What can I do in the kitchen?  Clean up any spills right away.  Avoid walking on wet floors.  Keep items that you use a lot in easy-to-reach places.  If you need to reach something above you, use a strong step stool that has a grab bar.  Keep electrical cords out of the way.  Do not use floor polish or wax that makes floors slippery. If you must use wax, use non-skid floor wax.  Do not have throw rugs and other things on the floor that can make you trip. What  can I do with my stairs?  Do not leave any items on the stairs.  Make sure that there are handrails on both sides of the stairs and use them. Fix handrails that are broken or loose. Make sure that handrails are as long as the stairways.  Check any carpeting to make sure that it is firmly attached to the stairs. Fix any carpet that is loose or worn.  Avoid having throw rugs at the top or bottom of the stairs. If you do have throw rugs, attach them to the floor with carpet tape.  Make sure that you have a light switch at the top of the stairs and the bottom of the stairs. If you do not have them, ask someone to add them for you. What else can I do to help prevent falls?  Wear shoes that:  Do not have high heels.  Have rubber bottoms.  Are comfortable and fit you well.  Are closed at the toe. Do not wear sandals.  If you use a stepladder:  Make sure that it is fully opened. Do not climb a closed  stepladder.  Make sure that both sides of the stepladder are locked into place.  Ask someone to hold it for you, if possible.  Clearly mark and make sure that you can see:  Any grab bars or handrails.  First and last steps.  Where the edge of each step is.  Use tools that help you move around (mobility aids) if they are needed. These include:  Canes.  Walkers.  Scooters.  Crutches.  Turn on the lights when you go into a dark area. Replace any light bulbs as soon as they burn out.  Set up your furniture so you have a clear path. Avoid moving your furniture around.  If any of your floors are uneven, fix them.  If there are any pets around you, be aware of where they are.  Review your medicines with your doctor. Some medicines can make you feel dizzy. This can increase your chance of falling. Ask your doctor what other things that you can do to help prevent falls. This information is not intended to replace advice given to you by your health care provider. Make sure you discuss any questions you have with your health care provider. Document Released: 03/09/2009 Document Revised: 10/19/2015 Document Reviewed: 06/17/2014 Elsevier Interactive Patient Education  2017 [place] for Adults  A healthy lifestyle and preventive care can promote health and wellness. Preventive health guidelines for adults include the following key practices.  . A routine yearly physical is a good way to check with your health care provider about your health and preventive screening. It is a chance to share any concerns and updates on your health and to receive a thorough exam.  . Visit your dentist for a routine exam and preventive care every 6 months. Brush your teeth twice a day and floss once a day. Good oral hygiene prevents tooth decay and gum disease.  . The frequency of eye exams is based on your age, health, family medical history, use  of contact lenses, and other  factors. Follow your health care provider's ecommendations for frequency of eye exams.  . Eat a healthy diet. Foods like vegetables, fruits, whole grains, low-fat dairy products, and lean protein foods contain the nutrients you need without too many calories. Decrease your intake of foods high in solid fats, added sugars, and salt. Eat the right amount of calories for you.  Get information about a proper diet from your health care provider, if necessary.  . Regular physical exercise is one of the most important things you can do for your health. Most adults should get at least 150 minutes of moderate-intensity exercise (any activity that increases your heart rate and causes you to sweat) each week. In addition, most adults need muscle-strengthening exercises on 2 or more days a week.  Silver Sneakers may be a benefit available to you. To determine eligibility, you may visit the website: www.silversneakers.com or contact program at 430 433 1019 Mon-Fri between 8AM-8PM.   . Maintain a healthy weight. The body mass index (BMI) is a screening tool to identify possible weight problems. It provides an estimate of body fat based on height and weight. Your health care provider can find your BMI and can help you achieve or maintain a healthy weight.   For adults 20 years and older: ? A BMI below 18.5 is considered underweight. ? A BMI of 18.5 to 24.9 is normal. ? A BMI of 25 to 29.9 is considered overweight. ? A BMI of 30 and above is considered obese.   . Maintain normal blood lipids and cholesterol levels by exercising and minimizing your intake of saturated fat. Eat a balanced diet with plenty of fruit and vegetables. Blood tests for lipids and cholesterol should begin at age 62 and be repeated every 5 years. If your lipid or cholesterol levels are high, you are over 50, or you are at high risk for heart disease, you may need your cholesterol levels checked more frequently. Ongoing high lipid and  cholesterol levels should be treated with medicines if diet and exercise are not working.  . If you smoke, find out from your health care provider how to quit. If you do not use tobacco, please do not start.  . If you choose to drink alcohol, please do not consume more than 2 drinks per day. One drink is considered to be 12 ounces (355 mL) of beer, 5 ounces (148 mL) of wine, or 1.5 ounces (44 mL) of liquor.  . If you are 88-68 years old, ask your health care provider if you should take aspirin to prevent strokes.  . Use sunscreen. Apply sunscreen liberally and repeatedly throughout the day. You should seek shade when your shadow is shorter than you. Protect yourself by wearing long sleeves, pants, a wide-brimmed hat, and sunglasses year round, whenever you are outdoors.  . Once a month, do a whole body skin exam, using a mirror to look at the skin on your back. Tell your health care provider of new moles, moles that have irregular borders, moles that are larger than a pencil eraser, or moles that have changed in shape or color.

## 2017-09-01 NOTE — Progress Notes (Signed)
Subjective:   TAKELA VARDEN is a 72 y.o. female who presents for Medicare Annual (Subsequent) preventive examination.  Review of Systems:   Cardiac Risk Factors include: advanced age (>83men, >58 women);obesity (BMI >30kg/m2);sedentary lifestyle     Objective:     Vitals: BP (!) 150/80 (BP Location: Left Arm, Patient Position: Sitting, Cuff Size: Normal)   Pulse 96   Temp (!) 97.3 F (36.3 C) (Temporal)   Resp 16   Ht 5\' 2"  (1.575 m)   Wt 246 lb (111.6 kg)   SpO2 98%   BMI 44.99 kg/m   Body mass index is 44.99 kg/m.  Advanced Directives 09/01/2017 07/23/2017 07/09/2017 07/05/2017 07/02/2017 06/27/2017 06/25/2017  Does Patient Have a Medical Advance Directive? No No No No No No No  Type of Advance Directive - - - - - - -  Does patient want to make changes to medical advance directive? - - - - - - -  Would patient like information on creating a medical advance directive? Yes (MAU/Ambulatory/Procedural Areas - Information given) Yes (MAU/Ambulatory/Procedural Areas - Information given) - - Yes (MAU/Ambulatory/Procedural Areas - Information given) Yes (MAU/Ambulatory/Procedural Areas - Information given) Yes (MAU/Ambulatory/Procedural Areas - Information given)    Tobacco Social History   Tobacco Use  Smoking Status Never Smoker  Smokeless Tobacco Never Used     Counseling given: Yes   Clinical Intake:     Pain : No/denies pain Pain Score: 0-No pain     Nutritional Status: BMI > 30  Obese Diabetes: No  How often do you need to have someone help you when you read instructions, pamphlets, or other written materials from your doctor or pharmacy?: 1 - Never  Interpreter Needed?: No     Past Medical History:  Diagnosis Date  . Bronchitis   . Chronic back pain   . Essential hypertension   . Hyperlipidemia   . IGT (impaired glucose tolerance) 2015  . Obesity, unspecified   . Osteoarthritis   . Seasonal allergies    Past Surgical History:  Procedure Laterality  Date  . CARDIAC CATHETERIZATION N/A 10/25/2014   Procedure: Left Heart Cath and Coronary Angiography;  Surgeon: Kathleene Hazel, MD;  [facility] INVASIVE CV LAB;  Service: Cardiovascular;  Laterality: N/A;  . COLONOSCOPY N/A 02/09/2016   Procedure: COLONOSCOPY;  Surgeon: West Bali, MD;  Location: [facility] ENDO SUITE;  Service: Endoscopy;  Laterality: N/A;  12:30 PM  . KNEE ARTHROSCOPY Left   . TOTAL ABDOMINAL HYSTERECTOMY  1995 approx   Fibroids  . TOTAL KNEE ARTHROPLASTY Right 2011  . TOTAL KNEE ARTHROPLASTY Left 04/29/2017   Procedure: TOTAL KNEE ARTHROPLASTY;  Surgeon: Vickki Hearing, MD;  Location: [facility] ORS;  Service: Orthopedics;  Laterality: Left;   Family History  Problem Relation Age of Onset  . Hypertension Mother   . Stroke Mother 57       Deceased due CVa at 85  . Diabetes Father   . Cancer Father 60       Unknown, somewhere in stomach  . Hypertension Brother   . Hypertension Brother   . Hypertension Brother   . Cancer Maternal Grandfather        ?type  . Hypertension Son    Social History   Socioeconomic History  . Marital status: Married    Spouse name: Not on file  . Number of children: Not on file  . Years of education: Not on file  . Highest education level: Not on file  Occupational History  . Occupation: retired    Associate Professormployer: UNEMPLOYED  Social Needs  . Financial resource strain: Not hard at all  . Food insecurity:    Worry: Never true    Inability: Never true  . Transportation needs:    Medical: No    Non-medical: No  Tobacco Use  . Smoking status: Never Smoker  . Smokeless tobacco: Never Used  Substance and Sexual Activity  . Alcohol use: No  . Drug use: No  . Sexual activity: Yes    Birth control/protection: Surgical  Lifestyle  . Physical activity:    Days per week: 0 days    Minutes per session: 0 min  . Stress: Not at all  Relationships  . Social connections:    Talks on phone: More than three times a week    Gets together:  More than three times a week    Attends religious service: More than 4 times per year    Active member of club or organization: No    Attends meetings of clubs or organizations: Never    Relationship status: Married  Other Topics Concern  . Not on file  Social History Narrative  . Not on file    Outpatient Encounter Medications as of 09/01/2017  Medication Sig  . amLODipine (NORVASC) 2.5 MG tablet TAKE ONE TABLET BY MOUTH DAILY.  Marland Kitchen. amLODipine (NORVASC) 5 MG tablet TAKE ONE TABLET BY MOUTH DAILY.  Marland Kitchen. aspirin EC 81 MG tablet Take 81 mg by mouth 2 (two) times daily.  . ferrous sulfate (IRON SUPPLEMENT) 325 (65 FE) MG tablet Take 325 mg by mouth 2 (two) times daily with a meal.  . Menthol, Topical Analgesic, (BENGAY EX) Apply 1 application topically daily as needed (shoulder pain).  . naproxen sodium (ALEVE) 220 MG tablet Take 220 mg by mouth daily as needed (for knee pain).  . predniSONE (DELTASONE) 50 MG tablet Take 1 tablet (50 mg total) by mouth daily.  . Vitamin D, Cholecalciferol, 1000 units TABS Take 1,000 Units by mouth daily.  Marland Kitchen. moxifloxacin (AVELOX) 400 MG tablet Take 1 tablet (400 mg total) by mouth daily at 8 pm. (Patient not taking: Reported on 07/22/2017)   No facility-administered encounter medications on file as of 09/01/2017.     Activities of Daily Living In your present state of health, do you have any difficulty performing the following activities: 09/01/2017 04/29/2017  Hearing? N N  Vision? Y N  Difficulty concentrating or making decisions? N N  Walking or climbing stairs? Y Y  Dressing or bathing? N N  Doing errands, shopping? N N  Preparing Food and eating ? N -  Using the Toilet? N -  In the past six months, have you accidently leaked urine? N -  Do you have problems with loss of bowel control? N -  Managing your Medications? N -  Managing your Finances? N -  Housekeeping or managing your Housekeeping? N -  Some recent data might be hidden    Patient Care  Team: Kerri PerchesSimpson, Margaret E, MD as PCP - General Vickki HearingHarrison, Stanley E, MD as Consulting Physician (Orthopedic Surgery) West BaliFields, Sandi L, MD as Consulting Physician (Gastroenterology)    Assessment:   This is a routine wellness examination for Nettie ElmSylvia.  Exercise Activities and Dietary recommendations Current Exercise Habits: The patient has a physically strenous job, but has no regular exercise apart from work., Exercise limited by: orthopedic condition(s)  Goals    . Exercise 3x per week (30 min per  time)     Recommend chair exercises at least 3 times a week for at least 30 minutes at a time as tolerated.       Fall Risk Fall Risk  09/01/2017 02/11/2017 07/18/2016 01/02/2016 07/04/2015  Falls in the past year? No No Yes No No  Number falls in past yr: - - 1 - -  Injury with Fall? - - No - -  Risk for fall due to : - - - - -  Follow up - - Falls evaluation completed;Falls prevention discussed - -  Comment - - reveals that the patient accidentaly stumbled - -   Is the patient's home free of loose throw rugs in walkways, pet beds, electrical cords, etc?   yes      Grab bars in the bathroom? yes      Handrails on the stairs?   yes      Adequate lighting?   yes  Depression Screen PHQ 2/9 Scores 09/01/2017 02/11/2017 07/18/2016 07/18/2014  PHQ - 2 Score 0 0 0 0     Cognitive Function     6CIT Screen 07/18/2016  What Year? 0 points  What month? 0 points  What time? 0 points  Count back from 20 0 points  Months in reverse 0 points  Repeat phrase 0 points  Total Score 0    Immunization History  Administered Date(s) Administered  . Influenza,inj,Quad PF,6+ Mos 06/22/2013, 07/18/2014, 07/04/2015, 03/21/2016, 02/11/2017  . Pneumococcal Conjugate-13 12/27/2013  . Pneumococcal Polysaccharide-23 12/15/2012    Qualifies for Shingles Vaccine? Yes, the importance of this was discussed, as well as verifying coverage.  Screening Tests Health Maintenance  Topic Date Due  . DEXA SCAN  08/11/2012   . TETANUS/TDAP  03/11/2018 (Originally [birth year]/1966)  . INFLUENZA VACCINE  12/25/2017  . MAMMOGRAM  08/08/2019  . COLONOSCOPY  02/08/2026  . Hepatitis C Screening  Completed  . PNA vac Low Risk Adult  Completed    Cancer Screenings: Lung: Low Dose CT Chest recommended if Age 93-80 years, 30 pack-year currently smoking OR have quit w/in 15years. Patient does not qualify. Breast:  Up to date on Mammogram? Yes   Up to date of Bone Density/Dexa? Yes Colorectal: Due 2027      Plan:      I have personally reviewed and noted the following in the patient's chart:   . Medical and social history . Use of alcohol, tobacco or illicit drugs  . Current medications and supplements . Functional ability and status . Nutritional status . Physical activity . Advanced directives . List of other physicians . Hospitalizations, surgeries, and ER visits in previous 12 months . Vitals . Screenings to include cognitive, depression, and falls . Referrals and appointments  In addition, I have reviewed and discussed with patient certain preventive protocols, quality metrics, and best practice recommendations. A written personalized care plan for preventive services as well as general preventive health recommendations were provided to patient.     Mack Hook, LPN  06/01/1094

## 2017-10-09 ENCOUNTER — Other Ambulatory Visit: Payer: Self-pay | Admitting: Family Medicine

## 2017-10-21 ENCOUNTER — Encounter: Payer: Self-pay | Admitting: Orthopedic Surgery

## 2017-10-21 ENCOUNTER — Ambulatory Visit: Payer: Medicare HMO | Admitting: Orthopedic Surgery

## 2017-10-21 VITALS — BP 138/75 | HR 88 | Ht 62.0 in | Wt 243.0 lb

## 2017-10-21 DIAGNOSIS — Z96652 Presence of left artificial knee joint: Secondary | ICD-10-CM

## 2017-10-21 NOTE — Progress Notes (Signed)
Progress Note   Patient ID: Patricia Fuentes, female   DOB: [birth year], 72 y.o.   MRN: [id number]  Chief Complaint  Patient presents with  . Follow-up    Recheck on left TKA, DOS 04-29-17.     Medical decision-making No diagnosis found.   PLAN:  The patient will continue with normal activities and follow-up in December for the x-ray at 1 year  The bursitis over the medial portion of the knee is chronic and will be treated with ice the tightness in the back of the knee is not affecting her knee extension in any way and is not of any concern   Chief Complaint  Patient presents with  . Follow-up    Recheck on left TKA, DOS 04-29-17.    72 year old female 5 months after left total knee doing well except for tightness in the back of the knee and some pain over the medial pes bursa  She has resumed almost all normal activity is not having any pain that requires any medication    Review of Systems  Constitutional: Negative for fever.  Skin: Negative.    Current Meds  Medication Sig  . amLODipine (NORVASC) 2.5 MG tablet TAKE ONE TABLET BY MOUTH DAILY.  Marland Kitchen amLODipine (NORVASC) 5 MG tablet TAKE ONE TABLET BY MOUTH DAILY.  Marland Kitchen aspirin EC 81 MG tablet Take 81 mg by mouth 2 (two) times daily.  . ferrous sulfate (IRON SUPPLEMENT) 325 (65 FE) MG tablet Take 325 mg by mouth 2 (two) times daily with a meal.  . Menthol, Topical Analgesic, (BENGAY EX) Apply 1 application topically daily as needed (shoulder pain).  . naproxen sodium (ALEVE) 220 MG tablet Take 220 mg by mouth daily as needed (for knee pain).  . Vitamin D, Cholecalciferol, 1000 units TABS Take 1,000 Units by mouth daily.    Allergies  Allergen Reactions  . Promethazine Hcl Nausea And Vomiting and Other (See Comments)    dizzy     BP 138/75   Pulse 88   Ht  (1.575 m)   Wt 243 lb (110.2 kg)   BMI 44.45 kg/m   Physical Exam  Constitutional:  Moderately obese African-American female normal development grooming  and hygiene alert and oriented x3 mood and affect normal  Musculoskeletal:       Legs:      Fuller Canada, MD 10/21/2017 4:05 PM

## 2017-10-22 ENCOUNTER — Ambulatory Visit: Payer: Self-pay | Admitting: Orthopedic Surgery

## 2018-01-14 ENCOUNTER — Encounter: Payer: Self-pay | Admitting: Family Medicine

## 2018-01-14 ENCOUNTER — Ambulatory Visit (INDEPENDENT_AMBULATORY_CARE_PROVIDER_SITE_OTHER): Payer: Medicare HMO | Admitting: Family Medicine

## 2018-01-14 VITALS — BP 120/78 | HR 77 | Resp 16 | Ht 62.0 in | Wt 247.0 lb

## 2018-01-14 DIAGNOSIS — R5383 Other fatigue: Secondary | ICD-10-CM | POA: Diagnosis not present

## 2018-01-14 DIAGNOSIS — R350 Frequency of micturition: Secondary | ICD-10-CM

## 2018-01-14 DIAGNOSIS — H547 Unspecified visual loss: Secondary | ICD-10-CM | POA: Diagnosis not present

## 2018-01-14 DIAGNOSIS — R351 Nocturia: Secondary | ICD-10-CM | POA: Diagnosis not present

## 2018-01-14 DIAGNOSIS — I10 Essential (primary) hypertension: Secondary | ICD-10-CM

## 2018-01-14 DIAGNOSIS — Z13820 Encounter for screening for osteoporosis: Secondary | ICD-10-CM

## 2018-01-14 DIAGNOSIS — Z111 Encounter for screening for respiratory tuberculosis: Secondary | ICD-10-CM

## 2018-01-14 DIAGNOSIS — E785 Hyperlipidemia, unspecified: Secondary | ICD-10-CM

## 2018-01-14 DIAGNOSIS — R7303 Prediabetes: Secondary | ICD-10-CM

## 2018-01-14 DIAGNOSIS — E8881 Metabolic syndrome: Secondary | ICD-10-CM

## 2018-01-14 NOTE — Progress Notes (Signed)
Laban EmperorSylvia A Foulks     MRN: [id number]      DOB: [birth year]   HPI [name] is here for follow up and re-evaluation of chronic medical conditions, medication management and review of any available recent lab and radiology data.  Preventive health is updated, specifically  Cancer screening and Immunization.   Need sTB skin test placed The PT denies any adverse reactions to current medications since the last visit.  C/o dysuria and frequency at night 4 to 6 times at night  Since April C/o poor vision and rquests referral for eye exam  ROS Denies recent fever or chills. Denies sinus pressure, nasal congestion, ear pain or sore throat. Denies chest congestion, productive cough or wheezing. Denies chest pains, palpitations and leg swelling Denies abdominal pain, nausea, vomiting,diarrhea or constipation.   Denies dysuria, frequency, hesitancy or incontinence. Denies uncontrolled joint pain, swelling and limitation in mobility. Denies headaches, seizures, numbness, or tingling. Denies depression, anxiety or insomnia. Denies skin break down or rash.   PE  BP 120/78   Pulse 77   Resp 16   Ht 5\' 2"  (1.575 m)   Wt 247 lb (112 kg)   SpO2 97%   BMI 45.18 kg/m   Patient alert and oriented and in no cardiopulmonary distress.  HEENT: No facial asymmetry, EOMI,   oropharynx pink and moist.  Neck supple no JVD, no mass.  Chest: Clear to auscultation bilaterally.  CVS: S1, S2 no murmurs, no S3.Regular rate.  ABD: Soft non tender.   Ext: No edema  MS: Adequate ROM spine, shoulders, hips and  Reduced in knees.  Skin: Intact, no ulcerations or rash noted.  Psych: Good eye contact, normal affect. Memory intact not anxious or depressed appearing.  CNS: CN 2-12 intact, power,  normal throughout.no focal deficits noted.   Assessment & Plan  Reduced vision 6 month h/o blurry vision, needs eye exam, not diabetic  Essential hypertension Controlled, no change in medication DASH  diet and commitment to daily physical activity for a minimum of 30 minutes discussed and encouraged, as a part of hypertension management. The importance of attaining a healthy weight is also discussed.  BP/Weight 01/14/2018 10/21/2017 09/01/2017 07/23/2017 07/22/2017 07/09/2017 07/05/2017  Systolic BP 120 138 150 118 140 105 149  Diastolic BP 78 75 80 63 80 92 55  Wt. (Lbs) 247 243 246 248 246 244 244  BMI 45.18 44.45 44.99 45.36 44.99 44.63 44.63       Morbid obesity Deteriorated. Patient re-educated about  the importance of commitment to a  minimum of 150 minutes of exercise per week.  The importance of healthy food choices with portion control discussed. Encouraged to start a food diary, count calories and to consider  joining a support group. Sample diet sheets offered. Goals set by the patient for the next several months.   Weight /BMI 01/14/2018 10/21/2017 09/01/2017  WEIGHT 247 lb 243 lb 246 lb  HEIGHT 5\' 2"  5\' 2"  5\' 2"   BMI 45.18 kg/m2 44.45 kg/m2 44.99 kg/m2      Urinary frequency 5 month h/o nocturia and poor stream, CCUA normal at visit, refer to urology for further evaluatioin  Prediabetes Patient educated about the importance of limiting  Carbohydrate intake , the need to commit to daily physical activity for a minimum of 30 minutes , and to commit weight loss. The fact that changes in all these areas will reduce or eliminate all together the development of diabetes is stressed.   Diabetic Labs  Latest Ref Rng & Units 07/23/2017 07/09/2017 07/05/2017 05/29/2017 05/18/2017  HbA1c <5.7 % of total Hgb - - - - -  Chol <200 mg/dL - - - - -  HDL >16>50 mg/dL - - - - -  Calc LDL mg/dL (calc) - - - - -  Triglycerides <150 mg/dL - - - - -  Creatinine 1.090.50 - 0.99 mg/dL 6.040.72 5.400.70 9.810.63 1.910.97 4.78(G1.69(H)   BP/Weight 01/14/2018 10/21/2017 09/01/2017 07/23/2017 07/22/2017 07/09/2017 07/05/2017  Systolic BP 120 138 150 118 140 105 149  Diastolic BP 78 75 80 63 80 92 55  Wt. (Lbs) 247 243 246 248 246 244 244    BMI 45.18 44.45 44.99 45.36 44.99 44.63 44.63   No flowsheet data found.  Updated lab needed

## 2018-01-14 NOTE — Assessment & Plan Note (Signed)
Controlled, no change in medication DASH diet and commitment to daily physical activity for a minimum of 30 minutes discussed and encouraged, as a part of hypertension management. The importance of attaining a healthy weight is also discussed.  BP/Weight 01/14/2018 10/21/2017 09/01/2017 07/23/2017 07/22/2017 07/09/2017 07/05/2017  Systolic BP 120 138 150 118 140 105 149  Diastolic BP 78 75 80 63 80 92 55  Wt. (Lbs) 247 243 246 248 246 244 244  BMI 45.18 44.45 44.99 45.36 44.99 44.63 44.63

## 2018-01-14 NOTE — Assessment & Plan Note (Signed)
5 month h/o nocturia and poor stream, CCUA normal at visit, refer to urology for further evaluatioin

## 2018-01-14 NOTE — Assessment & Plan Note (Signed)
Deteriorated. Patient re-educated about  the importance of commitment to a  minimum of 150 minutes of exercise per week.  The importance of healthy food choices with portion control discussed. Encouraged to start a food diary, count calories and to consider  joining a support group. Sample diet sheets offered. Goals set by the patient for the next several months.   Weight /BMI 01/14/2018 10/21/2017 09/01/2017  WEIGHT 247 lb 243 lb 246 lb  HEIGHT 5\' 2"  5\' 2"  5\' 2"   BMI 45.18 kg/m2 44.45 kg/m2 44.99 kg/m2

## 2018-01-14 NOTE — Assessment & Plan Note (Signed)
Patient educated about the importance of limiting  Carbohydrate intake , the need to commit to daily physical activity for a minimum of 30 minutes , and to commit weight loss. The fact that changes in all these areas will reduce or eliminate all together the development of diabetes is stressed.   Diabetic Labs Latest Ref Rng & Units 07/23/2017 07/09/2017 07/05/2017 05/29/2017 05/18/2017  HbA1c <5.7 % of total Hgb - - - - -  Chol <200 mg/dL - - - - -  HDL >16>50 mg/dL - - - - -  Calc LDL mg/dL (calc) - - - - -  Triglycerides <150 mg/dL - - - - -  Creatinine 1.090.50 - 0.99 mg/dL 6.040.72 5.400.70 9.810.63 1.910.97 4.78(G1.69(H)   BP/Weight 01/14/2018 10/21/2017 09/01/2017 07/23/2017 07/22/2017 07/09/2017 07/05/2017  Systolic BP 120 138 150 118 140 105 149  Diastolic BP 78 75 80 63 80 92 55  Wt. (Lbs) 247 243 246 248 246 244 244  BMI 45.18 44.45 44.99 45.36 44.99 44.63 44.63   No flowsheet data found.  Updated lab needed

## 2018-01-14 NOTE — Patient Instructions (Addendum)
Annual physical exam with MD in October, and fl;u vaccine call if you need me sooner  TB test placed NEEDS to be read this week Friday  Check re appt for eye exam on Friday, you are referred  Urine is  not infected , you  are referred to urology  CBC, fasting lipid, cmp and EGFR, hBA1C , TSH and vitamin D 1 week before your October visit please , at [facility] please leave tday wiith lab order  Please work on weifght loss for health 

## 2018-01-14 NOTE — Assessment & Plan Note (Signed)
6 month h/o blurry vision, needs eye exam, not diabetic

## 2018-01-15 DIAGNOSIS — R351 Nocturia: Secondary | ICD-10-CM | POA: Diagnosis not present

## 2018-01-15 DIAGNOSIS — E785 Hyperlipidemia, unspecified: Secondary | ICD-10-CM | POA: Diagnosis not present

## 2018-01-15 DIAGNOSIS — H547 Unspecified visual loss: Secondary | ICD-10-CM | POA: Diagnosis not present

## 2018-01-15 DIAGNOSIS — Z111 Encounter for screening for respiratory tuberculosis: Secondary | ICD-10-CM | POA: Diagnosis not present

## 2018-01-15 DIAGNOSIS — R5383 Other fatigue: Secondary | ICD-10-CM | POA: Diagnosis not present

## 2018-01-15 DIAGNOSIS — I10 Essential (primary) hypertension: Secondary | ICD-10-CM | POA: Diagnosis not present

## 2018-01-15 DIAGNOSIS — R7303 Prediabetes: Secondary | ICD-10-CM | POA: Diagnosis not present

## 2018-01-15 DIAGNOSIS — Z13820 Encounter for screening for osteoporosis: Secondary | ICD-10-CM | POA: Diagnosis not present

## 2018-01-16 LAB — TB SKIN TEST
INDURATION: 0 mm
TB SKIN TEST: NEGATIVE

## 2018-01-21 ENCOUNTER — Other Ambulatory Visit: Payer: Self-pay | Admitting: Family Medicine

## 2018-01-21 ENCOUNTER — Ambulatory Visit: Payer: Self-pay | Admitting: Family Medicine

## 2018-01-23 ENCOUNTER — Other Ambulatory Visit: Payer: Self-pay | Admitting: Family Medicine

## 2018-01-23 ENCOUNTER — Telehealth: Payer: Self-pay | Admitting: Family Medicine

## 2018-01-23 NOTE — Telephone Encounter (Signed)
Patient called and ask if you could follow up on the referral to Urology and Opthalmology.  I told her you would not be in the clinic until Thursday 9/5.

## 2018-03-05 ENCOUNTER — Telehealth: Payer: Self-pay

## 2018-03-05 ENCOUNTER — Telehealth: Payer: Self-pay | Admitting: Family Medicine

## 2018-03-05 DIAGNOSIS — I10 Essential (primary) hypertension: Secondary | ICD-10-CM

## 2018-03-05 DIAGNOSIS — R5383 Other fatigue: Secondary | ICD-10-CM

## 2018-03-05 DIAGNOSIS — E559 Vitamin D deficiency, unspecified: Secondary | ICD-10-CM

## 2018-03-05 DIAGNOSIS — E8881 Metabolic syndrome: Secondary | ICD-10-CM

## 2018-03-05 DIAGNOSIS — E785 Hyperlipidemia, unspecified: Secondary | ICD-10-CM

## 2018-03-05 DIAGNOSIS — R7303 Prediabetes: Secondary | ICD-10-CM

## 2018-03-05 NOTE — Telephone Encounter (Signed)
Labs reordered for Patricia Fuentes per patient request

## 2018-03-05 NOTE — Telephone Encounter (Signed)
Patient's labs reordered to be drawn at [facility] per patient request

## 2018-03-05 NOTE — Telephone Encounter (Signed)
Please re order patients labs for [facility] instead of [facility].

## 2018-03-10 ENCOUNTER — Ambulatory Visit: Payer: Medicare HMO | Admitting: Urology

## 2018-03-10 ENCOUNTER — Encounter: Payer: Self-pay | Admitting: Family Medicine

## 2018-03-10 DIAGNOSIS — N3281 Overactive bladder: Secondary | ICD-10-CM | POA: Diagnosis not present

## 2018-03-10 DIAGNOSIS — R351 Nocturia: Secondary | ICD-10-CM

## 2018-03-10 DIAGNOSIS — R350 Frequency of micturition: Secondary | ICD-10-CM

## 2018-03-10 DIAGNOSIS — R3915 Urgency of urination: Secondary | ICD-10-CM

## 2018-04-30 ENCOUNTER — Other Ambulatory Visit (HOSPITAL_COMMUNITY)
Admission: RE | Admit: 2018-04-30 | Discharge: 2018-04-30 | Disposition: A | Discharge status: Home / Self Care | Payer: Medicare HMO | Source: Ambulatory Visit | Attending: Family Medicine | Admitting: Family Medicine

## 2018-04-30 DIAGNOSIS — I10 Essential (primary) hypertension: Secondary | ICD-10-CM | POA: Insufficient documentation

## 2018-04-30 DIAGNOSIS — E785 Hyperlipidemia, unspecified: Secondary | ICD-10-CM | POA: Insufficient documentation

## 2018-04-30 DIAGNOSIS — R7303 Prediabetes: Secondary | ICD-10-CM | POA: Diagnosis not present

## 2018-04-30 DIAGNOSIS — Z13820 Encounter for screening for osteoporosis: Secondary | ICD-10-CM | POA: Insufficient documentation

## 2018-04-30 LAB — COMPREHENSIVE METABOLIC PANEL
ALK PHOS: 64 U/L (ref 38–126)
ALT: 10 U/L (ref 0–44)
AST: 12 U/L — ABNORMAL LOW (ref 15–41)
Albumin: 3.6 g/dL (ref 3.5–5.0)
Anion gap: 7 (ref 5–15)
BUN: 9 mg/dL (ref 8–23)
CALCIUM: 8.7 mg/dL — AB (ref 8.9–10.3)
CO2: 33 mmol/L — ABNORMAL HIGH (ref 22–32)
Chloride: 101 mmol/L (ref 98–111)
Creatinine, Ser: 0.6 mg/dL (ref 0.44–1.00)
GFR calc Af Amer: >60 mL/min (ref >60)
GFR calc non Af Amer: >60 mL/min (ref >60)
Glucose, Bld: 97 mg/dL (ref 70–99)
Potassium: 3.5 mmol/L (ref 3.5–5.1)
Sodium: 141 mmol/L (ref 135–145)
Total Bilirubin: 0.4 mg/dL (ref 0.3–1.2)
Total Protein: 8.1 g/dL (ref 6.5–8.1)

## 2018-04-30 LAB — TSH: TSH: 1.16 u[IU]/mL (ref 0.350–4.500)

## 2018-04-30 LAB — HEMOGLOBIN A1C
HEMOGLOBIN A1C: 5.7 % — AB (ref 4.8–5.6)
Mean Plasma Glucose: 116.89 mg/dL

## 2018-04-30 LAB — LIPID PANEL
Cholesterol: 164 mg/dL (ref 0–200)
HDL: 59 mg/dL (ref >40)
LDL Cholesterol: 96 mg/dL (ref 0–99)
TRIGLYCERIDES: 47 mg/dL (ref <150)
Total CHOL/HDL Ratio: 2.8 RATIO
VLDL: 9 mg/dL (ref 0–40)

## 2018-05-01 LAB — VITAMIN D 25 HYDROXY (VIT D DEFICIENCY, FRACTURES): Vit D, 25-Hydroxy: 10 ng/mL — ABNORMAL LOW (ref 30.0–100.0)

## 2018-05-05 ENCOUNTER — Ambulatory Visit (INDEPENDENT_AMBULATORY_CARE_PROVIDER_SITE_OTHER): Payer: Medicare HMO

## 2018-05-05 ENCOUNTER — Other Ambulatory Visit: Payer: Self-pay | Admitting: Family Medicine

## 2018-05-05 DIAGNOSIS — Z23 Encounter for immunization: Secondary | ICD-10-CM

## 2018-05-07 ENCOUNTER — Encounter: Payer: Self-pay | Admitting: Family Medicine

## 2018-05-08 DIAGNOSIS — H35033 Hypertensive retinopathy, bilateral: Secondary | ICD-10-CM | POA: Diagnosis not present

## 2018-05-08 DIAGNOSIS — I10 Essential (primary) hypertension: Secondary | ICD-10-CM | POA: Diagnosis not present

## 2018-05-13 ENCOUNTER — Encounter: Payer: Self-pay | Admitting: Orthopedic Surgery

## 2018-05-13 ENCOUNTER — Ambulatory Visit: Payer: Medicare HMO | Admitting: Orthopedic Surgery

## 2018-06-08 ENCOUNTER — Emergency Department (HOSPITAL_COMMUNITY): Payer: Medicare HMO

## 2018-06-08 ENCOUNTER — Observation Stay (HOSPITAL_COMMUNITY)
Admission: EM | Admit: 2018-06-08 | Discharge: 2018-06-09 | Disposition: A | Discharge status: Home / Self Care | Payer: Medicare HMO | Attending: Internal Medicine | Admitting: Internal Medicine

## 2018-06-08 ENCOUNTER — Other Ambulatory Visit: Payer: Self-pay

## 2018-06-08 ENCOUNTER — Encounter (HOSPITAL_COMMUNITY): Payer: Self-pay | Admitting: Emergency Medicine

## 2018-06-08 DIAGNOSIS — J45909 Unspecified asthma, uncomplicated: Secondary | ICD-10-CM

## 2018-06-08 DIAGNOSIS — G4733 Obstructive sleep apnea (adult) (pediatric): Secondary | ICD-10-CM | POA: Diagnosis not present

## 2018-06-08 DIAGNOSIS — E662 Morbid (severe) obesity with alveolar hypoventilation: Secondary | ICD-10-CM | POA: Diagnosis not present

## 2018-06-08 DIAGNOSIS — Z96653 Presence of artificial knee joint, bilateral: Secondary | ICD-10-CM | POA: Diagnosis not present

## 2018-06-08 DIAGNOSIS — R0602 Shortness of breath: Secondary | ICD-10-CM | POA: Diagnosis not present

## 2018-06-08 DIAGNOSIS — J668 Airway disease due to other specific organic dusts: Secondary | ICD-10-CM | POA: Diagnosis not present

## 2018-06-08 DIAGNOSIS — Z7982 Long term (current) use of aspirin: Secondary | ICD-10-CM | POA: Insufficient documentation

## 2018-06-08 DIAGNOSIS — I10 Essential (primary) hypertension: Secondary | ICD-10-CM | POA: Diagnosis not present

## 2018-06-08 DIAGNOSIS — J209 Acute bronchitis, unspecified: Secondary | ICD-10-CM | POA: Insufficient documentation

## 2018-06-08 DIAGNOSIS — R0902 Hypoxemia: Principal | ICD-10-CM | POA: Diagnosis present

## 2018-06-08 DIAGNOSIS — E876 Hypokalemia: Secondary | ICD-10-CM

## 2018-06-08 DIAGNOSIS — Z79899 Other long term (current) drug therapy: Secondary | ICD-10-CM | POA: Insufficient documentation

## 2018-06-08 DIAGNOSIS — R059 Cough, unspecified: Secondary | ICD-10-CM

## 2018-06-08 DIAGNOSIS — R05 Cough: Secondary | ICD-10-CM

## 2018-06-08 DIAGNOSIS — J40 Bronchitis, not specified as acute or chronic: Secondary | ICD-10-CM

## 2018-06-08 LAB — COMPREHENSIVE METABOLIC PANEL
ALT: 10 U/L (ref 0–44)
AST: 14 U/L — ABNORMAL LOW (ref 15–41)
Albumin: 3.6 g/dL (ref 3.5–5.0)
Alkaline Phosphatase: 63 U/L (ref 38–126)
Anion gap: 8 (ref 5–15)
BUN: 8 mg/dL (ref 8–23)
CO2: 33 mmol/L — ABNORMAL HIGH (ref 22–32)
CREATININE: 0.66 mg/dL (ref 0.44–1.00)
Calcium: 8.1 mg/dL — ABNORMAL LOW (ref 8.9–10.3)
Chloride: 98 mmol/L (ref 98–111)
GFR calc Af Amer: >60 mL/min (ref >60)
GFR calc non Af Amer: >60 mL/min (ref >60)
Glucose, Bld: 107 mg/dL — ABNORMAL HIGH (ref 70–99)
Potassium: 3.2 mmol/L — ABNORMAL LOW (ref 3.5–5.1)
SODIUM: 139 mmol/L (ref 135–145)
Total Bilirubin: 0.7 mg/dL (ref 0.3–1.2)
Total Protein: 8.4 g/dL — ABNORMAL HIGH (ref 6.5–8.1)

## 2018-06-08 LAB — CBC WITH DIFFERENTIAL/PLATELET
ABS IMMATURE GRANULOCYTES: 0.04 10*3/uL (ref 0.00–0.07)
Basophils Absolute: 0 10*3/uL (ref 0.0–0.1)
Basophils Relative: 0 %
Eosinophils Absolute: 0.2 10*3/uL (ref 0.0–0.5)
Eosinophils Relative: 3 %
HCT: 40.2 % (ref 36.0–46.0)
Hemoglobin: 11.8 g/dL — ABNORMAL LOW (ref 12.0–15.0)
IMMATURE GRANULOCYTES: 1 %
Lymphocytes Relative: 18 %
Lymphs Abs: 1.6 10*3/uL (ref 0.7–4.0)
MCH: 26.3 pg (ref 26.0–34.0)
MCHC: 29.4 g/dL — ABNORMAL LOW (ref 30.0–36.0)
MCV: 89.5 fL (ref 80.0–100.0)
Monocytes Absolute: 0.9 10*3/uL (ref 0.1–1.0)
Monocytes Relative: 10 %
NEUTROS PCT: 68 %
NRBC: 0 % (ref 0.0–0.2)
Neutro Abs: 6 10*3/uL (ref 1.7–7.7)
Platelets: 184 10*3/uL (ref 150–400)
RBC: 4.49 MIL/uL (ref 3.87–5.11)
RDW: 14.7 % (ref 11.5–15.5)
WBC: 8.8 10*3/uL (ref 4.0–10.5)

## 2018-06-08 LAB — BRAIN NATRIURETIC PEPTIDE: B Natriuretic Peptide: 96 pg/mL (ref 0.0–100.0)

## 2018-06-08 LAB — TROPONIN I: Troponin I: <0.03 ng/mL (ref <0.03)

## 2018-06-08 LAB — D-DIMER, QUANTITATIVE: D-Dimer, Quant: 0.37 ug/mL-FEU (ref 0.00–0.50)

## 2018-06-08 MED ORDER — ALBUTEROL SULFATE (2.5 MG/3ML) 0.083% IN NEBU
2.5000 mg | INHALATION_SOLUTION | Freq: Once | RESPIRATORY_TRACT | Status: AC
  Administered 2018-06-08: 2.5 mg via RESPIRATORY_TRACT
  Filled 2018-06-08: qty 3

## 2018-06-08 MED ORDER — ACETAMINOPHEN 650 MG RE SUPP: 650.0000 mg | Freq: Four times a day (QID) | RECTAL | Status: DC | PRN

## 2018-06-08 MED ORDER — IPRATROPIUM-ALBUTEROL 0.5-2.5 (3) MG/3ML IN SOLN
3.0000 mL | Freq: Once | RESPIRATORY_TRACT | Status: AC
  Administered 2018-06-08: 3 mL via RESPIRATORY_TRACT
  Filled 2018-06-08: qty 3

## 2018-06-08 MED ORDER — ALBUTEROL SULFATE HFA 108 (90 BASE) MCG/ACT IN AERS: 2.0000 | INHALATION_SPRAY | Freq: Once | RESPIRATORY_TRACT | Status: DC

## 2018-06-08 MED ORDER — METHYLPREDNISOLONE SODIUM SUCC 125 MG IJ SOLR
125.0000 mg | Freq: Once | INTRAMUSCULAR | Status: AC
  Administered 2018-06-08: 125 mg via INTRAVENOUS
  Filled 2018-06-08: qty 2

## 2018-06-08 MED ORDER — AMLODIPINE BESYLATE 5 MG PO TABS
5.0000 mg | ORAL_TABLET | Freq: Every morning | ORAL | Status: DC
  Administered 2018-06-09: 5 mg via ORAL
  Filled 2018-06-08: qty 1

## 2018-06-08 MED ORDER — ACETAMINOPHEN 325 MG PO TABS: 650.0000 mg | ORAL_TABLET | Freq: Four times a day (QID) | ORAL | Status: DC | PRN

## 2018-06-08 MED ORDER — AZITHROMYCIN 250 MG PO TABS
250.0000 mg | ORAL_TABLET | Freq: Every day | ORAL | Status: DC
  Administered 2018-06-09: 250 mg via ORAL
  Filled 2018-06-08: qty 1

## 2018-06-08 MED ORDER — IPRATROPIUM-ALBUTEROL 0.5-2.5 (3) MG/3ML IN SOLN
3.0000 mL | Freq: Three times a day (TID) | RESPIRATORY_TRACT | Status: DC
  Administered 2018-06-09: 3 mL via RESPIRATORY_TRACT
  Filled 2018-06-08: qty 3

## 2018-06-08 MED ORDER — POTASSIUM CHLORIDE CRYS ER 20 MEQ PO TBCR
20.0000 meq | EXTENDED_RELEASE_TABLET | Freq: Once | ORAL | Status: AC
  Administered 2018-06-08: 20 meq via ORAL
  Filled 2018-06-08: qty 1

## 2018-06-08 MED ORDER — AZITHROMYCIN 250 MG PO TABS
500.0000 mg | ORAL_TABLET | Freq: Once | ORAL | Status: AC
  Administered 2018-06-08: 500 mg via ORAL
  Filled 2018-06-08: qty 2

## 2018-06-08 MED ORDER — ENOXAPARIN SODIUM 60 MG/0.6ML ~~LOC~~ SOLN
60.0000 mg | SUBCUTANEOUS | Status: DC
  Administered 2018-06-08: 60 mg via SUBCUTANEOUS
  Filled 2018-06-08: qty 0.6

## 2018-06-08 MED ORDER — ALBUTEROL SULFATE (2.5 MG/3ML) 0.083% IN NEBU: 2.5000 mg | INHALATION_SOLUTION | RESPIRATORY_TRACT | Status: DC | PRN

## 2018-06-08 MED ORDER — PREDNISONE 50 MG PO TABS: 60.0000 mg | ORAL_TABLET | Freq: Once | ORAL | Status: DC

## 2018-06-08 MED ORDER — IPRATROPIUM-ALBUTEROL 0.5-2.5 (3) MG/3ML IN SOLN
3.0000 mL | Freq: Four times a day (QID) | RESPIRATORY_TRACT | Status: DC
  Administered 2018-06-08: 3 mL via RESPIRATORY_TRACT
  Filled 2018-06-08: qty 3

## 2018-06-08 MED ORDER — MAGNESIUM SULFATE 2 GM/50ML IV SOLN
2.0000 g | Freq: Once | INTRAVENOUS | Status: AC
  Administered 2018-06-08: 2 g via INTRAVENOUS
  Filled 2018-06-08: qty 50

## 2018-06-08 MED ORDER — ORAL CARE MOUTH RINSE
15.0000 mL | Freq: Two times a day (BID) | OROMUCOSAL | Status: DC
  Administered 2018-06-09: 15 mL via OROMUCOSAL

## 2018-06-08 NOTE — ED Provider Notes (Signed)
[facility] EMERGENCY DEPARTMENT Provider Note   CSN: [id number] Arrival date & time: 06/08/18  1522     History   Chief Complaint Chief Complaint  Patient presents with  . Cough    HPI [name] is a 73 y.o. female.  She is complaining of a cough for about a week that worsened over the last couple of days.  Says it is productive of some white sputum.  No fevers or chills.  She has had some nasal congestion and some postnasal drip.  No nausea no vomiting no diarrhea.  She says her lateral chest hurts into her back from coughing.  She does not usually require oxygen.  The history is provided by the patient.  Cough  Cough characteristics:  Productive Sputum characteristics:  White Severity:  Moderate Onset quality:  Gradual Progression:  Worsening Chronicity:  New Smoker: no   Relieved by:  Nothing Worsened by:  Activity Associated symptoms: chest pain, rhinorrhea, shortness of breath and sinus congestion   Associated symptoms: no chills, no diaphoresis, no ear pain, no eye discharge, no fever, no headaches, no myalgias, no rash, no sore throat and no wheezing     Past Medical History:  Diagnosis Date  . Bronchitis   . Chronic back pain   . Essential hypertension   . Hyperlipidemia   . IGT (impaired glucose tolerance) 2015  . Obesity, unspecified   . Osteoarthritis   . Seasonal allergies     Patient Active Problem List   Diagnosis Date Noted  . S/P total knee arthroplasty, left 04/29/17 05/06/2017  . Anemia 05/06/2017  . Primary osteoarthritis of left knee 04/29/2017  . Primary osteoarthritis of both knees   . Abnormal CT scan, colon 01/18/2016  . Urinary frequency 01/06/2016  . Abnormal myocardial perfusion study   . Fatigue 07/18/2014  . Vitamin D deficiency 07/18/2014  . Exercise intolerance 07/18/2014  . Sleep disorder 07/18/2014  . Nonspecific abnormal electrocardiogram (ECG) (EKG) 07/18/2014  . Reduced vision 12/27/2013  . Radicular pain of  thoracic region 06/22/2013  . Prediabetes 10/20/2012  . Metabolic syndrome X 10/20/2012  . BACK PAIN WITH RADICULOPATHY 09/20/2008  . Hyperlipidemia LDL goal <100 03/29/2008  . Morbid obesity (HCC) 03/29/2008  . Essential hypertension 10/07/2007    Past Surgical History:  Procedure Laterality Date  . CARDIAC CATHETERIZATION N/A 10/25/2014   Procedure: Left Heart Cath and Coronary Angiography;  Surgeon: Kathleene Hazelhristopher D McAlhany, MD;  [facility];  Service: Cardiovascular;  Laterality: N/A;  . COLONOSCOPY N/A 02/09/2016   Procedure: COLONOSCOPY;  Surgeon: West BaliSandi L Fields, MD;  Location: [facility] ENDO SUITE;  Service: Endoscopy;  Laterality: N/A;  12:30 PM  . KNEE ARTHROSCOPY Left   . TOTAL ABDOMINAL HYSTERECTOMY  1995 approx   Fibroids  . TOTAL KNEE ARTHROPLASTY Right 2011  . TOTAL KNEE ARTHROPLASTY Left 04/29/2017   Procedure: TOTAL KNEE ARTHROPLASTY;  Surgeon: Vickki HearingHarrison, Stanley E, MD;  Location: [facility] ORS;  Service: Orthopedics;  Laterality: Left;     OB History   No obstetric history on file.      Home Medications    Prior to Admission medications   Medication Sig Start Date End Date Taking? Authorizing Provider  amLODipine (NORVASC) 2.5 MG tablet TAKE ONE TABLET BY MOUTH DAILY. 05/05/18   Kerri PerchesSimpson, Margaret E, MD  amLODipine (NORVASC) 5 MG tablet TAKE ONE TABLET BY MOUTH DAILY. 01/27/18   Kerri PerchesSimpson, Margaret E, MD  aspirin EC 81 MG tablet Take 81 mg by mouth 2 (  two) times daily.    [provider]  ferrous sulfate (IRON SUPPLEMENT) 325 (65 FE) MG tablet Take 325 mg by mouth 2 (two) times daily with a meal.    [provider]  Menthol, Topical Analgesic, (BENGAY EX) Apply 1 application topically daily as needed (shoulder pain).    [provider]  Vitamin D, Cholecalciferol, 1000 units TABS Take 1,000 Units by mouth daily.    [provider]    Family History Family History  Problem Relation Age of Onset  . Hypertension Mother   . Stroke  Mother 67       Deceased due CVa at 76  . Diabetes Father   . Cancer Father 67       Unknown, somewhere in stomach  . Hypertension Brother   . Hypertension Brother   . Hypertension Brother   . Cancer Maternal Grandfather        ?type  . Hypertension Son     Social History Social History   Tobacco Use  . Smoking status: Never Smoker  . Smokeless tobacco: Never Used  Substance Use Topics  . Alcohol use: No  . Drug use: No     Allergies   Promethazine hcl   Review of Systems Review of Systems  Constitutional: Negative for chills, diaphoresis and fever.  HENT: Positive for rhinorrhea. Negative for ear pain and sore throat.   Eyes: Negative for discharge and visual disturbance.  Respiratory: Positive for cough and shortness of breath. Negative for wheezing.   Cardiovascular: Positive for chest pain. Negative for leg swelling.  Gastrointestinal: Negative for abdominal pain.  Genitourinary: Negative for dysuria.  Musculoskeletal: Positive for back pain. Negative for myalgias.  Skin: Negative for rash.  Neurological: Negative for headaches.     Physical Exam Updated Vital Signs BP 138/63 (BP Location: Right Arm)   Pulse 97   Temp 97.9 F (36.6 C) (Temporal)   Resp 20   Ht 5\' 2"  (1.575 m)   Wt 113.4 kg   SpO2 (S) (!) 79%   BMI 45.73 kg/m   Physical Exam Vitals signs and nursing note reviewed.  Constitutional:      General: She is not in acute distress.    Appearance: She is well-developed.  HENT:     Head: Normocephalic and atraumatic.  Eyes:     Conjunctiva/sclera: Conjunctivae normal.  Neck:     Musculoskeletal: Neck supple.  Cardiovascular:     Rate and Rhythm: Normal rate and regular rhythm.     Heart sounds: No murmur.  Pulmonary:     Effort: Pulmonary effort is normal. No respiratory distress.     Breath sounds: Normal breath sounds.  Abdominal:     Palpations: Abdomen is soft.     Tenderness: There is no abdominal tenderness.    Musculoskeletal:        General: No tenderness or signs of injury.  Skin:    General: Skin is warm and dry.     Capillary Refill: Capillary refill takes less than 2 seconds.  Neurological:     General: No focal deficit present.     Mental Status: She is alert.      ED Treatments / Results  Labs (all labs ordered are listed, but only abnormal results are displayed) Labs Reviewed  CBC WITH DIFFERENTIAL/PLATELET - Abnormal; Notable for the following components:      Result Value   Hemoglobin 11.8 (*)    MCHC 29.4 (*)    All other components  within normal limits  COMPREHENSIVE METABOLIC PANEL - Abnormal; Notable for the following components:   Potassium 3.2 (*)    CO2 33 (*)    Glucose, Bld 107 (*)    Calcium 8.1 (*)    Total Protein 8.4 (*)    AST 14 (*)    All other components within normal limits  BASIC METABOLIC PANEL - Abnormal; Notable for the following components:   Glucose, Bld 201 (*)    Calcium 8.5 (*)    All other components within normal limits  CBC - Abnormal; Notable for the following components:   WBC 10.6 (*)    MCHC 28.7 (*)    All other components within normal limits  TROPONIN I  BRAIN NATRIURETIC PEPTIDE  D-DIMER, QUANTITATIVE (NOT AT [facility])  HIV ANTIBODY (ROUTINE TESTING W REFLEX)    EKG EKG Interpretation  Date/Time:  Monday June 08 2018 16:46:50 EST Ventricular Rate:  76 PR Interval:    QRS Duration: 100 QT Interval:  398 QTC Calculation: 448 R Axis:   98 Text Interpretation:  Sinus rhythm Right axis deviation Borderline T abnormalities, anterior leads Baseline wander in lead(s) V3 No significant change since last tracing Confirmed by Linwood Dibbles [phone]) on 06/09/2018 12:55:43 PM   Radiology Dg Chest 2 View  Result Date: 06/08/2018 CLINICAL DATA:  Cough. EXAM: CHEST - 2 VIEW COMPARISON:  Radiographs of July 09, 2017. FINDINGS: Stable cardiomegaly. No pneumothorax or pleural effusion is noted. No acute pulmonary disease is noted. Bony  thorax is unremarkable. IMPRESSION: No active cardiopulmonary disease. Electronically Signed   By: Lupita Raider, M.D.   On: 06/08/2018 15:51    Procedures Procedures (including critical care time)  Medications Ordered in ED Medications  ipratropium-albuterol (DUONEB) 0.5-2.5 (3) MG/3ML nebulizer solution 3 mL (has no administration in time range)     Initial Impression / Assessment and Plan / ED Course  I have reviewed the triage vital signs and the nursing notes.  Pertinent labs & imaging results that were available during my care of the patient were reviewed by me and considered in my medical decision making (see chart for details).  Clinical Course as of Jun 09 1836  Sheral Flow Jun 08, 2018  1702 EKG is normal sinus rhythm with a rate of 76 normal intervals borderline T wave flattening anteriorly no acute ST-T changes.   [MB]  1812 Patient trialed off of oxygen and ambulated and ended up dropping into the 60s.  Will need admission.   [MB]    Clinical Course User Index [MB] Terrilee Files, MD     Final Clinical Impressions(s) / ED Diagnoses   Final diagnoses:  Hypoxia  Cough  Acute bronchitis, unspecified organism    ED Discharge Orders    None       Terrilee Files, MD 06/09/18 Paulo Fruit

## 2018-06-08 NOTE — H&P (Signed)
History and Physical    Patricia EmperorSylvia A Tibbits ZOX:[id number] DOB: [birth year] DOA: 06/08/2018  PCP: Kerri PerchesSimpson, Margaret E, MD  Patient coming from: Home  I have personally briefly reviewed patient's old medical [facility]  Chief Complaint: Cough  HPI: Patricia Fuentes is a 73 y.o. female with medical history significant for HTN, HLD, and OA presents the ED with about 1 week of cough productive of white sputum.  She has had intermittent episodes of feeling "woozy" but denies any other associated symptoms including fevers, diaphoresis, dyspnea, chest pain, palpitations, abdominal pain, or dysuria.  She has noted some swelling in her right leg.  She does report snoring at night says that her husband has noticed that she has occasional episodes where she stops breathing while sleeping.  She also reports daytime fatigue requiring naps.  She was referred for a sleep study however rescheduled it.  She denies any personal history of lung disease including asthma or COPD.  She is a never smoker but says she has occasional exposure to secondhand smoke.  ED Course:  Initial vitals showed BP 130/63, pulse 97, RR 20, temp 97.9 Fahrenheit, SPO2 79% on room air.  Labs are notable for WBC 8.8, hemoglobin 11.8, platelets 184, K 3.2, BNP 96.0, troponin I <0.03, d-dimer 0.37.  2 view chest x-ray was negative for focal consolidation, infiltrate, or pleural effusion.  Per documentation, patient's O2 saturation on room air decreased to 84% while in bed and down to 67% while ambulating, the hospitalist service was therefore consulted admit for hypoxia.  Review of Systems: As per HPI otherwise 10 point review of systems negative.    Past Medical History:  Diagnosis Date  . Bronchitis   . Chronic back pain   . Essential hypertension   . Hyperlipidemia   . IGT (impaired glucose tolerance) 2015  . Obesity, unspecified   . Osteoarthritis   . Seasonal allergies     Past Surgical History:    Procedure Laterality Date  . CARDIAC CATHETERIZATION N/A 10/25/2014   Procedure: Left Heart Cath and Coronary Angiography;  Surgeon: Kathleene Hazelhristopher D McAlhany, MD;  [facility];  Service: Cardiovascular;  Laterality: N/A;  . COLONOSCOPY N/A 02/09/2016   Procedure: COLONOSCOPY;  Surgeon: West BaliSandi L Fields, MD;  Location: [facility] ENDO SUITE;  Service: Endoscopy;  Laterality: N/A;  12:30 PM  . KNEE ARTHROSCOPY Left   . TOTAL ABDOMINAL HYSTERECTOMY  1995 approx   Fibroids  . TOTAL KNEE ARTHROPLASTY Right 2011  . TOTAL KNEE ARTHROPLASTY Left 04/29/2017   Procedure: TOTAL KNEE ARTHROPLASTY;  Surgeon: Vickki HearingHarrison, Stanley E, MD;  Location: [facility] ORS;  Service: Orthopedics;  Laterality: Left;     reports that she has never smoked. She has never used smokeless tobacco. She reports that she does not drink alcohol or use drugs.  Allergies  Allergen Reactions  . Promethazine Hcl Nausea And Vomiting and Other (See Comments)    dizzy    Family History  Problem Relation Age of Onset  . Hypertension Mother   . Stroke Mother 4859       Deceased due CVa at 3559  . Diabetes Father   . Cancer Father 7072       Unknown, somewhere in stomach  . Hypertension Brother   . Hypertension Brother   . Hypertension Brother   . Cancer Maternal Grandfather        ?type  . Hypertension Son      Prior to Admission medications   Medication  Sig Start Date End Date Taking? Authorizing Provider  amLODipine (NORVASC) 2.5 MG tablet TAKE ONE TABLET BY MOUTH DAILY. Patient taking differently: Take 2.5 mg by mouth every evening.  05/05/18  Yes Kerri Perches, MD  amLODipine (NORVASC) 5 MG tablet TAKE ONE TABLET BY MOUTH DAILY. Patient taking differently: Take 5 mg by mouth every morning.  01/27/18  Yes Kerri Perches, MD  aspirin EC 81 MG tablet Take 162 mg by mouth every morning.    Yes [provider]    Physical Exam: Vitals:   06/08/18 1624 06/08/18 1930 06/08/18 2059 06/08/18 2107  BP:  (!)  148/73 (!) 156/61   Pulse: 84 81 83   Resp:  20 20   Temp:   99.2 F (37.3 C)   TempSrc:   Oral   SpO2: 99% 97% 94% 98%  Weight:      Height:        Constitutional: Obese woman resting supine in bed, NAD, calm, comfortable Eyes: PERRL, lids and conjunctivae normal ENMT: Mucous membranes are moist. Posterior pharynx clear of any exudate or lesions. Normal dentition.  Neck: normal, supple, no masses. Respiratory: Faint end expiratory wheezing bilateral lower lung fields. Normal respiratory effort. No accessory muscle use.  Cardiovascular: Regular rate and rhythm, soft systolic murmur present.  Trace edema bilateral lower extremities. Abdomen: no tenderness, no masses palpated. No hepatosplenomegaly. Bowel sounds positive.  Musculoskeletal: no clubbing / cyanosis. No joint deformity upper and lower extremities. Good ROM, no contractures. Normal muscle tone.  Skin: no rashes, lesions, ulcers. No induration Neurologic: CN 2-12 grossly intact. Sensation intact, Strength 5/5 in all 4.  Psychiatric: Normal judgment and insight. Alert and oriented x 3. Normal mood.     Labs on Admission: I have personally reviewed following labs and imaging studies  CBC: Recent Labs  Lab 06/08/18 1613  WBC 8.8  NEUTROABS 6.0  HGB 11.8*  HCT 40.2  MCV 89.5  PLT 184   Basic Metabolic Panel: Recent Labs  Lab 06/08/18 1613  NA 139  K 3.2*  CL 98  CO2 33*  GLUCOSE 107*  BUN 8  CREATININE 0.66  CALCIUM 8.1*   GFR: Estimated Creatinine Clearance: 77.9 mL/min (by C-G formula based on SCr of 0.66 mg/dL). Liver Function Tests: Recent Labs  Lab 06/08/18 1613  AST 14*  ALT 10  ALKPHOS 63  BILITOT 0.7  PROT 8.4*  ALBUMIN 3.6   No results for input(s): LIPASE, AMYLASE in the last 168 hours. No results for input(s): AMMONIA in the last 168 hours. Coagulation Profile: No results for input(s): INR, PROTIME in the last 168 hours. Cardiac Enzymes: Recent Labs  Lab 06/08/18 1613  TROPONINI  <0.03   BNP (last 3 results) No results for input(s): PROBNP in the last 8760 hours. HbA1C: No results for input(s): HGBA1C in the last 72 hours. CBG: No results for input(s): GLUCAP in the last 168 hours. Lipid Profile: No results for input(s): CHOL, HDL, LDLCALC, TRIG, CHOLHDL, LDLDIRECT in the last 72 hours. Thyroid Function Tests: No results for input(s): TSH, T4TOTAL, FREET4, T3FREE, THYROIDAB in the last 72 hours. Anemia Panel: No results for input(s): VITAMINB12, FOLATE, FERRITIN, TIBC, IRON, RETICCTPCT in the last 72 hours. Urine analysis:    Component Value Date/Time   COLORURINE YELLOW 08/03/2014 1647   APPEARANCEUR TURBID (A) 08/03/2014 1647   LABSPEC 1.010 08/03/2014 1647   PHURINE 6.0 08/03/2014 1647   GLUCOSEU NEGATIVE 08/03/2014 1647   HGBUR TRACE (A) 08/03/2014 1647  HGBUR trace-intact 06/25/2010 0926   BILIRUBINUR small 07/23/2017 1428   KETONESUR NEGATIVE 08/03/2014 1647   PROTEINUR trace 07/23/2017 1428   PROTEINUR NEGATIVE 08/03/2014 1647   UROBILINOGEN 0.2 07/23/2017 1428   UROBILINOGEN 0.2 08/03/2014 1647   NITRITE neg 07/23/2017 1428   NITRITE NEGATIVE 08/03/2014 1647   LEUKOCYTESUR Negative 07/23/2017 1428    Radiological Exams on Admission: Dg Chest 2 View  Result Date: 06/08/2018 CLINICAL DATA:  Cough. EXAM: CHEST - 2 VIEW COMPARISON:  Radiographs of July 09, 2017. FINDINGS: Stable cardiomegaly. No pneumothorax or pleural effusion is noted. No acute pulmonary disease is noted. Bony thorax is unremarkable. IMPRESSION: No active cardiopulmonary disease. Electronically Signed   By: Lupita Raider, M.D.   On: 06/08/2018 15:51    EKG: Independently reviewed. Sinus rhythm, rate 78 bpm, RAD, no acute ischemic changes.  Assessment/Plan Principal Problem:   Hypoxia Active Problems:   Essential hypertension   Hypokalemia  LAQUILA HUCK is a 73 y.o. female with medical history significant for HTN, HLD, and OA presents the ED with about 1 week  of cough productive of white sputum was admitted with hypoxia.  Hypoxia: Unclear cause, doubt PE with normal d-dimer and stable vital signs.  Chest x-ray without evidence of pneumonia.  She has faint expiratory wheezing on exam and potentially could be related to bronchitis.  Will treat with scheduled nebulizers overnight as well as azithromycin.  She has symptoms of OSA (has not had a sleep study), and trace edema which may also be playing a role.  Can consider echocardiogram to assess for pulmonary hypertension if hypoxia does not improve with breathing treatments overnight.  Hypokalemia: K 3.2 on admission, given K 20 mEq in ED.  Will repeat supplement and recheck in a.m.  Hypertension: Continue amlodipine.   DVT prophylaxis: Lovenox Code Status: Full code Family Communication: None present at bedside on admission Disposition Plan: Pending improvement in hypoxia. Consults called: None Admission status: Observation   Darreld Mclean MD [facility] Pager [phone]  If 7PM-7AM, please contact night-coverage [email or web address] Password [facility]  06/08/2018, 9:25 PM

## 2018-06-08 NOTE — ED Triage Notes (Signed)
Pt states she has been coughing badly since last night with pain in her ribs.

## 2018-06-08 NOTE — ED Notes (Signed)
Pt placed on RA with O2 sats around 84% while in bed. Pt denied SOB. Pt ambulated right outside her room and O2 sats dropped to its lowest at 67% on RA. Pt continued to deny SOB but did report some dizziness. Dr. Charm Barges notified.

## 2018-06-08 NOTE — ED Notes (Signed)
Pt placed on 2 L [place].  Denies shortness of breath.

## 2018-06-08 NOTE — ED Notes (Signed)
Pt ambulated well. O2 sats dropped down to 88 while walking.

## 2018-06-09 DIAGNOSIS — E876 Hypokalemia: Secondary | ICD-10-CM

## 2018-06-09 DIAGNOSIS — G4733 Obstructive sleep apnea (adult) (pediatric): Secondary | ICD-10-CM | POA: Diagnosis not present

## 2018-06-09 DIAGNOSIS — J209 Acute bronchitis, unspecified: Secondary | ICD-10-CM

## 2018-06-09 DIAGNOSIS — J40 Bronchitis, not specified as acute or chronic: Secondary | ICD-10-CM

## 2018-06-09 DIAGNOSIS — R0902 Hypoxemia: Secondary | ICD-10-CM | POA: Diagnosis not present

## 2018-06-09 DIAGNOSIS — J45909 Unspecified asthma, uncomplicated: Secondary | ICD-10-CM

## 2018-06-09 DIAGNOSIS — Z4789 Encounter for other orthopedic aftercare: Secondary | ICD-10-CM | POA: Diagnosis not present

## 2018-06-09 DIAGNOSIS — E662 Morbid (severe) obesity with alveolar hypoventilation: Secondary | ICD-10-CM | POA: Diagnosis not present

## 2018-06-09 DIAGNOSIS — I10 Essential (primary) hypertension: Secondary | ICD-10-CM

## 2018-06-09 DIAGNOSIS — J4521 Mild intermittent asthma with (acute) exacerbation: Secondary | ICD-10-CM

## 2018-06-09 DIAGNOSIS — Z96659 Presence of unspecified artificial knee joint: Secondary | ICD-10-CM | POA: Diagnosis not present

## 2018-06-09 LAB — CBC
HCT: 41.8 % (ref 36.0–46.0)
Hemoglobin: 12 g/dL (ref 12.0–15.0)
MCH: 26 pg (ref 26.0–34.0)
MCHC: 28.7 g/dL — ABNORMAL LOW (ref 30.0–36.0)
MCV: 90.7 fL (ref 80.0–100.0)
NRBC: 0 % (ref 0.0–0.2)
Platelets: 170 10*3/uL (ref 150–400)
RBC: 4.61 MIL/uL (ref 3.87–5.11)
RDW: 14.6 % (ref 11.5–15.5)
WBC: 10.6 10*3/uL — ABNORMAL HIGH (ref 4.0–10.5)

## 2018-06-09 LAB — BASIC METABOLIC PANEL
Anion gap: 7 (ref 5–15)
BUN: 10 mg/dL (ref 8–23)
CO2: 32 mmol/L (ref 22–32)
Calcium: 8.5 mg/dL — ABNORMAL LOW (ref 8.9–10.3)
Chloride: 98 mmol/L (ref 98–111)
Creatinine, Ser: 0.61 mg/dL (ref 0.44–1.00)
GFR calc Af Amer: >60 mL/min (ref >60)
GFR calc non Af Amer: >60 mL/min (ref >60)
Glucose, Bld: 201 mg/dL — ABNORMAL HIGH (ref 70–99)
Potassium: 3.9 mmol/L (ref 3.5–5.1)
Sodium: 137 mmol/L (ref 135–145)

## 2018-06-09 MED ORDER — GUAIFENESIN-DM 100-10 MG/5ML PO SYRP: 5.0000 mL | ORAL_SOLUTION | ORAL | Status: DC | PRN

## 2018-06-09 MED ORDER — FLUTICASONE PROPIONATE 50 MCG/ACT NA SUSP: 1.0000 | Freq: Every day | NASAL | 1 refills | Status: DC

## 2018-06-09 MED ORDER — IPRATROPIUM-ALBUTEROL 0.5-2.5 (3) MG/3ML IN SOLN: 3.0000 mL | Freq: Four times a day (QID) | RESPIRATORY_TRACT | Status: DC | PRN

## 2018-06-09 MED ORDER — ALBUTEROL SULFATE HFA 108 (90 BASE) MCG/ACT IN AERS: 1.0000 | INHALATION_SPRAY | Freq: Four times a day (QID) | RESPIRATORY_TRACT | 2 refills | Status: DC | PRN

## 2018-06-09 MED ORDER — AZITHROMYCIN 250 MG PO TABS: 250.0000 mg | ORAL_TABLET | Freq: Every day | ORAL | 0 refills | Status: AC

## 2018-06-09 NOTE — Discharge Summary (Signed)
Physician Discharge Summary  Patricia Fuentes ZSM:[id number] DOB: 10/08/1945 DOA: 06/08/2018  PCP: Kerri Perches, MD  Admit date: 06/08/2018 Discharge date: 06/09/2018  Time spent: 35 minutes  Recommendations for Outpatient Follow-up:  1. Reassess the need of oxygen supplementation 2. Make sure patient has had a sleep study and initiation on CPAP as needed. 3. Reassess blood pressure and adjust antihypertensive regimen as needed 4. Repeat basic metabolic panel to follow electrolytes and renal function   Discharge Diagnoses:  Principal Problem:   Hypoxia Active Problems:   Obesity hypoventilation syndrome (HCC)   Essential hypertension   Hypokalemia   Acute bronchitis   Obstructive sleep apnea   Reactive airway disease   Discharge Condition: Stable and improved.  Patient discharged home with instruction to follow-up with PCP in 10 days.  Arrangement has been made to provide oxygen supplementation at discharge.  Diet recommendation: Heart healthy and low calorie diet.  Filed Weights   06/08/18 1523  Weight: 113.4 kg    History of present illness:  As per H&P written by Dr. Allena Katz on 06/08/2018 73 y.o. female with medical history significant for HTN, HLD, and OA presents the ED with about 1 week of cough productive of white sputum.  She has had intermittent episodes of feeling "woozy" but denies any other associated symptoms including fevers, diaphoresis, dyspnea, chest pain, palpitations, abdominal pain, or dysuria.  She has noted some swelling in her right leg.  She does report snoring at night says that her husband has noticed that she has occasional episodes where she stops breathing while sleeping.  She also reports daytime fatigue requiring naps.  She was referred for a sleep study however rescheduled it.  She denies any personal history of lung disease including asthma or COPD.  She is a never smoker but says she has occasional exposure to secondhand smoke.  Hospital  Course:  1-hypoxia: In the setting of obesity hypoventilation syndrome, obstructive sleep apnea and acute reactive airway from bronchitis. -Patient with good response to the use of oxygen supplementation (2 L nasal cannula supplementation), initiation of antibiotics and the use of albuterol nebulizer. -Patient advised on the importance for weight loss, outpatient sleep study evaluation with initiation on CPAP if required and also depending clinical response evaluation with pulmonary function test. -Discharged on Zithromax to complete a total of 5 days, Flonase and also as needed albuterol inhaler. -Outpatient follow-up with PCP in 10 days.  2-morbid obesity: With obstructive sleep apnea -Outpatient evaluation as mentioned above for sleep study and CPAP initiation. -Low calorie diet, portion control and increase physical activity to assist with weight loss. -Patient will benefit of referral to bariatric clinic to further assist with weight loss plans. -Body mass index is 45.73 kg/m.  3-HTN -Stable and well-controlled. -Continue amlodipine.  4-hypokalemia -Repleted and within normal limits at discharge.  Procedures:  See below for x-ray reports.  Consultations:  None  Discharge Exam: Vitals:   06/09/18 0739 06/09/18 1410  BP:  (!) 143/66  Pulse:  77  Resp:    Temp:  98.9 F (37.2 C)  SpO2: 96% 98%    General: Afebrile, in no major distress.  Reports feeling better.  Still experiencing some woozy sensation with activity and desaturating in the low 80s on room air.  After applying oxygen supplementation patient with oxygen saturation 95 to 97% at rest and on exertion. Cardiovascular: S1 and S2, no rubs, no gallops, no murmurs. Respiratory: Positive scattered rhonchi right, no using accessory muscles, normal respiratory effort.  No wheezing on examination. Abdomen: Obese, soft, no guarding, positive bowel sounds, no tenderness. Extremities: No cyanosis, no clubbing; trace edema  bilaterally unchanged according to patient.   Discharge Instructions   Discharge Instructions    Diet - low sodium heart healthy   Complete by:  As directed    Discharge instructions   Complete by:  As directed    Medications as prescribed Maintain the use of oxygen supplementation as instructed Follow low calorie diet, increase physical activity and watch portion control. Arrange follow-up with PCP in 10 days Schedule outpatient sleep study as discussed.     Allergies as of 06/09/2018      Reactions   Promethazine Hcl Nausea And Vomiting, Other (See Comments)   dizzy      Medication List    TAKE these medications   albuterol 108 (90 Base) MCG/ACT inhaler Commonly known as:  PROVENTIL HFA;VENTOLIN HFA Inhale 1-2 puffs into the lungs every 6 (six) hours as needed for wheezing or shortness of breath.   amLODipine 5 MG tablet Commonly known as:  NORVASC TAKE ONE TABLET BY MOUTH DAILY. What changed:    when to take this  Another medication with the same name was removed. Continue taking this medication, and follow the directions you see here.   aspirin EC 81 MG tablet Take 162 mg by mouth every morning.   azithromycin 250 MG tablet Commonly known as:  ZITHROMAX Take 1 tablet (250 mg total) by mouth daily for 5 days.   fluticasone 50 MCG/ACT nasal spray Commonly known as:  FLONASE Place 1 spray into both nostrils daily.            Durable Medical Equipment  (From admission, onward)         Start     Ordered   06/09/18 1318  For home use only DME oxygen  Once    Question Answer Comment  Mode or (Route) Nasal cannula   Liters per Minute 2   Frequency Continuous (stationary and portable oxygen unit needed)   Oxygen conserving device Yes   Oxygen delivery system Gas      06/09/18 1318   06/09/18 1125  For home use only DME oxygen  Once    Comments:  Delivery portable concentrator  Question Answer Comment  Mode or (Route) Nasal cannula   Liters per  Minute 2   Oxygen conserving device Yes   Oxygen delivery system Gas      06/09/18 1125         Allergies  Allergen Reactions  . Promethazine Hcl Nausea And Vomiting and Other (See Comments)    dizzy   Follow-up Information    Kerri PerchesSimpson, Margaret E, MD. Schedule an appointment as soon as possible for a visit in 10 day(s).   Specialty:  Family Medicine Contact information: [address] [place] [address] [phone]           The results of significant diagnostics from this hospitalization (including imaging, microbiology, ancillary and laboratory) are listed below for reference.    Significant Diagnostic Studies: Dg Chest 2 View  Result Date: 06/08/2018 CLINICAL DATA:  Cough. EXAM: CHEST - 2 VIEW COMPARISON:  Radiographs of July 09, 2017. FINDINGS: Stable cardiomegaly. No pneumothorax or pleural effusion is noted. No acute pulmonary disease is noted. Bony thorax is unremarkable. IMPRESSION: No active cardiopulmonary disease. Electronically Signed   By: Lupita RaiderJames  Green Jr, M.D.   On: 06/08/2018 15:51   Labs: Basic Metabolic Panel: Recent  Labs  Lab 06/08/18 1613 06/09/18 0509  NA 139 137  K 3.2* 3.9  CL 98 98  CO2 33* 32  GLUCOSE 107* 201*  BUN 8 10  CREATININE 0.66 0.61  CALCIUM 8.1* 8.5*   Liver Function Tests: Recent Labs  Lab 06/08/18 1613  AST 14*  ALT 10  ALKPHOS 63  BILITOT 0.7  PROT 8.4*  ALBUMIN 3.6   CBC: Recent Labs  Lab 06/08/18 1613 06/09/18 0509  WBC 8.8 10.6*  NEUTROABS 6.0  --   HGB 11.8* 12.0  HCT 40.2 41.8  MCV 89.5 90.7  PLT 184 170   Cardiac Enzymes: Recent Labs  Lab 06/08/18 1613  TROPONINI <0.03   BNP: BNP (last 3 results) Recent Labs    07/09/17 0602 06/08/18 1613  BNP 59.0 96.0    Signed:  Vassie Loll MD.  [facility] 06/09/2018, 3:35 PM

## 2018-06-09 NOTE — Care Management Note (Signed)
Case Management Note  Patient Details  Name: Laban EmperorSylvia A Severe MRN: [id number] Date of Birth: 04/04/1946  Subjective/Objective:    Admitted with bronchitis. Has obesity hypoventilation syndrome and presume OSA. Needs oxygen at DC. Pt has chosen [facility] from [employer] list of DME providers.                 Action/Plan: DC home today with home oxygen. [facility], who will deliver portable O2 to pt room prior to DC.   Expected Discharge Date:       06/09/2018           Expected Discharge Plan:  Home/Self Care  In-House Referral:  NA  Discharge planning Services  CM Consult  Post Acute Care Choice:  Durable Medical Equipment Choice offered to:  Patient  DME Arranged:  Oxygen DME Agency:  [facility]  [facility]     Status of Service:  Completed, signed off  If discussed at Long Length of Stay Meetings, dates discussed:    Additional Comments:  Malcolm MetroChildress, Assyria Morreale Demske, RN 06/09/2018, 2:59 PM

## 2018-06-09 NOTE — Progress Notes (Signed)
SATURATION QUALIFICATIONS: (This note is used to comply with regulatory documentation for home oxygen)  Patient Saturations on Room Air at Rest = 92%  Patient Saturations on Room Air while Ambulating = 79%  Patient Saturations on 2Liters of oxygen while Ambulating = 97%  Please briefly explain why patient needs home oxygen: Patient oxygen saturation decreases without oxygen when ambulating

## 2018-06-09 NOTE — Progress Notes (Signed)
Patient discharged home today per MD orders. Patient vital signs WDL. IV removed and site WDL. Discharge Instructions including follow up appointments, medications, and education reviewed with patient. Patient verbalizes understanding. Patient is transported out via wheelchair.  

## 2018-06-10 ENCOUNTER — Telehealth: Payer: Self-pay

## 2018-06-10 LAB — HIV ANTIBODY (ROUTINE TESTING W REFLEX): HIV Screen 4th Generation wRfx: NONREACTIVE

## 2018-06-10 NOTE — Telephone Encounter (Signed)
Transition Care Management Follow-up Telephone Call   Date discharged? 06-09-18               How have you been since you were released from the hospital? I am better than I was, can breath better    Do you understand why you were in the hospital? Yes    Do you understand the discharge instructions? Yes I do     Where were you discharged to? Home    Items Reviewed:  Medications reviewed: Yes   Allergies reviewed: Yes   Dietary changes reviewed: low calorie, heart healthy  Referrals reviewed: Yes   Functional Questionnaire:  Activities of Daily Living (ADLs):   Can perform, but has help   Any transportation issues/concerns?: None    Any patient concerns? Want to know if I will have to have oxygen forever?    Confirmed importance and date/time of follow-up visits scheduled Appointment on Jan, 28, 2020, for a CPE, can also do TOC at that time    Confirmed with patient if condition begins to worsen call PCP or go to the ER.  Patient was given the office number and encouraged to call back with question or concerns.  :

## 2018-06-11 DIAGNOSIS — Z4789 Encounter for other orthopedic aftercare: Secondary | ICD-10-CM | POA: Diagnosis not present

## 2018-06-11 DIAGNOSIS — E662 Morbid (severe) obesity with alveolar hypoventilation: Secondary | ICD-10-CM | POA: Diagnosis not present

## 2018-06-11 DIAGNOSIS — Z96659 Presence of unspecified artificial knee joint: Secondary | ICD-10-CM | POA: Diagnosis not present

## 2018-06-16 ENCOUNTER — Ambulatory Visit: Payer: Self-pay | Admitting: Urology

## 2018-06-22 ENCOUNTER — Ambulatory Visit: Payer: Medicare HMO | Admitting: Orthopedic Surgery

## 2018-06-23 ENCOUNTER — Ambulatory Visit (INDEPENDENT_AMBULATORY_CARE_PROVIDER_SITE_OTHER): Payer: Medicare HMO | Admitting: Family Medicine

## 2018-06-23 ENCOUNTER — Encounter: Payer: Self-pay | Admitting: Family Medicine

## 2018-06-23 VITALS — BP 110/62 | HR 86 | Resp 15 | Ht 62.0 in | Wt 244.0 lb

## 2018-06-23 DIAGNOSIS — R0902 Hypoxemia: Secondary | ICD-10-CM | POA: Diagnosis not present

## 2018-06-23 DIAGNOSIS — Z1231 Encounter for screening mammogram for malignant neoplasm of breast: Secondary | ICD-10-CM

## 2018-06-23 DIAGNOSIS — R42 Dizziness and giddiness: Secondary | ICD-10-CM

## 2018-06-23 DIAGNOSIS — Z780 Asymptomatic menopausal state: Secondary | ICD-10-CM

## 2018-06-23 DIAGNOSIS — Z0000 Encounter for general adult medical examination without abnormal findings: Secondary | ICD-10-CM | POA: Diagnosis not present

## 2018-06-23 DIAGNOSIS — G4733 Obstructive sleep apnea (adult) (pediatric): Secondary | ICD-10-CM

## 2018-06-23 DIAGNOSIS — Z09 Encounter for follow-up examination after completed treatment for conditions other than malignant neoplasm: Secondary | ICD-10-CM

## 2018-06-23 DIAGNOSIS — I10 Essential (primary) hypertension: Secondary | ICD-10-CM

## 2018-06-23 DIAGNOSIS — R0989 Other specified symptoms and signs involving the circulatory and respiratory systems: Secondary | ICD-10-CM

## 2018-06-23 LAB — BASIC METABOLIC PANEL WITH GFR
BUN: 10 mg/dL (ref 7–25)
CO2: 37 mmol/L — ABNORMAL HIGH (ref 20–32)
CREATININE: 0.67 mg/dL (ref 0.60–0.93)
Calcium: 8.9 mg/dL (ref 8.6–10.4)
Chloride: 101 mmol/L (ref 98–110)
GFR, Est African American: 103 mL/min/{1.73_m2} (ref >=60)
GFR, Est Non African American: 89 mL/min/{1.73_m2} (ref >=60)
GLUCOSE: 99 mg/dL (ref 65–139)
Potassium: 4 mmol/L (ref 3.5–5.3)
Sodium: 143 mmol/L (ref 135–146)

## 2018-06-23 NOTE — Patient Instructions (Signed)
F/U in 4 months, call if you need me sooner  Pls sched mammogram for March and dexa asap  Continue current BP medication  Chem 7 and EGFR today  Nurse will get you up and walking in the office and test your oxygen level, you need to stay on oxygen all the time I believe  You are being referred to Dr Luan Pulling for evaluation for sleep apnea, and also to further test you and see why your oxygen is low  Nurse will enter request for Dexa,past due, and Mammogram due in [place] these will be scheduled at checkout

## 2018-06-23 NOTE — Progress Notes (Signed)
    Patricia Fuentes     MRN: [id number]      DOB: 20-Jul-1945  HPI: Patient is in for annual physical exam. Transitional visit done, also, hospitalized from 1/13 to 06/09/2018 with hypoxia, manifesting as light headedness. She was discharged on oxygen and I does not have it with her, uses as needed, Oxygen at rest is 90% and with ambulation she de sats to 84% and this corrects  To 95% on 2 liters of oxygen She has   had recurrent light headededness, since discharge, her hospital course is reviewed , she does need oxygen continually and will need further evaluation by pulmonary and possibly cardiology, needs sleep study   Immunization is reviewed , and  updated if needed.   PE: BP 110/62   Pulse 86   Resp 15   Ht 5\' 2"  (1.575 m)   Wt 244 lb (110.7 kg)   SpO2 90%   BMI 44.63 kg/m   Pleasant  female, alert and oriented x 3, in moderate cardio-pulmonary distress. Afebrile. HEENT No facial trauma or asymetry. Sinuses non tender.  Extra occullar muscles intact, pupils equally reactive to light. External ears normal, tympanic membranes clear. Oropharynx moist, no exudate. Neck: supple, no adenopathy,JVD or thyromegaly.No Bruit present.  Chest: Clear to ascultation bilaterally.No crackles or wheezes.decreased air entry throughout Non tender to palpation  Breast: No asymetry,no masses or lumps. No tenderness. No nipple discharge or inversion. No axillary or supraclavicular adenopathy  Cardiovascular system; Heart sounds normal,  S1 and  S2 ,no S3.  No murmur, or thrill. Apical beat not displaced Peripheral pulses normal.  Abdomen: Soft, non tender, no organomegaly or masses. No bruits. Bowel sounds normal. No guarding, tenderness or rebound.    Musculoskeletal exam: Full ROM of spine, hips , shoulders and knees. No deformity ,swelling or crepitus noted. No muscle wasting or atrophy.   Neurologic: Cranial nerves 2 to 12 intact. Power, tone ,sensation and reflexes normal  throughout. No disturbance in gait. No tremor.  Skin: Intact, no ulceration, erythema , scaling or rash noted. Pigmentation normal throughout  Psych; Normal mood and affect. Judgement and concentration normal   Assessment & Plan:  Annual physical exam Annual exam as documented. Counseling done  re healthy lifestyle involving commitment to 150 minutes exercise per week, heart healthy diet, and attaining healthy weight.The importance of adequate sleep also discussed. Regular seat belt use and home safety, is also discussed. Changes in health habits are decided on by the patient with goals and time frames  set for achieving them. Immunization and cancer screening needs are specifically addressed at this visit.   Hospital discharge follow-up Patient in for follow up of recent hospitalization. Discharge summary, and laboratory and radiology data are reviewed, and any questions or concerns about recent hospitalization are discussed. Specific issues requiring follow up are specifically addressed.   Hypoxia Persistent hypoxia, demonstrating need for continuous oxygen at 2 L/min. At rest without oxygen pulse ox 90%, desat to 84% with ambulation, recovered to 95% with 2 L/ min oxygen, needs supplemental oxygen 24/7 Refer to pulmonary for further evaluation  Light headedness Cardiology to eval pt with new dx of light headedness ,recurrent , and hypoxia, carotid doppler ordered also  Obstructive sleep apnea Needs evaluation and treatment , referred to Pulmonary

## 2018-06-24 ENCOUNTER — Telehealth: Payer: Self-pay

## 2018-06-24 ENCOUNTER — Encounter: Payer: Self-pay | Admitting: Family Medicine

## 2018-06-24 DIAGNOSIS — Z09 Encounter for follow-up examination after completed treatment for conditions other than malignant neoplasm: Secondary | ICD-10-CM | POA: Insufficient documentation

## 2018-06-24 DIAGNOSIS — R42 Dizziness and giddiness: Secondary | ICD-10-CM | POA: Insufficient documentation

## 2018-06-24 DIAGNOSIS — R0902 Hypoxemia: Secondary | ICD-10-CM

## 2018-06-24 NOTE — Assessment & Plan Note (Signed)

## 2018-06-24 NOTE — Assessment & Plan Note (Addendum)
Cardiology to eval pt with new dx of light headedness ,recurrent , and hypoxia, carotid doppler ordered also

## 2018-06-24 NOTE — Assessment & Plan Note (Signed)
Needs evaluation and treatment , referred to Pulmonary

## 2018-06-24 NOTE — Assessment & Plan Note (Addendum)
Persistent hypoxia, demonstrating need for continuous oxygen at 2 L/min. At rest without oxygen pulse ox 90%, desat to 84% with ambulation, recovered to 95% with 2 L/ min oxygen, needs supplemental oxygen 24/7 Refer to pulmonary for further evaluation

## 2018-06-24 NOTE — Telephone Encounter (Signed)
DME order entered for oxygen for home use

## 2018-06-24 NOTE — Assessment & Plan Note (Signed)
Patient in for follow up of recent hospitalization. Discharge summary, and laboratory and radiology data are reviewed, and any questions or concerns about recent hospitalization are discussed. Specific issues requiring follow up are specifically addressed.  

## 2018-06-25 ENCOUNTER — Telehealth: Payer: Self-pay | Admitting: *Deleted

## 2018-06-25 DIAGNOSIS — H25812 Combined forms of age-related cataract, left eye: Secondary | ICD-10-CM | POA: Diagnosis not present

## 2018-06-25 NOTE — Telephone Encounter (Signed)
Patient has oxygen and is set up in the home with tanks to fill her portable tank to take with her when she needs to go out and about.

## 2018-06-25 NOTE — Telephone Encounter (Signed)
Daria with [facility] called she received an order for oxygen for the patient. When she reached out to the patient Patricia Fuentes stated she already had oxygen. She was calling to get further clarification on this order. She can be reached at [phone] ext [phone].

## 2018-06-26 ENCOUNTER — Ambulatory Visit (INDEPENDENT_AMBULATORY_CARE_PROVIDER_SITE_OTHER): Payer: Medicare HMO | Admitting: Cardiovascular Disease

## 2018-06-26 ENCOUNTER — Encounter: Payer: Self-pay | Admitting: Cardiovascular Disease

## 2018-06-26 VITALS — BP 144/76 | HR 85 | Ht 62.0 in | Wt 243.0 lb

## 2018-06-26 DIAGNOSIS — R011 Cardiac murmur, unspecified: Secondary | ICD-10-CM

## 2018-06-26 DIAGNOSIS — I10 Essential (primary) hypertension: Secondary | ICD-10-CM

## 2018-06-26 DIAGNOSIS — R0902 Hypoxemia: Secondary | ICD-10-CM | POA: Diagnosis not present

## 2018-06-26 DIAGNOSIS — G4733 Obstructive sleep apnea (adult) (pediatric): Secondary | ICD-10-CM | POA: Diagnosis not present

## 2018-06-26 NOTE — Progress Notes (Signed)
CARDIOLOGY CONSULT NOTE  Patient ID: Patricia Fuentes MRN: [id number] DOB/AGE: 10/27/1945 73 y.o.  Admit date: (Not on file) Primary Physician: Kerri Perches, MD Referring Physician: Kerri Perches, MD  Reason for Consultation: Lightheadedness and hypoxia  HPI: Patricia Fuentes is a 73 y.o. female who is being seen today for the evaluation of lightheadedness and hypoxia at the request of Kerri Perches, MD.  She was last seen by Dr. Diona Browner in May 2016.  Past medical history includes hypertension and hyperlipidemia.  Due to an abnormal nuclear stress test, she underwent coronary angiography on 10/25/2014 which showed no angiographic evidence of coronary artery disease and normal left ventricular systolic function.  She was recently hospitalized for hypoxia in the setting of obesity hypoventilation syndrome, obstructive sleep apnea and acute reactive airway disease from bronchitis.  She had a good response to oxygen, antibiotics, and an albuterol nebulizer.  The importance of weight loss was emphasized at the time of discharge and an outpatient sleep study was recommended.  She was discharged on 06/09/2018 with a 5-day course of Zithromax along with Flonase and an albuterol inhaler.  I personally reviewed the ECG performed on 06/08/2018 which showed sinus rhythm with right axis deviation and nonspecific T wave abnormalities.  CXR showed no active cardiopulmonary disease.  She denies chest pain, palpitations, exertional dyspnea, orthopnea, and paroxysmal nocturnal dyspnea.  She has been using oxygen "as needed ".  She said she becomes hypoxic with O2 saturations in the 70% range with any significant exertion.  She wakes up in the morning and her oxygen is low.  She has had chronic bilateral leg swelling.  She has a history of bilateral knee replacement surgery.  Upon further discussion, it appears she had apneic episodes while undergoing knee surgery.  Her  husband has also witnessed apneic episodes.    Allergies  Allergen Reactions  . Promethazine Hcl Nausea And Vomiting and Other (See Comments)    dizzy    Current Outpatient Medications  Medication Sig Dispense Refill  . albuterol (PROVENTIL HFA;VENTOLIN HFA) 108 (90 Base) MCG/ACT inhaler Inhale 1-2 puffs into the lungs every 6 (six) hours as needed for wheezing or shortness of breath. 1 Inhaler 2  . amLODipine (NORVASC) 5 MG tablet TAKE ONE TABLET BY MOUTH DAILY. (Patient taking differently: Take 5 mg by mouth every morning. ) 30 tablet 5  . aspirin EC 81 MG tablet Take 162 mg by mouth every morning.     . fluticasone (FLONASE) 50 MCG/ACT nasal spray Place 1 spray into both nostrils daily. 16 g 1  . OXYGEN Inhale 2 L into the lungs at bedtime as needed.     No current facility-administered medications for this visit.     Past Medical History:  Diagnosis Date  . Bronchitis   . Chronic back pain   . Essential hypertension   . Hyperlipidemia   . IGT (impaired glucose tolerance) 2015  . Obesity, unspecified   . Osteoarthritis   . Seasonal allergies     Past Surgical History:  Procedure Laterality Date  . CARDIAC CATHETERIZATION N/A 10/25/2014   Procedure: Left Heart Cath and Coronary Angiography;  Surgeon: Kathleene Hazel, MD;  Location: [facility] INVASIVE CV LAB;  Service: Cardiovascular;  Laterality: N/A;  . COLONOSCOPY N/A 02/09/2016   Procedure: COLONOSCOPY;  Surgeon: West Bali, MD;  Location: [facility] ENDO SUITE;  Service: Endoscopy;  Laterality: N/A;  12:30 PM  . KNEE ARTHROSCOPY Left   .  TOTAL ABDOMINAL HYSTERECTOMY  1995 approx   Fibroids  . TOTAL KNEE ARTHROPLASTY Right 2011  . TOTAL KNEE ARTHROPLASTY Left 04/29/2017   Procedure: TOTAL KNEE ARTHROPLASTY;  Surgeon: Vickki HearingHarrison, Stanley E, MD;  Location: [facility] ORS;  Service: Orthopedics;  Laterality: Left;    Social History   Socioeconomic History  . Marital status: Married    Spouse name: Not on file  . Number of  children: Not on file  . Years of education: Not on file  . Highest education level: Not on file  Occupational History  . Occupation: retired    Associate Professormployer: UNEMPLOYED  Social Needs  . Financial resource strain: Not hard at all  . Food insecurity:    Worry: Never true    Inability: Never true  . Transportation needs:    Medical: No    Non-medical: No  Tobacco Use  . Smoking status: Never Smoker  . Smokeless tobacco: Never Used  Substance and Sexual Activity  . Alcohol use: No  . Drug use: No  . Sexual activity: Yes    Birth control/protection: Surgical  Lifestyle  . Physical activity:    Days per week: 0 days    Minutes per session: 0 min  . Stress: Not at all  Relationships  . Social connections:    Talks on phone: More than three times a week    Gets together: More than three times a week    Attends religious service: More than 4 times per year    Active member of club or organization: No    Attends meetings of clubs or organizations: Never    Relationship status: Married  . Intimate partner violence:    Fear of current or ex partner: No    Emotionally abused: No    Physically abused: No    Forced sexual activity: No  Other Topics Concern  . Not on file  Social History Narrative  . Not on file     No family history of premature CAD in 1st degree relatives.  Current Meds  Medication Sig  . albuterol (PROVENTIL HFA;VENTOLIN HFA) 108 (90 Base) MCG/ACT inhaler Inhale 1-2 puffs into the lungs every 6 (six) hours as needed for wheezing or shortness of breath.  Marland Kitchen. amLODipine (NORVASC) 5 MG tablet TAKE ONE TABLET BY MOUTH DAILY. (Patient taking differently: Take 5 mg by mouth every morning. )  . aspirin EC 81 MG tablet Take 162 mg by mouth every morning.   . fluticasone (FLONASE) 50 MCG/ACT nasal spray Place 1 spray into both nostrils daily.  . OXYGEN Inhale 2 L into the lungs at bedtime as needed.      Review of systems complete and found to be negative unless  listed above in HPI    Physical exam Blood pressure (!) 144/76, pulse 85, height 5\' 2"  (1.575 m), weight 243 lb (110.2 kg), SpO2 93 %. General: NAD Neck: No JVD, no thyromegaly or thyroid nodule.  Lungs: Clear to auscultation bilaterally with normal respiratory effort. CV: Nondisplaced PMI. Regular rate and rhythm, normal S1/S2, no S3/S4, 1/6 systolic murmur over bilateral upper sternal borders.  Trace bilateral lower extremity edema.   Abdomen: Soft, nontender, obese.  Skin: Intact without lesions or rashes.  Neurologic: Alert and oriented x 3.  Psych: Normal affect. Extremities: No clubbing or cyanosis.  HEENT: Normal.   ECG: Most recent ECG reviewed.   Labs: Lab Results  Component Value Date/Time   K 4.0 06/23/2018 10:58 AM   BUN 10 06/23/2018 10:58  AM   CREATININE 0.67 06/23/2018 10:58 AM   ALT 10 06/08/2018 04:13 PM   TSH 1.160 04/30/2018 01:00 PM   TSH 1.72 02/11/2017 02:23 PM   HGB 12.0 06/09/2018 05:09 AM     Lipids: Lab Results  Component Value Date/Time   LDLCALC 96 04/30/2018 01:00 PM   LDLCALC 92 02/11/2017 02:23 PM   CHOL 164 04/30/2018 01:00 PM   TRIG 47 04/30/2018 01:00 PM   HDL 59 04/30/2018 01:00 PM        ASSESSMENT AND PLAN:  1.  Dizziness and hypoxia: Coronary angiography was normal in May 2016 as detailed above.  As reviewed above, she was recently hospitalized for hypoxia deemed secondary to obesity hypoventilation syndrome, obstructive sleep apnea and acute reactive airway disease from bronchitis. She is morbidly obese.  O2 saturations reportedly dropped to the 70% range with any significant exertion and when she wakes up.  I suspect she indeed has obesity hypoventilation syndrome as well as obstructive sleep apnea.  I will make a referral for a sleep study.  I suspect bilateral leg edema is due to morbid obesity and abdominal venous compression. I will order a 2-D echocardiogram with Doppler to evaluate cardiac structure, function, and regional  wall motion.  2.  Hypertension: Blood pressure is mildly elevated.  She needs weight loss and treatment for likely obstructive sleep apnea.  3.  Cardiac murmur: Given her morbid obesity and likely obstructive sleep apnea, she probably has some degree of tricuspid regurgitation. I will order a 2-D echocardiogram with Doppler to evaluate cardiac structure, function, and regional wall motion.  4.  Hypoxia/apneic episodes/obstructive sleep apnea: Given her morbid obesity and hypoxia with witnessed apneic episodes, she very likely has obstructive sleep apnea.  In order to document this, I will make a referral for a sleep study as well as for subsequent management.  I reinforced the importance of this and also informed her that obstructive sleep apnea is an independent risk factor for cardiac arrhythmias, CVA, and MI.  5.  Morbid obesity: She need significant weight loss.     Disposition: Follow up in 3 months  Signed: Prentice DockerSuresh Mariette Cowley, M.D., F.A.C.C.  06/26/2018, 1:11 PM

## 2018-06-26 NOTE — Patient Instructions (Signed)
Medication Instructions:  Your physician recommends that you continue on your current medications as directed. Please refer to the Current Medication list given to you today.  If you need a refill on your cardiac medications before your next appointment, please call your pharmacy.   Lab work: None If you have labs (blood work) drawn today and your tests are completely normal, you will receive your results only by: Marland Kitchen MyChart Message (if you have MyChart) OR . A paper copy in the mail If you have any lab test that is abnormal or we need to change your treatment, we will call you to review the results.  Testing/Procedures: Your physician has requested that you have an echocardiogram. Echocardiography is a painless test that uses sound waves to create images of your heart. It provides your doctor with information about the size and shape of your heart and how well your heart's chambers and valves are working. This procedure takes approximately one hour. There are no restrictions for this procedure.    Follow-Up: At [facility], you and your health needs are our priority.  As part of our continuing mission to provide you with exceptional heart care, we have created designated Provider Care Teams.  These Care Teams include your primary Cardiologist (physician) and Advanced Practice Providers (APPs -  Physician Assistants and Nurse Practitioners) who all work together to provide you with the care you need, when you need it. You will need a follow up appointment in 3 months.  Please call our office 2 months in advance to schedule this appointment.  You may see Prentice Docker, MD or one of the following Advanced Practice Providers on your designated Care Team:   Randall An, PA-C [facility]) . Jacolyn Reedy, PA-C [facility])  Any Other Special Instructions Will Be Listed Below (If Applicable).  USE YOUR OXYGEN EVERY NIGHT    You are referred to [facility] for  sleep apnea testing. They will call you with apt

## 2018-07-09 ENCOUNTER — Ambulatory Visit (HOSPITAL_BASED_OUTPATIENT_CLINIC_OR_DEPARTMENT_OTHER)
Admission: RE | Admit: 2018-07-09 | Discharge: 2018-07-09 | Disposition: A | Discharge status: Home / Self Care | Payer: Medicare HMO | Source: Ambulatory Visit | Attending: Cardiovascular Disease | Admitting: Cardiovascular Disease

## 2018-07-09 ENCOUNTER — Encounter: Payer: Self-pay | Admitting: *Deleted

## 2018-07-09 ENCOUNTER — Ambulatory Visit (HOSPITAL_COMMUNITY)
Admission: RE | Admit: 2018-07-09 | Discharge: 2018-07-09 | Disposition: A | Discharge status: Home / Self Care | Payer: Medicare HMO | Source: Ambulatory Visit | Attending: Family Medicine | Admitting: Family Medicine

## 2018-07-09 DIAGNOSIS — R011 Cardiac murmur, unspecified: Secondary | ICD-10-CM | POA: Diagnosis not present

## 2018-07-09 DIAGNOSIS — Z780 Asymptomatic menopausal state: Secondary | ICD-10-CM

## 2018-07-09 DIAGNOSIS — E2839 Other primary ovarian failure: Secondary | ICD-10-CM | POA: Diagnosis not present

## 2018-07-09 NOTE — Progress Notes (Signed)
*  PRELIMINARY RESULTS* Echocardiogram 2D Echocardiogram has been performed.  Patricia Fuentes 07/09/2018, 11:56 AM

## 2018-07-12 DIAGNOSIS — Z4789 Encounter for other orthopedic aftercare: Secondary | ICD-10-CM | POA: Diagnosis not present

## 2018-07-12 DIAGNOSIS — E662 Morbid (severe) obesity with alveolar hypoventilation: Secondary | ICD-10-CM | POA: Diagnosis not present

## 2018-07-12 DIAGNOSIS — Z96659 Presence of unspecified artificial knee joint: Secondary | ICD-10-CM | POA: Diagnosis not present

## 2018-07-13 ENCOUNTER — Encounter: Payer: Self-pay | Admitting: Orthopedic Surgery

## 2018-07-13 ENCOUNTER — Ambulatory Visit (INDEPENDENT_AMBULATORY_CARE_PROVIDER_SITE_OTHER): Payer: Medicare HMO | Admitting: Orthopedic Surgery

## 2018-07-13 ENCOUNTER — Ambulatory Visit (INDEPENDENT_AMBULATORY_CARE_PROVIDER_SITE_OTHER): Payer: Medicare HMO

## 2018-07-13 VITALS — BP 142/73 | HR 75 | Ht 62.0 in | Wt 240.0 lb

## 2018-07-13 DIAGNOSIS — G600 Hereditary motor and sensory neuropathy: Secondary | ICD-10-CM | POA: Diagnosis not present

## 2018-07-13 DIAGNOSIS — Z96652 Presence of left artificial knee joint: Secondary | ICD-10-CM

## 2018-07-13 DIAGNOSIS — G4733 Obstructive sleep apnea (adult) (pediatric): Secondary | ICD-10-CM | POA: Diagnosis not present

## 2018-07-13 DIAGNOSIS — I10 Essential (primary) hypertension: Secondary | ICD-10-CM | POA: Diagnosis not present

## 2018-07-13 DIAGNOSIS — R0902 Hypoxemia: Secondary | ICD-10-CM | POA: Diagnosis not present

## 2018-07-13 NOTE — Progress Notes (Signed)
ANNUAL FOLLOW UP FOR  LEFT TKA   Chief Complaint  Patient presents with  . Knee Problem    04/29/17     HPI: The patient is here for the annual  follow-up x-ray for knee replacement. The patient is not complaining of pain weakness instability or stiffness in the repaired knee.   Review of Systems  Musculoskeletal:       Right knee no problems at present    Past Medical History:  Diagnosis Date  . Bronchitis   . Chronic back pain   . Essential hypertension   . Hyperlipidemia   . IGT (impaired glucose tolerance) 2015  . Obesity, unspecified   . Osteoarthritis   . Seasonal allergies      Examination of the LEFT KNEE  BP (!) 142/73   Pulse 75   Ht 5\' 2"  (1.575 m)   Wt 240 lb (108.9 kg)   BMI 43.90 kg/m   General the patient is normally groomed in no distress  Mood normal Affect pleasant   The patient is Awake and alert ; oriented normal   Inspection shows : incision healed nicely without erythema, no tenderness no swelling  Range of motion total range of motion is 110  Stability the knee is stable anterior to posterior as well as medial to lateral  Strength quadriceps strength is normal  Skin no erythema around the skin incision  Cardiovascular NO EDEMA   Neuro: normal sensation in the operative leg   Gait: normal expected gait without cane    Medical decision-making section  X-rays ordered with the following personal interpretation  Normal alignment without loosening   Diagnosis  Encounter Diagnosis  Name Primary?  . S/P total knee arthroplasty, left 04/29/17 Yes     Plan follow-up 1 year repeat x-rays

## 2018-07-14 ENCOUNTER — Other Ambulatory Visit (HOSPITAL_COMMUNITY): Payer: Self-pay | Admitting: Respiratory Therapy

## 2018-07-14 DIAGNOSIS — J984 Other disorders of lung: Secondary | ICD-10-CM

## 2018-07-17 DIAGNOSIS — H2512 Age-related nuclear cataract, left eye: Secondary | ICD-10-CM | POA: Diagnosis not present

## 2018-07-17 DIAGNOSIS — H25812 Combined forms of age-related cataract, left eye: Secondary | ICD-10-CM | POA: Diagnosis not present

## 2018-08-06 ENCOUNTER — Institutional Professional Consult (permissible substitution): Payer: Medicare HMO | Admitting: Neurology

## 2018-08-10 DIAGNOSIS — Z96659 Presence of unspecified artificial knee joint: Secondary | ICD-10-CM | POA: Diagnosis not present

## 2018-08-10 DIAGNOSIS — E662 Morbid (severe) obesity with alveolar hypoventilation: Secondary | ICD-10-CM | POA: Diagnosis not present

## 2018-08-10 DIAGNOSIS — Z4789 Encounter for other orthopedic aftercare: Secondary | ICD-10-CM | POA: Diagnosis not present

## 2018-08-12 ENCOUNTER — Encounter (HOSPITAL_COMMUNITY): Payer: Self-pay

## 2018-08-12 ENCOUNTER — Ambulatory Visit (HOSPITAL_COMMUNITY): Payer: Self-pay

## 2018-08-24 ENCOUNTER — Other Ambulatory Visit: Payer: Self-pay | Admitting: Family Medicine

## 2018-08-24 DIAGNOSIS — J449 Chronic obstructive pulmonary disease, unspecified: Secondary | ICD-10-CM | POA: Diagnosis not present

## 2018-08-24 DIAGNOSIS — I10 Essential (primary) hypertension: Secondary | ICD-10-CM | POA: Diagnosis not present

## 2018-08-24 DIAGNOSIS — J9611 Chronic respiratory failure with hypoxia: Secondary | ICD-10-CM | POA: Diagnosis not present

## 2018-09-03 ENCOUNTER — Other Ambulatory Visit: Payer: Self-pay

## 2018-09-03 ENCOUNTER — Ambulatory Visit (INDEPENDENT_AMBULATORY_CARE_PROVIDER_SITE_OTHER): Payer: Medicare HMO | Admitting: Family Medicine

## 2018-09-03 ENCOUNTER — Encounter: Payer: Self-pay | Admitting: Family Medicine

## 2018-09-03 VITALS — BP 133/72

## 2018-09-03 DIAGNOSIS — Z0000 Encounter for general adult medical examination without abnormal findings: Secondary | ICD-10-CM | POA: Diagnosis not present

## 2018-09-03 NOTE — Patient Instructions (Signed)
Thank you for completing your annual wellness visit via telemedicine today.  I appreciate the opportunity to provide you with the care for your health and wellness. Today we discussed: Preventative care and screenings.  I have included some preventative information for you to review.  Please do not hesitate to reach out if you need [place] for anything during this time.  Stay home and stay safe.  WASH YOUR HANDS WELL AND FREQUENTLY. AVOID TOUCHING YOUR FACE, UNLESS YOUR HANDS ARE FRESHLY WASHED.  GET FRESH AIR DAILY. STAY HYDRATED WITH WATER.   It was a pleasure to see you and I look forward to continuing to work together on your health and well-being. Please do not hesitate to call the office if you need care or have questions about your care.  Have a wonderful day and week. With Gratitude, Tereasa Coop, DNP, AGNP-BC   [name] , Thank you for taking time to come for your Medicare Wellness Visit. I appreciate your ongoing commitment to your health goals. Please review the following plan we discussed and let me know if I can assist you in the future.    Preventive Care 22 Years and Older, Female Preventive care refers to lifestyle choices and visits with your health care provider that can promote health and wellness. What does preventive care include?  A yearly physical exam. This is also called an annual well check.  Dental exams once or twice a year.  Routine eye exams. Ask your health care provider how often you should have your eyes checked.  Personal lifestyle choices, including:  Daily care of your teeth and gums.  Regular physical activity.  Eating a healthy diet.  Avoiding tobacco and drug use.  Limiting alcohol use.  Practicing safe sex.  Taking low-dose aspirin every day.  Taking vitamin and mineral supplements as recommended by your health care provider. What happens during an annual well check? The services and screenings done by your health care  provider during your annual well check will depend on your age, overall health, lifestyle risk factors, and family history of disease. Counseling  Your health care provider may ask you questions about your:  Alcohol use.  Tobacco use.  Drug use.  Emotional well-being.  Home and relationship well-being.  Sexual activity.  Eating habits.  History of falls.  Memory and ability to understand (cognition).  Work and work Astronomer.  Reproductive health. Screening  You may have the following tests or measurements:  Height, weight, and BMI.  Blood pressure.  Lipid and cholesterol levels. These may be checked every 5 years, or more frequently if you are over 67 years old.  Skin check.  Lung cancer screening. You may have this screening every year starting at age 63 if you have a 30-pack-year history of smoking and currently smoke or have quit within the past 15 years.  Fecal occult blood test (FOBT) of the stool. You may have this test every year starting at age 3.  Flexible sigmoidoscopy or colonoscopy. You may have a sigmoidoscopy every 5 years or a colonoscopy every 10 years starting at age 5.  Hepatitis C blood test.  Hepatitis B blood test.  Sexually transmitted disease (STD) testing.  Diabetes screening. This is done by checking your blood sugar (glucose) after you have not eaten for a while (fasting). You may have this done every 1-3 years.  Bone density scan. This is done to screen for osteoporosis. You may have this done starting at age 11.  Mammogram. This may be done every 1-2 years. Talk to your health care provider about how often you should have regular mammograms. Talk with your health care provider about your test results, treatment options, and if necessary, the need for more tests. Vaccines  Your health care provider may recommend certain vaccines, such as:  Influenza vaccine. This is recommended every year.  Tetanus, diphtheria, and acellular  pertussis (Tdap, Td) vaccine. You may need a Td booster every 10 years.  Zoster vaccine. You may need this after age 71.  Pneumococcal 13-valent conjugate (PCV13) vaccine. One dose is recommended after age 71.  Pneumococcal polysaccharide (PPSV23) vaccine. One dose is recommended after age 71. Talk to your health care provider about which screenings and vaccines you need and how often you need them. This information is not intended to replace advice given to you by your health care provider. Make sure you discuss any questions you have with your health care provider. Document Released: 06/09/2015 Document Revised: 01/31/2016 Document Reviewed: 03/14/2015 Elsevier Interactive Patient Education  2017 [employer]  Fall Prevention in the Home Falls can cause injuries. They can happen to people of all ages. There are many things you can do to make your home safe and to help prevent falls. What can I do on the outside of my home?  Regularly fix the edges of walkways and driveways and fix any cracks.  Remove anything that might make you trip as you walk through a door, such as a raised step or threshold.  Trim any bushes or trees on the path to your home.  Use bright outdoor lighting.  Clear any walking paths of anything that might make someone trip, such as rocks or tools.  Regularly check to see if handrails are loose or broken. Make sure that both sides of any steps have handrails.  Any raised decks and porches should have guardrails on the edges.  Have any leaves, snow, or ice cleared regularly.  Use sand or salt on walking paths during winter.  Clean up any spills in your garage right away. This includes oil or grease spills. What can I do in the bathroom?  Use night lights.  Install grab bars by the toilet and in the tub and shower. Do not use towel bars as grab bars.  Use non-skid mats or decals in the tub or shower.  If you need to sit down in the shower, use a plastic,  non-slip stool.  Keep the floor dry. Clean up any water that spills on the floor as soon as it happens.  Remove soap buildup in the tub or shower regularly.  Attach bath mats securely with double-sided non-slip rug tape.  Do not have throw rugs and other things on the floor that can make you trip. What can I do in the bedroom?  Use night lights.  Make sure that you have a light by your bed that is easy to reach.  Do not use any sheets or blankets that are too big for your bed. They should not hang down onto the floor.  Have a firm chair that has side arms. You can use this for support while you get dressed.  Do not have throw rugs and other things on the floor that can make you trip. What can I do in the kitchen?  Clean up any spills right away.  Avoid walking on wet floors.  Keep items that you use a lot in easy-to-reach places.  If you need to reach  something above you, use a strong step stool that has a grab bar.  Keep electrical cords out of the way.  Do not use floor polish or wax that makes floors slippery. If you must use wax, use non-skid floor wax.  Do not have throw rugs and other things on the floor that can make you trip. What can I do with my stairs?  Do not leave any items on the stairs.  Make sure that there are handrails on both sides of the stairs and use them. Fix handrails that are broken or loose. Make sure that handrails are as long as the stairways.  Check any carpeting to make sure that it is firmly attached to the stairs. Fix any carpet that is loose or worn.  Avoid having throw rugs at the top or bottom of the stairs. If you do have throw rugs, attach them to the floor with carpet tape.  Make sure that you have a light switch at the top of the stairs and the bottom of the stairs. If you do not have them, ask someone to add them for you. What else can I do to help prevent falls?  Wear shoes that:  Do not have high heels.  Have rubber  bottoms.  Are comfortable and fit you well.  Are closed at the toe. Do not wear sandals.  If you use a stepladder:  Make sure that it is fully opened. Do not climb a closed stepladder.  Make sure that both sides of the stepladder are locked into place.  Ask someone to hold it for you, if possible.  Clearly mark and make sure that you can see:  Any grab bars or handrails.  First and last steps.  Where the edge of each step is.  Use tools that help you move around (mobility aids) if they are needed. These include:  Canes.  Walkers.  Scooters.  Crutches.  Turn on the lights when you go into a dark area. Replace any light bulbs as soon as they burn out.  Set up your furniture so you have a clear path. Avoid moving your furniture around.  If any of your floors are uneven, fix them.  If there are any pets around you, be aware of where they are.  Review your medicines with your doctor. Some medicines can make you feel dizzy. This can increase your chance of falling. Ask your doctor what other things that you can do to help prevent falls. This information is not intended to replace advice given to you by your health care provider. Make sure you discuss any questions you have with your health care provider. Document Released: 03/09/2009 Document Revised: 10/19/2015 Document Reviewed: 06/17/2014 Elsevier Interactive Patient Education  2017 [employer].

## 2018-09-03 NOTE — Progress Notes (Addendum)
Subjective:   Patricia Fuentes is a 73 y.o. female who presents for Medicare Annual (Subsequent) preventive examination.  Location of Patient: Home Location of Provider: Telehealth-D[facility] Consent was obtain for visit to be over via telehealth. A  telemedicine application was used and I verified that I am speaking with the correct person using two identifiers.   Past Medical, Surgical, Social History, Allergies, and Medications have been Reviewed.  Review of Systems:   Cardiac Risk Factors include: advanced age (>83men, >77 women);hypertension;obesity (BMI >30kg/m2);sedentary lifestyle     Objective:     Vitals: BP 133/72   There is no height or weight on file to calculate BMI.  Advanced Directives 09/03/2018 06/08/2018 06/08/2018 09/01/2017 07/23/2017 07/09/2017 07/05/2017  Does Patient Have a Medical Advance Directive? No No No No No No No  Type of Advance Directive - - - - - - -  Does patient want to make changes to medical advance directive? - - - - - - -  Would patient like information on creating a medical advance directive? - Yes (Inpatient - patient requests chaplain consult to create a medical advance directive) No - Patient declined Yes (MAU/Ambulatory/Procedural Areas - Information given) Yes (MAU/Ambulatory/Procedural Areas - Information given) - -    Tobacco Social History   Tobacco Use  Smoking Status Never Smoker  Smokeless Tobacco Never Used     Counseling given: Not Answered   Clinical Intake:  Pre-visit preparation completed: No  Pain : No/denies pain     Nutritional Status: BMI > 30  Obese Diabetes: No  How often do you need to have someone help you when you read instructions, pamphlets, or other written materials from your doctor or pharmacy?: 1 - Never What is the last grade level you completed in school?: 12  Interpreter Needed?: No     Past Medical History:  Diagnosis Date  . Bronchitis   . Chronic back pain   . Essential hypertension    . Hyperlipidemia   . IGT (impaired glucose tolerance) 2015  . Obesity, unspecified   . Osteoarthritis   . Seasonal allergies    Past Surgical History:  Procedure Laterality Date  . CARDIAC CATHETERIZATION N/A 10/25/2014   Procedure: Left Heart Cath and Coronary Angiography;  Surgeon: Kathleene Hazel, MD;  Location: [facility] INVASIVE CV LAB;  Service: Cardiovascular;  Laterality: N/A;  . COLONOSCOPY N/A 02/09/2016   Procedure: COLONOSCOPY;  Surgeon: West Bali, MD;  Location: [facility];  Service: Endoscopy;  Laterality: N/A;  12:30 PM  . KNEE ARTHROSCOPY Left   . TOTAL ABDOMINAL HYSTERECTOMY  1995 approx   Fibroids  . TOTAL KNEE ARTHROPLASTY Right 2011  . TOTAL KNEE ARTHROPLASTY Left 04/29/2017   Procedure: TOTAL KNEE ARTHROPLASTY;  Surgeon: Vickki Hearing, MD;  Location: [facility];  Service: Orthopedics;  Laterality: Left;   Family History  Problem Relation Age of Onset  . Hypertension Mother   . Stroke Mother 75       Deceased due CVa at 59  . Diabetes Father   . Cancer Father 28       Unknown, somewhere in stomach  . Hypertension Brother   . Hypertension Brother   . Hypertension Brother   . Cancer Maternal Grandfather        ?type  . Hypertension Son    Social History   Socioeconomic History  . Marital status: Married    Spouse name: Not on file  . Number of children: Not on file  .  Years of education: Not on file  . Highest education level: Not on file  Occupational History  . Occupation: retired    Associate Professormployer: UNEMPLOYED  Social Needs  . Financial resource strain: Not hard at all  . Food insecurity:    Worry: Never true    Inability: Never true  . Transportation needs:    Medical: No    Non-medical: No  Tobacco Use  . Smoking status: Never Smoker  . Smokeless tobacco: Never Used  Substance and Sexual Activity  . Alcohol use: No  . Drug use: No  . Sexual activity: Yes    Birth control/protection: Surgical  Lifestyle  . Physical activity:     Days per week: 0 days    Minutes per session: 0 min  . Stress: Not at all  Relationships  . Social connections:    Talks on phone: More than three times a week    Gets together: More than three times a week    Attends religious service: More than 4 times per year    Active member of club or organization: No    Attends meetings of clubs or organizations: Never    Relationship status: Married  Other Topics Concern  . Not on file  Social History Narrative  . Not on file    Outpatient Encounter Medications as of 09/03/2018  Medication Sig  . albuterol (PROVENTIL HFA;VENTOLIN HFA) 108 (90 Base) MCG/ACT inhaler Inhale 1-2 puffs into the lungs every 6 (six) hours as needed for wheezing or shortness of breath.  Marland Kitchen. amLODipine (NORVASC) 5 MG tablet TAKE ONE TABLET BY MOUTH DAILY.  Marland Kitchen. aspirin EC 81 MG tablet Take 162 mg by mouth every morning.   . fluticasone (FLONASE) 50 MCG/ACT nasal spray Place 1 spray into both nostrils daily.  . OXYGEN Inhale 2 L into the lungs at bedtime as needed.   No facility-administered encounter medications on file as of 09/03/2018.     Activities of Daily Living In your present state of health, do you have any difficulty performing the following activities: 09/03/2018 06/08/2018  Hearing? N N  Vision? N N  Difficulty concentrating or making decisions? N N  Walking or climbing stairs? Y N  Comment knee replacement and wt make stairs harder -  Dressing or bathing? N N  Doing errands, shopping? N N  Preparing Food and eating ? N -  Using the Toilet? N -  In the past six months, have you accidently leaked urine? N -  Do you have problems with loss of bowel control? N -  Managing your Medications? N -  Managing your Finances? N -  Housekeeping or managing your Housekeeping? N -  Some recent data might be hidden    Patient Care Team: Kerri PerchesSimpson, Margaret E, MD as PCP - General Laqueta LindenKoneswaran, Suresh A, MD as PCP - Cardiology (Cardiology) Vickki HearingHarrison, Stanley E, MD as  Consulting Physician (Orthopedic Surgery) West BaliFields, Sandi L, MD as Consulting Physician (Gastroenterology)    Assessment:   This is a routine wellness examination for Patricia ElmSylvia.  Exercise Activities and Dietary recommendations Current Exercise Habits: The patient does not participate in regular exercise at present, Exercise limited by: cardiac condition(s);orthopedic condition(s)  Goals    . Exercise 3x per week (30 min per time)     Recommend chair exercises at least 3 times a week for at least 30 minutes at a time as tolerated.       Fall Risk Fall Risk  09/03/2018 09/03/2018 06/23/2018 01/14/2018 09/01/2017  Falls in the past year? 0 0 0 No No  Number falls in past yr: 0 0 - - -  Injury with Fall? 0 0 - - -  Risk for fall due to : - - - - -  Follow up Falls evaluation completed;Falls prevention discussed;Education provided - - - -  Comment - - - - -   Is the patient's home free of loose throw rugs in walkways, pet beds, electrical cords, etc?   yes      Grab bars in the bathroom? yes      Handrails on the stairs?   yes      Adequate lighting?   yes  Timed Get Up and Go performed: unable to perform telephone visit  Depression Screen PHQ 2/9 Scores 09/03/2018 09/03/2018 06/23/2018 01/14/2018  PHQ - 2 Score 0 0 0 0     Cognitive Function     6CIT Screen 09/03/2018 09/01/2017 07/18/2016  What Year? 0 points 0 points 0 points  What month? 0 points 0 points 0 points  What time? 0 points 0 points 0 points  Count back from 20 0 points 0 points 0 points  Months in reverse 0 points 0 points 0 points  Repeat phrase 2 points 0 points 0 points  Total Score 2 0 0    Immunization History  Administered Date(s) Administered  . Influenza, High Dose Seasonal PF 05/05/2018  . Influenza,inj,Quad PF,6+ Mos 06/22/2013, 07/18/2014, 07/04/2015, 03/21/2016, 02/11/2017  . PPD Test 01/15/2018  . Pneumococcal Conjugate-13 12/27/2013  . Pneumococcal Polysaccharide-23 12/15/2012    Qualifies for Shingles  Vaccine? Yes   Screening Tests Health Maintenance  Topic Date Due  . TETANUS/TDAP  08/12/1966  . INFLUENZA VACCINE  12/26/2018  . MAMMOGRAM  08/08/2019  . COLONOSCOPY  02/08/2026  . DEXA SCAN  Completed  . Hepatitis C Screening  Completed  . PNA vac Low Risk Adult  Completed    Cancer Screenings: Lung: Low Dose CT Chest recommended if Age 38-80 years, 30 pack-year currently smoking OR have quit w/in 15years. Patient does not qualify. Breast:  Up to date on Mammogram? No  Rescheduled due to virus  Up to date of Bone Density/Dexa? Yes  Colorectal: 2027  Additional Screenings:  Hepatitis C Screening: completed     Plan:    1. Encounter for Medicare annual wellness exam  I have personally reviewed and noted the following in the patient's chart:   . Medical and social history . Use of alcohol, tobacco or illicit drugs  . Current medications and supplements . Functional ability and status . Nutritional status . Physical activity . Advanced directives . List of other physicians . Hospitalizations, surgeries, and ER visits in previous 12 months . Vitals . Screenings to include cognitive, depression, and falls . Referrals and appointments  In addition, I have reviewed and discussed with patient certain preventive protocols, quality metrics, and best practice recommendations. A written personalized care plan for preventive services as well as general preventive health recommendations were provided to patient.   I provided 25 minutes of non-face-to-face time during this encounter.   Freddy Finner, NP  09/03/2018

## 2018-09-08 ENCOUNTER — Telehealth: Payer: Self-pay | Admitting: Neurology

## 2018-09-08 NOTE — Telephone Encounter (Signed)
Due to current COVID 19 pandemic, our office is severely reducing in office visits, in order to minimize the risk to our patients and healthcare providers.  Pt understands that although there may be some limitations with this type of visit, we will take all precautions to reduce any security or privacy concerns.  Pt understands that this will be treated like an in office visit and we will file with pt's insurance, and there may be a patient responsible charge related to this service. Pt's email is [email or web address]. Pt understands that the [employer] webex software must be downloaded and operational on the device pt plans to use for the visit. Pt understands that the nurse will be calling to go over pt's chart.

## 2018-09-10 DIAGNOSIS — E662 Morbid (severe) obesity with alveolar hypoventilation: Secondary | ICD-10-CM | POA: Diagnosis not present

## 2018-09-10 DIAGNOSIS — Z96659 Presence of unspecified artificial knee joint: Secondary | ICD-10-CM | POA: Diagnosis not present

## 2018-09-10 DIAGNOSIS — Z4789 Encounter for other orthopedic aftercare: Secondary | ICD-10-CM | POA: Diagnosis not present

## 2018-09-10 NOTE — Telephone Encounter (Signed)
Called the patient to review her chart with her to prepare for her visit. No answer. LVM informing the patient to call back.

## 2018-09-15 NOTE — Telephone Encounter (Signed)
Called the patient to attempt to review her chart with her. No answer. LVM again.

## 2018-09-16 ENCOUNTER — Encounter: Payer: Self-pay | Admitting: Neurology

## 2018-09-16 NOTE — Telephone Encounter (Signed)
Called the patient back and we will complete a test run for the visit to prepare for tomorrow

## 2018-09-16 NOTE — Telephone Encounter (Signed)
Patient called back and I reviewed the chart with her. Made sure it was up to date. Advised the patient about making sure to download the app and go into the apt by the e-mail. Reviewed with her the sleep scale I would send and advised the patient to complete and send back to me prior to the apt.

## 2018-09-16 NOTE — Telephone Encounter (Signed)
Pt is calling in requesting a call back

## 2018-09-17 ENCOUNTER — Other Ambulatory Visit: Payer: Self-pay

## 2018-09-17 ENCOUNTER — Telehealth: Payer: Self-pay | Admitting: Neurology

## 2018-09-17 ENCOUNTER — Ambulatory Visit (INDEPENDENT_AMBULATORY_CARE_PROVIDER_SITE_OTHER): Payer: Medicare HMO | Admitting: Neurology

## 2018-09-17 VITALS — Ht 62.4 in | Wt 230.0 lb

## 2018-09-17 DIAGNOSIS — R0683 Snoring: Secondary | ICD-10-CM

## 2018-09-17 DIAGNOSIS — G4701 Insomnia due to medical condition: Secondary | ICD-10-CM | POA: Diagnosis not present

## 2018-09-17 DIAGNOSIS — G4730 Sleep apnea, unspecified: Secondary | ICD-10-CM

## 2018-09-17 DIAGNOSIS — E662 Morbid (severe) obesity with alveolar hypoventilation: Secondary | ICD-10-CM | POA: Diagnosis not present

## 2018-09-17 DIAGNOSIS — Z72821 Inadequate sleep hygiene: Secondary | ICD-10-CM | POA: Diagnosis not present

## 2018-09-17 DIAGNOSIS — Z87898 Personal history of other specified conditions: Secondary | ICD-10-CM

## 2018-09-17 DIAGNOSIS — G4710 Hypersomnia, unspecified: Secondary | ICD-10-CM | POA: Diagnosis not present

## 2018-09-17 NOTE — Progress Notes (Signed)
Virtual Visit via Video Note  I connected with Patricia Fuentes on 09/17/18 at  1:00 PM EDT by a video enabled telemedicine application and verified that I am speaking with the correct person using two identifiers.   I discussed the limitations of evaluation and management by telemedicine and the availability of in person appointments. The patient expressed understanding and agreed to proceed.    SLEEP MEDICINE CLINIC   Provider:  Melvyn Novas, M D  Primary Care Physician:  Dr. Purvis Sheffield, cardiologist referred this new patient.     HPI:  Patricia Fuentes is a 73 y.o. female patient , seen here in video upon referral revisit  from Dr. Purvis Sheffield.  Chief complaint according to patient : Husband reports she snores loudly he and hospital staff have witness apneas, worse after a knee surgery 2018 when sister in law and RNs reported to her she had severe apnea and hypoxemia. She started on oxygen alone .   She cannot sleep on her back  flat, needing 2-3 pillows, has nocturia ( very frequent 4-6 times each night, fragmenting her sleep ,and only sleeps barely 6 hours of the 10 hours in bed. Takes often naps.   Sleep medical history ; her bronchitis is seasonal, autumn and spring - may be allergy or virus driven. Denies asthma, denies DM but carried that dx. States she has HTN , well controlled but "took the BP just a couple of days ago and it was high"- can't say how high. She has been obese for a long time.    Family medical/ sleep history: mother died at 31 of a CVA.  not further explored   Social history: lives with her husband. 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 Adult son , grandson in [place]- he currently stays with her, able to work remotely from her home.    Sleep habits are as follows: bedtime is 11 pm after a dinner around 7 Pm. No TV in bedroom, she sleeps propped up, but has frequent bathroom breaks, not sure if she dreams. She wakes at 8 AM. Sleeps with 02 at 2  liters nasal canula.    Review of Systems: Out of a complete 14 system review, the patient complains of only the following symptoms, and all other reviewed systems are negative. How likely are you to doze in the following situations: 0 = not likely, 1 = slight chance, 2 = moderate chance, 3 = high chance  Sitting and Reading? Watching Television? Sitting inactive in a public place (theater or meeting)? Lying down in the afternoon when circumstances permit? Sitting and talking to someone? Sitting quietly after lunch without alcohol? In a car, while stopped for a few minutes in traffic? As a passenger in a car for an hour without a break?  Total = 3 per patient self assessment out of 24.      Social History   Socioeconomic History  . Marital status: Married    Spouse name: Not on file  . Number of children: Not on file  . Years of education: Not on file  . Highest education level: Not on file  Occupational History  . Occupation: retired    Associate Professor: UNEMPLOYED  Social Needs  . Financial resource strain: Not hard at all  . Food insecurity:    Worry: Never true    Inability: Never true  . Transportation needs:    Medical: No    Non-medical: No  Tobacco Use  . Smoking status:  Never Smoker  . Smokeless tobacco: Never Used  Substance and Sexual Activity  . Alcohol use: No  . Drug use: No  . Sexual activity: Yes    Birth control/protection: Surgical  Lifestyle  . Physical activity:    Days per week: 0 days    Minutes per session: 0 min  . Stress: Not at all  Relationships  . Social connections:    Talks on phone: More than three times a week    Gets together: More than three times a week    Attends religious service: More than 4 times per year    Active member of club or organization: No    Attends meetings of clubs or organizations: Never    Relationship status: Married  . Intimate partner violence:    Fear of current or ex partner: No    Emotionally abused: No     Physically abused: No    Forced sexual activity: No  Other Topics Concern  . Not on file  Social History Narrative  . Not on file    Family History  Problem Relation Age of Onset  . Hypertension Mother   . Stroke Mother [id number]       Deceased due CVa at 5059  . Diabetes Father   . Cancer Father [id number]       Unknown, somewhere in stomach  . Hypertension Brother   . Hypertension Brother   . Hypertension Brother   . Cancer Maternal Grandfather        ?type  . Hypertension Son     Past Medical History:  Diagnosis Date  . Bronchitis   . Chronic back pain   . Essential hypertension   . Hyperlipidemia   . IGT (impaired glucose tolerance) 2015  . Obesity, unspecified   . Osteoarthritis   . Seasonal allergies     Past Surgical History:  Procedure Laterality Date  . CARDIAC CATHETERIZATION N/A 10/25/2014   Procedure: Left Heart Cath and Coronary Angiography;  Surgeon: Kathleene Hazelhristopher D McAlhany, MD;  [facility];  Service: Cardiovascular;  Laterality: N/A;  . COLONOSCOPY N/A 02/09/2016   Procedure: COLONOSCOPY;  Surgeon: West BaliSandi L Fields, MD;  Location: [facility];  Service: Endoscopy;  Laterality: N/A;  12:30 PM  . KNEE ARTHROSCOPY Left   . TOTAL ABDOMINAL HYSTERECTOMY  1995 approx   Fibroids  . TOTAL KNEE ARTHROPLASTY Right 2011  . TOTAL KNEE ARTHROPLASTY Left 04/29/2017   Procedure: TOTAL KNEE ARTHROPLASTY;  Surgeon: Vickki HearingHarrison, Stanley E, MD;  Location: [facility];  Service: Orthopedics;  Laterality: Left;    Current Outpatient Medications  Medication Sig Dispense Refill  . albuterol (PROVENTIL HFA;VENTOLIN HFA) 108 (90 Base) MCG/ACT inhaler Inhale 1-2 puffs into the lungs every 6 (six) hours as needed for wheezing or shortness of breath. 1 Inhaler 2  . amLODipine (NORVASC) 2.5 MG tablet     . amLODipine (NORVASC) 5 MG tablet TAKE ONE TABLET BY MOUTH DAILY. 30 tablet 5  . aspirin EC 81 MG tablet Take 162 mg by mouth every morning.     . fluticasone (FLONASE) 50 MCG/ACT  nasal spray Place 1 spray into both nostrils daily. 16 g 1  . OXYGEN Inhale 2 L into the lungs at bedtime as needed.     No current facility-administered medications for this visit.     Allergies as of 09/17/2018 - Review Complete 09/16/2018  Allergen Reaction Noted  . Promethazine hcl Nausea And Vomiting and Other (See Comments) 10/07/2007  Vitals: There were no vitals taken for this visit. Last Weight:  Wt Readings from Last 1 Encounters:  07/13/18 240 lb (108.9 kg)   FXT:KWIOX is no height or weight on file to calculate BMI.     Last Height:   Ht Readings from Last 1 Encounters:  07/13/18 5\' 2"  (1.575 m)    Physical exam:  General: The patient is awake, alert and appears not in acute distress. The patient is  groomed. Head: Normocephalic, atraumatic. Neck is full ROM- Mallampati 4  neck circumference:15.5 "  . Nasal airflow patent- edentulous mouth.  No tachypnoea  This  patient is walking constantly during this VIDEO VISIT- which started with her in a dark room, too dark to recognize facial feature.  She also never puts the phone on a stand, making this visit very difficult.  We switched to telephone for he second part of the visit  . Skin:  Without evidence of edema, or rash Trunk: BMI is 45 The patient's posture is erect/.   Neurologic exam : The patient is awake and alert,  oriented to place and time.   Attention span & concentration ability appears abnormal.  Speech is fluent,  without dysarthria.  Mood and affect are normal, fluent conversation. She appreciated our conversation, is cooperative and seeks information .   Cranial nerves:no loss of taste or smell.  Pupils appear equal in size , EOM intact, no ptosis.  Facial motor strength is symmetric and tongue and uvula move midline. Shoulder shrug was symmetrical.  ROM in neck normal.   Coordination:  Rapid alternating movements in the fingers/hands was deferred. Were normal - no tremor, no ataxia.  Gait and  station: Patient walks without assistive device and seems able to follow all single and two-step commands.     Observations/Objective: The patient was very challenged by a video visit, and prepared with my nurse for over 40 minutes for this visit.  Step by step instructions- we changed to the interview part on phone , completing the visit on Friday 09-18-2018.  We discussed sleep hygiene and environmental changes to help her sleep longer, with more restorative quality.  She has carried a diagnosis of obesity hypoventilation and is on oxygen- we will need her keep off oxygen for the HST.   Once we have evaluated her apnea level we may find that she is helped for apnea by AHI and hypoxemia (SPO2 control ) by PAP therapy.     Assessment and Plan: she will be set up for HST - since she lives in [place] , she may prefer a mail in device.    Follow Up Instructions: NP visit after evaluation for OSA by HST. The patient already carries a diagnosis of obesity hypoventilation and may not be a candidate for auto CPAP- if intervention becomes necessary- in that case she would return for an attended CPAP titration at [facility].   *The same is true for oxygen supplement- the HST needs to be done without nasal canula O2- the desaturation degree establishes the AHI. she is already on supplemental oxygen by cardiology/ pulmonology. IF NOT ABLE TO SLEEP WITHOUT O2, will need her to be one of the first in lab patients once we open again.      I discussed the assessment and treatment plan with the patient. The patient was provided an opportunity to ask questions and all were answered. The patient agreed with the plan and demonstrated an understanding of the instructions.   The patient was  advised to call back or seek an in-person evaluation if the symptoms worsen or if the condition fails to improve as anticipated.  I provided 45 minutes of non-face-to-face time during this encounter. Sleep hygiene  instructions are attached to ASV, long discussion.  Very pleasant patient.     Melvyn Novas, MD   Melvyn Novas, MD 09/17/2018, 1:21 PM  Certified in Neurology by ABPN Certified in Sleep Medicine by [facility] [address] [place], [place] [address]

## 2018-09-17 NOTE — Patient Instructions (Signed)

## 2018-09-17 NOTE — Telephone Encounter (Signed)
Pt called in and stated she will be by her phone to finish call tomorrow

## 2018-09-17 NOTE — Telephone Encounter (Signed)
Patient worked so hard to complete the video visit and it was disconnected during the call/video visit. Dr Vickey Huger has advised that she will complete the rest of the phone visit over the phone tomorrow. I called and left a message and informed her that Dr Vickey Huger can finish this visit tomorrow 4/24 at 10 am. Advised the patient to call back and let me know if she will be available. ** if patient calls back, no need to reschedule the patient please just ask the patient if she can be by her phone around 9:50-10 am tomorrow and Dr Vickey Huger will call her.

## 2018-09-18 ENCOUNTER — Encounter: Payer: Self-pay | Admitting: Neurology

## 2018-09-22 ENCOUNTER — Telehealth: Payer: Self-pay | Admitting: Cardiovascular Disease

## 2018-09-22 NOTE — Telephone Encounter (Signed)
Virtual Visit Pre-Appointment Phone Call  "(Name), I am calling you today to discuss your upcoming appointment. We are currently trying to limit exposure to the virus that causes COVID-19 by seeing patients at home rather than in the office."  1. "What is the BEST phone number to call the day of the visit?" - include this in appointment notes  2. Do you have or have access to (through a family member/friend) a smartphone with video capability that we can use for your visit?" a. If yes - list this number in appt notes as cell (if different from BEST phone #) and list the appointment type as a VIDEO visit in appointment notes b. If no - list the appointment type as a PHONE visit in appointment notes  3. Confirm consent - "In the setting of the current Covid19 crisis, you are scheduled for a (phone or video) visit with your provider on (date) at (time).  Just as we do with many in-office visits, in order for you to participate in this visit, we must obtain consent.  If you'd like, I can send this to your mychart (if signed up) or email for you to review.  Otherwise, I can obtain your verbal consent now.  All virtual visits are billed to your insurance company just like a normal visit would be.  By agreeing to a virtual visit, we'd like you to understand that the technology does not allow for your provider to perform an examination, and thus may limit your provider's ability to fully assess your condition. If your provider identifies any concerns that need to be evaluated in person, we will make arrangements to do so.  Finally, though the technology is pretty good, we cannot assure that it will always work on either your or our end, and in the setting of a video visit, we may have to convert it to a phone-only visit.  In either situation, we cannot ensure that we have a secure connection.  Are you willing to proceed?" STAFF: Did the patient verbally acknowledge consent to telehealth visit? Document  YES/NO here: Yes  4. Advise patient to be prepared - "Two hours prior to your appointment, go ahead and check your blood pressure, pulse, oxygen saturation, and your weight (if you have the equipment to check those) and write them all down. When your visit starts, your provider will ask you for this information. If you have an Apple Watch or Kardia device, please plan to have heart rate information ready on the day of your appointment. Please have a pen and paper handy nearby the day of the visit as well."  5. Give patient instructions for MyChart download to smartphone OR [email or web address] as below if video visit (depending on what platform provider is using)  6. Inform patient they will receive a phone call 15 minutes prior to their appointment time (may be from unknown caller ID) so they should be prepared to answer    TELEPHONE CALL NOTE  Patricia Fuentes has been deemed a candidate for a follow-up tele-health visit to limit community exposure during the Covid-19 pandemic. I spoke with the patient via phone to ensure availability of phone/video source, confirm preferred email & phone number, and discuss instructions and expectations.  I reminded Patricia Fuentes to be prepared with any vital sign and/or heart rhythm information that could potentially be obtained via home monitoring, at the time of her visit. I reminded Patricia Fuentes to expect a phone call prior to  her visit.  Terry L Goins 09/22/2018 9:25 AM

## 2018-09-24 ENCOUNTER — Encounter: Payer: Self-pay | Admitting: Cardiovascular Disease

## 2018-09-24 ENCOUNTER — Telehealth (INDEPENDENT_AMBULATORY_CARE_PROVIDER_SITE_OTHER): Payer: Medicare HMO | Admitting: Cardiovascular Disease

## 2018-09-24 VITALS — BP 140/78 | HR 82 | Wt 228.8 lb

## 2018-09-24 DIAGNOSIS — R42 Dizziness and giddiness: Secondary | ICD-10-CM | POA: Diagnosis not present

## 2018-09-24 DIAGNOSIS — E662 Morbid (severe) obesity with alveolar hypoventilation: Secondary | ICD-10-CM

## 2018-09-24 DIAGNOSIS — G4733 Obstructive sleep apnea (adult) (pediatric): Secondary | ICD-10-CM

## 2018-09-24 DIAGNOSIS — R0902 Hypoxemia: Secondary | ICD-10-CM | POA: Diagnosis not present

## 2018-09-24 DIAGNOSIS — R011 Cardiac murmur, unspecified: Secondary | ICD-10-CM | POA: Diagnosis not present

## 2018-09-24 DIAGNOSIS — I10 Essential (primary) hypertension: Secondary | ICD-10-CM | POA: Diagnosis not present

## 2018-09-24 NOTE — Progress Notes (Signed)
Virtual Visit via Telephone Note   This visit type was conducted due to national recommendations for restrictions regarding the COVID-19 Pandemic (e.g. social distancing) in an effort to limit this patient's exposure and mitigate transmission in our community.  Due to her co-morbid illnesses, this patient is at least at moderate risk for complications without adequate follow up.  This format is felt to be most appropriate for this patient at this time.  The patient did not have access to video technology/had technical difficulties with video requiring transitioning to audio format only (telephone).  All issues noted in this document were discussed and addressed.  No physical exam could be performed with this format.  Please refer to the patient's chart for her  consent to telehealth for [facility].   Evaluation Performed:  Follow-up visit  Date:  09/24/2018   ID:  Patricia Fuentes, Patricia Fuentes 25-Jan-1946, MRN [id number]  Patient Location: Home Provider Location: Home  PCP:  Kerri Perches, MD  Cardiologist:  Prentice Docker, MD  Electrophysiologist:  None   Chief Complaint:  Lightheadedness  History of Present Illness:    Patricia Fuentes is a 73 y.o. female with morbid obesity, HTN, and HLD.  She has been doing well overall. She will soon be undergoing a home sleep test. She denies chest pain and shortness of breath.  Oxygen levels have been normal. If she stands up too quickly she gets dizzy.   The patient does not have symptoms concerning for COVID-19 infection (fever, chills, cough, or new shortness of breath).    Past Medical History:  Diagnosis Date  . Bronchitis   . Chronic back pain   . Essential hypertension   . Hyperlipidemia   . IGT (impaired glucose tolerance) 2015  . Obesity, unspecified   . Osteoarthritis   . Seasonal allergies    Past Surgical History:  Procedure Laterality Date  . CARDIAC CATHETERIZATION N/A 10/25/2014   Procedure: Left Heart Cath and  Coronary Angiography;  Surgeon: Kathleene Hazel, MD;  Location: [facility] INVASIVE CV LAB;  Service: Cardiovascular;  Laterality: N/A;  . COLONOSCOPY N/A 02/09/2016   Procedure: COLONOSCOPY;  Surgeon: West Bali, MD;  Location: [facility] ENDO SUITE;  Service: Endoscopy;  Laterality: N/A;  12:30 PM  . KNEE ARTHROSCOPY Left   . TOTAL ABDOMINAL HYSTERECTOMY  1995 approx   Fibroids  . TOTAL KNEE ARTHROPLASTY Right 2011  . TOTAL KNEE ARTHROPLASTY Left 04/29/2017   Procedure: TOTAL KNEE ARTHROPLASTY;  Surgeon: Vickki Hearing, MD;  Location: [facility] ORS;  Service: Orthopedics;  Laterality: Left;     Current Meds  Medication Sig  . albuterol (PROVENTIL HFA;VENTOLIN HFA) 108 (90 Base) MCG/ACT inhaler Inhale 1-2 puffs into the lungs every 6 (six) hours as needed for wheezing or shortness of breath.  Marland Kitchen amLODipine (NORVASC) 2.5 MG tablet Take 2.5 mg by mouth every evening.   Marland Kitchen amLODipine (NORVASC) 5 MG tablet Take 5 mg by mouth every morning.  Marland Kitchen aspirin EC 81 MG tablet Take 162 mg by mouth every morning.   . fluticasone (FLONASE) 50 MCG/ACT nasal spray Place 1 spray into both nostrils as needed for allergies or rhinitis.  . OXYGEN Inhale 2 L into the lungs at bedtime as needed.  . [DISCONTINUED] fluticasone (FLONASE) 50 MCG/ACT nasal spray Place 1 spray into both nostrils daily. (Patient taking differently: Place 1 spray into both nostrils daily. As needed)     Allergies:   Promethazine hcl   Social History   Tobacco Use  .  Smoking status: Never Smoker  . Smokeless tobacco: Never Used  Substance Use Topics  . Alcohol use: No  . Drug use: No     Family Hx: The patient's family history includes Cancer in her maternal grandfather; Cancer (age of onset: 32) in her father; Diabetes in her father; Hypertension in her brother, brother, brother, mother, and son; Stroke (age of onset: 71) in her mother.  ROS:   Please see the history of present illness.     All other systems reviewed and are negative.    Prior CV studies:   The following studies were reviewed today:  Echocardiogram (07/09/18):   1. The left ventricle has normal systolic function with an ejection fraction of 60-65%. The cavity size was normal. There is moderately increased left ventricular wall thickness. Left ventricular diastolic Doppler parameters are consistent with impaired  relaxation.  2. The right ventricle has normal systolic function. The cavity was mildly enlarged. There is no increase in right ventricular wall thickness.  3. Left atrial size was mildly dilated.  4. Right atrial size was mildly dilated.  5. The mitral valve is normal in structure. No evidence of mitral valve stenosis.  6. The tricuspid valve is normal in structure.  7. The aortic valve is tricuspid.  8. The aortic root is normal in size and structure.  9. Pulmonary hypertension is indeterminant, inadequate TR jet. 10. The inferior vena cava was dilated in size with >50% respiratory variability. 11. Probable small PFO with mild left to right shunt. 12. Right atrial pressure is estimated at 8 mmHg.  Labs/Other Tests and Data Reviewed:    EKG:  No ECG reviewed.  Recent Labs: 04/30/2018: TSH 1.160 06/08/2018: ALT 10; B Natriuretic Peptide 96.0 06/09/2018: Hemoglobin 12.0; Platelets 170 06/23/2018: BUN 10; Creat 0.67; Potassium 4.0; Sodium 143   Recent Lipid Panel Lab Results  Component Value Date/Time   CHOL 164 04/30/2018 01:00 PM   TRIG 47 04/30/2018 01:00 PM   HDL 59 04/30/2018 01:00 PM   CHOLHDL 2.8 04/30/2018 01:00 PM   LDLCALC 96 04/30/2018 01:00 PM   LDLCALC 92 02/11/2017 02:23 PM    Wt Readings from Last 3 Encounters:  09/24/18 228 lb 12.8 oz (103.8 kg)  09/18/18 230 lb (104.3 kg)  07/13/18 240 lb (108.9 kg)     Objective:    Vital Signs:  BP 140/78   Pulse 82   Wt 228 lb 12.8 oz (103.8 kg)   BMI 41.31 kg/m    VITAL SIGNS:  reviewed  ASSESSMENT & PLAN:    1.  Dizziness and hypoxia: Coronary angiography was  normal in May 2016 as detailed above.  She was hospitalized for hypoxia deemed secondary to obesity hypoventilation syndrome, obstructive sleep apnea and acute reactive airway disease from bronchitis. She is morbidly obese.  O2 saturations reportedly dropped to the 70% range with any significant exertion and when she wakes up.  I suspect she indeed has obesity hypoventilation syndrome as well as obstructive sleep apnea. Having mild RA and RV enlargement would be consistent with this. She has an upcoming home sleep test.  I suspect bilateral leg edema is due to morbid obesity and abdominal venous compression.  2.  Hypertension: Blood pressure is mildly elevated.  She needs weight loss and treatment for likely obstructive sleep apnea.  3.  Cardiac murmur: No significant valvular pathology. Likely due to turbulent flow from moderate LVH.  4.  Hypoxia/apneic episodes/obstructive sleep apnea: Given her morbid obesity and hypoxia with witnessed apneic  episodes, she very likely has obstructive sleep apnea. Having mild RA and RV enlargement would be consistent with this. She has an upcoming home sleep test.  5.  Morbid obesity: She need significant weight loss.  COVID-19 Education: The signs and symptoms of COVID-19 were discussed with the patient and how to seek care for testing (follow up with PCP or arrange E-visit).  The importance of social distancing was discussed today.  Time:   Today, I have spent 15 minutes with the patient with telehealth technology discussing the above problems.     Medication Adjustments/Labs and Tests Ordered: Current medicines are reviewed at length with the patient today.  Concerns regarding medicines are outlined above.   Tests Ordered: No orders of the defined types were placed in this encounter.   Medication Changes: No orders of the defined types were placed in this encounter.   Disposition:  Follow up prn  Signed, Prentice DockerSuresh Koneswaran, MD  09/24/2018  4:38 PM    [facility]

## 2018-09-24 NOTE — Patient Instructions (Signed)
Medication Instructions:  Continue all current medications.  Labwork: none  Testing/Procedures: none  Follow-Up: As needed.    Any Other Special Instructions Will Be Listed Below (If Applicable).  If you need a refill on your cardiac medications before your next appointment, please call your pharmacy.  

## 2018-10-08 ENCOUNTER — Telehealth: Payer: Self-pay | Admitting: Neurology

## 2018-10-08 DIAGNOSIS — G4733 Obstructive sleep apnea (adult) (pediatric): Secondary | ICD-10-CM

## 2018-10-08 NOTE — Addendum Note (Signed)
Addended by: Judi Cong on: 10/08/2018 04:44 PM   Modules accepted: Orders

## 2018-10-08 NOTE — Telephone Encounter (Signed)
Pt stated she is unable to come off her O2 for the home sleep study. Pt is coming to do a in lab study. Please place and order for in lab.

## 2018-10-08 NOTE — Telephone Encounter (Signed)
Order placed

## 2018-10-10 DIAGNOSIS — Z96659 Presence of unspecified artificial knee joint: Secondary | ICD-10-CM | POA: Diagnosis not present

## 2018-10-10 DIAGNOSIS — E662 Morbid (severe) obesity with alveolar hypoventilation: Secondary | ICD-10-CM | POA: Diagnosis not present

## 2018-10-10 DIAGNOSIS — Z4789 Encounter for other orthopedic aftercare: Secondary | ICD-10-CM | POA: Diagnosis not present

## 2018-10-14 ENCOUNTER — Other Ambulatory Visit: Payer: Self-pay

## 2018-10-14 ENCOUNTER — Encounter (HOSPITAL_COMMUNITY): Payer: Self-pay

## 2018-10-14 ENCOUNTER — Ambulatory Visit (HOSPITAL_COMMUNITY)
Admission: RE | Admit: 2018-10-14 | Discharge: 2018-10-14 | Disposition: A | Discharge status: Home / Self Care | Payer: Medicare HMO | Source: Ambulatory Visit | Attending: Family Medicine | Admitting: Family Medicine

## 2018-10-14 DIAGNOSIS — Z1231 Encounter for screening mammogram for malignant neoplasm of breast: Secondary | ICD-10-CM | POA: Insufficient documentation

## 2018-10-22 ENCOUNTER — Other Ambulatory Visit: Payer: Self-pay

## 2018-10-22 ENCOUNTER — Ambulatory Visit (INDEPENDENT_AMBULATORY_CARE_PROVIDER_SITE_OTHER): Payer: Medicare HMO | Admitting: Family Medicine

## 2018-10-22 ENCOUNTER — Encounter: Payer: Self-pay | Admitting: Family Medicine

## 2018-10-22 VITALS — BP 132/70 | Ht 62.4 in | Wt 228.0 lb

## 2018-10-22 DIAGNOSIS — Z7189 Other specified counseling: Secondary | ICD-10-CM

## 2018-10-22 DIAGNOSIS — R7303 Prediabetes: Secondary | ICD-10-CM | POA: Diagnosis not present

## 2018-10-22 DIAGNOSIS — I10 Essential (primary) hypertension: Secondary | ICD-10-CM

## 2018-10-22 DIAGNOSIS — E662 Morbid (severe) obesity with alveolar hypoventilation: Secondary | ICD-10-CM | POA: Diagnosis not present

## 2018-10-22 DIAGNOSIS — E785 Hyperlipidemia, unspecified: Secondary | ICD-10-CM

## 2018-10-22 DIAGNOSIS — G4733 Obstructive sleep apnea (adult) (pediatric): Secondary | ICD-10-CM | POA: Diagnosis not present

## 2018-10-22 DIAGNOSIS — E559 Vitamin D deficiency, unspecified: Secondary | ICD-10-CM | POA: Diagnosis not present

## 2018-10-22 DIAGNOSIS — R7301 Impaired fasting glucose: Secondary | ICD-10-CM | POA: Diagnosis not present

## 2018-10-22 NOTE — Assessment & Plan Note (Signed)
Covid-19 Education  The signs and symptoms of of COVID -19 were discussed with the patient and how to seek care for testing. ( follow up with PCP or arrange  E-visit) The importance of social  distancing is discussed today.  

## 2018-10-22 NOTE — Assessment & Plan Note (Signed)
Patient re-educated about  the importance of commitment to a  minimum of 150 minutes of exercise per week as able.  The importance of healthy food choices with portion control discussed, as well as eating regularly and within a 12 hour window most days. The need to choose "clean , green" food 50 to 75% of the time is discussed, as well as to make water the primary drink and set a goal of 64 ounces water daily.  Encouraged to start a food diary,  and to consider  joining a support group. Sample diet sheets offered. Goals set by the patient for the next several months.   Weight /BMI 10/22/2018 09/24/2018 09/17/2018  WEIGHT 228 lb 228 lb 12.8 oz 230 lb  HEIGHT 5' 2.4" - 5' 2.4"  BMI 41.17 kg/m2 41.31 kg/m2 41.53 kg/m2

## 2018-10-22 NOTE — Progress Notes (Signed)
Virtual Visit via Telephone Note  I connected with Patricia Fuentes on 10/22/18 at 10:20 AM EDT by telephone and verified that I am speaking with the correct person using two identifiers.  Location: Patient: home Provider: office  I discussed the limitations, risks, security and privacy concerns of performing an evaluation and management service by telephone and the availability of in person appointments. I also discussed with the patient that there may be a patient responsible charge related to this service. The patient expressed understanding and agreed to proceed. This visit type is conducted due to national recommendations for restrictions regarding the COVID -19 Pandemic. Due to the patient's age and / or co morbidities, this format is felt to be most appropriate at this time without adequate follow up. The patient has no access to video technology/ had technical difficulties with video, requiring transitioning to audio format  only ( telephone ). All issues noted this document were discussed and addressed,no physical exam can be performed in this format.   History of Present Illness: F/u chronic problems Labs past due amd will get this soon Health screens are up o date Has lost weight with change in food choice, and has started walking on average 2 days / week Denies recent fever or chills. Denies sinus pressure, nasal congestion, ear pain or sore throat. Denies chest congestion, productive cough or wheezing. Denies chest pains, palpitations and leg swelling Denies abdominal pain, nausea, vomiting,diarrhea or constipation.   Denies dysuria, frequency, hesitancy or incontinence. Chronic  joint pain, swelling and limitation in mobility. Denies headaches, seizures, numbness, or tingling. Denies depression, anxiety or insomnia. Denies skin break down or rash.       Observations/Objective: BP 132/70   Ht 5' 2.4" (1.585 m)   Wt 228 lb (103.4 kg)   BMI 41.17 kg/m   Good  communication with no confusion and intact memory. Alert and oriented x 3 No signs of respiratory distress during sppech    Assessment and Plan:  Essential hypertension Controlled, no change in medication DASH diet and commitment to daily physical activity for a minimum of 30 minutes discussed and encouraged, as a part of hypertension management. The importance of attaining a healthy weight is also discussed.  BP/Weight 10/22/2018 09/24/2018 09/17/2018 09/03/2018 07/13/2018 06/26/2018 06/23/2018  Systolic BP 132 140 - 133 142 [id number] 110  Diastolic BP 70 78 - 72 73 76 62  Wt. (Lbs) 228 228.8 230 - 240 243 244  BMI 41.17 41.31 41.53 - 43.9 44.45 44.63       Obesity with alveolar hypoventilation and body mass index (BMI) of 40 or greater (HCC) Patient re-educated about  the importance of commitment to a  minimum of 150 minutes of exercise per week as able.  The importance of healthy food choices with portion control discussed, as well as eating regularly and within a 12 hour window most days. The need to choose "clean , green" food 50 to 75% of the time is discussed, as well as to make water the primary drink and set a goal of 64 ounces water daily.  Encouraged to start a food diary,  and to consider  joining a support group. Sample diet sheets offered. Goals set by the patient for the next several months.   Weight /BMI 10/22/2018 09/24/2018 09/17/2018  WEIGHT 228 lb 228 lb 12.8 oz 230 lb  HEIGHT 5' 2.4" - 5' 2.4"  BMI 41.17 kg/m2 41.31 kg/m2 41.53 kg/m2      Prediabetes Patient educated about the  importance of limiting  Carbohydrate intake , the need to commit to daily physical activity for a minimum of 30 minutes , and to commit weight loss. The fact that changes in all these areas will reduce or eliminate all together the development of diabetes is stressed.  Updated lab needed at/ before next visit.   Diabetic Labs Latest Ref Rng & Units 06/23/2018 06/09/2018 06/08/2018 04/30/2018  07/23/2017  HbA1c 4.8 - 5.6 % - - - 5.7(H) -  Chol 0 - 200 mg/dL - - - 931 -  HDL >12 mg/dL - - - 59 -  Calc LDL 0 - 99 mg/dL - - - 96 -  Triglycerides <150 mg/dL - - - 47 -  Creatinine 0.60 - 0.93 mg/dL 1.62 4.46 9.50 7.22 5.75   BP/Weight 10/22/2018 09/24/2018 09/17/2018 09/03/2018 07/13/2018 06/26/2018 06/23/2018  Systolic BP 132 140 - 133 142 051 110  Diastolic BP 70 78 - 72 73 76 62  Wt. (Lbs) 228 228.8 230 - 240 243 244  BMI 41.17 41.31 41.53 - 43.9 44.45 44.63   No flowsheet data found.    Obstructive sleep apnea Use of CPAP regularly is stressed   Follow Up Instructions:    I discussed the assessment and treatment plan with the patient. The patient was provided an opportunity to ask questions and all were answered. The patient agreed with the plan and demonstrated an understanding of the instructions.   The patient was advised to call back or seek an in-person evaluation if the symptoms worsen or if the condition fails to improve as anticipated.  I provided 22  minutes of non-face-to-face time during this encounter.   Syliva Overman, MD

## 2018-10-22 NOTE — Assessment & Plan Note (Signed)
Controlled, no change in medication DASH diet and commitment to daily physical activity for a minimum of 30 minutes discussed and encouraged, as a part of hypertension management. The importance of attaining a healthy weight is also discussed.  BP/Weight 10/22/2018 09/24/2018 09/17/2018 09/03/2018 07/13/2018 06/26/2018 06/23/2018  Systolic BP 132 140 - 133 142 122 110  Diastolic BP 70 78 - 72 73 76 62  Wt. (Lbs) 228 228.8 230 - 240 243 244  BMI 41.17 41.31 41.53 - 43.9 44.45 44.63

## 2018-10-22 NOTE — Assessment & Plan Note (Signed)
Use of CPAP regularly is stressed

## 2018-10-22 NOTE — Assessment & Plan Note (Signed)
Patient educated about the importance of limiting  Carbohydrate intake , the need to commit to daily physical activity for a minimum of 30 minutes , and to commit weight loss. The fact that changes in all these areas will reduce or eliminate all together the development of diabetes is stressed.  Updated lab needed at/ before next visit.   Diabetic Labs Latest Ref Rng & Units 06/23/2018 06/09/2018 06/08/2018 04/30/2018 07/23/2017  HbA1c 4.8 - 5.6 % - - - 5.7(H) -  Chol 0 - 200 mg/dL - - - 852 -  HDL >77 mg/dL - - - 59 -  Calc LDL 0 - 99 mg/dL - - - 96 -  Triglycerides <150 mg/dL - - - 47 -  Creatinine 0.60 - 0.93 mg/dL 8.24 2.35 3.61 4.43 1.54   BP/Weight 10/22/2018 09/24/2018 09/17/2018 09/03/2018 07/13/2018 06/26/2018 06/23/2018  Systolic BP 132 140 - 133 142 008 110  Diastolic BP 70 78 - 72 73 76 62  Wt. (Lbs) 228 228.8 230 - 240 243 244  BMI 41.17 41.31 41.53 - 43.9 44.45 44.63   No flowsheet data found.

## 2018-10-22 NOTE — Patient Instructions (Signed)
F/U in 4.5  months in office with MD, call if you need me sooner  Non fasting chem 7 and eGFR and hBA1C and vit D level at hospital in the next week please  CONGRATS on weight loss with change in food choice, keep it up!  It is important that you exercise regularly at least 30 minutes 5 times a week. If you develop chest pain, have severe difficulty breathing, or feel very tired, stop exercising immediately and seek medical attention  Social distancing.Maintain a 6 ft or more distance and avoid crowds Frequent hand washing with soap and water Keeping your hands off of your face.Wear a face mask in public These 3 practices will help to keep both you and your community healthy during this time. Please practice them faithfully!  It is important that you exercise regularly at least 30 minutes 5 times a week. If you develop chest pain, have severe difficulty breathing, or feel very tired, stop exercising immediately and seek medical attention    Thanks for choosing [facility], we consider it a privelige to serve you.

## 2018-10-23 ENCOUNTER — Ambulatory Visit (INDEPENDENT_AMBULATORY_CARE_PROVIDER_SITE_OTHER): Payer: Medicare HMO | Admitting: Neurology

## 2018-10-23 DIAGNOSIS — G4733 Obstructive sleep apnea (adult) (pediatric): Secondary | ICD-10-CM

## 2018-10-23 DIAGNOSIS — Z9981 Dependence on supplemental oxygen: Secondary | ICD-10-CM

## 2018-10-23 DIAGNOSIS — J9611 Chronic respiratory failure with hypoxia: Secondary | ICD-10-CM | POA: Diagnosis not present

## 2018-10-23 DIAGNOSIS — Z6841 Body Mass Index (BMI) 40.0 and over, adult: Secondary | ICD-10-CM

## 2018-10-23 DIAGNOSIS — I10 Essential (primary) hypertension: Secondary | ICD-10-CM | POA: Diagnosis not present

## 2018-10-23 DIAGNOSIS — R0601 Orthopnea: Secondary | ICD-10-CM

## 2018-10-23 DIAGNOSIS — G4734 Idiopathic sleep related nonobstructive alveolar hypoventilation: Secondary | ICD-10-CM

## 2018-10-26 ENCOUNTER — Other Ambulatory Visit: Payer: Self-pay

## 2018-10-29 ENCOUNTER — Other Ambulatory Visit: Payer: Self-pay | Admitting: Family Medicine

## 2018-11-02 NOTE — Procedures (Signed)
PATIENT'S NAME:  Patricia, Fuentes:      12/28/1945      MR#:    [id number]     DATE OF RECORDING: 10/23/2018  Farrel Demark REFERRING M.D.:  Tula Nakayama MD Study Performed:   Baseline Polysomnogram for oxygen dependent patient. HISTORY: Chief complaint according to this 73 year old female patient : Her Husband reports she snores loudly, he and hospital staff have witness apneas after a knee surgery 2018 -RNs reported to her she had severe apnea and hypoxemia. She started on oxygen and not CPAP following that observation.   She cannot sleep on her back , can't tolerate lying flat, needing 2-3 pillows, has nocturia ( very frequent 4-6 times each night, fragmenting her sleep ,and only sleeps barely 6 hours of the 10 hours in bed. Takes often naps.  Her bronchitis is seasonal, in autumn and spring - may be allergy or virus driven. Denies asthma, denies DM but carried that dx. States she has HTN, well controlled but "took the BP just a couple of days ago and it was high"- can't say how high. She has been obese for a long time.  Sleep habits are as follows: bedtime is 11 pm after a dinner around 7 Pm. No TV in bedroom, she sleeps propped up, but has frequent bathroom breaks, she is unsure if she dreams. She wakes at 8 AM. Sleeps with 02 at 2 liters per nasal cannula.   The patient endorsed the Epworth Sleepiness Scale at 3 points.   The patient's weight 230 pounds with a height of 62 (inches), resulting in a BMI of 42.2 kg/m2. The patient's neck circumference measured 16 inches.  CURRENT MEDICATIONS: -not listed by Technician.    PROCEDURE:  This is a multichannel digital polysomnogram utilizing the Somnostar 11.2 system.  Electrodes and sensors were applied and monitored per AASM Specifications.   EEG, EOG, Chin and Limb EMG, were sampled at 200 Hz.  ECG, Snore and Nasal Pressure, Thermal Airflow, Respiratory Effort, CPAP Flow and Pressure, Oximetry was sampled at 50 Hz. Digital video and audio  were recorded.      BASELINE STUDY: Lights Out was at 22:12 and Lights On at 04:50.  Total recording time (TRT) was 398.5 minutes, with a total sleep time (TST) of 103 minutes.   The patient's sleep latency was 21.5 minutes.  REM latency was 230 minutes.  The sleep efficiency was 35.8 %.     SLEEP ARCHITECTURE: WASO (Wake after sleep onset) was 273.5 minutes.  There were 5.5 minutes in Stage N1, 91.5 minutes Stage N2, 0.5 minutes Stage N3 and 5.5 minutes in Stage REM.  The percentage of Stage N1 was 4.2%, Stage N2 was 89.6%, Stage N3 was .5% and Stage R (REM sleep) was 5.7%.   RESPIRATORY ANALYSIS:  There were a total of 34 respiratory events:  5 obstructive apneas, 0 central apneas and 2 mixed apneas with a total of 7 apneas and an apnea index (AI) of 4.4 /hour. There were 27 hypopneas with a hypopnea index of 16.9 /hour. The patient also had 0 respiratory event related arousals (RERAs).    The total APNEA/HYPOPNEA INDEX (AHI) was 21.25 /hour. 5 events occurred in REM sleep and 54 events in NREM. The REM AHI was 54.5 /hour, versus a non-REM AHI of 19.2. The patient spent 9 minutes of total sleep time in the supine position and 94 minutes in non-supine. The supine AHI was 0.0 versus a non-supine AHI of 23.4.  OXYGEN  SATURATION & C02:  The Wake baseline 02 saturation was 92%, with the lowest being 59%. Time spent below 89% saturation equaled 87 minutes. Oxygen was given after observation of nadir at 58%   The arousals were noted as: 19 were spontaneous, 0 were associated with PLMs, and only 3 were associated with respiratory events. The patient had a total of 0 Periodic Limb Movements.    Audio and video analysis did not show any abnormal or unusual movements, behaviors, phonations or vocalizations.  There was a low sleep efficiency noted.   The patient took3 bathroom breaks. Moderate to loud Snoring was noted. EKG was in keeping with sinus rhythm (NSR). Post-study, the patient indicated that  sleep was difficult to sustain, of poor sleep quality and fragmented.    IMPRESSION:  1. Obstructive Sleep Apnea (OSA) at AHI 21.5/h and strong accentuation during REM sleep to over 50 AHI. 2. Snoring 3. Sleep hypoxia- severe. During REM sleep, there was a Nadir SpO2 of 59%.  4. Regular EKG  RECOMMENDATIONS:  1. Advise full-night, attended, CPAP titration study to optimize therapy.  Please use Oxygen as needed.    I certify that I have reviewed the entire raw data recording prior to the issuance of this report in accordance with the Standards of Accreditation of the American Academy of Sleep Medicine (AASM)   Melvyn Novasarmen Shanterria Franta, MD    11-02-2018 Diplomat, American Board of Neurology (ABPN),  Member of the AASM, AAN.   Diplomat, Biomedical engineerAmerican Board of Sleep Medicine Wellsite geologistMedical Director, [facility]

## 2018-11-02 NOTE — Addendum Note (Signed)
Addended by: Larey Seat on: 11/02/2018 09:07 AM   Modules accepted: Orders

## 2018-11-03 ENCOUNTER — Telehealth: Payer: Self-pay | Admitting: Neurology

## 2018-11-03 NOTE — Telephone Encounter (Signed)
I called pt. I advised pt that Dr. Brett Fairy reviewed their sleep study results and found that has sleep apnea and recommends that pt be treated with a cpap. Dr. Brett Fairy recommends that pt return for a repeat sleep study in order to properly titrate the cpap and ensure a good mask fit. Pt is agreeable to returning for a titration study. I advised pt that our sleep lab will file with pt's insurance and call pt to schedule the sleep study when we hear back from the pt's insurance regarding coverage of this sleep study. Pt verbalized understanding of results. Pt had no questions at this time but was encouraged to call back if questions arise.  Advised the patient that the order is in for titration study but that I am unsure when our sleep lab will start up with completing titration studies. Advised that the sleep lab will inform me if we need to attempt an alternative route for treatment of the patient's apnea. Pt verbalized understanding. She requested a copy be mailed to her and I confirmed the address.

## 2018-11-03 NOTE — Telephone Encounter (Signed)
-----   Message from Larey Seat, MD sent at 11/02/2018  9:07 AM EDT ----- There was a  low sleep efficiency noted.   The patient took3 bathroom breaks. Moderate to loud Snoring was noted. EKG was in keeping with sinus rhythm (NSR). Post-study, the patient indicated that sleep was difficult to  sustain, of poor sleep quality and fragmented.    IMPRESSION:  1. Obstructive Sleep Apnea (OSA) at AHI 21.5/h and strong  accentuation during REM sleep to over 50 AHI. 2. Snoring 3. Sleep hypoxia- severe. During REM sleep, there was a Nadir  SpO2 of 59%.  4. Regular EKG  RECOMMENDATIONS:  1. Advise full-night, attended, CPAP titration study to optimize  therapy. Please use Oxygen as needed.    Larey Seat, MD  11-02-2018

## 2018-11-06 ENCOUNTER — Other Ambulatory Visit: Payer: Self-pay

## 2018-11-06 ENCOUNTER — Other Ambulatory Visit (HOSPITAL_COMMUNITY)
Admission: RE | Admit: 2018-11-06 | Discharge: 2018-11-06 | Disposition: A | Discharge status: Home / Self Care | Payer: Medicare HMO | Source: Ambulatory Visit | Attending: Pulmonary Disease | Admitting: Pulmonary Disease

## 2018-11-06 DIAGNOSIS — Z01812 Encounter for preprocedural laboratory examination: Secondary | ICD-10-CM | POA: Diagnosis not present

## 2018-11-06 DIAGNOSIS — Z1159 Encounter for screening for other viral diseases: Secondary | ICD-10-CM | POA: Diagnosis not present

## 2018-11-07 LAB — NOVEL CORONAVIRUS, NAA (HOSP ORDER, SEND-OUT TO REF LAB; TAT 18-24 HRS): SARS-CoV-2, NAA: NOT DETECTED

## 2018-11-09 ENCOUNTER — Other Ambulatory Visit (HOSPITAL_COMMUNITY)
Admission: RE | Admit: 2018-11-09 | Discharge: 2018-11-09 | Disposition: A | Discharge status: Home / Self Care | Payer: Medicare HMO | Source: Ambulatory Visit | Attending: Family Medicine | Admitting: Family Medicine

## 2018-11-09 DIAGNOSIS — E559 Vitamin D deficiency, unspecified: Secondary | ICD-10-CM | POA: Insufficient documentation

## 2018-11-09 DIAGNOSIS — R7301 Impaired fasting glucose: Secondary | ICD-10-CM | POA: Insufficient documentation

## 2018-11-09 DIAGNOSIS — I10 Essential (primary) hypertension: Secondary | ICD-10-CM | POA: Insufficient documentation

## 2018-11-10 ENCOUNTER — Other Ambulatory Visit (HOSPITAL_COMMUNITY)
Admission: RE | Admit: 2018-11-10 | Discharge: 2018-11-10 | Disposition: A | Discharge status: Home / Self Care | Payer: Medicare HMO | Source: Ambulatory Visit | Attending: Family Medicine | Admitting: Family Medicine

## 2018-11-10 ENCOUNTER — Other Ambulatory Visit: Payer: Self-pay

## 2018-11-10 DIAGNOSIS — E559 Vitamin D deficiency, unspecified: Secondary | ICD-10-CM | POA: Diagnosis not present

## 2018-11-10 DIAGNOSIS — Z96659 Presence of unspecified artificial knee joint: Secondary | ICD-10-CM | POA: Diagnosis not present

## 2018-11-10 DIAGNOSIS — E662 Morbid (severe) obesity with alveolar hypoventilation: Secondary | ICD-10-CM | POA: Diagnosis not present

## 2018-11-10 DIAGNOSIS — I10 Essential (primary) hypertension: Secondary | ICD-10-CM | POA: Insufficient documentation

## 2018-11-10 DIAGNOSIS — Z4789 Encounter for other orthopedic aftercare: Secondary | ICD-10-CM | POA: Diagnosis not present

## 2018-11-10 DIAGNOSIS — R7301 Impaired fasting glucose: Secondary | ICD-10-CM | POA: Diagnosis not present

## 2018-11-10 LAB — BASIC METABOLIC PANEL
Anion gap: 12 (ref 5–15)
BUN: 15 mg/dL (ref 8–23)
CO2: 30 mmol/L (ref 22–32)
Calcium: 8.6 mg/dL — ABNORMAL LOW (ref 8.9–10.3)
Chloride: 99 mmol/L (ref 98–111)
Creatinine, Ser: 0.56 mg/dL (ref 0.44–1.00)
GFR calc Af Amer: >60 mL/min (ref >60)
GFR calc non Af Amer: >60 mL/min (ref >60)
Glucose, Bld: 97 mg/dL (ref 70–99)
Potassium: 4 mmol/L (ref 3.5–5.1)
Sodium: 141 mmol/L (ref 135–145)

## 2018-11-10 LAB — HEMOGLOBIN A1C
Hgb A1c MFr Bld: 5.7 % — ABNORMAL HIGH (ref 4.8–5.6)
Mean Plasma Glucose: 116.89 mg/dL

## 2018-11-11 ENCOUNTER — Other Ambulatory Visit: Payer: Self-pay

## 2018-11-11 ENCOUNTER — Ambulatory Visit (HOSPITAL_COMMUNITY)
Admission: RE | Admit: 2018-11-11 | Discharge: 2018-11-11 | Disposition: A | Discharge status: Home / Self Care | Payer: Medicare HMO | Source: Ambulatory Visit | Attending: Pulmonary Disease | Admitting: Pulmonary Disease

## 2018-11-11 DIAGNOSIS — J984 Other disorders of lung: Secondary | ICD-10-CM | POA: Diagnosis not present

## 2018-11-11 LAB — PULMONARY FUNCTION TEST
DL/VA % pred: 127 %
DL/VA: 5.33 ml/min/mmHg/L
DLCO unc % pred: 81 %
DLCO unc: 14.81 ml/min/mmHg
FEF 25-75 Post: 0.72 L/sec
FEF 25-75 Pre: 1.05 L/sec
FEF2575-%Change-Post: -31 %
FEF2575-%Pred-Post: 47 %
FEF2575-%Pred-Pre: 69 %
FEV1-%Change-Post: -5 %
FEV1-%Pred-Post: 72 %
FEV1-%Pred-Pre: 76 %
FEV1-Post: 1.19 L
FEV1-Pre: 1.26 L
FEV1FVC-%Change-Post: -2 %
FEV1FVC-%Pred-Pre: 99 %
FEV6-%Change-Post: -1 %
FEV6-%Pred-Post: 76 %
FEV6-%Pred-Pre: 77 %
FEV6-Post: 1.55 L
FEV6-Pre: 1.57 L
FEV6FVC-%Change-Post: 2 %
FEV6FVC-%Pred-Post: 102 %
FEV6FVC-%Pred-Pre: 100 %
FVC-%Change-Post: -3 %
FVC-%Pred-Post: 74 %
FVC-%Pred-Pre: 77 %
FVC-Post: 1.58 L
FVC-Pre: 1.63 L
Post FEV1/FVC ratio: 75 %
Post FEV6/FVC ratio: 98 %
Pre FEV1/FVC ratio: 77 %
Pre FEV6/FVC Ratio: 96 %

## 2018-11-11 LAB — VITAMIN D 25 HYDROXY (VIT D DEFICIENCY, FRACTURES): Vit D, 25-Hydroxy: 9.5 ng/mL — ABNORMAL LOW (ref 30.0–100.0)

## 2018-11-11 MED ORDER — ALBUTEROL SULFATE (2.5 MG/3ML) 0.083% IN NEBU
2.5000 mg | INHALATION_SOLUTION | Freq: Once | RESPIRATORY_TRACT | Status: AC
  Administered 2018-11-11: 2.5 mg via RESPIRATORY_TRACT

## 2018-11-17 IMAGING — DX DG CHEST 2V
2 series · 2 of 2 positions shown · non-contrast
Comparison: 04/25/2017

CLINICAL DATA: Shortness of breath and cough for 2 weeks

EXAM:
CHEST  2 VIEW

[chest pa]
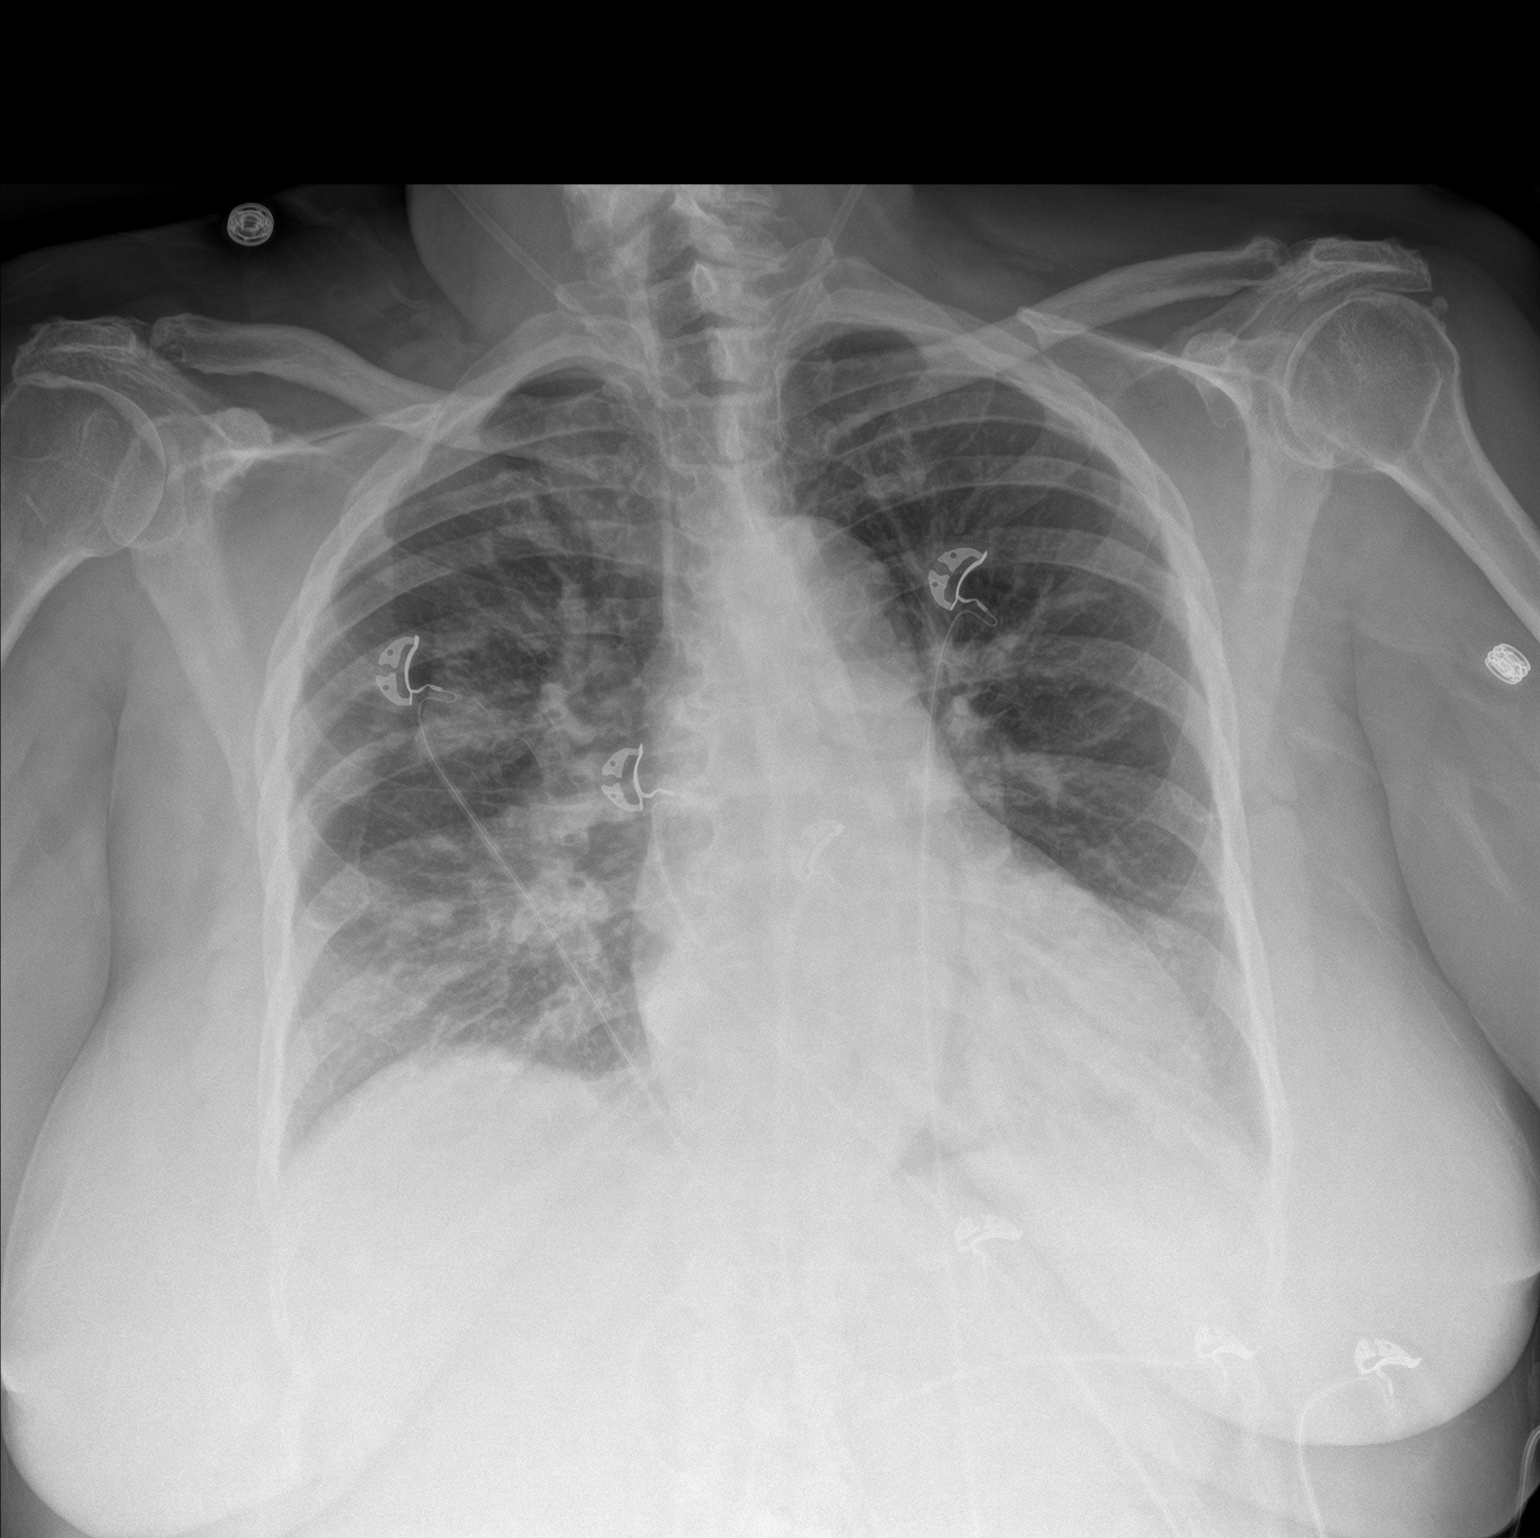

[chest lat]
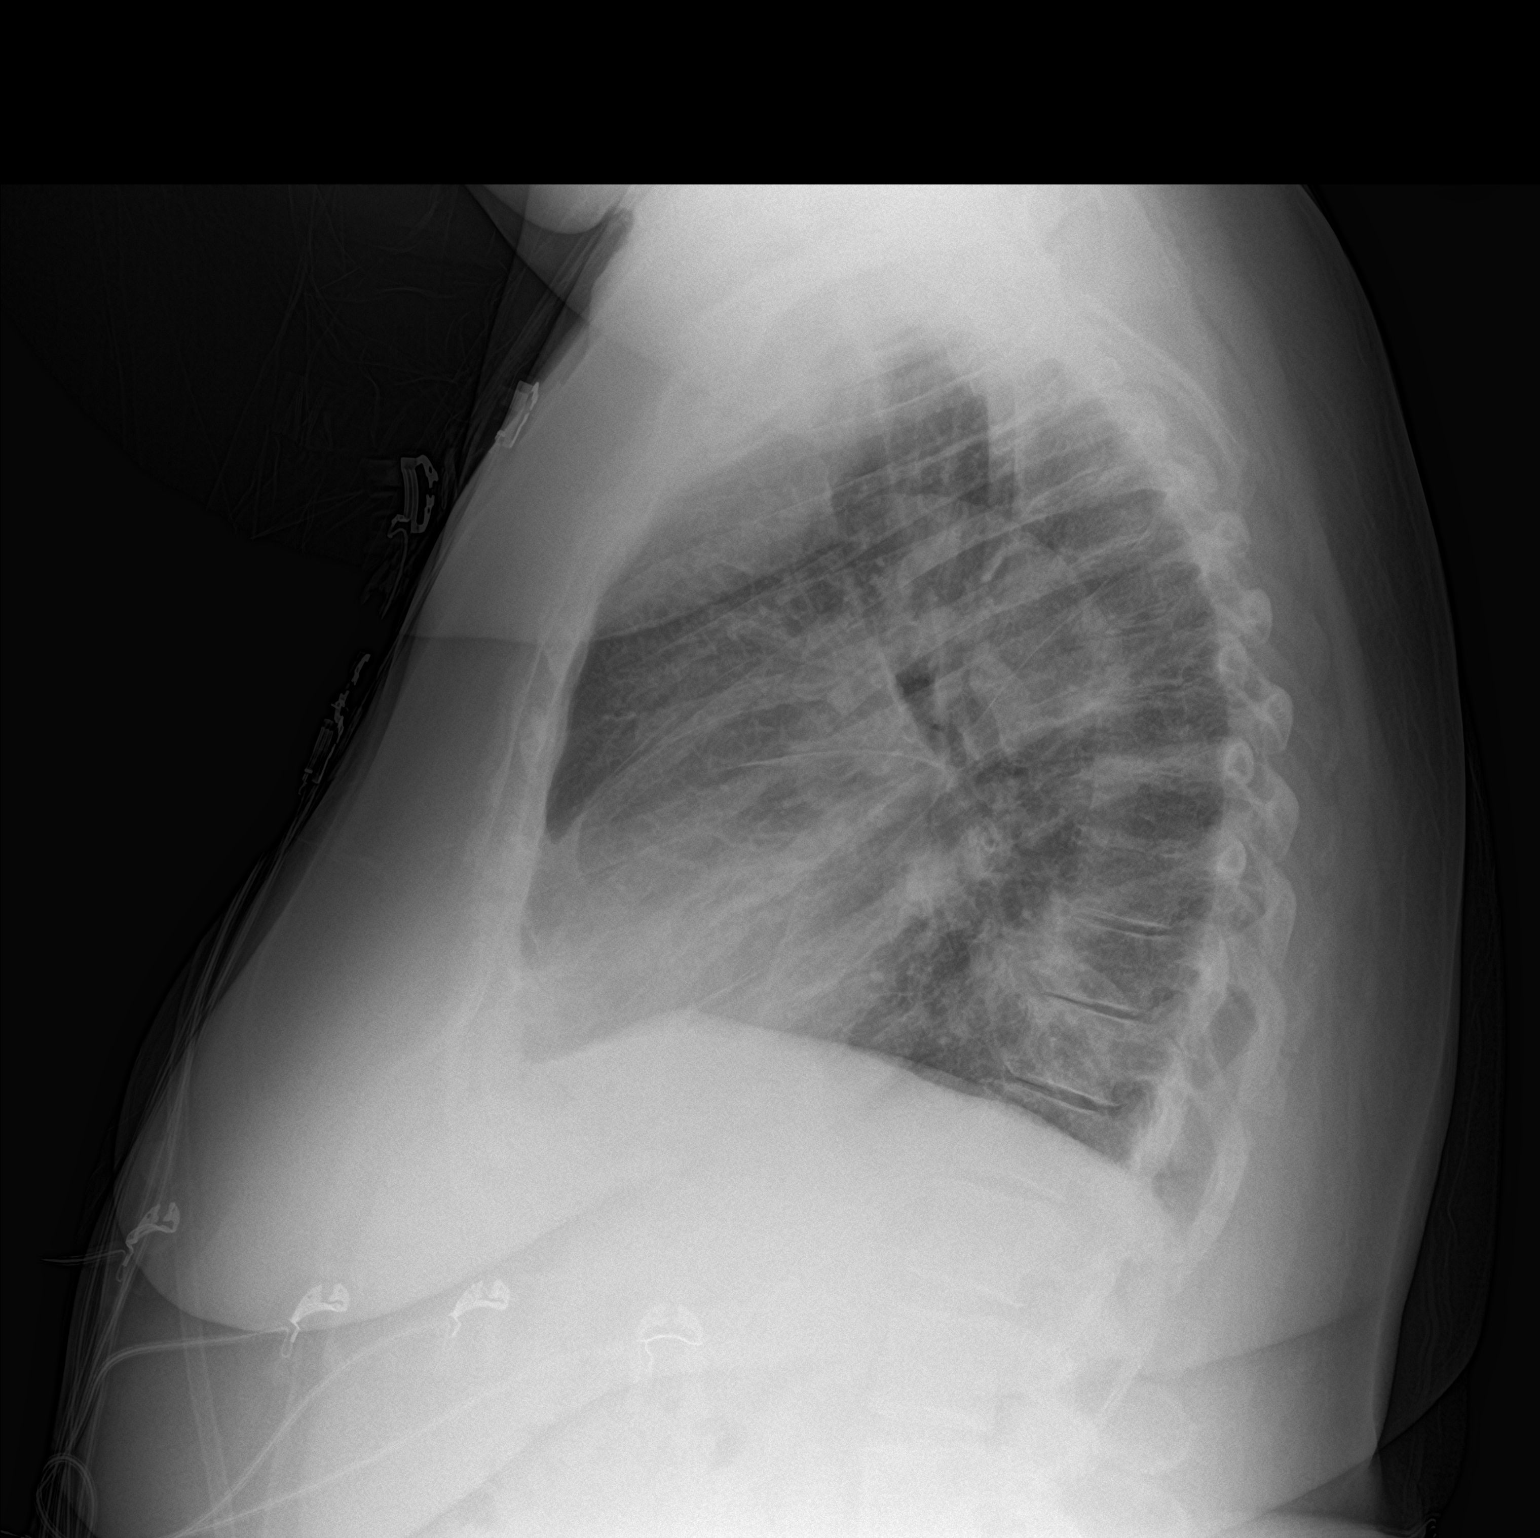

[2 of 2 positions shown; findings below may reference images not displayed]

FINDINGS: Borderline cardiomegaly. There is new patchy density in the
retrocardiac lung and right perihilar region. No Kerley lines,
effusion, or air leak. Mild aortic tortuosity.
IMPRESSION: Patchy bilateral lung opacity primarily concerning for
bronchopneumonia.

## 2018-12-03 DIAGNOSIS — G4733 Obstructive sleep apnea (adult) (pediatric): Secondary | ICD-10-CM | POA: Diagnosis not present

## 2018-12-03 DIAGNOSIS — J9611 Chronic respiratory failure with hypoxia: Secondary | ICD-10-CM | POA: Diagnosis not present

## 2018-12-03 DIAGNOSIS — I10 Essential (primary) hypertension: Secondary | ICD-10-CM | POA: Diagnosis not present

## 2018-12-03 DIAGNOSIS — J449 Chronic obstructive pulmonary disease, unspecified: Secondary | ICD-10-CM | POA: Diagnosis not present

## 2018-12-04 ENCOUNTER — Other Ambulatory Visit: Payer: Self-pay

## 2018-12-04 ENCOUNTER — Other Ambulatory Visit (HOSPITAL_COMMUNITY)
Admission: RE | Admit: 2018-12-04 | Discharge: 2018-12-04 | Disposition: A | Discharge status: Home / Self Care | Payer: Medicare HMO | Source: Ambulatory Visit | Attending: Neurology | Admitting: Neurology

## 2018-12-04 DIAGNOSIS — Z1159 Encounter for screening for other viral diseases: Secondary | ICD-10-CM | POA: Diagnosis not present

## 2018-12-04 DIAGNOSIS — Z01812 Encounter for preprocedural laboratory examination: Secondary | ICD-10-CM | POA: Diagnosis not present

## 2018-12-04 LAB — SARS CORONAVIRUS 2 (TAT 6-24 HRS): SARS Coronavirus 2: NEGATIVE

## 2018-12-08 ENCOUNTER — Ambulatory Visit (INDEPENDENT_AMBULATORY_CARE_PROVIDER_SITE_OTHER): Payer: Medicare HMO | Admitting: Neurology

## 2018-12-08 DIAGNOSIS — Z9981 Dependence on supplemental oxygen: Secondary | ICD-10-CM

## 2018-12-08 DIAGNOSIS — G4734 Idiopathic sleep related nonobstructive alveolar hypoventilation: Secondary | ICD-10-CM

## 2018-12-08 DIAGNOSIS — G4733 Obstructive sleep apnea (adult) (pediatric): Secondary | ICD-10-CM | POA: Diagnosis not present

## 2018-12-08 DIAGNOSIS — R0601 Orthopnea: Secondary | ICD-10-CM

## 2018-12-10 DIAGNOSIS — E662 Morbid (severe) obesity with alveolar hypoventilation: Secondary | ICD-10-CM | POA: Diagnosis not present

## 2018-12-10 DIAGNOSIS — Z96659 Presence of unspecified artificial knee joint: Secondary | ICD-10-CM | POA: Diagnosis not present

## 2018-12-10 DIAGNOSIS — Z4789 Encounter for other orthopedic aftercare: Secondary | ICD-10-CM | POA: Diagnosis not present

## 2018-12-15 DIAGNOSIS — Z9981 Dependence on supplemental oxygen: Secondary | ICD-10-CM | POA: Insufficient documentation

## 2018-12-15 DIAGNOSIS — Z6841 Body Mass Index (BMI) 40.0 and over, adult: Secondary | ICD-10-CM | POA: Insufficient documentation

## 2018-12-15 DIAGNOSIS — G4734 Idiopathic sleep related nonobstructive alveolar hypoventilation: Secondary | ICD-10-CM | POA: Insufficient documentation

## 2018-12-15 NOTE — Procedures (Signed)
PATIENT'S NAME:  Patricia Fuentes, Patricia Fuentes DOB:      10/24/2016      MR#:    [id number]     DATE OF RECORDING: 12/08/2018  by Dixon Boos REFERRING M.D.:  Bronson Ing, MD Cardiology Study Performed:  Titration to Positive Airway Pressure and oxygen if needed- oxygen dependent patient. This patient is a 73year old Female with a history of prolonged apnea and hypoxemia noted after surgery. The patient was discharged on oxygen. She uses 2 liters by nasal cannula. This CPAP study followed her baseline PSG from 10-23-2018 with the following results:  Obstructive Sleep Apnea (OSA) at AHI 21.5/h and strong accentuation during REM sleep to over 50 AHI, Snoring, Sleep hypoxia- severe, was needing 2 liters of O2. During REM sleep, there was a Nadir SpO2 of 59%. I ordered a full-night, attended, CPAP titration study to optimize therapy and allow for titration on Oxygen if needed.  The patient endorsed the Epworth Sleepiness Scale at 3/24 points.   The patient's weight 230 pounds with a height of 62 (inches), resulting in a BMI of 42.2 kg/m2. The patient's neck circumference measured 15.5 inches.  CURRENT MEDICATIONS: Proventil, Norvasc, Aspirin, Flonase.   PROCEDURE:  This is a multichannel digital polysomnogram utilizing the SomnoStar 11.2 system.  Electrodes and sensors were applied and monitored per AASM Specifications.   EEG, EOG, Chin and Limb EMG, were sampled at 200 Hz.  ECG, Snore and Nasal Pressure, Thermal Airflow, Respiratory Effort, CPAP Flow and Pressure, Oximetry was sampled at 50 Hz. Digital video and audio were recorded.       CPAP was initiated at 5 cmH20 with heated humidity per AASM split night standards and pressure was advanced to 10 cmH20 because of hypopneas, apneas and oxygen desaturations.   The patient remained hypoxemic for much of the titration study.  At a PAP pressure of 14 cmH20, there was a reduction of the AHI to 0 with improvement of sleep apnea. The technologist did not use oxygen to improve  the hypoxemia as ordered. The patient remained 115 minutes in hypoxia under his care.   Lights Out was at 22:03 and Lights On at 05:00. Total recording time (TRT) was 417 minutes, with a total sleep time (TST) of 200.5 minutes. The patient's sleep latency was 52 minutes. REM latency was 172 minutes.  The sleep efficiency was 48.1 %.    SLEEP ARCHITECTURE: WASO (Wake after sleep onset) was 179 minutes.  There were 14 minutes in Stage N1, 142 minutes Stage N2, 0 minutes Stage N3 and 44.5 minutes in Stage REM.  The percentage of Stage N1 was 7.0%, Stage N2 was 70.8%, Stage N3 was 0% and Stage R (REM sleep) was 22.2%.   RESPIRATORY ANALYSIS:  There was a total of 58 respiratory events: 2 obstructive apneas, 0 central apneas and 0 mixed apneas with a total of 2 apneas and an apnea index (AI) of .6 /hour. There were 56 hypopneas with a hypopnea index of 16.8/hour.   The total APNEA/HYPOPNEA INDEX (AHI) was still 17.4 /hour. 9 events occurred in REM sleep and 49 events in NREM. The REM AHI was 12.1 /hour versus a non-REM AHI of 18.8 /hour.  The patient spent 72 minutes of total sleep time in the supine position and 129 minutes in non-supine. The supine AHI was 24.2, versus a non-supine AHI of 13.5.  OXYGEN SATURATION:  The baseline 02 saturation was 94%, with the lowest being 66%. Time spent below 89% saturation equaled 115 minutes.  The arousals  were noted as: 26 were spontaneous, 3 were associated with PLMs, and only 8 were associated with respiratory events. The patient had a total of 16 Periodic Limb Movements. The Periodic Limb Movement (PLM) index was 4.8 and the PLM Arousal index was 0.9 /hour. Audio and video analysis did not show any abnormal or unusual movements, behaviors, phonations or vocalizations.  No Snoring was noted. EKG was in keeping with normal sinus rhythm (NSR).  The patient could not sleep well until 2 AM without sufficient pressure on CPAP and without oxygen being given. The patient  was fitted with a ResMed model FFM - a Mirage Quattro in Small.  DIAGNOSIS 1. OSA controlled under CPAP but Sleep Related Hypoxemia persisted and remained with a nadir of 87% under final CPAP pressure of 14 cm water. Please note that the patient slept non supine under 14 cm water.   PLANS/RECOMMENDATIONS:  Autotitration capable CPAP 8-16 cm water, 1 cm EPR and heated humidity with mask of patients choice. Oxygen at 2 liters will be bled into CPAP.  1. CPAP therapy compliance is defined as 4 hours or more of nightly use. 2. The patient should avoid evening sedatives, hypnotics, and alcohol beverage consumption.   A follow up appointment will be scheduled in the Sleep Clinic at [facility].   Please call [phone] with any questions.     I certify that I have reviewed the entire raw data recording prior to the issuance of this report in accordance with the Standards of Accreditation of the American Academy of Sleep Medicine (AASM)    Melvyn Novasarmen Salah Nakamura, M.D.  12-15-2018  Diplomat, American Board of Psychiatry and Neurology  Diplomat, American Board of Sleep Medicine Medical Director, [facility]

## 2018-12-15 NOTE — Addendum Note (Signed)
Addended by: Larey Seat on: 12/15/2018 06:13 PM   Modules accepted: Orders

## 2018-12-17 ENCOUNTER — Telehealth: Payer: Self-pay | Admitting: Neurology

## 2018-12-17 NOTE — Telephone Encounter (Signed)
-----   Message from Larey Seat, MD sent at 12/15/2018  6:13 PM EDT ----- DIAGNOSIS  1. OSA controlled under CPAP but Sleep Related Hypoxemia  persisted and remained with a nadir of 87% under final CPAP  pressure of 14 cm water. Please note that the patient slept non  supine under 14 cm water.   PLANS/RECOMMENDATIONS:   Autotitration capable CPAP 8-16 cm water, 1 cm EPR and heated  humidity with mask of patients choice. Oxygen at 2 liters will be  bled into CPAP.  1. CPAP therapy compliance is defined as 4 hours or more of  nightly use.  2. The patient should avoid supine sleep position, evening sedatives, hypnotics, and  alcoholic beverages .

## 2018-12-17 NOTE — Telephone Encounter (Signed)
Pt has called RN back, she is asking for a call back

## 2018-12-17 NOTE — Telephone Encounter (Signed)
I called pt. I advised pt that Dr. Brett Fairy reviewed their sleep study results and found that pt sleep apnea. Dr. Brett Fairy recommends that pt use auto CPAP 5-18 cm water pressure with oxygen bled into the machine. I reviewed PAP compliance expectations with the pt. Pt is agreeable to starting a CPAP. I advised pt that an order will be sent to a DME, [facility] and [facility] will call the pt within about one week after they file with the pt's insurance. [facility] will show the pt how to use the machine, fit for masks, and troubleshoot the CPAP if needed. A follow up appt was made for insurance purposes with Debbora Presto, NP on 02/16/2019 at 1:30 pm. Pt verbalized understanding to arrive 15 minutes early and bring their CPAP. A letter with all of this information in it will be mailed to the pt as a reminder. I verified with the pt that the address we have on file is correct. Pt verbalized understanding of results. Pt had no questions at this time but was encouraged to call back if questions arise. I have sent the order to [facility] and have received confirmation that they have received the order.

## 2018-12-17 NOTE — Telephone Encounter (Signed)
Called patient to discuss sleep study results. No answer at this time. LVM for the patient to call back.   

## 2019-01-04 ENCOUNTER — Other Ambulatory Visit: Payer: Self-pay | Admitting: Family Medicine

## 2019-01-07 DIAGNOSIS — Z96659 Presence of unspecified artificial knee joint: Secondary | ICD-10-CM | POA: Diagnosis not present

## 2019-01-07 DIAGNOSIS — E662 Morbid (severe) obesity with alveolar hypoventilation: Secondary | ICD-10-CM | POA: Diagnosis not present

## 2019-01-07 DIAGNOSIS — G4733 Obstructive sleep apnea (adult) (pediatric): Secondary | ICD-10-CM | POA: Diagnosis not present

## 2019-01-10 DIAGNOSIS — Z4789 Encounter for other orthopedic aftercare: Secondary | ICD-10-CM | POA: Diagnosis not present

## 2019-01-10 DIAGNOSIS — E662 Morbid (severe) obesity with alveolar hypoventilation: Secondary | ICD-10-CM | POA: Diagnosis not present

## 2019-01-10 DIAGNOSIS — Z96659 Presence of unspecified artificial knee joint: Secondary | ICD-10-CM | POA: Diagnosis not present

## 2019-02-07 DIAGNOSIS — Z96659 Presence of unspecified artificial knee joint: Secondary | ICD-10-CM | POA: Diagnosis not present

## 2019-02-07 DIAGNOSIS — E662 Morbid (severe) obesity with alveolar hypoventilation: Secondary | ICD-10-CM | POA: Diagnosis not present

## 2019-02-07 DIAGNOSIS — G4733 Obstructive sleep apnea (adult) (pediatric): Secondary | ICD-10-CM | POA: Diagnosis not present

## 2019-02-10 DIAGNOSIS — Z4789 Encounter for other orthopedic aftercare: Secondary | ICD-10-CM | POA: Diagnosis not present

## 2019-02-10 DIAGNOSIS — Z96659 Presence of unspecified artificial knee joint: Secondary | ICD-10-CM | POA: Diagnosis not present

## 2019-02-10 DIAGNOSIS — E662 Morbid (severe) obesity with alveolar hypoventilation: Secondary | ICD-10-CM | POA: Diagnosis not present

## 2019-02-16 ENCOUNTER — Ambulatory Visit: Payer: Self-pay | Admitting: Family Medicine

## 2019-02-24 ENCOUNTER — Ambulatory Visit: Payer: Self-pay | Admitting: Family Medicine

## 2019-02-24 ENCOUNTER — Telehealth: Payer: Self-pay

## 2019-02-24 NOTE — Telephone Encounter (Signed)
Patient no show for appt today. 

## 2019-02-25 ENCOUNTER — Other Ambulatory Visit: Payer: Self-pay | Admitting: Family Medicine

## 2019-03-01 ENCOUNTER — Encounter: Payer: Self-pay | Admitting: Family Medicine

## 2019-03-01 ENCOUNTER — Ambulatory Visit: Payer: Medicare HMO | Admitting: Family Medicine

## 2019-03-02 ENCOUNTER — Ambulatory Visit: Payer: Medicare HMO | Admitting: Family Medicine

## 2019-03-02 ENCOUNTER — Other Ambulatory Visit: Payer: Self-pay

## 2019-03-09 DIAGNOSIS — E662 Morbid (severe) obesity with alveolar hypoventilation: Secondary | ICD-10-CM | POA: Diagnosis not present

## 2019-03-09 DIAGNOSIS — G4733 Obstructive sleep apnea (adult) (pediatric): Secondary | ICD-10-CM | POA: Diagnosis not present

## 2019-03-09 DIAGNOSIS — Z96659 Presence of unspecified artificial knee joint: Secondary | ICD-10-CM | POA: Diagnosis not present

## 2019-03-10 ENCOUNTER — Other Ambulatory Visit: Payer: Self-pay

## 2019-03-10 ENCOUNTER — Ambulatory Visit (INDEPENDENT_AMBULATORY_CARE_PROVIDER_SITE_OTHER): Payer: Medicare HMO | Admitting: Family Medicine

## 2019-03-10 ENCOUNTER — Encounter: Payer: Self-pay | Admitting: Family Medicine

## 2019-03-10 ENCOUNTER — Encounter (INDEPENDENT_AMBULATORY_CARE_PROVIDER_SITE_OTHER): Payer: Self-pay

## 2019-03-10 VITALS — BP 140/82 | HR 68 | Temp 97.5°F | Resp 15 | Ht 62.5 in | Wt 243.0 lb

## 2019-03-10 DIAGNOSIS — I10 Essential (primary) hypertension: Secondary | ICD-10-CM | POA: Diagnosis not present

## 2019-03-10 DIAGNOSIS — G4730 Sleep apnea, unspecified: Secondary | ICD-10-CM | POA: Diagnosis not present

## 2019-03-10 DIAGNOSIS — G4710 Hypersomnia, unspecified: Secondary | ICD-10-CM | POA: Diagnosis not present

## 2019-03-10 DIAGNOSIS — Z23 Encounter for immunization: Secondary | ICD-10-CM

## 2019-03-10 DIAGNOSIS — E785 Hyperlipidemia, unspecified: Secondary | ICD-10-CM

## 2019-03-10 DIAGNOSIS — E559 Vitamin D deficiency, unspecified: Secondary | ICD-10-CM | POA: Diagnosis not present

## 2019-03-10 DIAGNOSIS — R7303 Prediabetes: Secondary | ICD-10-CM | POA: Diagnosis not present

## 2019-03-10 DIAGNOSIS — Z20822 Contact with and (suspected) exposure to covid-19: Secondary | ICD-10-CM

## 2019-03-10 MED ORDER — AMLODIPINE BESYLATE 10 MG PO TABS: 10.0000 mg | ORAL_TABLET | Freq: Every day | ORAL | 3 refills | Status: DC

## 2019-03-10 MED ORDER — ERGOCALCIFEROL 1.25 MG (50000 UT) PO CAPS: 50000.0000 [IU] | ORAL_CAPSULE | ORAL | 1 refills | Status: DC

## 2019-03-10 MED ORDER — AMLODIPINE BESYLATE 2.5 MG PO TABS: 2.5000 mg | ORAL_TABLET | Freq: Every day | ORAL | 5 refills | Status: DC

## 2019-03-10 NOTE — Patient Instructions (Addendum)
Annual physical exam with MD end Jan/ early Feb, when due, call if you need me before  Flu vaccine today  New dose of amlodipine for BP is 10 mg ONE daily ( stop 32m and 2.5 mg )  Start once weekly vit D3 , which is a prescription  fastig cBC, lipid, cmp and eGFR,, and VitD 5 days before next appointment  [facility] consider it a privelige to serve you. Think about what you will eat, plan ahead. Choose " clean, green, fresh or frozen" over canned, processed or packaged foods which are more sugary, salty and fatty. 70 to 75% of food eaten should be vegetables and fruit. Three meals at set times with snacks allowed between meals, but they must be fruit or vegetables. Aim to eat over a 12 hour period , example 7 am to 7 pm, and STOP after  your last meal of the day. Drink water,generally about 64 ounces per day, no other drink is as healthy. Fruit juice is best enjoyed in a healthy way, by EATING the fruit.  [facility] consider it a privelige to serve you.

## 2019-03-11 LAB — NOVEL CORONAVIRUS, NAA: SARS-CoV-2, NAA: NOT DETECTED

## 2019-03-12 DIAGNOSIS — Z96659 Presence of unspecified artificial knee joint: Secondary | ICD-10-CM | POA: Diagnosis not present

## 2019-03-12 DIAGNOSIS — E662 Morbid (severe) obesity with alveolar hypoventilation: Secondary | ICD-10-CM | POA: Diagnosis not present

## 2019-03-12 DIAGNOSIS — Z4789 Encounter for other orthopedic aftercare: Secondary | ICD-10-CM | POA: Diagnosis not present

## 2019-03-13 ENCOUNTER — Encounter: Payer: Self-pay | Admitting: Family Medicine

## 2019-03-13 NOTE — Progress Notes (Signed)
Patricia Fuentes     MRN: [id number]      DOB: Oct 05, 1945   HPI [name] is here for follow up and re-evaluation of chronic medical conditions, medication management and review of any available recent lab and radiology data.  Preventive health is updated, specifically  Cancer screening and Immunization.   The PT denies any adverse reactions to current medications since the last visit. Reports uncontrolled hypertension and is here to have this addressed C/o excessive eating in particular of snacks, with significant weight gain in past 6 months, needs to address this  ROS Denies recent fever or chills. Denies sinus pressure, nasal congestion, ear pain or sore throat. Denies chest congestion, productive cough or wheezing. Denies chest pains, palpitations and leg swelling Denies abdominal pain, nausea, vomiting,diarrhea or constipation.   Denies dysuria, frequency, hesitancy or incontinence. Denies uncontrolled joint pain, swelling and limitation in mobility. Denies headaches, seizures, numbness, or tingling. Denies depression, anxiety or insomnia. Denies skin break down or rash.   PE  BP 140/82   Pulse 68   Temp (!) 97.5 F (36.4 C) (Temporal)   Resp 15   Ht 5' 2.5" (1.588 m)   Wt 243 lb (110.2 kg)   SpO2 96%   BMI 43.74 kg/m   Patient alert and oriented and in no cardiopulmonary distress.  HEENT: No facial asymmetry, EOMI,     Neck supple .  Chest: Clear to auscultation bilaterally.  CVS: S1, S2 no murmurs, no S3.Regular rate.  ABD: Soft non tender.   Ext: No edema  MS: Adequate ROM spine, shoulders, hips and reduced in  knees.  Skin: Intact, no ulcerations or rash noted.  Psych: Good eye contact, normal affect. Memory intact not anxious or depressed appearing.  CNS: CN 2-12 intact, power,  normal throughout.no focal deficits noted.   Assessment & Plan  Essential hypertension Uncontrolled , increase dose of amlodipine, with close f/u  DASH diet and  commitment to daily physical activity for a minimum of 30 minutes discussed and encouraged, as a part of hypertension management. The importance of attaining a healthy weight is also discussed.  BP/Weight 03/10/2019 10/22/2018 09/24/2018 09/17/2018 09/03/2018 07/13/2018 06/26/2018  Systolic BP 140 132 140 - 133 858 144  Diastolic BP 82 70 78 - 72 73 76  Wt. (Lbs) 243 228 228.8 230 - 240 243  BMI 43.74 41.17 41.31 41.53 - 43.9 44.45       Morbid obesity (HCC)  Patient re-educated about  the importance of commitment to a  minimum of 150 minutes of exercise per week as able.  The importance of healthy food choices with portion control discussed, as well as eating regularly and within a 12 hour window most days. The need to choose "clean , green" food 50 to 75% of the time is discussed, as well as to make water the primary drink and set a goal of 64 ounces water daily.    Weight /BMI 03/10/2019 10/22/2018 09/24/2018  WEIGHT 243 lb 228 lb 228 lb 12.8 oz  HEIGHT 5' 2.5" 5' 2.4" -  BMI 43.74 kg/m2 41.17 kg/m2 41.31 kg/m2      Hypersomnia with sleep apnea Ongoing use of CPAP stressed, reports that she is intolerant of current equipment, needs to see prescribing Md for reassesment  Prediabetes Patient educated about the importance of limiting  Carbohydrate intake , the need to commit to daily physical activity for a minimum of 30 minutes , and to commit weight loss. The fact that  changes in all these areas will reduce or eliminate all together the development of diabetes is stressed.   Diabetic Labs Latest Ref Rng & Units 11/10/2018 06/23/2018 06/09/2018 06/08/2018 04/30/2018  HbA1c 4.8 - 5.6 % 5.7(H) - - - 5.7(H)  Chol 0 - 200 mg/dL - - - - 164  HDL >40 mg/dL - - - - 59  Calc LDL 0 - 99 mg/dL - - - - 96  Triglycerides <150 mg/dL - - - - 47  Creatinine 0.44 - 1.00 mg/dL 0.56 0.67 0.61 0.66 0.60   BP/Weight 03/10/2019 10/22/2018 09/24/2018 09/17/2018 09/03/2018 07/13/2018 8/46/9629  Systolic BP 528  413 244 - 010 272 536  Diastolic BP 82 70 78 - 72 73 76  Wt. (Lbs) 243 228 228.8 230 - 240 243  BMI 43.74 41.17 41.31 41.53 - 43.9 44.45   No flowsheet data found.    Vitamin D deficiency Updated lab needed at/ before next visit.

## 2019-03-13 NOTE — Assessment & Plan Note (Signed)
  Patient re-educated about  the importance of commitment to a  minimum of 150 minutes of exercise per week as able.  The importance of healthy food choices with portion control discussed, as well as eating regularly and within a 12 hour window most days. The need to choose "clean , green" food 50 to 75% of the time is discussed, as well as to make water the primary drink and set a goal of 64 ounces water daily.    Weight /BMI 03/10/2019 10/22/2018 09/24/2018  WEIGHT 243 lb 228 lb 228 lb 12.8 oz  HEIGHT 5' 2.5" 5' 2.4" -  BMI 43.74 kg/m2 41.17 kg/m2 41.31 kg/m2

## 2019-03-13 NOTE — Assessment & Plan Note (Signed)
Updated lab needed at/ before next visit.   

## 2019-03-13 NOTE — Assessment & Plan Note (Signed)
Uncontrolled , increase dose of amlodipine, with close f/u  DASH diet and commitment to daily physical activity for a minimum of 30 minutes discussed and encouraged, as a part of hypertension management. The importance of attaining a healthy weight is also discussed.  BP/Weight 03/10/2019 10/22/2018 09/24/2018 09/17/2018 09/03/2018 07/13/2018 07/22/3333  Systolic BP 456 256 389 - 373 428 768  Diastolic BP 82 70 78 - 72 73 76  Wt. (Lbs) 243 228 228.8 230 - 240 243  BMI 43.74 41.17 41.31 41.53 - 43.9 44.45

## 2019-03-13 NOTE — Assessment & Plan Note (Signed)
Ongoing use of CPAP stressed, reports that she is intolerant of current equipment, needs to see prescribing Md for reassesment

## 2019-03-13 NOTE — Assessment & Plan Note (Signed)
Patient educated about the importance of limiting  Carbohydrate intake , the need to commit to daily physical activity for a minimum of 30 minutes , and to commit weight loss. The fact that changes in all these areas will reduce or eliminate all together the development of diabetes is stressed.   Diabetic Labs Latest Ref Rng & Units 11/10/2018 06/23/2018 06/09/2018 06/08/2018 04/30/2018  HbA1c 4.8 - 5.6 % 5.7(H) - - - 5.7(H)  Chol 0 - 200 mg/dL - - - - 164  HDL >40 mg/dL - - - - 59  Calc LDL 0 - 99 mg/dL - - - - 96  Triglycerides <150 mg/dL - - - - 47  Creatinine 0.44 - 1.00 mg/dL 0.56 0.67 0.61 0.66 0.60   BP/Weight 03/10/2019 10/22/2018 09/24/2018 09/17/2018 09/03/2018 07/13/2018 11/20/348  Systolic BP 093 818 299 - 371 696 789  Diastolic BP 82 70 78 - 72 73 76  Wt. (Lbs) 243 228 228.8 230 - 240 243  BMI 43.74 41.17 41.31 41.53 - 43.9 44.45   No flowsheet data found.

## 2019-04-05 ENCOUNTER — Other Ambulatory Visit: Payer: Self-pay | Admitting: Cardiology

## 2019-04-05 DIAGNOSIS — Z20822 Contact with and (suspected) exposure to covid-19: Secondary | ICD-10-CM

## 2019-04-06 LAB — NOVEL CORONAVIRUS, NAA: SARS-CoV-2, NAA: NOT DETECTED

## 2019-04-09 DIAGNOSIS — E662 Morbid (severe) obesity with alveolar hypoventilation: Secondary | ICD-10-CM | POA: Diagnosis not present

## 2019-04-09 DIAGNOSIS — Z96659 Presence of unspecified artificial knee joint: Secondary | ICD-10-CM | POA: Diagnosis not present

## 2019-04-09 DIAGNOSIS — G4733 Obstructive sleep apnea (adult) (pediatric): Secondary | ICD-10-CM | POA: Diagnosis not present

## 2019-04-12 DIAGNOSIS — E662 Morbid (severe) obesity with alveolar hypoventilation: Secondary | ICD-10-CM | POA: Diagnosis not present

## 2019-04-12 DIAGNOSIS — Z4789 Encounter for other orthopedic aftercare: Secondary | ICD-10-CM | POA: Diagnosis not present

## 2019-04-12 DIAGNOSIS — Z96659 Presence of unspecified artificial knee joint: Secondary | ICD-10-CM | POA: Diagnosis not present

## 2019-04-28 ENCOUNTER — Other Ambulatory Visit: Payer: Self-pay

## 2019-04-28 DIAGNOSIS — Z20822 Contact with and (suspected) exposure to covid-19: Secondary | ICD-10-CM

## 2019-04-30 LAB — NOVEL CORONAVIRUS, NAA: SARS-CoV-2, NAA: NOT DETECTED

## 2019-05-03 ENCOUNTER — Telehealth: Payer: Self-pay | Admitting: *Deleted

## 2019-05-03 NOTE — Telephone Encounter (Signed)
Pt's husband calling for coivd results, on DPR. Results negative, verbalizes understanding.

## 2019-05-09 DIAGNOSIS — G4733 Obstructive sleep apnea (adult) (pediatric): Secondary | ICD-10-CM | POA: Diagnosis not present

## 2019-05-09 DIAGNOSIS — Z96659 Presence of unspecified artificial knee joint: Secondary | ICD-10-CM | POA: Diagnosis not present

## 2019-05-09 DIAGNOSIS — E662 Morbid (severe) obesity with alveolar hypoventilation: Secondary | ICD-10-CM | POA: Diagnosis not present

## 2019-05-12 DIAGNOSIS — Z4789 Encounter for other orthopedic aftercare: Secondary | ICD-10-CM | POA: Diagnosis not present

## 2019-05-12 DIAGNOSIS — E662 Morbid (severe) obesity with alveolar hypoventilation: Secondary | ICD-10-CM | POA: Diagnosis not present

## 2019-05-12 DIAGNOSIS — Z96659 Presence of unspecified artificial knee joint: Secondary | ICD-10-CM | POA: Diagnosis not present

## 2019-06-09 DIAGNOSIS — E662 Morbid (severe) obesity with alveolar hypoventilation: Secondary | ICD-10-CM | POA: Diagnosis not present

## 2019-06-09 DIAGNOSIS — G4733 Obstructive sleep apnea (adult) (pediatric): Secondary | ICD-10-CM | POA: Diagnosis not present

## 2019-06-09 DIAGNOSIS — Z96659 Presence of unspecified artificial knee joint: Secondary | ICD-10-CM | POA: Diagnosis not present

## 2019-06-12 DIAGNOSIS — E662 Morbid (severe) obesity with alveolar hypoventilation: Secondary | ICD-10-CM | POA: Diagnosis not present

## 2019-06-12 DIAGNOSIS — Z4789 Encounter for other orthopedic aftercare: Secondary | ICD-10-CM | POA: Diagnosis not present

## 2019-06-12 DIAGNOSIS — Z96659 Presence of unspecified artificial knee joint: Secondary | ICD-10-CM | POA: Diagnosis not present

## 2019-06-24 ENCOUNTER — Encounter: Payer: Medicare HMO | Admitting: Family Medicine

## 2019-07-08 ENCOUNTER — Other Ambulatory Visit: Payer: Self-pay

## 2019-07-08 ENCOUNTER — Other Ambulatory Visit (HOSPITAL_COMMUNITY)
Admission: RE | Admit: 2019-07-08 | Discharge: 2019-07-08 | Disposition: A | Discharge status: Home / Self Care | Payer: Medicare HMO | Source: Ambulatory Visit | Attending: Family Medicine | Admitting: Family Medicine

## 2019-07-08 DIAGNOSIS — I10 Essential (primary) hypertension: Secondary | ICD-10-CM | POA: Insufficient documentation

## 2019-07-08 DIAGNOSIS — E559 Vitamin D deficiency, unspecified: Secondary | ICD-10-CM | POA: Diagnosis not present

## 2019-07-08 DIAGNOSIS — E785 Hyperlipidemia, unspecified: Secondary | ICD-10-CM | POA: Insufficient documentation

## 2019-07-08 LAB — CBC
HCT: 38.7 % (ref 36.0–46.0)
Hemoglobin: 11.9 g/dL — ABNORMAL LOW (ref 12.0–15.0)
MCH: 28.1 pg (ref 26.0–34.0)
MCHC: 30.7 g/dL (ref 30.0–36.0)
MCV: 91.5 fL (ref 80.0–100.0)
Platelets: 176 10*3/uL (ref 150–400)
RBC: 4.23 MIL/uL (ref 3.87–5.11)
RDW: 13.5 % (ref 11.5–15.5)
WBC: 6 10*3/uL (ref 4.0–10.5)
nRBC: 0 % (ref 0.0–0.2)

## 2019-07-08 LAB — COMPREHENSIVE METABOLIC PANEL
ALT: 12 U/L (ref 0–44)
AST: 15 U/L (ref 15–41)
Albumin: 3.6 g/dL (ref 3.5–5.0)
Alkaline Phosphatase: 61 U/L (ref 38–126)
Anion gap: 11 (ref 5–15)
BUN: 14 mg/dL (ref 8–23)
CO2: 29 mmol/L (ref 22–32)
Calcium: 8.5 mg/dL — ABNORMAL LOW (ref 8.9–10.3)
Chloride: 98 mmol/L (ref 98–111)
Creatinine, Ser: 0.6 mg/dL (ref 0.44–1.00)
GFR calc Af Amer: >60 mL/min (ref >60)
GFR calc non Af Amer: >60 mL/min (ref >60)
Glucose, Bld: 124 mg/dL — ABNORMAL HIGH (ref 70–99)
Potassium: 3.6 mmol/L (ref 3.5–5.1)
Sodium: 138 mmol/L (ref 135–145)
Total Bilirubin: 0.5 mg/dL (ref 0.3–1.2)
Total Protein: 7.7 g/dL (ref 6.5–8.1)

## 2019-07-08 LAB — LIPID PANEL
Cholesterol: 177 mg/dL (ref 0–200)
HDL: 57 mg/dL (ref >40)
LDL Cholesterol: 106 mg/dL — ABNORMAL HIGH (ref 0–99)
Total CHOL/HDL Ratio: 3.1 RATIO
Triglycerides: 70 mg/dL (ref <150)
VLDL: 14 mg/dL (ref 0–40)

## 2019-07-08 LAB — VITAMIN D 25 HYDROXY (VIT D DEFICIENCY, FRACTURES): Vit D, 25-Hydroxy: 20.26 ng/mL — ABNORMAL LOW (ref 30–100)

## 2019-07-10 DIAGNOSIS — G4733 Obstructive sleep apnea (adult) (pediatric): Secondary | ICD-10-CM | POA: Diagnosis not present

## 2019-07-10 DIAGNOSIS — Z96659 Presence of unspecified artificial knee joint: Secondary | ICD-10-CM | POA: Diagnosis not present

## 2019-07-10 DIAGNOSIS — E662 Morbid (severe) obesity with alveolar hypoventilation: Secondary | ICD-10-CM | POA: Diagnosis not present

## 2019-07-13 DIAGNOSIS — E662 Morbid (severe) obesity with alveolar hypoventilation: Secondary | ICD-10-CM | POA: Diagnosis not present

## 2019-07-13 DIAGNOSIS — Z4789 Encounter for other orthopedic aftercare: Secondary | ICD-10-CM | POA: Diagnosis not present

## 2019-07-13 DIAGNOSIS — Z96659 Presence of unspecified artificial knee joint: Secondary | ICD-10-CM | POA: Diagnosis not present

## 2019-07-15 ENCOUNTER — Encounter: Payer: Medicare HMO | Admitting: Family Medicine

## 2019-07-16 ENCOUNTER — Encounter: Payer: Self-pay | Admitting: Orthopedic Surgery

## 2019-07-19 ENCOUNTER — Ambulatory Visit: Payer: Self-pay | Admitting: Orthopedic Surgery

## 2019-07-29 ENCOUNTER — Encounter: Payer: Medicare HMO | Admitting: Family Medicine

## 2019-08-07 DIAGNOSIS — Z96659 Presence of unspecified artificial knee joint: Secondary | ICD-10-CM | POA: Diagnosis not present

## 2019-08-07 DIAGNOSIS — E662 Morbid (severe) obesity with alveolar hypoventilation: Secondary | ICD-10-CM | POA: Diagnosis not present

## 2019-08-07 DIAGNOSIS — G4733 Obstructive sleep apnea (adult) (pediatric): Secondary | ICD-10-CM | POA: Diagnosis not present

## 2019-08-10 DIAGNOSIS — Z4789 Encounter for other orthopedic aftercare: Secondary | ICD-10-CM | POA: Diagnosis not present

## 2019-08-10 DIAGNOSIS — Z96659 Presence of unspecified artificial knee joint: Secondary | ICD-10-CM | POA: Diagnosis not present

## 2019-08-10 DIAGNOSIS — E662 Morbid (severe) obesity with alveolar hypoventilation: Secondary | ICD-10-CM | POA: Diagnosis not present

## 2019-09-06 ENCOUNTER — Other Ambulatory Visit: Payer: Self-pay

## 2019-09-06 ENCOUNTER — Ambulatory Visit (INDEPENDENT_AMBULATORY_CARE_PROVIDER_SITE_OTHER): Payer: Medicare HMO

## 2019-09-06 VITALS — BP 140/82 | Ht 62.5 in | Wt 243.0 lb

## 2019-09-06 DIAGNOSIS — J45909 Unspecified asthma, uncomplicated: Secondary | ICD-10-CM

## 2019-09-06 DIAGNOSIS — Z1231 Encounter for screening mammogram for malignant neoplasm of breast: Secondary | ICD-10-CM

## 2019-09-06 DIAGNOSIS — Z0000 Encounter for general adult medical examination without abnormal findings: Secondary | ICD-10-CM

## 2019-09-06 NOTE — Progress Notes (Signed)
Subjective:   Patricia Fuentes is a 74 y.o. female who presents for Medicare Annual (Subsequent) preventive examination.  Review of Systems:   Cardiac Risk Factors include: advanced age (>41men, >66 women);hypertension;dyslipidemia;obesity (BMI >30kg/m2);sedentary lifestyle     Objective:     Vitals: BP 140/82   Ht 5' 2.5" (1.588 m)   Wt 243 lb (110.2 kg)   BMI 43.74 kg/m   Body mass index is 43.74 kg/m.  Advanced Directives 09/06/2019 09/03/2018 06/08/2018 06/08/2018 09/01/2017 07/23/2017 07/09/2017  Does Patient Have a Medical Advance Directive? No No No No No No No  Type of Advance Directive - - - - - - -  Does patient want to make changes to medical advance directive? - - - - - - -  Would patient like information on creating a medical advance directive? Yes (ED - Information included in AVS) - Yes (Inpatient - patient requests chaplain consult to create a medical advance directive) No - Patient declined Yes (MAU/Ambulatory/Procedural Areas - Information given) Yes (MAU/Ambulatory/Procedural Areas - Information given) -    Tobacco Social History   Tobacco Use  Smoking Status Never Smoker  Smokeless Tobacco Never Used     Counseling given: Not Answered   Clinical Intake:  Pre-visit preparation completed: No  Pain Score: 0-No pain     Nutritional Status: BMI > 30  Obese Diabetes: No  How often do you need to have someone help you when you read instructions, pamphlets, or other written materials from your doctor or pharmacy?: 1 - Never  Interpreter Needed?: No     Past Medical History:  Diagnosis Date  . Bronchitis   . Chronic back pain   . Essential hypertension   . Hyperlipidemia   . IGT (impaired glucose tolerance) 2015  . Obesity, unspecified   . Osteoarthritis   . Seasonal allergies    Past Surgical History:  Procedure Laterality Date  . CARDIAC CATHETERIZATION N/A 10/25/2014   Procedure: Left Heart Cath and Coronary Angiography;  Surgeon: Kathleene Hazel, MD;  Location: [facility] INVASIVE CV LAB;  Service: Cardiovascular;  Laterality: N/A;  . COLONOSCOPY N/A 02/09/2016   Procedure: COLONOSCOPY;  Surgeon: West Bali, MD;  Location: [facility] ENDO SUITE;  Service: Endoscopy;  Laterality: N/A;  12:30 PM  . KNEE ARTHROSCOPY Left   . TOTAL ABDOMINAL HYSTERECTOMY  1995 approx   Fibroids  . TOTAL KNEE ARTHROPLASTY Right 2011  . TOTAL KNEE ARTHROPLASTY Left 04/29/2017   Procedure: TOTAL KNEE ARTHROPLASTY;  Surgeon: Vickki Hearing, MD;  Location: [facility] ORS;  Service: Orthopedics;  Laterality: Left;   Family History  Problem Relation Age of Onset  . Hypertension Mother   . Stroke Mother 39       Deceased due CVa at 67  . Diabetes Father   . Cancer Father 34       Unknown, somewhere in stomach  . Hypertension Brother   . Hypertension Brother   . Hypertension Brother   . Cancer Maternal Grandfather        ?type  . Hypertension Son    Social History   Socioeconomic History  . Marital status: Married    Spouse name: Not on file  . Number of children: Not on file  . Years of education: Not on file  . Highest education level: Not on file  Occupational History  . Occupation: retired    Associate Professor: UNEMPLOYED  Tobacco Use  . Smoking status: Never Smoker  . Smokeless tobacco: Never Used  Substance and Sexual Activity  . Alcohol use: No  . Drug use: No  . Sexual activity: Yes    Birth control/protection: Surgical  Other Topics Concern  . Not on file  Social History Narrative  . Not on file   Social Determinants of Health   Financial Resource Strain:   . Difficulty of Paying Living Expenses:   Food Insecurity:   . Worried About Charity fundraiser in the Last Year:   . Arboriculturist in the Last Year:   Transportation Needs: No Transportation Needs  . Lack of Transportation (Medical): No  . Lack of Transportation (Non-Medical): No  Physical Activity: Inactive  . Days of Exercise per Week: 0 days  . Minutes of Exercise per  Session: 0 min  Stress:   . Feeling of Stress :   Social Connections: Slightly Isolated  . Frequency of Communication with Friends and Family: More than three times a week  . Frequency of Social Gatherings with Friends and Family: More than three times a week  . Attends Religious Services: More than 4 times per year  . Active Member of Clubs or Organizations: No  . Attends Archivist Meetings: Never  . Marital Status: Married    Outpatient Encounter Medications as of 09/06/2019  Medication Sig  . albuterol (PROVENTIL HFA;VENTOLIN HFA) 108 (90 Base) MCG/ACT inhaler Inhale 1-2 puffs into the lungs every 6 (six) hours as needed for wheezing or shortness of breath.  Marland Kitchen amLODipine (NORVASC) 10 MG tablet Take 1 tablet (10 mg total) by mouth daily.  Marland Kitchen aspirin EC 81 MG tablet Take 162 mg by mouth every morning.   . ergocalciferol (VITAMIN D2) 1.25 MG (50000 UT) capsule Take 1 capsule (50,000 Units total) by mouth once a week. One capsule once weekly  . fluticasone (FLONASE) 50 MCG/ACT nasal spray Place 1 spray into both nostrils as needed for allergies or rhinitis.  . OXYGEN Inhale 2 L into the lungs at bedtime as needed.   No facility-administered encounter medications on file as of 09/06/2019.    Activities of Daily Living In your present state of health, do you have any difficulty performing the following activities: 09/06/2019 09/06/2019  Hearing? N N  Vision? N N  Difficulty concentrating or making decisions? N N  Walking or climbing stairs? Y Y  Dressing or bathing? N N  Doing errands, shopping? N N  Preparing Food and eating ? N -  Using the Toilet? N -  In the past six months, have you accidently leaked urine? N -  Do you have problems with loss of bowel control? N -  Managing your Medications? N -  Managing your Finances? N -  Housekeeping or managing your Housekeeping? N -  Some recent data might be hidden    Patient Care Team: Fayrene Helper, MD as PCP -  General Herminio Commons, MD as PCP - Cardiology (Cardiology) Carole Civil, MD as Consulting Physician (Orthopedic Surgery) Danie Binder, MD as Consulting Physician (Gastroenterology)    Assessment:   This is a routine wellness examination for Leinani.  Exercise Activities and Dietary recommendations Current Exercise Habits: The patient does not participate in regular exercise at present, Exercise limited by: orthopedic condition(s)  Goals    . DIET - INCREASE WATER INTAKE     Drink 8 glasses of water     . Exercise 3x per week (30 min per time)     Recommend chair exercises at least  3 times a week for at least 30 minutes at a time as tolerated.    Marland Kitchen LIFESTYLE - DECREASE FALLS RISK       Fall Risk Fall Risk  09/06/2019 03/10/2019 10/22/2018 09/03/2018 09/03/2018  Falls in the past year? 0 0 1 0 0  Number falls in past yr: 0 0 1 0 0  Injury with Fall? 0 0 0 0 0  Risk for fall due to : - - - - -  Follow up - - - Falls evaluation completed;Falls prevention discussed;Education provided -  Comment - - - - -   Is the patient's home free of loose throw rugs in walkways, pet beds, electrical cords, etc?   yes      Grab bars in the bathroom? yes      Handrails on the stairs?   yes      Adequate lighting?   yes  Timed Get Up and Go performed: virtual visit   Depression Screen PHQ 2/9 Scores 09/06/2019 03/10/2019 10/22/2018 09/03/2018  PHQ - 2 Score 0 0 0 0     Cognitive Function     6CIT Screen 09/03/2018 09/01/2017 07/18/2016  What Year? 0 points 0 points 0 points  What month? 0 points 0 points 0 points  What time? 0 points 0 points 0 points  Count back from 20 0 points 0 points 0 points  Months in reverse 0 points 0 points 0 points  Repeat phrase 2 points 0 points 0 points  Total Score 2 0 0    Immunization History  Administered Date(s) Administered  . Fluad Quad(high Dose 65+) 03/10/2019  . Influenza, High Dose Seasonal PF 05/05/2018  . Influenza,inj,Quad PF,6+ Mos  06/22/2013, 07/18/2014, 07/04/2015, 03/21/2016, 02/11/2017  . PPD Test 01/15/2018  . Pneumococcal Conjugate-13 12/27/2013  . Pneumococcal Polysaccharide-23 12/15/2012    Qualifies for Shingles Vaccine? Check with pharmacy to get vaccine   Screening Tests Health Maintenance  Topic Date Due  . TETANUS/TDAP  Never done  . INFLUENZA VACCINE  12/26/2019  . MAMMOGRAM  10/13/2020  . COLONOSCOPY  02/08/2026  . DEXA SCAN  Completed  . Hepatitis C Screening  Completed  . PNA vac Low Risk Adult  Completed    Cancer Screenings: Lung: Low Dose CT Chest recommended if Age 34-80 years, 30 pack-year currently smoking OR have quit w/in 15years. Patient does not qualify. Breast:  Up to date on Mammogram? Yes   Up to date of Bone Density/Dexa? Yes Colorectal: not due until 2027  Additional Screenings:  Hepatitis C Screening: completed      Plan:     I have personally reviewed and noted the following in the patient's chart:   . Medical and social history . Use of alcohol, tobacco or illicit drugs  . Current medications and supplements . Functional ability and status . Nutritional status . Physical activity . Advanced directives . List of other physicians . Hospitalizations, surgeries, and ER visits in previous 12 months . Vitals . Screenings to include cognitive, depression, and falls . Referrals and appointments  In addition, I have reviewed and discussed with patient certain preventive protocols, quality metrics, and best practice recommendations. A written personalized care plan for preventive services as well as general preventive health recommendations were provided to patient.     Everitt Amber, LPN, LPN  [id number]/83/[id number]

## 2019-09-06 NOTE — Patient Instructions (Addendum)
Patricia Fuentes , Thank you for taking time to come for your Medicare Wellness Visit. I appreciate your ongoing commitment to your health goals. Please review the following plan we discussed and let me know if I can assist you in the future.   Screening recommendations/referrals: Colonoscopy: up to date  Mammogram: ordered- (need to schedule)  Bone Density: up to date  Recommended yearly ophthalmology/optometry visit for glaucoma screening and checkup Recommended yearly dental visit for hygiene and checkup  Vaccinations: Influenza vaccine: up to date  Pneumococcal vaccine: up to date  Tdap vaccine: due  Shingles vaccine: qualifies to receive at pharmacy   Advanced directives: mailed   Conditions/risks identified: fall risk   Next appointment: 1 year    Preventive Care 35 Years and Older, Female Preventive care refers to lifestyle choices and visits with your health care provider that can promote health and wellness. What does preventive care include?  A yearly physical exam. This is also called an annual well check.  Dental exams once or twice a year.  Routine eye exams. Ask your health care provider how often you should have your eyes checked.  Personal lifestyle choices, including:  Daily care of your teeth and gums.  Regular physical activity.  Eating a healthy diet.  Avoiding tobacco and drug use.  Limiting alcohol use.  Practicing safe sex.  Taking low-dose aspirin every day.  Taking vitamin and mineral supplements as recommended by your health care provider. What happens during an annual well check? The services and screenings done by your health care provider during your annual well check will depend on your age, overall health, lifestyle risk factors, and family history of disease. Counseling  Your health care provider may ask you questions about your:  Alcohol use.  Tobacco use.  Drug use.  Emotional well-being.  Home and relationship  well-being.  Sexual activity.  Eating habits.  History of falls.  Memory and ability to understand (cognition).  Work and work Astronomer.  Reproductive health. Screening  You may have the following tests or measurements:  Height, weight, and BMI.  Blood pressure.  Lipid and cholesterol levels. These may be checked every 5 years, or more frequently if you are over 82 years old.  Skin check.  Lung cancer screening. You may have this screening every year starting at age 103 if you have a 30-pack-year history of smoking and currently smoke or have quit within the past 15 years.  Fecal occult blood test (FOBT) of the stool. You may have this test every year starting at age 60.  Flexible sigmoidoscopy or colonoscopy. You may have a sigmoidoscopy every 5 years or a colonoscopy every 10 years starting at age 68.  Hepatitis C blood test.  Hepatitis B blood test.  Sexually transmitted disease (STD) testing.  Diabetes screening. This is done by checking your blood sugar (glucose) after you have not eaten for a while (fasting). You may have this done every 1-3 years.  Bone density scan. This is done to screen for osteoporosis. You may have this done starting at age 25.  Mammogram. This may be done every 1-2 years. Talk to your health care provider about how often you should have regular mammograms. Talk with your health care provider about your test results, treatment options, and if necessary, the need for more tests. Vaccines  Your health care provider may recommend certain vaccines, such as:  Influenza vaccine. This is recommended every year.  Tetanus, diphtheria, and acellular pertussis (Tdap, Td) vaccine. You  may need a Td booster every 10 years.  Zoster vaccine. You may need this after age 64.  Pneumococcal 13-valent conjugate (PCV13) vaccine. One dose is recommended after age 9.  Pneumococcal polysaccharide (PPSV23) vaccine. One dose is recommended after age  27. Talk to your health care provider about which screenings and vaccines you need and how often you need them. This information is not intended to replace advice given to you by your health care provider. Make sure you discuss any questions you have with your health care provider. Document Released: 06/09/2015 Document Revised: 01/31/2016 Document Reviewed: 03/14/2015 Elsevier Interactive Patient Education  2017 [place] Prevention in the Home Falls can cause injuries. They can happen to people of all ages. There are many things you can do to make your home safe and to help prevent falls. What can I do on the outside of my home?  Regularly fix the edges of walkways and driveways and fix any cracks.  Remove anything that might make you trip as you walk through a door, such as a raised step or threshold.  Trim any bushes or trees on the path to your home.  Use bright outdoor lighting.  Clear any walking paths of anything that might make someone trip, such as rocks or tools.  Regularly check to see if handrails are loose or broken. Make sure that both sides of any steps have handrails.  Any raised decks and porches should have guardrails on the edges.  Have any leaves, snow, or ice cleared regularly.  Use sand or salt on walking paths during winter.  Clean up any spills in your garage right away. This includes oil or grease spills. What can I do in the bathroom?  Use night lights.  Install grab bars by the toilet and in the tub and shower. Do not use towel bars as grab bars.  Use non-skid mats or decals in the tub or shower.  If you need to sit down in the shower, use a plastic, non-slip stool.  Keep the floor dry. Clean up any water that spills on the floor as soon as it happens.  Remove soap buildup in the tub or shower regularly.  Attach bath mats securely with double-sided non-slip rug tape.  Do not have throw rugs and other things on the floor that can make  you trip. What can I do in the bedroom?  Use night lights.  Make sure that you have a light by your bed that is easy to reach.  Do not use any sheets or blankets that are too big for your bed. They should not hang down onto the floor.  Have a firm chair that has side arms. You can use this for support while you get dressed.  Do not have throw rugs and other things on the floor that can make you trip. What can I do in the kitchen?  Clean up any spills right away.  Avoid walking on wet floors.  Keep items that you use a lot in easy-to-reach places.  If you need to reach something above you, use a strong step stool that has a grab bar.  Keep electrical cords out of the way.  Do not use floor polish or wax that makes floors slippery. If you must use wax, use non-skid floor wax.  Do not have throw rugs and other things on the floor that can make you trip. What can I do with my stairs?  Do not leave any items  on the stairs.  Make sure that there are handrails on both sides of the stairs and use them. Fix handrails that are broken or loose. Make sure that handrails are as long as the stairways.  Check any carpeting to make sure that it is firmly attached to the stairs. Fix any carpet that is loose or worn.  Avoid having throw rugs at the top or bottom of the stairs. If you do have throw rugs, attach them to the floor with carpet tape.  Make sure that you have a light switch at the top of the stairs and the bottom of the stairs. If you do not have them, ask someone to add them for you. What else can I do to help prevent falls?  Wear shoes that:  Do not have high heels.  Have rubber bottoms.  Are comfortable and fit you well.  Are closed at the toe. Do not wear sandals.  If you use a stepladder:  Make sure that it is fully opened. Do not climb a closed stepladder.  Make sure that both sides of the stepladder are locked into place.  Ask someone to hold it for you, if  possible.  Clearly mark and make sure that you can see:  Any grab bars or handrails.  First and last steps.  Where the edge of each step is.  Use tools that help you move around (mobility aids) if they are needed. These include:  Canes.  Walkers.  Scooters.  Crutches.  Turn on the lights when you go into a dark area. Replace any light bulbs as soon as they burn out.  Set up your furniture so you have a clear path. Avoid moving your furniture around.  If any of your floors are uneven, fix them.  If there are any pets around you, be aware of where they are.  Review your medicines with your doctor. Some medicines can make you feel dizzy. This can increase your chance of falling. Ask your doctor what other things that you can do to help prevent falls. This information is not intended to replace advice given to you by your health care provider. Make sure you discuss any questions you have with your health care provider. Document Released: 03/09/2009 Document Revised: 10/19/2015 Document Reviewed: 06/17/2014 Elsevier Interactive Patient Education  2017 [employer].

## 2019-09-07 DIAGNOSIS — Z96659 Presence of unspecified artificial knee joint: Secondary | ICD-10-CM | POA: Diagnosis not present

## 2019-09-07 DIAGNOSIS — G4733 Obstructive sleep apnea (adult) (pediatric): Secondary | ICD-10-CM | POA: Diagnosis not present

## 2019-09-07 DIAGNOSIS — E662 Morbid (severe) obesity with alveolar hypoventilation: Secondary | ICD-10-CM | POA: Diagnosis not present

## 2019-09-08 ENCOUNTER — Encounter: Payer: Medicare HMO | Admitting: Family Medicine

## 2019-09-10 DIAGNOSIS — Z4789 Encounter for other orthopedic aftercare: Secondary | ICD-10-CM | POA: Diagnosis not present

## 2019-09-10 DIAGNOSIS — Z96659 Presence of unspecified artificial knee joint: Secondary | ICD-10-CM | POA: Diagnosis not present

## 2019-09-10 DIAGNOSIS — E662 Morbid (severe) obesity with alveolar hypoventilation: Secondary | ICD-10-CM | POA: Diagnosis not present

## 2019-09-15 ENCOUNTER — Ambulatory Visit (HOSPITAL_COMMUNITY): Payer: Medicare HMO

## 2019-09-22 DIAGNOSIS — G4733 Obstructive sleep apnea (adult) (pediatric): Secondary | ICD-10-CM | POA: Diagnosis not present

## 2019-09-22 DIAGNOSIS — E662 Morbid (severe) obesity with alveolar hypoventilation: Secondary | ICD-10-CM | POA: Diagnosis not present

## 2019-09-30 ENCOUNTER — Institutional Professional Consult (permissible substitution): Payer: Medicare HMO | Admitting: Internal Medicine

## 2019-10-07 DIAGNOSIS — E662 Morbid (severe) obesity with alveolar hypoventilation: Secondary | ICD-10-CM | POA: Diagnosis not present

## 2019-10-07 DIAGNOSIS — Z96659 Presence of unspecified artificial knee joint: Secondary | ICD-10-CM | POA: Diagnosis not present

## 2019-10-07 DIAGNOSIS — G4733 Obstructive sleep apnea (adult) (pediatric): Secondary | ICD-10-CM | POA: Diagnosis not present

## 2019-10-10 DIAGNOSIS — E662 Morbid (severe) obesity with alveolar hypoventilation: Secondary | ICD-10-CM | POA: Diagnosis not present

## 2019-10-10 DIAGNOSIS — Z4789 Encounter for other orthopedic aftercare: Secondary | ICD-10-CM | POA: Diagnosis not present

## 2019-10-10 DIAGNOSIS — Z96659 Presence of unspecified artificial knee joint: Secondary | ICD-10-CM | POA: Diagnosis not present

## 2019-10-18 ENCOUNTER — Other Ambulatory Visit: Payer: Self-pay

## 2019-10-18 ENCOUNTER — Ambulatory Visit (HOSPITAL_COMMUNITY)
Admission: RE | Admit: 2019-10-18 | Discharge: 2019-10-18 | Disposition: A | Discharge status: Home / Self Care | Payer: Medicare HMO | Source: Ambulatory Visit | Attending: Family Medicine | Admitting: Family Medicine

## 2019-10-18 DIAGNOSIS — Z1231 Encounter for screening mammogram for malignant neoplasm of breast: Secondary | ICD-10-CM

## 2019-10-22 ENCOUNTER — Institutional Professional Consult (permissible substitution): Payer: Medicare HMO | Admitting: Internal Medicine

## 2019-11-03 ENCOUNTER — Institutional Professional Consult (permissible substitution): Payer: Medicare HMO | Admitting: Internal Medicine

## 2019-11-07 DIAGNOSIS — E662 Morbid (severe) obesity with alveolar hypoventilation: Secondary | ICD-10-CM | POA: Diagnosis not present

## 2019-11-07 DIAGNOSIS — Z96659 Presence of unspecified artificial knee joint: Secondary | ICD-10-CM | POA: Diagnosis not present

## 2019-11-07 DIAGNOSIS — G4733 Obstructive sleep apnea (adult) (pediatric): Secondary | ICD-10-CM | POA: Diagnosis not present

## 2019-11-10 DIAGNOSIS — E662 Morbid (severe) obesity with alveolar hypoventilation: Secondary | ICD-10-CM | POA: Diagnosis not present

## 2019-11-10 DIAGNOSIS — Z4789 Encounter for other orthopedic aftercare: Secondary | ICD-10-CM | POA: Diagnosis not present

## 2019-11-10 DIAGNOSIS — Z96659 Presence of unspecified artificial knee joint: Secondary | ICD-10-CM | POA: Diagnosis not present

## 2019-11-11 ENCOUNTER — Ambulatory Visit: Payer: Medicare HMO | Admitting: Internal Medicine

## 2019-11-11 ENCOUNTER — Encounter: Payer: Self-pay | Admitting: Internal Medicine

## 2019-11-11 ENCOUNTER — Other Ambulatory Visit: Payer: Self-pay

## 2019-11-11 DIAGNOSIS — E662 Morbid (severe) obesity with alveolar hypoventilation: Secondary | ICD-10-CM | POA: Diagnosis not present

## 2019-11-11 DIAGNOSIS — I10 Essential (primary) hypertension: Secondary | ICD-10-CM | POA: Diagnosis not present

## 2019-11-11 DIAGNOSIS — G4733 Obstructive sleep apnea (adult) (pediatric): Secondary | ICD-10-CM | POA: Diagnosis not present

## 2019-11-11 DIAGNOSIS — R0609 Other forms of dyspnea: Secondary | ICD-10-CM

## 2019-11-11 DIAGNOSIS — Z9989 Dependence on other enabling machines and devices: Secondary | ICD-10-CM

## 2019-11-11 DIAGNOSIS — R0600 Dyspnea, unspecified: Secondary | ICD-10-CM | POA: Diagnosis not present

## 2019-11-11 NOTE — Assessment & Plan Note (Signed)
Echo 07/09/2018 :  diastolic dysfunction / mild RVE/   LA=RA= mild dilation / small PF0 L > R  - PFT's  617/2020  FEV1 1.26 (76 % ) ratio 0.77  p 0 % improvement from saba p ? prior to study with FV curve minimal concavity and ERV 0.13 L   Classic restrictive pattern with low ERV c/w effects of obesity with no convincing evidence of asthma  I spent extra time with pt today reviewing appropriate use of albuterol for prn use on exertion with the following points: 1) saba is for relief of sob that does not improve by walking a slower pace or resting but rather if the pt does not improve after trying this first. 2) If the pt is convinced, as many are, that saba helps recover from activity faster then it's easy to tell if this is the case by re-challenging : ie stop, take the inhaler, then p 5 minutes try the exact same activity (intensity of workload) that just caused the symptoms and see if they are substantially diminished or not after saba 3) if there is an activity that reproducibly causes the symptoms, try the saba 15 min before the activity on alternate days   If in fact the saba really does help, then fine to continue to use it prn but advised may need to look closer at the maintenance regimen being used to achieve better control of airways disease with exertion.

## 2019-11-11 NOTE — Patient Instructions (Addendum)
All calls related to sleep and sleep equipment should go to Dr Dohmeier   Only use your albuterol as a rescue medication to be used if you can't catch your breath by resting or doing a relaxed purse lip breathing pattern.  - The less you use it, the better it will work when you need it. - Ok to use up to 2 puffs  every 4 hours if you must but call for immediate appointment if use goes up over your usual need - Don't leave home without it !!  (think of it like the spare tire for your car)   Try albuterol 15 min before an activity that you know would make you short of breath and see if it makes any difference and if makes none then don't take it after activity unless you can't catch your breath.  Avoid all salt and if the swelling doesn't improve see Dr Lodema Hong (? Due to amlopidine)   To get the most out of exercise, you need to be continuously aware that you are short of breath, but never out of breath, for 30 minutes daily. As you improve, it will actually be easier for you to do the same amount of exercise  in  30 minutes so always push to the level where you are short of breath.     Return if breathing worse or need more albuterol.

## 2019-11-11 NOTE — Assessment & Plan Note (Addendum)
Body mass index is 44.81 kg/m.  -  trending up still  Lab Results  Component Value Date   TSH 1.160 04/30/2018     Contributing to gerd risk/ doe/reviewed the need and the process to achieve and maintain neg calorie balance > defer f/u primary care including intermittently monitoring thyroid status    >>>> Note on the cpap she no longer has an elevated bicarb so not likely hypoventilating at this point

## 2019-11-11 NOTE — Assessment & Plan Note (Signed)
retainining fluid due to nacl excess and likely also due to high dose amlodipine but bp ok  >>> rec avoid salt/ salty food, elevate legs/ f/u with PCP re ? Diuretic vs change amlopidpine   Pulmonary f/u can be entirely prn at this point         Each maintenance medication was reviewed in detail including emphasizing most importantly the difference between maintenance and prns and under what circumstances the prns are to be triggered using an action plan format where appropriate.  Total time for H and P, chart review, counseling, teaching device (cpap compliance) and generating customized AVS unique to this new pt office visit / charting = 

## 2019-11-11 NOTE — Progress Notes (Signed)
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  Patient discharged home with instruction to follow-up with PCP in 10 days.  Arrangement has been made to provide oxygen supplementation at discharge.   History of present illness:  As per H&P written by Dr. Allena Katz on 06/08/2018 74 y.o.femalewithwith medical history significant forHTN, HLD, and OApresents the ED with about 1 week of cough productive of white sputum. She has had intermittent episodes of feeling "woozy" but denies any other associated symptoms including fevers, diaphoresis, dyspnea, chest pain, palpitations, abdominal pain, or dysuria. She has noted some swelling in her right leg. She does report snoring at night says that her husband has noticed that she has occasional episodes where she stops breathing while sleeping. She also reports daytime fatigue requiring naps. She was referred for a sleep study however rescheduled it. She denies any personal history of lung disease including asthma or COPD. She is a never smoker but says she has occasional exposure to secondhand smoke.  Hospital Course:  1-hypoxia: In the setting of obesity hypoventilation syndrome, obstructive sleep apnea and acute reactive airway from bronchitis. -Patient with good response to the use of oxygen supplementation (2 L nasal cannula supplementation), initiation of antibiotics and the use of albuterol nebulizer. -Patient advised on the importance for weight loss, outpatient sleep study evaluation with initiation on CPAP if required and also depending  clinical response evaluation with pulmonary function test. -Discharged on Zithromax to complete a total of 5 days, Flonase and also as needed albuterol inhaler.    2-morbid obesity: With obstructive sleep apnea -Outpatient evaluation as mentioned above for sleep study and CPAP initiation. -Low calorie diet, portion control and increase physical activity to assist with weight loss. -Patient will benefit of referral to bariatric clinic to further assist with weight loss plans. -Body mass index is 45.73 kg/m.       History of Present Illness  11/11/2019  Pulmonary/ 1st office eval/[name]  S/p  moderna vaccination for covid 19  Chief Complaint  Patient presents with  . Pulmonary Consult    Referred by Dr Syliva Overman. 74 Former patient of Dr Juanetta Gosling. She is using CPAP with 2lpm o2 with sleep. No o2 during the day. Breathing is overall doing well. She rarely uses albuterol.   Dyspnea:  MMRC1 = can walk nl pace, flat grade, can't hurry or go uphills or steps s legs give out first  Cough: none Sleep: cpap bed flat and 2 pillows / feels like rested most days  SABA use: maybe once a week at most when overdoes it New R > L leg swelling x weeks or a month or so, admits to eating lots of potato chips, no calf pain   No obvious day to day or daytime variability or assoc excess/ purulent sputum or mucus plugs or hemoptysis or cp or chest tightness, subjective wheeze or overt sinus or hb symptoms.   Sleeping as above without nocturnal  or early am exacerbation  of respiratory  c/o's or need for noct saba. Also denies any obvious fluctuation  of symptoms with weather or environmental changes or other aggravating or alleviating factors except as outlined above   No unusual exposure hx or h/o childhood pna/ asthma or knowledge of premature birth.  Current Allergies, Complete Past Medical History, Past Surgical History, Family History, and Social History were reviewed in [employer] record.  ROS  The following are not active complaints unless bolded Hoarseness, sore throat, dysphagia, dental problems, itching, sneezing,  nasal congestion or discharge of excess mucus or purulent secretions, ear ache,   fever, chills, sweats, unintended wt loss or wt gain, classically pleuritic or exertional cp,  orthopnea pnd or arm/hand swelling  or leg swelling, presyncope, palpitations, abdominal pain, anorexia, nausea, vomiting, diarrhea  or change in bowel habits or change in bladder habits, change in stools or change in urine, dysuria, hematuria,  rash, arthralgias, visual complaints, headache, numbness, weakness or ataxia or problems with walking or coordination,  change in mood or  memory.           Past Medical History:  Diagnosis Date  . Bronchitis   . Chronic back pain   . Essential hypertension   . Hyperlipidemia   . IGT (impaired glucose tolerance) 2015  . Obesity, unspecified   . Osteoarthritis   . Seasonal allergies     Outpatient Medications Prior to Visit  Medication Sig Dispense Refill  . albuterol (PROVENTIL HFA;VENTOLIN HFA) 108 (90 Base) MCG/ACT inhaler Inhale 1-2 puffs into the lungs every 6 (six) hours as needed for wheezing or shortness of breath. 1 Inhaler 2  . amLODipine (NORVASC) 10 MG tablet Take 1 tablet (10 mg total) by mouth daily. 90 tablet 3  . aspirin EC 81 MG tablet Take 162 mg by mouth every morning.     . ergocalciferol (VITAMIN D2) 1.25 MG (50000 UT) capsule Take 1 capsule (50,000 Units total) by mouth once a week. One capsule once weekly 12 capsule 1  . fluticasone (FLONASE) 50 MCG/ACT nasal spray Place 1 spray into both nostrils as needed for allergies or rhinitis.    . OXYGEN Inhale 2 L into the lungs at bedtime as needed.     No facility-administered medications prior to visit.     Objective:     BP 122/70 (BP Location: Left Arm, Cuff Size: Large)   Pulse 71   Temp 97.7 F (36.5 C) (Temporal)   Ht 5\' 2"  (1.575 m)   Wt 245  lb (111.1 kg)   SpO2 97% Comment: on RA  BMI 44.81 kg/m   SpO2: 97 % (on RA)  Wt Readings from Last 3 Encounters:  11/11/19 245 lb (111.1 kg)  09/06/19 243 lb (110.2 kg)  03/10/19 243 lb (110.2 kg)     Vital signs reviewed - Note on arrival 02 sats  97% on RA  Obese very pleasant amb bf nad   HEENT : pt wearing mask not removed for exam due to covid -19 concerns.    NECK :  without JVD/Nodes/TM/ nl carotid upstrokes bilaterally   LUNGS: no acc muscle use,  Nl contour chest which is clear to A and P bilaterally without cough on insp or exp maneuvers   CV:  RRR  no s3 or murmur or increase in P2, and  1+ Pitting R / trace on Left   ABD:  soft and nontender with nl inspiratory excursion in the supine position. No bruits or organomegaly appreciated, bowel sounds nl  MS:  Nl gait/ ext warm without deformities, calf tenderness, cyanosis  or clubbing No obvious joint restrictions   SKIN: warm and dry without lesions    NEURO:  alert, approp, nl sensorium with  no motor or cerebellar deficits apparent.          Assessment   DOE (dyspnea on exertion) Echo 07/09/2018 :  diastolic dysfunction / mild RVE/   LA=RA= mild dilation / small PF0 L > R  - PFT's  617/2020  FEV1 1.26 (76 % ) ratio 0.77  p 0 % improvement from saba p ? prior to study with FV curve minimal concavity and ERV 0.13 L   Classic restrictive pattern with low ERV c/w effects of obesity with no convincing evidence of asthma  I spent extra time with pt today reviewing appropriate use of albuterol for prn use on exertion with the following points: 1) saba is for relief of sob that does not improve by walking a slower pace or resting but rather if the pt does not improve after trying this first. 2) If the pt is convinced, as many are, that saba helps recover from activity faster then it's easy to tell if this is the case by re-challenging : ie stop, take the inhaler, then p 5 minutes try the exact same activity  (intensity of workload) that just caused the symptoms and see if they are substantially diminished or not after saba 3) if there is an activity that reproducibly causes the symptoms, try the saba 15 min before the activity on alternate days   If in fact the saba really does help, then fine to continue to use it prn but advised may need to look closer at the maintenance regimen being used to achieve better control of airways disease with exertion.      OSA on CPAP rx since 05/2018 by Dr Dohmeier  Etiology and pathophysiology of osa including relationship to obesity reviewed in detail   >>> Deferred all issues of sleep/ equipment to sleep medicine         Obesity with alveolar hypoventilation and body mass index (BMI) of 40 or greater (HCC) Body mass index is 44.81 kg/m.  -  trending up still  Lab Results  Component Value Date   TSH 1.160 04/30/2018     Contributing to gerd risk/ doe/reviewed the need and the process to achieve and maintain neg calorie balance > defer f/u primary care including intermittently monitoring thyroid status    >>>> Note on the cpap she no longer has an elevated bicarb so not likely hypoventilating at this point       Essential hypertension retainining fluid due to nacl excess and likely also due to high dose amlodipine but bp ok  >>> rec avoid salt/ salty food, elevate legs/ f/u with PCP re ? Diuretic vs change amlopidpine     Pulmonary f/u can be entirely prn at this point     Each maintenance medication was reviewed in detail including emphasizing most importantly the difference between maintenance and prns and under what circumstances the prns are to be triggered using an action plan format where appropriate.  Total time for H and P, chart review, counseling, teaching device (cpap compliance) and generating customized AVS unique to this new pt office visit / charting =         Sandrea Hughs, MD 11/11/2019

## 2019-11-11 NOTE — Assessment & Plan Note (Signed)
rx since 05/2018 by Dr Vickey Huger  >>> Deferred all issues of sleep/ equipment to sleep medicine   Etiology and pathophysiology of osa including relationship to obesity reviewed in detail

## 2019-12-06 ENCOUNTER — Other Ambulatory Visit: Payer: Self-pay | Admitting: Family Medicine

## 2019-12-07 DIAGNOSIS — E662 Morbid (severe) obesity with alveolar hypoventilation: Secondary | ICD-10-CM | POA: Diagnosis not present

## 2019-12-07 DIAGNOSIS — G4733 Obstructive sleep apnea (adult) (pediatric): Secondary | ICD-10-CM | POA: Diagnosis not present

## 2019-12-07 DIAGNOSIS — Z96659 Presence of unspecified artificial knee joint: Secondary | ICD-10-CM | POA: Diagnosis not present

## 2019-12-10 DIAGNOSIS — Z96659 Presence of unspecified artificial knee joint: Secondary | ICD-10-CM | POA: Diagnosis not present

## 2019-12-10 DIAGNOSIS — Z4789 Encounter for other orthopedic aftercare: Secondary | ICD-10-CM | POA: Diagnosis not present

## 2019-12-10 DIAGNOSIS — E662 Morbid (severe) obesity with alveolar hypoventilation: Secondary | ICD-10-CM | POA: Diagnosis not present

## 2020-01-07 DIAGNOSIS — G4733 Obstructive sleep apnea (adult) (pediatric): Secondary | ICD-10-CM | POA: Diagnosis not present

## 2020-01-07 DIAGNOSIS — Z96659 Presence of unspecified artificial knee joint: Secondary | ICD-10-CM | POA: Diagnosis not present

## 2020-01-07 DIAGNOSIS — E662 Morbid (severe) obesity with alveolar hypoventilation: Secondary | ICD-10-CM | POA: Diagnosis not present

## 2020-01-10 DIAGNOSIS — E662 Morbid (severe) obesity with alveolar hypoventilation: Secondary | ICD-10-CM | POA: Diagnosis not present

## 2020-01-10 DIAGNOSIS — Z4789 Encounter for other orthopedic aftercare: Secondary | ICD-10-CM | POA: Diagnosis not present

## 2020-01-10 DIAGNOSIS — Z96659 Presence of unspecified artificial knee joint: Secondary | ICD-10-CM | POA: Diagnosis not present

## 2020-01-19 ENCOUNTER — Ambulatory Visit (INDEPENDENT_AMBULATORY_CARE_PROVIDER_SITE_OTHER): Payer: Medicare HMO | Admitting: Family Medicine

## 2020-01-19 ENCOUNTER — Other Ambulatory Visit: Payer: Self-pay

## 2020-01-19 ENCOUNTER — Encounter: Payer: Self-pay | Admitting: Family Medicine

## 2020-01-19 VITALS — BP 123/78 | HR 74 | Resp 16 | Ht 62.0 in | Wt 249.0 lb

## 2020-01-19 DIAGNOSIS — E785 Hyperlipidemia, unspecified: Secondary | ICD-10-CM

## 2020-01-19 DIAGNOSIS — Z1211 Encounter for screening for malignant neoplasm of colon: Secondary | ICD-10-CM

## 2020-01-19 DIAGNOSIS — I10 Essential (primary) hypertension: Secondary | ICD-10-CM

## 2020-01-19 DIAGNOSIS — Z23 Encounter for immunization: Secondary | ICD-10-CM

## 2020-01-19 DIAGNOSIS — Z0000 Encounter for general adult medical examination without abnormal findings: Secondary | ICD-10-CM | POA: Diagnosis not present

## 2020-01-19 DIAGNOSIS — E559 Vitamin D deficiency, unspecified: Secondary | ICD-10-CM

## 2020-01-19 DIAGNOSIS — Z9989 Dependence on other enabling machines and devices: Secondary | ICD-10-CM

## 2020-01-19 DIAGNOSIS — H547 Unspecified visual loss: Secondary | ICD-10-CM

## 2020-01-19 DIAGNOSIS — J45909 Unspecified asthma, uncomplicated: Secondary | ICD-10-CM

## 2020-01-19 DIAGNOSIS — G4733 Obstructive sleep apnea (adult) (pediatric): Secondary | ICD-10-CM

## 2020-01-19 MED ORDER — CALCIUM CARBONATE-VITAMIN D 500-400 MG-UNIT PO TABS: 1.0000 | ORAL_TABLET | Freq: Two times a day (BID) | ORAL | 5 refills | Status: DC

## 2020-01-19 MED ORDER — UNABLE TO FIND: 0 refills | Status: DC

## 2020-01-19 NOTE — Patient Instructions (Signed)
F/u in office with mD in 6 months, call if you need me sooner  Flu vaccine today ' Cologuard to be arranged by Nurse at checkout  You are referred for eye exam due to poor vision  Please get fasting chem 7 and EGFr, tSH and vit D in mid Feb, 2021  Start one daily multivitamin you are mildly anemic  Take calcium with Vit D one twice daily for bone health  Please wotrk on increasing fruit and vegetable intake and reducing sweets, carbs and processed foods, also smaller portion sizes to help with weight loss  Nurse will look into availability of small portable oxygen tank for you to use if / when you travel o  It is important that you exercise regularly at least 30 minutes 5 times a week. If you develop chest pain, have severe difficulty breathing, or feel very tired, stop exercising immediately and seek medical attention  Thanks for choosing [facility], we consider it a privelige to serve you. ut of town on vacation and let you know  if covered     

## 2020-01-21 ENCOUNTER — Encounter: Payer: Self-pay | Admitting: Family Medicine

## 2020-01-21 NOTE — Assessment & Plan Note (Signed)

## 2020-01-21 NOTE — Progress Notes (Signed)
    Patricia Fuentes     MRN: [id number]      DOB: March 29, 1946  HPI: Patient is in for annual physical exam. No other health concerns are expressed or addressed at the visit. Recent labs, if available are reviewed. Immunization is reviewed , and  updated if needed.   PE: BP 123/78   Pulse 74   Resp 16   Ht 5\' 2"  (1.575 m)   Wt 249 lb 0.6 oz (113 kg)   SpO2 96%   BMI 45.55 kg/m   Pleasant  female, alert and oriented x 3, in no cardio-pulmonary distress. Afebrile. HEENT No facial trauma or asymetry. Sinuses non tender.  Extra occullar muscles intact.. External ears normal, . Neck: supple, no adenopathy,JVD or thyromegaly.No bruits.  Chest: Clear to ascultation bilaterally.No crackles or wheezes. Non tender to palpation    Cardiovascular system; Heart sounds normal,  S1 and  S2 ,no S3.  No murmur, or thrill. Apical beat not displaced Peripheral pulses normal.  Abdomen: Soft, non tender No guarding, tenderness or rebound.      Musculoskeletal exam: Decreased  ROM of spine, hips , shoulders and knees.  deformity ,swelling and  crepitus noted. No muscle wasting or atrophy.   Neurologic: Cranial nerves 2 to 12 intact. Power, tone ,sensation and reflexes normal throughout.  disturbance in gait. No tremor.  Skin: Intact, no ulceration, erythema , scaling or rash noted. Pigmentation normal throughout  Psych; Normal mood and affect. Judgement and concentration normal   Assessment & Plan:  Annual physical exam Annual exam as documented. Counseling done  re healthy lifestyle involving commitment to 150 minutes exercise per week, heart healthy diet, and attaining healthy weight.The importance of adequate sleep also discussed. Regular seat belt use and home safety, is also discussed. Changes in health habits are decided on by the patient with goals and time frames  set for achieving them. Immunization and cancer screening needs are specifically addressed at this  visit.

## 2020-01-24 ENCOUNTER — Other Ambulatory Visit: Payer: Self-pay | Admitting: Family Medicine

## 2020-01-24 ENCOUNTER — Telehealth: Payer: Self-pay

## 2020-01-24 MED ORDER — PREDNISONE 5 MG PO TABS: 5.0000 mg | ORAL_TABLET | Freq: Two times a day (BID) | ORAL | 0 refills | Status: AC

## 2020-01-24 NOTE — Telephone Encounter (Signed)
Pt lvm wants a call back

## 2020-01-24 NOTE — Telephone Encounter (Signed)
Sounds like an irritated/ allergic cough, 5 days of prednisone is prescribed

## 2020-01-24 NOTE — Telephone Encounter (Signed)
Patient called and said that she was here on 8/25 and the day before she used bleach to clean her tub and it made her cough and she said she mentioned it to you at her visit. She still has a  cough and wants to know what to do. Small amount of white phlegm. Please advise

## 2020-01-25 NOTE — Telephone Encounter (Signed)
Called pt and left voicemail that med had been sent in and to call with any concerns

## 2020-01-26 ENCOUNTER — Telehealth: Payer: Self-pay

## 2020-01-26 NOTE — Telephone Encounter (Signed)
Pt called to check on status of portable oxygen request so she could take it on vacation. I called [place] and had to leave a message for the o2 dept to call me back about this

## 2020-01-27 NOTE — Telephone Encounter (Signed)
Spoke to someone at [employer] and they will call and explain to patient that she already has portable tanks that she can take with her if she goes out

## 2020-02-10 DIAGNOSIS — Z96659 Presence of unspecified artificial knee joint: Secondary | ICD-10-CM | POA: Diagnosis not present

## 2020-02-10 DIAGNOSIS — Z4789 Encounter for other orthopedic aftercare: Secondary | ICD-10-CM | POA: Diagnosis not present

## 2020-02-10 DIAGNOSIS — E662 Morbid (severe) obesity with alveolar hypoventilation: Secondary | ICD-10-CM | POA: Diagnosis not present

## 2020-02-26 IMAGING — MG DIGITAL SCREENING BILATERAL MAMMOGRAM WITH TOMO AND CAD
6 of 12 series · 6 of 36 positions shown · non-contrast
Comparison: Previous exam(s).

CLINICAL DATA: Screening.

EXAM:
DIGITAL SCREENING BILATERAL MAMMOGRAM WITH TOMO AND CAD

[L MLO synth-2D (1 of 2)]
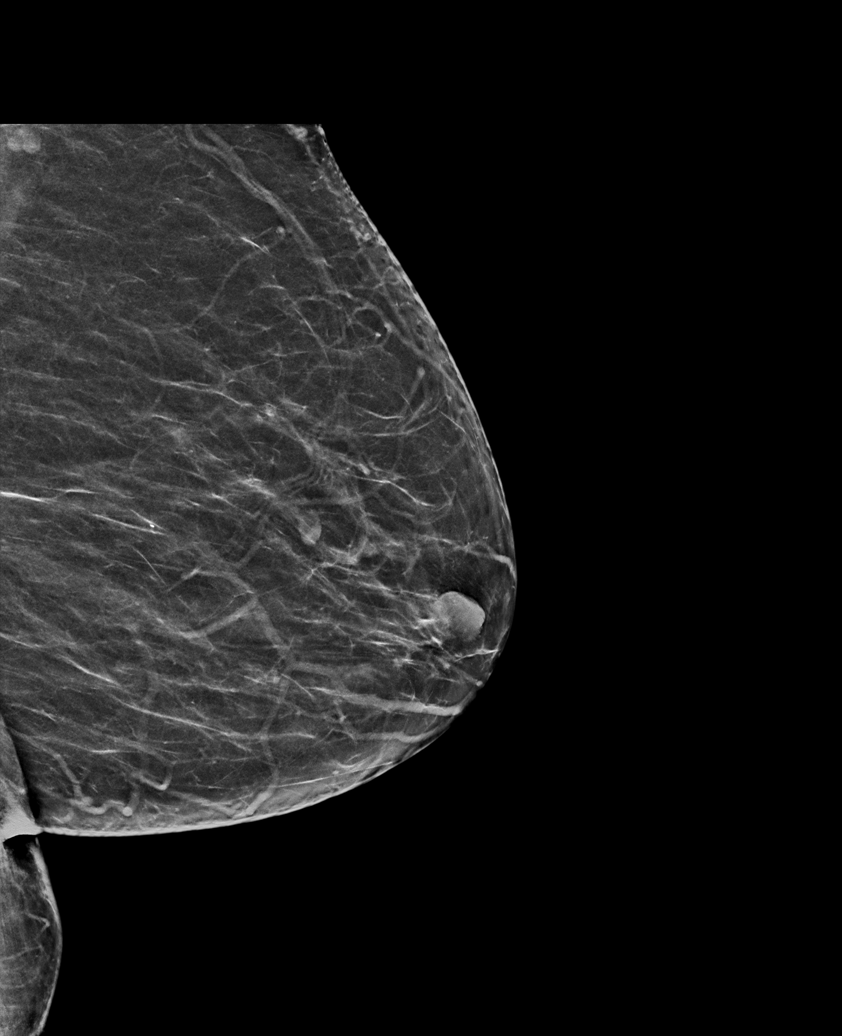

[L MLO synth-2D (2 of 2)]
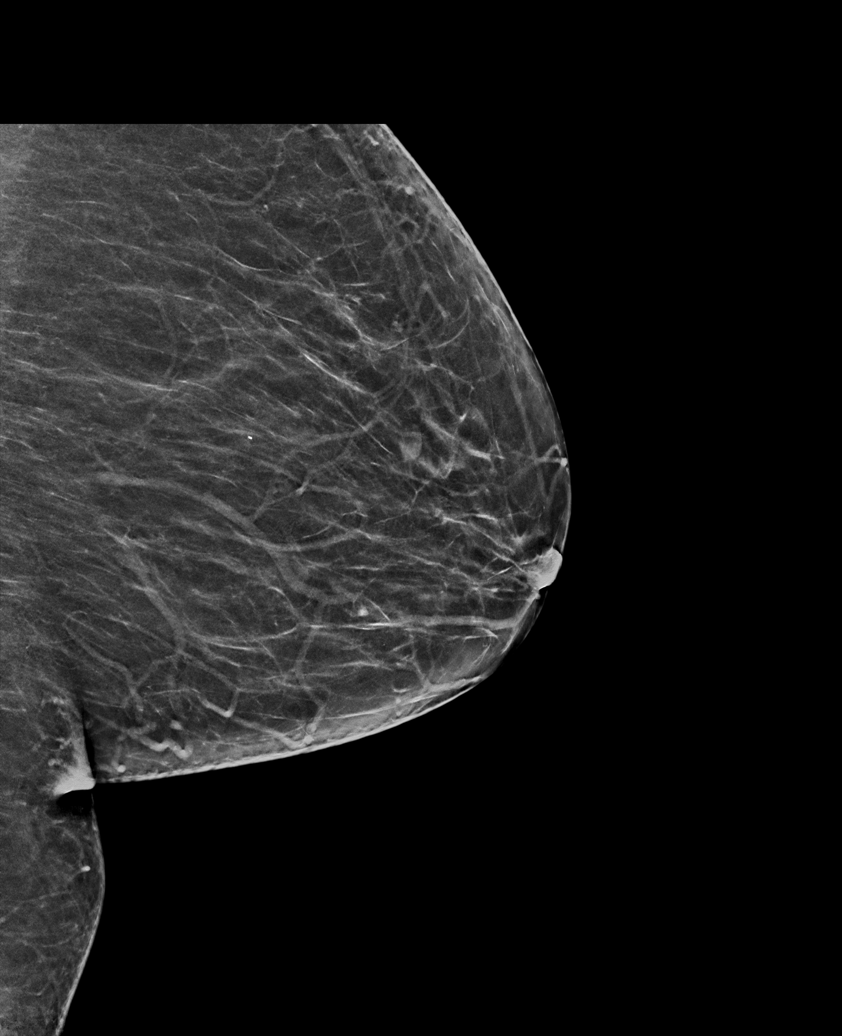

[R CC synth-2D]
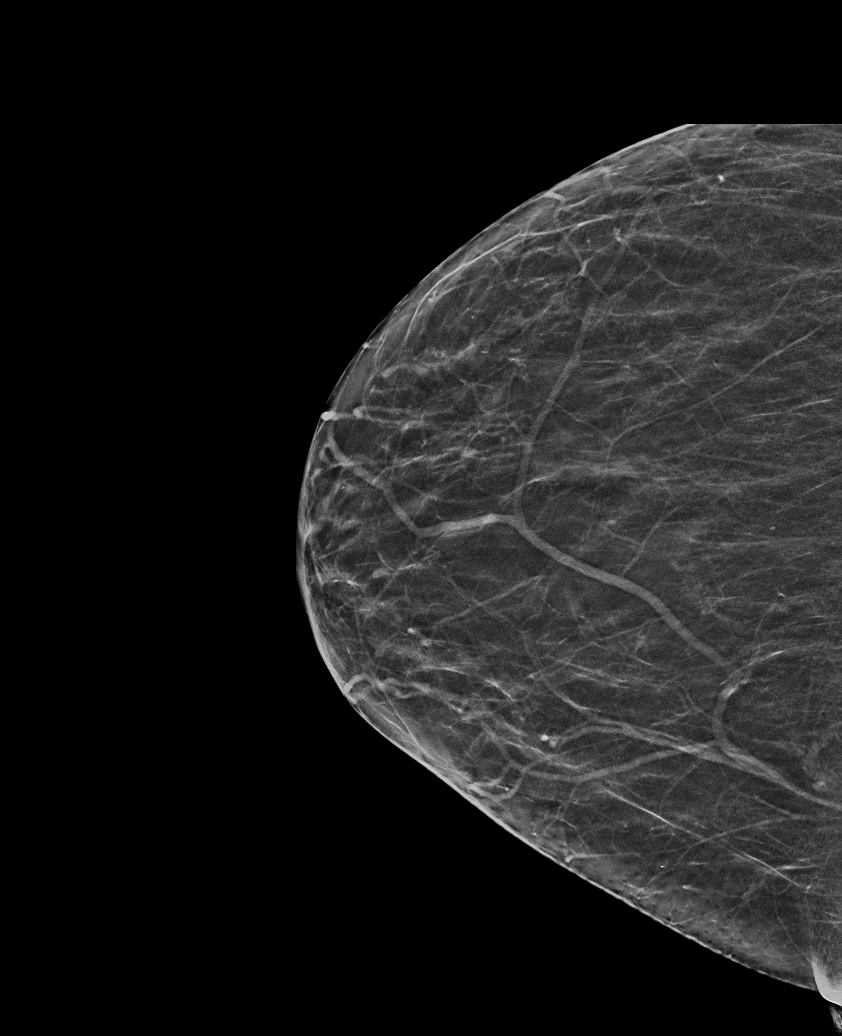

[R MLO synth-2D (1 of 2)]
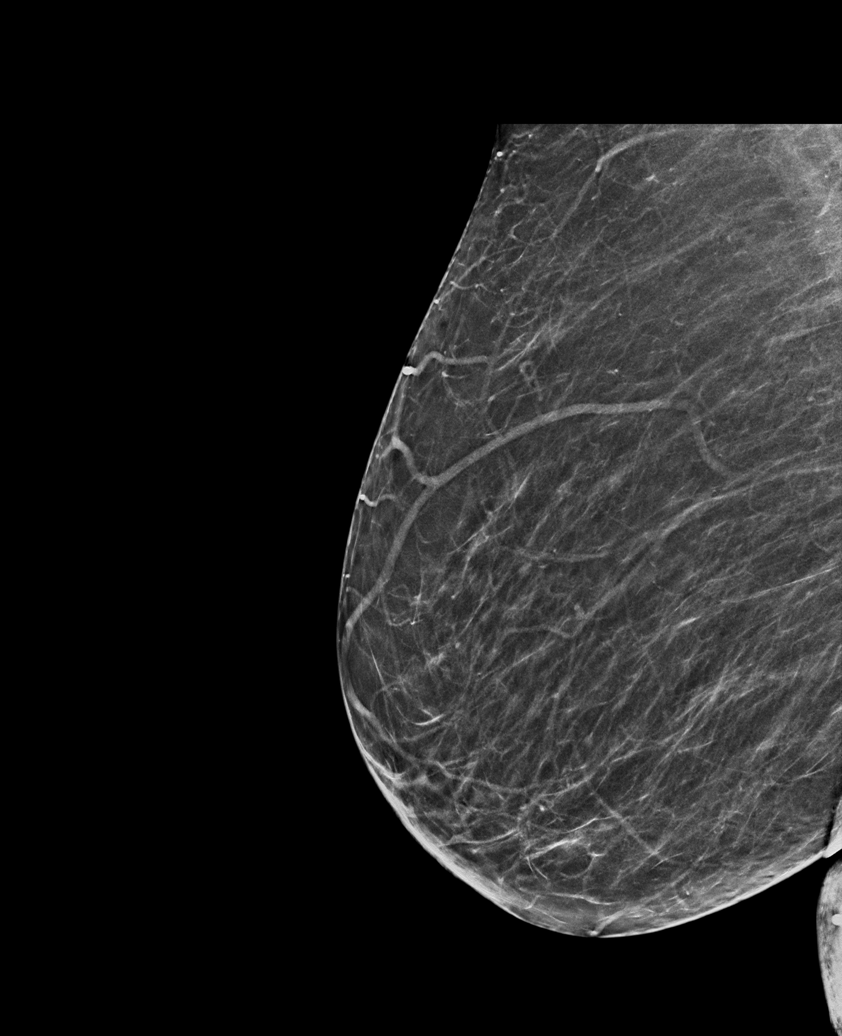

[R MLO synth-2D (2 of 2)]
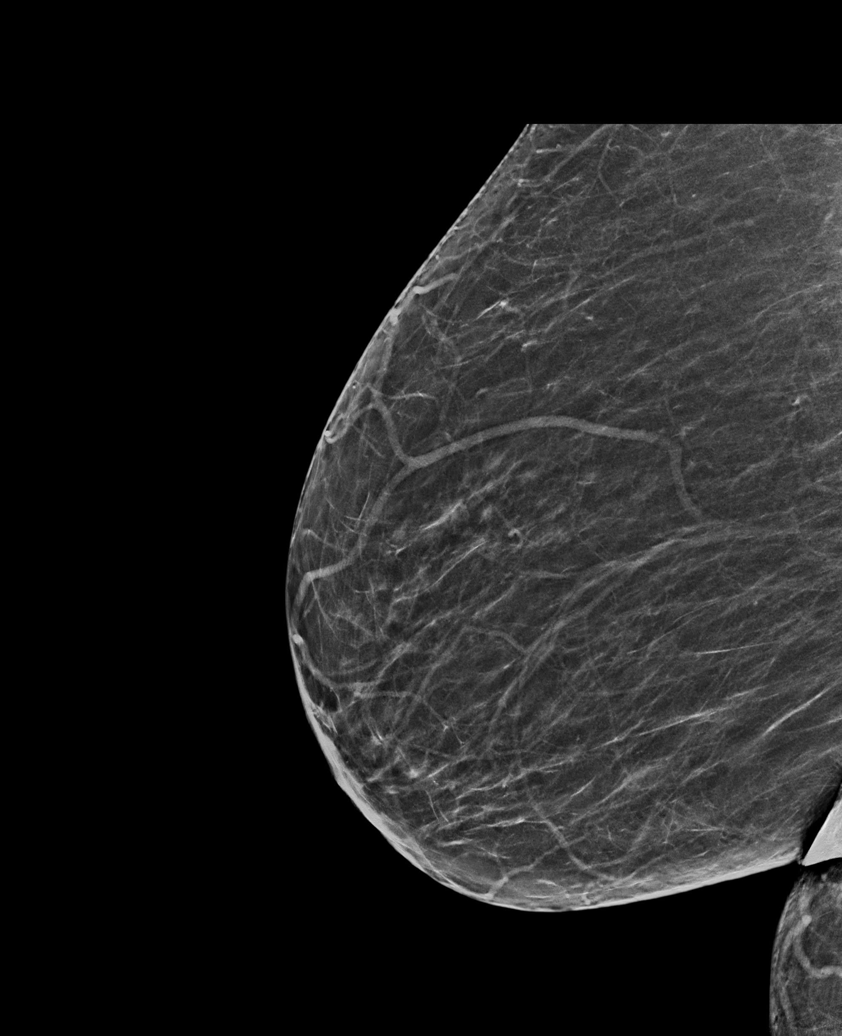

[L CC synth-2D]
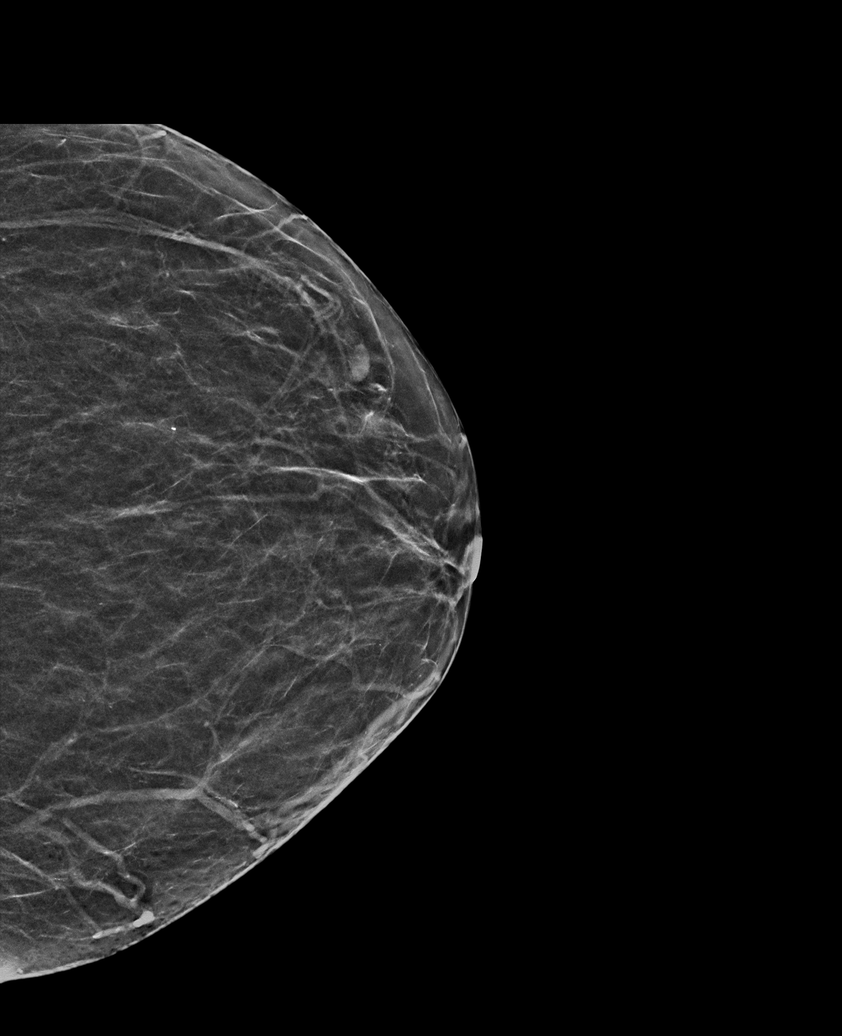

[6 of 36 positions shown; findings below may reference images not displayed]

ACR Breast Density Category b: There are scattered areas of
fibroglandular density.
FINDINGS: There are no findings suspicious for malignancy. Images were
processed with CAD.
IMPRESSION: No mammographic evidence of malignancy. A result letter of this
screening mammogram will be mailed directly to the patient.

RECOMMENDATION:
Screening mammogram in one year. (Code:[id number])

BI-RADS CATEGORY  1: Negative.

## 2020-03-11 DIAGNOSIS — E662 Morbid (severe) obesity with alveolar hypoventilation: Secondary | ICD-10-CM | POA: Diagnosis not present

## 2020-03-11 DIAGNOSIS — Z4789 Encounter for other orthopedic aftercare: Secondary | ICD-10-CM | POA: Diagnosis not present

## 2020-03-11 DIAGNOSIS — Z96659 Presence of unspecified artificial knee joint: Secondary | ICD-10-CM | POA: Diagnosis not present

## 2020-04-11 DIAGNOSIS — Z96659 Presence of unspecified artificial knee joint: Secondary | ICD-10-CM | POA: Diagnosis not present

## 2020-04-11 DIAGNOSIS — E662 Morbid (severe) obesity with alveolar hypoventilation: Secondary | ICD-10-CM | POA: Diagnosis not present

## 2020-04-11 DIAGNOSIS — Z4789 Encounter for other orthopedic aftercare: Secondary | ICD-10-CM | POA: Diagnosis not present

## 2020-05-11 DIAGNOSIS — E662 Morbid (severe) obesity with alveolar hypoventilation: Secondary | ICD-10-CM | POA: Diagnosis not present

## 2020-05-11 DIAGNOSIS — Z96659 Presence of unspecified artificial knee joint: Secondary | ICD-10-CM | POA: Diagnosis not present

## 2020-05-11 DIAGNOSIS — Z4789 Encounter for other orthopedic aftercare: Secondary | ICD-10-CM | POA: Diagnosis not present

## 2020-05-22 DIAGNOSIS — J45909 Unspecified asthma, uncomplicated: Secondary | ICD-10-CM | POA: Diagnosis not present

## 2020-05-22 DIAGNOSIS — G4733 Obstructive sleep apnea (adult) (pediatric): Secondary | ICD-10-CM | POA: Diagnosis not present

## 2020-05-22 DIAGNOSIS — E662 Morbid (severe) obesity with alveolar hypoventilation: Secondary | ICD-10-CM | POA: Diagnosis not present

## 2020-05-23 ENCOUNTER — Other Ambulatory Visit: Payer: Self-pay

## 2020-05-23 ENCOUNTER — Encounter: Payer: Self-pay | Admitting: Nurse Practitioner

## 2020-05-23 ENCOUNTER — Telehealth: Payer: Self-pay

## 2020-05-23 ENCOUNTER — Other Ambulatory Visit: Payer: Self-pay | Admitting: Nurse Practitioner

## 2020-05-23 ENCOUNTER — Telehealth (INDEPENDENT_AMBULATORY_CARE_PROVIDER_SITE_OTHER): Payer: Medicare HMO | Admitting: Nurse Practitioner

## 2020-05-23 DIAGNOSIS — J40 Bronchitis, not specified as acute or chronic: Secondary | ICD-10-CM

## 2020-05-23 MED ORDER — BENZONATATE 100 MG PO CAPS: 100.0000 mg | ORAL_CAPSULE | Freq: Two times a day (BID) | ORAL | 0 refills | Status: DC | PRN

## 2020-05-23 MED ORDER — AZITHROMYCIN 250 MG PO TABS: ORAL_TABLET | ORAL | 0 refills | Status: DC

## 2020-05-23 MED ORDER — GUAIFENESIN-DM 100-10 MG/5ML PO SYRP: 15.0000 mL | ORAL_SOLUTION | Freq: Four times a day (QID) | ORAL | 0 refills | Status: DC | PRN

## 2020-05-23 MED ORDER — ALBUTEROL SULFATE HFA 108 (90 BASE) MCG/ACT IN AERS: 1.0000 | INHALATION_SPRAY | Freq: Four times a day (QID) | RESPIRATORY_TRACT | 2 refills | Status: DC | PRN

## 2020-05-23 NOTE — Telephone Encounter (Signed)
States she is unable to swallow capsules and cannot take the tessalon perles. Wants to know if you can call in something similar that's either a tablet or a liquid.

## 2020-05-23 NOTE — Progress Notes (Signed)
Acute Office Visit  Subjective:    Patient ID: Patricia Fuentes, female    DOB: 02-09-1946, 74 y.o.   MRN: [id number]  Chief Complaint  Patient presents with  . Cough    X 2 weeks     HPI Patient is in today for productive cough that started 2 weeks ago. She has some nasal congestion. Denies fever, SOB, sinus pain. She would like refill on her albuterol.  Past Medical History:  Diagnosis Date  . Bronchitis   . Chronic back pain   . Essential hypertension   . Hyperlipidemia   . IGT (impaired glucose tolerance) 2015  . Obesity, unspecified   . Osteoarthritis   . Seasonal allergies     Past Surgical History:  Procedure Laterality Date  . CARDIAC CATHETERIZATION N/A 10/25/2014   Procedure: Left Heart Cath and Coronary Angiography;  Surgeon: Kathleene Hazel, MD;  Location: [facility] INVASIVE CV LAB;  Service: Cardiovascular;  Laterality: N/A;  . COLONOSCOPY N/A 02/09/2016   Procedure: COLONOSCOPY;  Surgeon: West Bali, MD;  Location: [facility] ENDO SUITE;  Service: Endoscopy;  Laterality: N/A;  12:30 PM  . KNEE ARTHROSCOPY Left   . TOTAL ABDOMINAL HYSTERECTOMY  1995 approx   Fibroids  . TOTAL KNEE ARTHROPLASTY Right 2011  . TOTAL KNEE ARTHROPLASTY Left 04/29/2017   Procedure: TOTAL KNEE ARTHROPLASTY;  Surgeon: Vickki Hearing, MD;  Location: [facility] ORS;  Service: Orthopedics;  Laterality: Left;    Family History  Problem Relation Age of Onset  . Hypertension Mother   . Stroke Mother 37       Deceased due CVa at 75  . Diabetes Father   . Cancer Father 87       Unknown, somewhere in stomach  . Hypertension Brother   . Hypertension Brother   . Hypertension Brother   . Cancer Maternal Grandfather        ?type  . Hypertension Son     Social History   Socioeconomic History  . Marital status: Married    Spouse name: Not on file  . Number of children: Not on file  . Years of education: Not on file  . Highest education level: Not on file  Occupational History  .  Occupation: retired    Associate Professor: UNEMPLOYED  Tobacco Use  . Smoking status: Never Smoker  . Smokeless tobacco: Never Used  Vaping Use  . Vaping Use: Never used  Substance and Sexual Activity  . Alcohol use: No  . Drug use: No  . Sexual activity: Yes    Birth control/protection: Surgical  Other Topics Concern  . Not on file  Social History Narrative  . Not on file   Social Determinants of Health   Financial Resource Strain: Not on file  Food Insecurity: Not on file  Transportation Needs: No Transportation Needs  . Lack of Transportation (Medical): No  . Lack of Transportation (Non-Medical): No  Physical Activity: Inactive  . Days of Exercise per Week: 0 days  . Minutes of Exercise per Session: 0 min  Stress: Not on file  Social Connections: Moderately Integrated  . Frequency of Communication with Friends and Family: More than three times a week  . Frequency of Social Gatherings with Friends and Family: More than three times a week  . Attends Religious Services: More than 4 times per year  . Active Member of Clubs or Organizations: No  . Attends Banker Meetings: Never  . Marital Status: Married  Catering manager Violence:  Not on file    Outpatient Medications Prior to Visit  Medication Sig Dispense Refill  . amLODipine (NORVASC) 10 MG tablet TAKE 1 TABLET BY MOUTH ONCE A DAY. 90 tablet 0  . aspirin EC 81 MG tablet Take 162 mg by mouth every morning.     . calcium-vitamin D (OSCAL-500) 500-400 MG-UNIT tablet Take 1 tablet by mouth 2 (two) times daily. 60 tablet 5  . ergocalciferol (VITAMIN D2) 1.25 MG (50000 UT) capsule Take 1 capsule (50,000 Units total) by mouth once a week. One capsule once weekly 12 capsule 1  . OXYGEN Inhale 2 L into the lungs at bedtime as needed.    Marland Kitchen UNABLE TO FIND Oxygen conserving device DX J45.909. 1 each 0  . albuterol (PROVENTIL HFA;VENTOLIN HFA) 108 (90 Base) MCG/ACT inhaler Inhale 1-2 puffs into the lungs every 6 (six) hours as  needed for wheezing or shortness of breath. 1 Inhaler 2   No facility-administered medications prior to visit.    Allergies  Allergen Reactions  . Promethazine Hcl Nausea And Vomiting and Other (See Comments)    dizzy    Review of Systems  Constitutional: Negative for chills, fatigue and fever.  HENT: Positive for congestion. Negative for ear pain, sinus pressure, sinus pain and sore throat.   Respiratory: Positive for cough and chest tightness. Negative for shortness of breath and wheezing.        Objective:    Physical Exam  Wt 245 lb (111.1 kg)   BMI 44.81 kg/m  Wt Readings from Last 3 Encounters:  05/23/20 245 lb (111.1 kg)  01/19/20 249 lb 0.6 oz (113 kg)  11/11/19 245 lb (111.1 kg)    Health Maintenance Due  Topic Date Due  . TETANUS/TDAP  Never done  . COVID-19 Vaccine (3 - Booster for Moderna series) 01/16/2020    There are no preventive care reminders to display for this patient.   Lab Results  Component Value Date   TSH 1.160 04/30/2018   Lab Results  Component Value Date   WBC 6.0 07/08/2019   HGB 11.9 (L) 07/08/2019   HCT 38.7 07/08/2019   MCV 91.5 07/08/2019   PLT 176 07/08/2019   Lab Results  Component Value Date   NA 138 07/08/2019   K 3.6 07/08/2019   CO2 29 07/08/2019   GLUCOSE 124 (H) 07/08/2019   BUN 14 07/08/2019   CREATININE 0.60 07/08/2019   BILITOT 0.5 07/08/2019   ALKPHOS 61 07/08/2019   AST 15 07/08/2019   ALT 12 07/08/2019   PROT 7.7 07/08/2019   ALBUMIN 3.6 07/08/2019   CALCIUM 8.5 (L) 07/08/2019   ANIONGAP 11 07/08/2019   Lab Results  Component Value Date   CHOL 177 07/08/2019   Lab Results  Component Value Date   HDL 57 07/08/2019   Lab Results  Component Value Date   LDLCALC 106 (H) 07/08/2019   Lab Results  Component Value Date   TRIG 70 07/08/2019   Lab Results  Component Value Date   CHOLHDL 3.1 07/08/2019   Lab Results  Component Value Date   HGBA1C 5.7 (H) 11/10/2018       Assessment &  Plan:   Problem List Items Addressed This Visit      Respiratory   Bronchitis    -had productive cough x 2 weeks; no oxygenation issues -she would like COVID testing, so we will set her up for this -Rx. Azithromycin -Rx. Tessalon -refill albuterol  Meds ordered this encounter  Medications  . benzonatate (TESSALON) 100 MG capsule    Sig: Take 1 capsule (100 mg total) by mouth 2 (two) times daily as needed for cough.    Dispense:  20 capsule    Refill:  0  . azithromycin (ZITHROMAX) 250 MG tablet    Sig: Please dispense as a z-pack    Dispense:  6 tablet    Refill:  0  . albuterol (VENTOLIN HFA) 108 (90 Base) MCG/ACT inhaler    Sig: Inhale 1-2 puffs into the lungs every 6 (six) hours as needed for wheezing or shortness of breath.    Dispense:  1 each    Refill:  2   Date:  05/23/2020   Location of Patient: Home Location of Provider: Office Consent was obtain for visit to be over via telehealth. I verified that I am speaking with the correct person using two identifiers.  I connected with  Patricia Fuentes on 05/23/20 via telephone and verified that I am speaking with the correct person using two identifiers.   I discussed the limitations of evaluation and management by telemedicine. The patient expressed understanding and agreed to proceed.  Time spent: 8 min   Heather Roberts, NP

## 2020-05-23 NOTE — Assessment & Plan Note (Signed)
-  had productive cough x 2 weeks; no oxygenation issues -she would like COVID testing, so we will set her up for this -Rx. Azithromycin -Rx. Tessalon -refill albuterol

## 2020-05-23 NOTE — Telephone Encounter (Signed)
I sent in guaifenesin-dextromethorphan cough syrup for her.

## 2020-05-30 ENCOUNTER — Other Ambulatory Visit: Payer: Self-pay

## 2020-05-30 ENCOUNTER — Other Ambulatory Visit: Payer: Medicare HMO

## 2020-05-30 DIAGNOSIS — Z20822 Contact with and (suspected) exposure to covid-19: Secondary | ICD-10-CM

## 2020-06-01 LAB — NOVEL CORONAVIRUS, NAA: SARS-CoV-2, NAA: NOT DETECTED

## 2020-06-01 LAB — SARS-COV-2, NAA 2 DAY TAT

## 2020-06-01 LAB — SPECIMEN STATUS REPORT

## 2020-06-10 ENCOUNTER — Other Ambulatory Visit: Payer: Self-pay | Admitting: Family Medicine

## 2020-06-11 DIAGNOSIS — Z4789 Encounter for other orthopedic aftercare: Secondary | ICD-10-CM | POA: Diagnosis not present

## 2020-06-11 DIAGNOSIS — Z96659 Presence of unspecified artificial knee joint: Secondary | ICD-10-CM | POA: Diagnosis not present

## 2020-06-11 DIAGNOSIS — E662 Morbid (severe) obesity with alveolar hypoventilation: Secondary | ICD-10-CM | POA: Diagnosis not present

## 2020-07-06 ENCOUNTER — Other Ambulatory Visit: Payer: Self-pay

## 2020-07-06 DIAGNOSIS — E559 Vitamin D deficiency, unspecified: Secondary | ICD-10-CM

## 2020-07-06 DIAGNOSIS — R350 Frequency of micturition: Secondary | ICD-10-CM

## 2020-07-06 DIAGNOSIS — I10 Essential (primary) hypertension: Secondary | ICD-10-CM

## 2020-07-06 DIAGNOSIS — R5383 Other fatigue: Secondary | ICD-10-CM

## 2020-07-06 DIAGNOSIS — R7303 Prediabetes: Secondary | ICD-10-CM

## 2020-07-06 DIAGNOSIS — R7301 Impaired fasting glucose: Secondary | ICD-10-CM

## 2020-07-07 DIAGNOSIS — R7303 Prediabetes: Secondary | ICD-10-CM | POA: Diagnosis not present

## 2020-07-07 DIAGNOSIS — E559 Vitamin D deficiency, unspecified: Secondary | ICD-10-CM | POA: Diagnosis not present

## 2020-07-07 DIAGNOSIS — R5383 Other fatigue: Secondary | ICD-10-CM | POA: Diagnosis not present

## 2020-07-07 DIAGNOSIS — R7301 Impaired fasting glucose: Secondary | ICD-10-CM | POA: Diagnosis not present

## 2020-07-07 DIAGNOSIS — I10 Essential (primary) hypertension: Secondary | ICD-10-CM | POA: Diagnosis not present

## 2020-07-08 LAB — BASIC METABOLIC PANEL
BUN/Creatinine Ratio: 14 (ref 12–28)
BUN: 10 mg/dL (ref 8–27)
CO2: 28 mmol/L (ref 20–29)
Calcium: 9.1 mg/dL (ref 8.7–10.3)
Chloride: 98 mmol/L (ref 96–106)
Creatinine, Ser: 0.71 mg/dL (ref 0.57–1.00)
GFR calc Af Amer: 98 mL/min/{1.73_m2} (ref >59)
GFR calc non Af Amer: 85 mL/min/{1.73_m2} (ref >59)
Glucose: 97 mg/dL (ref 65–99)
Potassium: 3.9 mmol/L (ref 3.5–5.2)
Sodium: 141 mmol/L (ref 134–144)

## 2020-07-08 LAB — VITAMIN D 25 HYDROXY (VIT D DEFICIENCY, FRACTURES): Vit D, 25-Hydroxy: 9.3 ng/mL — ABNORMAL LOW (ref 30.0–100.0)

## 2020-07-08 LAB — TSH: TSH: 1.79 u[IU]/mL (ref 0.450–4.500)

## 2020-07-11 ENCOUNTER — Ambulatory Visit: Payer: Medicare HMO | Admitting: Family Medicine

## 2020-07-11 ENCOUNTER — Other Ambulatory Visit: Payer: Self-pay

## 2020-07-11 ENCOUNTER — Encounter: Payer: Self-pay | Admitting: Nurse Practitioner

## 2020-07-11 ENCOUNTER — Ambulatory Visit (INDEPENDENT_AMBULATORY_CARE_PROVIDER_SITE_OTHER): Payer: Medicare HMO | Admitting: Nurse Practitioner

## 2020-07-11 DIAGNOSIS — I10 Essential (primary) hypertension: Secondary | ICD-10-CM | POA: Diagnosis not present

## 2020-07-11 DIAGNOSIS — E559 Vitamin D deficiency, unspecified: Secondary | ICD-10-CM

## 2020-07-11 MED ORDER — HYDROCHLOROTHIAZIDE 12.5 MG PO CAPS: 12.5000 mg | ORAL_CAPSULE | Freq: Every day | ORAL | 1 refills | Status: DC

## 2020-07-11 MED ORDER — VITAMIN D (ERGOCALCIFEROL) 1.25 MG (50000 UNIT) PO CAPS: 50000.0000 [IU] | ORAL_CAPSULE | ORAL | 0 refills | Status: DC

## 2020-07-11 NOTE — Assessment & Plan Note (Signed)
Rx. Vit D

## 2020-07-11 NOTE — Progress Notes (Signed)
Reviewed with pt today; sent rx for vit D

## 2020-07-11 NOTE — Assessment & Plan Note (Signed)
-  BP elevated today -continue amlodipine -Rx. HCTZ

## 2020-07-11 NOTE — Progress Notes (Signed)
Established Patient Office Visit  Subjective:  Patient ID: Patricia Fuentes, female    DOB: 03-Jul-1945  Age: 75 y.o. MRN: [id number]  CC:  Chief Complaint  Patient presents with  . Follow-up    Go over labs.   . Hypertension    HPI [name] presents for follow-up visit.  Past Medical History:  Diagnosis Date  . Bronchitis   . Chronic back pain   . Essential hypertension   . Hyperlipidemia   . IGT (impaired glucose tolerance) 2015  . Obesity, unspecified   . Osteoarthritis   . Seasonal allergies     Past Surgical History:  Procedure Laterality Date  . CARDIAC CATHETERIZATION N/A 10/25/2014   Procedure: Left Heart Cath and Coronary Angiography;  Surgeon: Kathleene Hazel, MD;  Location: [facility] INVASIVE CV LAB;  Service: Cardiovascular;  Laterality: N/A;  . COLONOSCOPY N/A 02/09/2016   Procedure: COLONOSCOPY;  Surgeon: West Bali, MD;  Location: [facility];  Service: Endoscopy;  Laterality: N/A;  12:30 PM  . KNEE ARTHROSCOPY Left   . TOTAL ABDOMINAL HYSTERECTOMY  1995 approx   Fibroids  . TOTAL KNEE ARTHROPLASTY Right 2011  . TOTAL KNEE ARTHROPLASTY Left 04/29/2017   Procedure: TOTAL KNEE ARTHROPLASTY;  Surgeon: Vickki Hearing, MD;  Location: [facility] ORS;  Service: Orthopedics;  Laterality: Left;    Family History  Problem Relation Age of Onset  . Hypertension Mother   . Stroke Mother 82       Deceased due CVa at 93  . Diabetes Father   . Cancer Father 28       Unknown, somewhere in stomach  . Hypertension Brother   . Hypertension Brother   . Hypertension Brother   . Cancer Maternal Grandfather        ?type  . Hypertension Son     Social History   Socioeconomic History  . Marital status: Married    Spouse name: Not on file  . Number of children: Not on file  . Years of education: Not on file  . Highest education level: Not on file  Occupational History  . Occupation: retired    Associate Professor: UNEMPLOYED  Tobacco Use  . Smoking status:  Never Smoker  . Smokeless tobacco: Never Used  Vaping Use  . Vaping Use: Never used  Substance and Sexual Activity  . Alcohol use: No  . Drug use: No  . Sexual activity: Yes    Birth control/protection: Surgical  Other Topics Concern  . Not on file  Social History Narrative  . Not on file   Social Determinants of Health   Financial Resource Strain: Not on file  Food Insecurity: Not on file  Transportation Needs: No Transportation Needs  . Lack of Transportation (Medical): No  . Lack of Transportation (Non-Medical): No  Physical Activity: Inactive  . Days of Exercise per Week: 0 days  . Minutes of Exercise per Session: 0 min  Stress: Not on file  Social Connections: Moderately Integrated  . Frequency of Communication with Friends and Family: More than three times a week  . Frequency of Social Gatherings with Friends and Family: More than three times a week  . Attends Religious Services: More than 4 times per year  . Active Member of Clubs or Organizations: No  . Attends Banker Meetings: Never  . Marital Status: Married  Catering manager Violence: Not on file    Outpatient Medications Prior to Visit  Medication Sig Dispense Refill  .  albuterol (VENTOLIN HFA) 108 (90 Base) MCG/ACT inhaler Inhale 1-2 puffs into the lungs every 6 (six) hours as needed for wheezing or shortness of breath. 1 each 2  . amLODipine (NORVASC) 10 MG tablet TAKE 1 TABLET BY MOUTH ONCE A DAY. 90 tablet 0  . aspirin EC 81 MG tablet Take 162 mg by mouth every morning.     Marland Kitchen azithromycin (ZITHROMAX) 250 MG tablet Please dispense as a z-pack 6 tablet 0  . calcium-vitamin D (OSCAL-500) 500-400 MG-UNIT tablet Take 1 tablet by mouth 2 (two) times daily. 60 tablet 5  . guaiFENesin-dextromethorphan (ROBITUSSIN DM) 100-10 MG/5ML syrup Take 15 mLs by mouth every 6 (six) hours as needed for cough. 118 mL 0  . OXYGEN Inhale 2 L into the lungs at bedtime as needed.    Marland Kitchen UNABLE TO FIND Oxygen conserving  device DX J45.909. 1 each 0  . ergocalciferol (VITAMIN D2) 1.25 MG (50000 UT) capsule Take 1 capsule (50,000 Units total) by mouth once a week. One capsule once weekly 12 capsule 1   No facility-administered medications prior to visit.    Allergies  Allergen Reactions  . Promethazine Hcl Nausea And Vomiting and Other (See Comments)    dizzy    ROS Review of Systems  Constitutional: Negative.   Respiratory: Negative.   Cardiovascular: Negative.   Musculoskeletal: Negative.   Psychiatric/Behavioral: Negative.       Objective:    Physical Exam Constitutional:      Appearance: She is obese.  Cardiovascular:     Rate and Rhythm: Normal rate and regular rhythm.     Pulses: Normal pulses.     Heart sounds: Normal heart sounds.  Pulmonary:     Effort: Pulmonary effort is normal.     Breath sounds: Normal breath sounds.  Musculoskeletal:        General: Normal range of motion.  Neurological:     Mental Status: She is alert.  Psychiatric:        Mood and Affect: Mood normal.        Behavior: Behavior normal.        Thought Content: Thought content normal.        Judgment: Judgment normal.     BP (!) 163/61   Pulse 84   Temp 98.8 F (37.1 C)   Resp 18   Ht 5\' 2"  (1.575 m)   Wt 248 lb (112.5 kg)   SpO2 92%   BMI 45.36 kg/m  Wt Readings from Last 3 Encounters:  07/11/20 248 lb (112.5 kg)  05/23/20 245 lb (111.1 kg)  01/19/20 249 lb 0.6 oz (113 kg)     Health Maintenance Due  Topic Date Due  . TETANUS/TDAP  Never done  . COVID-19 Vaccine (3 - Booster for Moderna series) 01/16/2020    There are no preventive care reminders to display for this patient.  Lab Results  Component Value Date   TSH 1.790 07/07/2020   Lab Results  Component Value Date   WBC 6.0 07/08/2019   HGB 11.9 (L) 07/08/2019   HCT 38.7 07/08/2019   MCV 91.5 07/08/2019   PLT 176 07/08/2019   Lab Results  Component Value Date   NA 141 07/07/2020   K 3.9 07/07/2020   CO2 28  07/07/2020   GLUCOSE 97 07/07/2020   BUN 10 07/07/2020   CREATININE 0.71 07/07/2020   BILITOT 0.5 07/08/2019   ALKPHOS 61 07/08/2019   AST 15 07/08/2019   ALT 12 07/08/2019  PROT 7.7 07/08/2019   ALBUMIN 3.6 07/08/2019   CALCIUM 9.1 07/07/2020   ANIONGAP 11 07/08/2019   Lab Results  Component Value Date   CHOL 177 07/08/2019   Lab Results  Component Value Date   HDL 57 07/08/2019   Lab Results  Component Value Date   LDLCALC 106 (H) 07/08/2019   Lab Results  Component Value Date   TRIG 70 07/08/2019   Lab Results  Component Value Date   CHOLHDL 3.1 07/08/2019   Lab Results  Component Value Date   HGBA1C 5.7 (H) 11/10/2018      Assessment & Plan:   Problem List Items Addressed This Visit      Cardiovascular and Mediastinum   Essential hypertension    -BP elevated today -continue amlodipine -Rx. HCTZ      Relevant Medications   hydrochlorothiazide (MICROZIDE) 12.5 MG capsule     Other   Vitamin D deficiency    Rx. Vit D         Meds ordered this encounter  Medications  . hydrochlorothiazide (MICROZIDE) 12.5 MG capsule    Sig: Take 1 capsule (12.5 mg total) by mouth daily.    Dispense:  90 capsule    Refill:  1  . Vitamin D, Ergocalciferol, (DRISDOL) 1.25 MG (50000 UNIT) CAPS capsule    Sig: Take 1 capsule (50,000 Units total) by mouth every 7 (seven) days.    Dispense:  12 capsule    Refill:  0    Follow-up: Return in about 3 months (around 10/08/2020) for Med check (HCTZ and vit D, will get labs day-of).    Heather Roberts, NP

## 2020-07-12 ENCOUNTER — Other Ambulatory Visit (INDEPENDENT_AMBULATORY_CARE_PROVIDER_SITE_OTHER): Payer: Self-pay

## 2020-07-12 DIAGNOSIS — E662 Morbid (severe) obesity with alveolar hypoventilation: Secondary | ICD-10-CM | POA: Diagnosis not present

## 2020-07-12 DIAGNOSIS — Z4789 Encounter for other orthopedic aftercare: Secondary | ICD-10-CM | POA: Diagnosis not present

## 2020-07-12 DIAGNOSIS — Z96659 Presence of unspecified artificial knee joint: Secondary | ICD-10-CM | POA: Diagnosis not present

## 2020-07-12 MED ORDER — AMLODIPINE BESYLATE 10 MG PO TABS: 10.0000 mg | ORAL_TABLET | Freq: Every day | ORAL | 1 refills | Status: DC

## 2020-07-27 DIAGNOSIS — H25813 Combined forms of age-related cataract, bilateral: Secondary | ICD-10-CM | POA: Diagnosis not present

## 2020-07-28 ENCOUNTER — Other Ambulatory Visit: Payer: Self-pay

## 2020-07-28 ENCOUNTER — Encounter: Payer: Self-pay | Admitting: Internal Medicine

## 2020-07-28 ENCOUNTER — Telehealth (INDEPENDENT_AMBULATORY_CARE_PROVIDER_SITE_OTHER): Payer: Medicare HMO | Admitting: Internal Medicine

## 2020-07-28 DIAGNOSIS — J0110 Acute frontal sinusitis, unspecified: Secondary | ICD-10-CM | POA: Diagnosis not present

## 2020-07-28 MED ORDER — AZITHROMYCIN 250 MG PO TABS: ORAL_TABLET | ORAL | 0 refills | Status: DC

## 2020-07-28 NOTE — Progress Notes (Signed)
Virtual Visit via Telephone Note   This visit type was conducted due to national recommendations for restrictions regarding the COVID-19 Pandemic (e.g. social distancing) in an effort to limit this patient's exposure and mitigate transmission in our community.  Due to her co-morbid illnesses, this patient is at least at moderate risk for complications without adequate follow up.  This format is felt to be most appropriate for this patient at this time.  The patient did not have access to video technology/had technical difficulties with video requiring transitioning to audio format only (telephone).  All issues noted in this document were discussed and addressed.  No physical exam could be performed with this format.    Evaluation Performed:  Follow-up visit  Date:  07/28/2020   ID:  Patricia Fuentes, Patricia Fuentes 1946-01-20, MRN [id number]  Patient Location: Home Provider Location: Office/Clinic  Participants: Patient Location of Patient: Home Location of Provider: Telehealth Consent was obtain for visit to be over via telehealth. I verified that I am speaking with the correct person using two identifiers.  PCP:  Kerri Perches, MD   Chief Complaint:  Sinus pain and cough  History of Present Illness:    Patricia Fuentes is a 75 y.o. female who has a televisit for complaint of sinus pain/frontal headache and dry cough for about a week.  She had mild wheezing yesterday, for which she used albuterol inhaler and has been feeling better.  She also complains of postnasal drip. She denies any fever, chills, sore throat. she denies any recent sick contacts.  Of note, she has a history of OSA and admits that she had not been using CPAP regularly.  She is up-to-date with COVID vaccine.  The patient does not have symptoms concerning for COVID-19 infection (fever, chills, cough, or new shortness of breath).   Past Medical, Surgical, Social History, Allergies, and Medications have been  Reviewed.  Past Medical History:  Diagnosis Date  . Bronchitis   . Chronic back pain   . Essential hypertension   . Hyperlipidemia   . IGT (impaired glucose tolerance) 2015  . Obesity, unspecified   . Osteoarthritis   . Seasonal allergies    Past Surgical History:  Procedure Laterality Date  . CARDIAC CATHETERIZATION N/A 10/25/2014   Procedure: Left Heart Cath and Coronary Angiography;  Surgeon: Kathleene Hazel, MD;  Location: [facility] INVASIVE CV LAB;  Service: Cardiovascular;  Laterality: N/A;  . COLONOSCOPY N/A 02/09/2016   Procedure: COLONOSCOPY;  Surgeon: West Bali, MD;  Location: [facility];  Service: Endoscopy;  Laterality: N/A;  12:30 PM  . JOINT REPLACEMENT N/A    Phreesia 07/27/2020  . KNEE ARTHROSCOPY Left   . TOTAL ABDOMINAL HYSTERECTOMY  1995 approx   Fibroids  . TOTAL KNEE ARTHROPLASTY Right 2011  . TOTAL KNEE ARTHROPLASTY Left 04/29/2017   Procedure: TOTAL KNEE ARTHROPLASTY;  Surgeon: Vickki Hearing, MD;  Location: [facility] ORS;  Service: Orthopedics;  Laterality: Left;     Current Meds  Medication Sig  . albuterol (VENTOLIN HFA) 108 (90 Base) MCG/ACT inhaler Inhale 1-2 puffs into the lungs every 6 (six) hours as needed for wheezing or shortness of breath.  Marland Kitchen amLODipine (NORVASC) 10 MG tablet Take 1 tablet (10 mg total) by mouth daily.  Marland Kitchen aspirin EC 81 MG tablet Take 162 mg by mouth every morning.   . calcium-vitamin D (OSCAL-500) 500-400 MG-UNIT tablet Take 1 tablet by mouth 2 (two) times daily.  Marland Kitchen guaiFENesin-dextromethorphan (ROBITUSSIN DM) 100-10  MG/5ML syrup Take 15 mLs by mouth every 6 (six) hours as needed for cough.  . hydrochlorothiazide (MICROZIDE) 12.5 MG capsule Take 1 capsule (12.5 mg total) by mouth daily.  . OXYGEN Inhale 2 L into the lungs at bedtime as needed.  Marland Kitchen UNABLE TO FIND Oxygen conserving device DX J45.909.  . Vitamin D, Ergocalciferol, (DRISDOL) 1.25 MG (50000 UNIT) CAPS capsule Take 1 capsule (50,000 Units total) by mouth every 7  (seven) days.  . [DISCONTINUED] azithromycin (ZITHROMAX) 250 MG tablet Please dispense as a z-pack     Allergies:   Other and Promethazine hcl   ROS:   Please see the history of present illness.     All other systems reviewed and are negative.   Labs/Other Tests and Data Reviewed:    Recent Labs: 07/07/2020: BUN 10; Creatinine, Ser 0.71; Potassium 3.9; Sodium 141; TSH 1.790   Recent Lipid Panel Lab Results  Component Value Date/Time   CHOL 177 07/08/2019 01:50 PM   TRIG 70 07/08/2019 01:50 PM   HDL 57 07/08/2019 01:50 PM   CHOLHDL 3.1 07/08/2019 01:50 PM   LDLCALC 106 (H) 07/08/2019 01:50 PM   LDLCALC 92 02/11/2017 02:23 PM    Wt Readings from Last 3 Encounters:  07/11/20 248 lb (112.5 kg)  05/23/20 245 lb (111.1 kg)  01/19/20 249 lb 0.6 oz (113 kg)     ASSESSMENT & PLAN:    Acute sinusitis Persistent sinus pain/frontal headache for about a week Prescribed azithromycin Robitussin-DM as needed for cough  History of bronchitis Albuterol inhaler as needed for dyspnea/wheezing Advised to contact if persistent wheezing -we will add steroid dosepak  OSA on CPAP Advised to use CPAP regularly  Time:   Today, I have spent 11 minutes reviewing the chart, including problem list, medications, and with the patient with telehealth technology discussing the above problems.   Medication Adjustments/Labs and Tests Ordered: Current medicines are reviewed at length with the patient today.  Concerns regarding medicines are outlined above.   Tests Ordered: No orders of the defined types were placed in this encounter.   Medication Changes: Meds ordered this encounter  Medications  . azithromycin (ZITHROMAX) 250 MG tablet    Sig: Please dispense as a z-pack    Dispense:  6 tablet    Refill:  0     Note: This dictation was prepared with Dragon dictation along with smaller phrase technology. Similar sounding words can be transcribed inadequately or may not be corrected upon  review. Any transcriptional errors that result from this process are unintentional.      Disposition:  Follow up  Signed, Anabel Halon, MD  07/28/2020 9:47 AM     [facility]

## 2020-07-28 NOTE — Patient Instructions (Signed)

## 2020-08-09 DIAGNOSIS — E662 Morbid (severe) obesity with alveolar hypoventilation: Secondary | ICD-10-CM | POA: Diagnosis not present

## 2020-08-09 DIAGNOSIS — Z96659 Presence of unspecified artificial knee joint: Secondary | ICD-10-CM | POA: Diagnosis not present

## 2020-08-09 DIAGNOSIS — Z4789 Encounter for other orthopedic aftercare: Secondary | ICD-10-CM | POA: Diagnosis not present

## 2020-09-05 NOTE — Progress Notes (Signed)
Subjective:   Patricia Fuentes is a 75 y.o. female who presents for Medicare Annual (Subsequent) preventive examination.  I connected with [name] today by telephone and verified that I am speaking with the correct person using two identifiers. Location patient: home Location provider: work Persons participating in the virtual visit: patient, provider.   I discussed the limitations, risks, security and privacy concerns of performing an evaluation and management service by telephone and the availability of in person appointments. I also discussed with the patient that there may be a patient responsible charge related to this service. The patient expressed understanding and verbally consented to this telephonic visit.    Interactive audio and video telecommunications were attempted between this provider and patient, however failed, due to patient having technical difficulties OR patient did not have access to video capability.  We continued and completed visit with audio only.      Review of Systems    N/A  Cardiac Risk Factors include: advanced age (>95men, >63 women);hypertension;obesity (BMI >30kg/m2);dyslipidemia     Objective:    Today's Vitals   09/06/20 1501  PainSc: 0-No pain   There is no height or weight on file to calculate BMI.  Advanced Directives 09/06/2020 09/06/2019 09/03/2018 06/08/2018 06/08/2018 09/01/2017 07/23/2017  Does Patient Have a Medical Advance Directive? No No No No No No No  Type of Advance Directive - - - - - - -  Does patient want to make changes to medical advance directive? - - - - - - -  Would patient like information on creating a medical advance directive? No - Patient declined Yes (ED - Information included in AVS) - Yes (Inpatient - patient requests chaplain consult to create a medical advance directive) No - Patient declined Yes (MAU/Ambulatory/Procedural Areas - Information given) Yes (MAU/Ambulatory/Procedural Areas - Information given)     Current Medications (verified) Outpatient Encounter Medications as of 09/06/2020  Medication Sig  . albuterol (VENTOLIN HFA) 108 (90 Base) MCG/ACT inhaler Inhale 1-2 puffs into the lungs every 6 (six) hours as needed for wheezing or shortness of breath.  Marland Kitchen amLODipine (NORVASC) 10 MG tablet Take 1 tablet (10 mg total) by mouth daily.  Marland Kitchen aspirin EC 81 MG tablet Take 162 mg by mouth every morning.   . hydrochlorothiazide (MICROZIDE) 12.5 MG capsule Take 1 capsule (12.5 mg total) by mouth daily.  . OXYGEN Inhale 2 L into the lungs at bedtime as needed.  Marland Kitchen UNABLE TO FIND Oxygen conserving device DX J45.909.  . calcium-vitamin D (OSCAL-500) 500-400 MG-UNIT tablet Take 1 tablet by mouth 2 (two) times daily. (Patient not taking: Reported on 09/06/2020)  . guaiFENesin-dextromethorphan (ROBITUSSIN DM) 100-10 MG/5ML syrup Take 15 mLs by mouth every 6 (six) hours as needed for cough. (Patient not taking: Reported on 09/06/2020)  . Vitamin D, Ergocalciferol, (DRISDOL) 1.25 MG (50000 UNIT) CAPS capsule Take 1 capsule (50,000 Units total) by mouth every 7 (seven) days. (Patient not taking: Reported on 09/06/2020)  . [DISCONTINUED] azithromycin (ZITHROMAX) 250 MG tablet Please dispense as a z-pack   No facility-administered encounter medications on file as of 09/06/2020.    Allergies (verified) Other and Promethazine hcl   History: Past Medical History:  Diagnosis Date  . Bronchitis   . Chronic back pain   . Essential hypertension   . Hyperlipidemia   . IGT (impaired glucose tolerance) 2015  . Obesity, unspecified   . Osteoarthritis   . Seasonal allergies    Past Surgical History:  Procedure Laterality Date  .  CARDIAC CATHETERIZATION N/A 10/25/2014   Procedure: Left Heart Cath and Coronary Angiography;  Surgeon: Kathleene Hazel, MD;  Location: [facility] INVASIVE CV LAB;  Service: Cardiovascular;  Laterality: N/A;  . COLONOSCOPY N/A 02/09/2016   Procedure: COLONOSCOPY;  Surgeon: West Bali,  MD;  Location: [facility] ENDO SUITE;  Service: Endoscopy;  Laterality: N/A;  12:30 PM  . JOINT REPLACEMENT N/A    Phreesia 07/27/2020  . KNEE ARTHROSCOPY Left   . TOTAL ABDOMINAL HYSTERECTOMY  1995 approx   Fibroids  . TOTAL KNEE ARTHROPLASTY Right 2011  . TOTAL KNEE ARTHROPLASTY Left 04/29/2017   Procedure: TOTAL KNEE ARTHROPLASTY;  Surgeon: Vickki Hearing, MD;  Location: [facility] ORS;  Service: Orthopedics;  Laterality: Left;   Family History  Problem Relation Age of Onset  . Hypertension Mother   . Stroke Mother 53       Deceased due CVa at 65  . Diabetes Father   . Cancer Father 64       Unknown, somewhere in stomach  . Hypertension Brother   . Hypertension Brother   . Hypertension Brother   . Cancer Maternal Grandfather        ?type  . Hypertension Son    Social History   Socioeconomic History  . Marital status: Married    Spouse name: Not on file  . Number of children: Not on file  . Years of education: Not on file  . Highest education level: Not on file  Occupational History  . Occupation: retired    Associate Professor: UNEMPLOYED  Tobacco Use  . Smoking status: Never Smoker  . Smokeless tobacco: Never Used  Vaping Use  . Vaping Use: Never used  Substance and Sexual Activity  . Alcohol use: No  . Drug use: No  . Sexual activity: Yes    Birth control/protection: Surgical  Other Topics Concern  . Not on file  Social History Narrative  . Not on file   Social Determinants of Health   Financial Resource Strain: Low Risk   . Difficulty of Paying Living Expenses: Not hard at all  Food Insecurity: No Food Insecurity  . Worried About Programme researcher, broadcasting/film/video in the Last Year: Never true  . Ran Out of Food in the Last Year: Never true  Transportation Needs: No Transportation Needs  . Lack of Transportation (Medical): No  . Lack of Transportation (Non-Medical): No  Physical Activity: Inactive  . Days of Exercise per Week: 0 days  . Minutes of Exercise per Session: 0 min  Stress:  No Stress Concern Present  . Feeling of Stress : Not at all  Social Connections: Socially Integrated  . Frequency of Communication with Friends and Family: More than three times a week  . Frequency of Social Gatherings with Friends and Family: More than three times a week  . Attends Religious Services: More than 4 times per year  . Active Member of Clubs or Organizations: Yes  . Attends Banker Meetings: More than 4 times per year  . Marital Status: Married    Tobacco Counseling Counseling given: Not Answered   Clinical Intake:  Pre-visit preparation completed: Yes  Pain : No/denies pain Pain Score: 0-No pain     Nutritional Risks: None Diabetes: No  How often do you need to have someone help you when you read instructions, pamphlets, or other written materials from your doctor or pharmacy?: 1 - Never What is the last grade level you completed in school?: 12th grade  Diabetic?No  Interpreter Needed?: No  Information entered by :: SCrews, LPN   Activities of Daily Living In your present state of health, do you have any difficulty performing the following activities: 09/06/2020  Hearing? N  Vision? N  Difficulty concentrating or making decisions? N  Walking or climbing stairs? Y  Comment has joint pain  Dressing or bathing? N  Doing errands, shopping? N  Preparing Food and eating ? N  Using the Toilet? N  In the past six months, have you accidently leaked urine? N  Do you have problems with loss of bowel control? N  Managing your Medications? N  Managing your Finances? N  Housekeeping or managing your Housekeeping? N  Some recent data might be hidden    Patient Care Team: Kerri Perches, MD as PCP - General Laqueta Linden, MD (Inactive) as PCP - Cardiology (Cardiology) Vickki Hearing, MD as Consulting Physician (Orthopedic Surgery) West Bali, MD (Inactive) as Consulting Physician (Gastroenterology)  Indicate any recent  Medical Services you may have received from other than [facility] providers in the past year (date may be approximate).     Assessment:   This is a routine wellness examination for Keishia.  Hearing/Vision screen  Hearing Screening             Right ear:           Left ear:           Vision Screening Comments: Gets eyes examined once per year. Patient wears glasses. Has hx cataract surgery to left eye. Awaiting cataract removal right eye    Dietary issues and exercise activities discussed: Current Exercise Habits: The patient does not participate in regular exercise at present  Goals    . DIET - INCREASE WATER INTAKE     Drink 8 glasses of water     . DIET - REDUCE CALORIE INTAKE    . Exercise 3x per week (30 min per time)     Recommend chair exercises at least 3 times a week for at least 30 minutes at a time as tolerated.    Marland Kitchen LIFESTYLE - DECREASE FALLS RISK      Depression Screen PHQ 2/9 Scores 09/06/2020 07/28/2020 07/11/2020 05/23/2020 09/06/2019 03/10/2019 10/22/2018  PHQ - 2 Score 0 0 0 0 0 0 0    Fall Risk Fall Risk  09/06/2020 07/28/2020 07/11/2020 05/23/2020 01/19/2020  Falls in the past year? 0 0 0 0 0  Number falls in past yr: 0 0 0 0 -  Injury with Fall? 0 0 0 0 -  Risk for fall due to : No Fall Risks No Fall Risks No Fall Risks No Fall Risks -  Follow up - Falls evaluation completed Falls evaluation completed Falls evaluation completed -  Comment - - - - -    FALL RISK PREVENTION PERTAINING TO THE HOME:  Any stairs in or around the home? No  If so, are there any without handrails? No  Home free of loose throw rugs in walkways, pet beds, electrical cords, etc? Yes  Adequate lighting in your home to reduce risk of falls? Yes   ASSISTIVE DEVICES UTILIZED TO PREVENT FALLS:  Life alert? No  Use of a cane, walker or w/c? No  Grab bars in the bathroom? Yes  Shower chair or bench in shower? No  Elevated toilet seat or a  handicapped toilet? Yes     Cognitive Function:   Normal cognitive status assessed by direct observation by this  Nurse Health Advisor. No abnormalities found.     6CIT Screen 09/03/2018 09/01/2017 07/18/2016  What Year? 0 points 0 points 0 points  What month? 0 points 0 points 0 points  What time? 0 points 0 points 0 points  Count back from 20 0 points 0 points 0 points  Months in reverse 0 points 0 points 0 points  Repeat phrase 2 points 0 points 0 points  Total Score 2 0 0    Immunizations Immunization History  Administered Date(s) Administered  . Fluad Quad(high Dose 65+) 03/10/2019  . Influenza, High Dose Seasonal PF 05/05/2018  . Influenza,inj,Quad PF,6+ Mos 06/22/2013, 07/18/2014, 07/04/2015, 03/21/2016, 02/11/2017, 01/19/2020  . Moderna Sars-Covid-2 Vaccination 06/19/2019, 07/19/2019  . PPD Test 01/15/2018  . Pneumococcal Conjugate-13 12/27/2013  . Pneumococcal Polysaccharide-23 12/15/2012    TDAP status: Due, Education has been provided regarding the importance of this vaccine. Advised may receive this vaccine at local pharmacy or Health Dept. Aware to provide a copy of the vaccination record if obtained from local pharmacy or Health Dept. Verbalized acceptance and understanding.  Flu Vaccine status: Up to date  Pneumococcal vaccine status: Up to date  Covid-19 vaccine status: Completed vaccines  Qualifies for Shingles Vaccine? Yes   Zostavax completed No   Shingrix Completed?: No.    Education has been provided regarding the importance of this vaccine. Patient has been advised to call insurance company to determine out of pocket expense if they have not yet received this vaccine. Advised may also receive vaccine at local pharmacy or Health Dept. Verbalized acceptance and understanding.  Screening Tests Health Maintenance  Topic Date Due  . TETANUS/TDAP  Never done  . COVID-19 Vaccine (3 - Booster for Moderna series) 01/16/2020  . MAMMOGRAM  10/17/2020  . INFLUENZA  VACCINE  12/25/2020  . COLONOSCOPY (Pts 45-4yrs Insurance coverage will need to be confirmed)  02/08/2026  . DEXA SCAN  Completed  . Hepatitis C Screening  Completed  . PNA vac Low Risk Adult  Completed  . HPV VACCINES  Aged Out    Health Maintenance  Health Maintenance Due  Topic Date Due  . TETANUS/TDAP  Never done  . COVID-19 Vaccine (3 - Booster for Moderna series) 01/16/2020    Colorectal cancer screening: Type of screening: Colonoscopy. Completed 02/09/2016. Repeat every 10 years  Mammogram status: Completed 10/18/2019. Repeat every year  Bone Density status: Completed 07/09/2018. Results reflect: Bone density results: NORMAL. Repeat every 0 years.  Lung Cancer Screening: (Low Dose CT Chest recommended if Age 38-80 years, 30 pack-year currently smoking OR have quit w/in 15years.) does not qualify.   Lung Cancer Screening Referral: N/A   Additional Screening:  Hepatitis C Screening: does qualify; Completed 07/09/2018  Vision Screening: Recommended annual ophthalmology exams for early detection of glaucoma and other disorders of the eye. Is the patient up to date with their annual eye exam?  Yes  Who is the provider or what is the name of the office in which the patient attends annual eye exams? [facility] If pt is not established with a provider, would they like to be referred to a provider to establish care? No .   Dental Screening: Recommended annual dental exams for proper oral hygiene  Community Resource Referral / Chronic Care Management: CRR required this visit?  No   CCM required this visit?  No      Plan:     I have personally reviewed and noted the following in the patient's chart:   . Medical  and social history . Use of alcohol, tobacco or illicit drugs  . Current medications and supplements . Functional ability and status . Nutritional status . Physical activity . Advanced directives . List of other physicians . Hospitalizations, surgeries,  and ER visits in previous 12 months . Vitals . Screenings to include cognitive, depression, and falls . Referrals and appointments  In addition, I have reviewed and discussed with patient certain preventive protocols, quality metrics, and best practice recommendations. A written personalized care plan for preventive services as well as general preventive health recommendations were provided to patient.     Theodora BlowShannon Akashdeep Chuba, LPN   1/61/09604/13/2022   Nurse Notes: None

## 2020-09-06 ENCOUNTER — Telehealth (INDEPENDENT_AMBULATORY_CARE_PROVIDER_SITE_OTHER): Payer: Medicare HMO

## 2020-09-06 DIAGNOSIS — Z0000 Encounter for general adult medical examination without abnormal findings: Secondary | ICD-10-CM

## 2020-09-06 NOTE — Patient Instructions (Signed)
Patricia Fuentes , Thank you for taking time to come for your Medicare Wellness Visit. I appreciate your ongoing commitment to your health goals. Please review the following plan we discussed and let me know if I can assist you in the future.   Screening recommendations/referrals: Colonoscopy: Up to date, next due 02/08/2026 Mammogram: Up to date, 10/17/2020 Bone Density: No longer required  Recommended yearly ophthalmology/optometry visit for glaucoma screening and checkup Recommended yearly dental visit for hygiene and checkup  Vaccinations: Influenza vaccine: Up to date, next due fall 2022  Pneumococcal vaccine: Completed series  Tdap vaccine: Currently due, you may await an injury or you may receive at your local pharmacy or health department Shingles vaccine: Currently due for Shingrix, if you would like to receive we recommend that you do so at your local pharmacy as it is less expensive.    Advanced directives: Advance directive discussed with you today. Even though you declined this today please call our office should you change your mind and we can give you the proper paperwork for you to fill out.   Conditions/risks identified: None   Next appointment: 09/12/2021 @ 3:40 PM with Nurse Health Advisor    Preventive Care 73 Years and Older, Female Preventive care refers to lifestyle choices and visits with your health care provider that can promote health and wellness. What does preventive care include?  A yearly physical exam. This is also called an annual well check.  Dental exams once or twice a year.  Routine eye exams. Ask your health care provider how often you should have your eyes checked.  Personal lifestyle choices, including:  Daily care of your teeth and gums.  Regular physical activity.  Eating a healthy diet.  Avoiding tobacco and drug use.  Limiting alcohol use.  Practicing safe sex.  Taking low-dose aspirin every day.  Taking vitamin and mineral  supplements as recommended by your health care provider. What happens during an annual well check? The services and screenings done by your health care provider during your annual well check will depend on your age, overall health, lifestyle risk factors, and family history of disease. Counseling  Your health care provider may ask you questions about your:  Alcohol use.  Tobacco use.  Drug use.  Emotional well-being.  Home and relationship well-being.  Sexual activity.  Eating habits.  History of falls.  Memory and ability to understand (cognition).  Work and work Astronomer.  Reproductive health. Screening  You may have the following tests or measurements:  Height, weight, and BMI.  Blood pressure.  Lipid and cholesterol levels. These may be checked every 5 years, or more frequently if you are over 69 years old.  Skin check.  Lung cancer screening. You may have this screening every year starting at age 22 if you have a 30-pack-year history of smoking and currently smoke or have quit within the past 15 years.  Fecal occult blood test (FOBT) of the stool. You may have this test every year starting at age 25.  Flexible sigmoidoscopy or colonoscopy. You may have a sigmoidoscopy every 5 years or a colonoscopy every 10 years starting at age 59.  Hepatitis C blood test.  Hepatitis B blood test.  Sexually transmitted disease (STD) testing.  Diabetes screening. This is done by checking your blood sugar (glucose) after you have not eaten for a while (fasting). You may have this done every 1-3 years.  Bone density scan. This is done to screen for osteoporosis. You may have  this done starting at age 61.  Mammogram. This may be done every 1-2 years. Talk to your health care provider about how often you should have regular mammograms. Talk with your health care provider about your test results, treatment options, and if necessary, the need for more tests. Vaccines  Your  health care provider may recommend certain vaccines, such as:  Influenza vaccine. This is recommended every year.  Tetanus, diphtheria, and acellular pertussis (Tdap, Td) vaccine. You may need a Td booster every 10 years.  Zoster vaccine. You may need this after age 90.  Pneumococcal 13-valent conjugate (PCV13) vaccine. One dose is recommended after age 14.  Pneumococcal polysaccharide (PPSV23) vaccine. One dose is recommended after age 19. Talk to your health care provider about which screenings and vaccines you need and how often you need them. This information is not intended to replace advice given to you by your health care provider. Make sure you discuss any questions you have with your health care provider. Document Released: 06/09/2015 Document Revised: 01/31/2016 Document Reviewed: 03/14/2015 Elsevier Interactive Patient Education  2017 [place] Prevention in the Home Falls can cause injuries. They can happen to people of all ages. There are many things you can do to make your home safe and to help prevent falls. What can I do on the outside of my home?  Regularly fix the edges of walkways and driveways and fix any cracks.  Remove anything that might make you trip as you walk through a door, such as a raised step or threshold.  Trim any bushes or trees on the path to your home.  Use bright outdoor lighting.  Clear any walking paths of anything that might make someone trip, such as rocks or tools.  Regularly check to see if handrails are loose or broken. Make sure that both sides of any steps have handrails.  Any raised decks and porches should have guardrails on the edges.  Have any leaves, snow, or ice cleared regularly.  Use sand or salt on walking paths during winter.  Clean up any spills in your garage right away. This includes oil or grease spills. What can I do in the bathroom?  Use night lights.  Install grab bars by the toilet and in the tub and  shower. Do not use towel bars as grab bars.  Use non-skid mats or decals in the tub or shower.  If you need to sit down in the shower, use a plastic, non-slip stool.  Keep the floor dry. Clean up any water that spills on the floor as soon as it happens.  Remove soap buildup in the tub or shower regularly.  Attach bath mats securely with double-sided non-slip rug tape.  Do not have throw rugs and other things on the floor that can make you trip. What can I do in the bedroom?  Use night lights.  Make sure that you have a light by your bed that is easy to reach.  Do not use any sheets or blankets that are too big for your bed. They should not hang down onto the floor.  Have a firm chair that has side arms. You can use this for support while you get dressed.  Do not have throw rugs and other things on the floor that can make you trip. What can I do in the kitchen?  Clean up any spills right away.  Avoid walking on wet floors.  Keep items that you use a lot in easy-to-reach  places.  If you need to reach something above you, use a strong step stool that has a grab bar.  Keep electrical cords out of the way.  Do not use floor polish or wax that makes floors slippery. If you must use wax, use non-skid floor wax.  Do not have throw rugs and other things on the floor that can make you trip. What can I do with my stairs?  Do not leave any items on the stairs.  Make sure that there are handrails on both sides of the stairs and use them. Fix handrails that are broken or loose. Make sure that handrails are as long as the stairways.  Check any carpeting to make sure that it is firmly attached to the stairs. Fix any carpet that is loose or worn.  Avoid having throw rugs at the top or bottom of the stairs. If you do have throw rugs, attach them to the floor with carpet tape.  Make sure that you have a light switch at the top of the stairs and the bottom of the stairs. If you do not  have them, ask someone to add them for you. What else can I do to help prevent falls?  Wear shoes that:  Do not have high heels.  Have rubber bottoms.  Are comfortable and fit you well.  Are closed at the toe. Do not wear sandals.  If you use a stepladder:  Make sure that it is fully opened. Do not climb a closed stepladder.  Make sure that both sides of the stepladder are locked into place.  Ask someone to hold it for you, if possible.  Clearly mark and make sure that you can see:  Any grab bars or handrails.  First and last steps.  Where the edge of each step is.  Use tools that help you move around (mobility aids) if they are needed. These include:  Canes.  Walkers.  Scooters.  Crutches.  Turn on the lights when you go into a dark area. Replace any light bulbs as soon as they burn out.  Set up your furniture so you have a clear path. Avoid moving your furniture around.  If any of your floors are uneven, fix them.  If there are any pets around you, be aware of where they are.  Review your medicines with your doctor. Some medicines can make you feel dizzy. This can increase your chance of falling. Ask your doctor what other things that you can do to help prevent falls. This information is not intended to replace advice given to you by your health care provider. Make sure you discuss any questions you have with your health care provider. Document Released: 03/09/2009 Document Revised: 10/19/2015 Document Reviewed: 06/17/2014 Elsevier Interactive Patient Education  2017 [employer].

## 2020-09-08 ENCOUNTER — Other Ambulatory Visit: Payer: Self-pay | Admitting: Family Medicine

## 2020-09-09 DIAGNOSIS — Z96659 Presence of unspecified artificial knee joint: Secondary | ICD-10-CM | POA: Diagnosis not present

## 2020-09-09 DIAGNOSIS — E662 Morbid (severe) obesity with alveolar hypoventilation: Secondary | ICD-10-CM | POA: Diagnosis not present

## 2020-09-09 DIAGNOSIS — Z4789 Encounter for other orthopedic aftercare: Secondary | ICD-10-CM | POA: Diagnosis not present

## 2020-10-09 DIAGNOSIS — E662 Morbid (severe) obesity with alveolar hypoventilation: Secondary | ICD-10-CM | POA: Diagnosis not present

## 2020-10-09 DIAGNOSIS — Z4789 Encounter for other orthopedic aftercare: Secondary | ICD-10-CM | POA: Diagnosis not present

## 2020-10-09 DIAGNOSIS — Z96659 Presence of unspecified artificial knee joint: Secondary | ICD-10-CM | POA: Diagnosis not present

## 2020-10-10 ENCOUNTER — Ambulatory Visit: Payer: Medicare HMO | Admitting: Family Medicine

## 2020-10-11 ENCOUNTER — Encounter: Payer: Self-pay | Admitting: Family Medicine

## 2020-10-11 ENCOUNTER — Telehealth (INDEPENDENT_AMBULATORY_CARE_PROVIDER_SITE_OTHER): Payer: Medicare HMO | Admitting: Family Medicine

## 2020-10-11 ENCOUNTER — Other Ambulatory Visit: Payer: Self-pay

## 2020-10-11 VITALS — BP 137/72 | Ht 62.0 in | Wt 244.0 lb

## 2020-10-11 DIAGNOSIS — G4734 Idiopathic sleep related nonobstructive alveolar hypoventilation: Secondary | ICD-10-CM | POA: Diagnosis not present

## 2020-10-11 DIAGNOSIS — Z9981 Dependence on supplemental oxygen: Secondary | ICD-10-CM

## 2020-10-11 DIAGNOSIS — I10 Essential (primary) hypertension: Secondary | ICD-10-CM

## 2020-10-11 MED ORDER — ERGOCALCIFEROL 1.25 MG (50000 UT) PO CAPS: 50000.0000 [IU] | ORAL_CAPSULE | ORAL | 2 refills | Status: DC

## 2020-10-11 NOTE — Assessment & Plan Note (Signed)
  Patient re-educated about  the importance of commitment to a  minimum of 150 minutes of exercise per week as able.  The importance of healthy food choices with portion control discussed, as well as eating regularly and within a 12 hour window most days. The need to choose "clean , green" food 50 to 75% of the time is discussed, as well as to make water the primary drink and set a goal of 64 ounces water daily.    Weight /BMI 10/11/2020 09/06/2020 07/11/2020  WEIGHT 244 lb - 248 lb  HEIGHT 5\' 2"  - 5\' 2"   BMI 44.63 kg/m2 - 45.36 kg/m2

## 2020-10-11 NOTE — Assessment & Plan Note (Signed)
Documented nocturnal hypoxia/ sleep related hypoxia, requires supplemental oxygen while sleeping at 2L/ min Needs portable unit for travel away from home. Request for same urgent as has graduation to attend next week

## 2020-10-11 NOTE — Patient Instructions (Addendum)
Annual physical exam with mD first week in septemebr, call if you need me before. Flu vaccine at visit.  We will attempt to get oxygen in smaller cannister for your trip next week to [place]    Fasting lipid, cmp and EGFR 1 week before September appointment  Please schedule  Mammogram at checkout  Think about what you will eat, plan ahead. Choose " clean, green, fresh or frozen" over canned, processed or packaged foods which are more sugary, salty and fatty. 70 to 75% of food eaten should be vegetables and fruit. Three meals at set times with snacks allowed between meals, but they must be fruit or vegetables. Aim to eat over a 12 hour period , example 7 am to 7 pm, and STOP after  your last meal of the day. Drink water,generally about 64 ounces per day, no other drink is as healthy. Fruit juice is best enjoyed in a healthy way, by EATING the fruit.   It is important that you exercise regularly at least 30 minutes 5 times a week. If you develop chest pain, have severe difficulty breathing, or feel very tired, stop exercising immediately and seek medical attention

## 2020-10-11 NOTE — Progress Notes (Signed)
Virtual Visit via Telephone Note  I connected with Patricia Fuentes on 10/11/20 at  3:20 PM EDT by telephone and verified that I am speaking with the correct person using two identifiers.  Location: Patient: home Provider: office   I discussed the limitations, risks, security and privacy concerns of performing an evaluation and management service by telephone and the availability of in person appointments. I also discussed with the patient that there may be a patient responsible charge related to this service. The patient expressed understanding and agreed to proceed.   History of Present Illness: Uses oxygen only at night while sleeping has to travel out of town for graduation and is requesting smaller tank/ concentrator that she can travel with Has needed bedtime oxygen for over 2 years  No other concerns at this time Has been working on increased exercise and reduced food intake to facilitate weight loss Denies recent fever or chills. Denies sinus pressure, nasal congestion, ear pain or sore throat. Denies chest congestion, productive cough or wheezing. Denies chest pains, palpitations and leg swelling Denies abdominal pain, nausea, vomiting,diarrhea or constipation.   Denies dysuria, frequency, hesitancy or incontinence. Denies uncontrolled  joint pain, swelling and limitation in mobility. Denies headaches, seizures, numbness, or tingling. Denies depression, anxiety or insomnia. Denies skin break down or rash.     Observations/Objective: BP 137/72   Ht 5\' 2"  (1.575 m)   Wt 244 lb (110.7 kg)   BMI 44.63 kg/m  Good communication with no confusion and intact memory. Alert and oriented x 3 No signs of respiratory distress during speech    Assessment and Plan:  Essential hypertension Controlled, no change in medication DASH diet and commitment to daily physical activity for a minimum of 30 minutes discussed and encouraged, as a part of hypertension management. The  importance of attaining a healthy weight is also discussed.  BP/Weight 10/11/2020 09/06/2020 07/11/2020 05/23/2020 01/19/2020 11/11/2019 09/06/2019  Systolic BP 137 - 163 - 123 122 140  Diastolic BP 72 - 61 - 78 70 82  Wt. (Lbs) 244 - 248 245 249.04 245 243  BMI 44.63 - 45.36 44.81 45.55 44.81 43.74       Morbid obesity (HCC)  Patient re-educated about  the importance of commitment to a  minimum of 150 minutes of exercise per week as able.  The importance of healthy food choices with portion control discussed, as well as eating regularly and within a 12 hour window most days. The need to choose "clean , green" food 50 to 75% of the time is discussed, as well as to make water the primary drink and set a goal of 64 ounces water daily.    Weight /BMI 10/11/2020 09/06/2020 07/11/2020  WEIGHT 244 lb - 248 lb  HEIGHT 5\' 2"  - 5\' 2"   BMI 44.63 kg/m2 - 45.36 kg/m2      Sleep-related hypoxia Documented nocturnal hypoxia/ sleep related hypoxia, requires supplemental oxygen while sleeping at 2L/ min Needs portable unit for travel away from home. Request for same urgent as has graduation to attend next week  Supplemental oxygen dependent Documented hypoxia during sleep. Needs portable oxygen unit for use when away from home, sam to be ordered for asap delivery   Follow Up Instructions:    I discussed the assessment and treatment plan with the patient. The patient was provided an opportunity to ask questions and all were answered. The patient agreed with the plan and demonstrated an understanding of the instructions.   The patient was  advised to call back or seek an in-person evaluation if the symptoms worsen or if the condition fails to improve as anticipated.  I provided 12  minutes of non-face-to-face time during this encounter.   Syliva Overman, MD

## 2020-10-11 NOTE — Assessment & Plan Note (Signed)
Documented hypoxia during sleep. Needs portable oxygen unit for use when away from home, sam to be ordered for asap delivery

## 2020-10-11 NOTE — Assessment & Plan Note (Signed)
Controlled, no change in medication DASH diet and commitment to daily physical activity for a minimum of 30 minutes discussed and encouraged, as a part of hypertension management. The importance of attaining a healthy weight is also discussed.  BP/Weight 10/11/2020 09/06/2020 07/11/2020 05/23/2020 01/19/2020 11/11/2019 09/06/2019  Systolic BP 137 - 163 - 123 122 140  Diastolic BP 72 - 61 - 78 70 82  Wt. (Lbs) 244 - 248 245 249.04 245 243  BMI 44.63 - 45.36 44.81 45.55 44.81 43.74

## 2020-10-12 NOTE — Addendum Note (Signed)
Addended by: Abner Greenspan on: 10/12/2020 09:32 AM   Modules accepted: Orders

## 2020-10-16 ENCOUNTER — Telehealth: Payer: Self-pay

## 2020-10-16 NOTE — Telephone Encounter (Signed)
Patient called asking if portable oxygen was sent to [facility], she will be leaving out of town this Friday. Patient asked if we will call her back to let her know. [phone].

## 2020-10-16 NOTE — Telephone Encounter (Signed)
Called [facility] and they gave me another fax number to fax the order to and to send it as urgent

## 2020-10-16 NOTE — Telephone Encounter (Signed)
It was sent to them but I haven't heard anything back

## 2020-10-18 DIAGNOSIS — J45909 Unspecified asthma, uncomplicated: Secondary | ICD-10-CM | POA: Diagnosis not present

## 2020-10-18 DIAGNOSIS — G4733 Obstructive sleep apnea (adult) (pediatric): Secondary | ICD-10-CM | POA: Diagnosis not present

## 2020-10-18 DIAGNOSIS — E662 Morbid (severe) obesity with alveolar hypoventilation: Secondary | ICD-10-CM | POA: Diagnosis not present

## 2020-11-09 DIAGNOSIS — Z96659 Presence of unspecified artificial knee joint: Secondary | ICD-10-CM | POA: Diagnosis not present

## 2020-11-09 DIAGNOSIS — Z4789 Encounter for other orthopedic aftercare: Secondary | ICD-10-CM | POA: Diagnosis not present

## 2020-11-09 DIAGNOSIS — E662 Morbid (severe) obesity with alveolar hypoventilation: Secondary | ICD-10-CM | POA: Diagnosis not present

## 2020-12-09 DIAGNOSIS — Z96659 Presence of unspecified artificial knee joint: Secondary | ICD-10-CM | POA: Diagnosis not present

## 2020-12-09 DIAGNOSIS — Z4789 Encounter for other orthopedic aftercare: Secondary | ICD-10-CM | POA: Diagnosis not present

## 2020-12-09 DIAGNOSIS — E662 Morbid (severe) obesity with alveolar hypoventilation: Secondary | ICD-10-CM | POA: Diagnosis not present

## 2020-12-30 ENCOUNTER — Other Ambulatory Visit: Payer: Self-pay | Admitting: Family Medicine

## 2021-01-09 DIAGNOSIS — E662 Morbid (severe) obesity with alveolar hypoventilation: Secondary | ICD-10-CM | POA: Diagnosis not present

## 2021-01-09 DIAGNOSIS — Z96659 Presence of unspecified artificial knee joint: Secondary | ICD-10-CM | POA: Diagnosis not present

## 2021-01-09 DIAGNOSIS — Z4789 Encounter for other orthopedic aftercare: Secondary | ICD-10-CM | POA: Diagnosis not present

## 2021-01-30 ENCOUNTER — Encounter: Payer: Medicare HMO | Admitting: Family Medicine

## 2021-02-02 ENCOUNTER — Telehealth: Payer: Self-pay | Admitting: *Deleted

## 2021-02-02 NOTE — Chronic Care Management (AMB) (Signed)
  Chronic Care Management   Note  02/02/2021 Name: Patricia Fuentes MRN: [id number] DOB: April 22, 1946  Patricia Fuentes is a 75 y.o. year old female who is a primary care patient of Fayrene Helper, MD. I reached out to Carrolyn Meiers by phone today in response to a referral sent by [name]'s PCP, Dr. Moshe Cipro.      [name] was given information about Chronic Care Management services today including:  CCM service includes personalized support from designated clinical staff supervised by her physician, including individualized plan of care and coordination with other care providers 24/7 contact phone numbers for assistance for urgent and routine care needs. Service will only be billed when office clinical staff spend 20 minutes or more in a month to coordinate care. Only one practitioner may furnish and bill the service in a calendar month. The patient may stop CCM services at any time (effective at the end of the month) by phone call to the office staff. The patient will be responsible for cost sharing (co-pay) of up to 20% of the service fee (after annual deductible is met).  Patient agreed to services and verbal consent obtained.   Follow up plan: Telephone appointment with care management team member scheduled for:02/08/21  [facility]  Direct Dial: [phone]

## 2021-02-08 ENCOUNTER — Ambulatory Visit (INDEPENDENT_AMBULATORY_CARE_PROVIDER_SITE_OTHER): Payer: Medicare HMO | Admitting: *Deleted

## 2021-02-08 DIAGNOSIS — I10 Essential (primary) hypertension: Secondary | ICD-10-CM

## 2021-02-08 DIAGNOSIS — E785 Hyperlipidemia, unspecified: Secondary | ICD-10-CM

## 2021-02-08 NOTE — Chronic Care Management (AMB) (Signed)
Chronic Care Management   CCM RN Visit Note  02/08/2021 Name: Patricia Fuentes MRN: [id number] DOB: 07-03-1945  Subjective: Patricia Fuentes is a 75 y.o. year old female who is a primary care patient of Moshe Cipro Norwood Levo, MD. The care management team was consulted for assistance with disease management and care coordination needs.    Engaged with patient by telephone for initial visit in response to provider referral for case management and/or care coordination services.   Consent to Services:  The patient was given the following information about Chronic Care Management services today, agreed to services, and gave verbal consent: 1. CCM service includes personalized support from designated clinical staff supervised by the primary care provider, including individualized plan of care and coordination with other care providers 2. 24/7 contact phone numbers for assistance for urgent and routine care needs. 3. Service will only be billed when office clinical staff spend 20 minutes or more in a month to coordinate care. 4. Only one practitioner may furnish and bill the service in a calendar month. 5.The patient may stop CCM services at any time (effective at the end of the month) by phone call to the office staff. 6. The patient will be responsible for cost sharing (co-pay) of up to 20% of the service fee (after annual deductible is met). Patient agreed to services and consent obtained.  Patient agreed to services and verbal consent obtained.   Assessment: Review of patient past medical history, allergies, medications, health status, including review of consultants reports, laboratory and other test data, was performed as part of comprehensive evaluation and provision of chronic care management services.   SDOH (Social Determinants of Health) assessments and interventions performed:  SDOH Interventions    Flowsheet Row Most Recent Value  SDOH Interventions   Food Insecurity Interventions  Intervention Not Indicated  Transportation Interventions Intervention Not Indicated        CCM Care Plan  Allergies  Allergen Reactions   Other    Promethazine Hcl Nausea And Vomiting and Other (See Comments)    dizzy    Outpatient Encounter Medications as of 02/08/2021  Medication Sig   albuterol (VENTOLIN HFA) 108 (90 Base) MCG/ACT inhaler Inhale 1-2 puffs into the lungs every 6 (six) hours as needed for wheezing or shortness of breath.   amLODipine (NORVASC) 10 MG tablet TAKE 1 TABLET BY MOUTH ONCE A DAY.   aspirin EC 81 MG tablet Take 162 mg by mouth every morning.    calcium-vitamin D (OSCAL-500) 500-400 MG-UNIT tablet Take 1 tablet by mouth 2 (two) times daily.   ergocalciferol (VITAMIN D2) 1.25 MG (50000 UT) capsule Take 1 capsule (50,000 Units total) by mouth once a week. One capsule once weekly   hydrochlorothiazide (MICROZIDE) 12.5 MG capsule Take 1 capsule (12.5 mg total) by mouth daily.   OXYGEN Inhale 2 L into the lungs at bedtime as needed.   UNABLE TO FIND Oxygen conserving device DX J45.909. (Patient not taking: Reported on 02/08/2021)   No facility-administered encounter medications on file as of 02/08/2021.    Patient Active Problem List   Diagnosis Date Noted   Morbid obesity ([place]) 03/13/2019   Class 3 severe obesity due to excess calories with serious comorbidity and body mass index (BMI) of 40.0 to 44.9 in adult [name] 12/15/2018   Supplemental oxygen dependent 12/15/2018   Sleep-related hypoxia 12/15/2018   Educated about COVID-19 virus infection 10/22/2018   Obesity with alveolar hypoventilation and body mass index (BMI) of 40 or greater (  [name] 09/17/2018   Insomnia due to medical condition 09/17/2018   Loud snoring 09/17/2018   Hypersomnia with sleep apnea 09/17/2018   History of orthopnea 09/17/2018   OSA on CPAP    Reactive airway disease    S/P total knee arthroplasty, left 04/29/17 05/06/2017   Anemia 05/06/2017   Primary osteoarthritis of both  knees    Abnormal CT scan, colon 01/18/2016   DOE (dyspnea on exertion)    Abnormal myocardial perfusion study    Fatigue 07/18/2014   Vitamin D deficiency 07/18/2014   Exercise intolerance 07/18/2014   Sleep disorder 07/18/2014   Nonspecific abnormal electrocardiogram (ECG) (EKG) 07/18/2014   Reduced vision 12/27/2013   Radicular pain of thoracic region 06/22/2013   Prediabetes 27/11/8673   Metabolic syndrome X 44/92/0100   BACK PAIN WITH RADICULOPATHY 09/20/2008   Hyperlipidemia LDL goal <100 03/29/2008   Obesity hypoventilation syndrome ([place]) 03/29/2008   Essential hypertension 10/07/2007    Conditions to be addressed/monitored:HTN and HLD  Care Plan : General Plan of Care (Hyperlipidemia)  Updates made by Kassie Mends, RN since 02/08/2021 12:00 AM     Problem: Health Promotion or Disease Self-Management (General Plan of Care)      Long-Range Goal: Hyperlipidemia Self-Management Plan Developed   Start Date: 02/08/2021  Expected End Date: 08/08/2021  This Visit's Progress: On track  Priority: Medium  Note:   Current Barriers:  Poorly controlled hyperlipidemia, complicated by diet, lack of exercise Current antihyperlipidemic regimen: none Most recent lipid panel:     Component Value Date/Time   CHOL 177 07/08/2019 1350   TRIG 70 07/08/2019 1350   HDL 57 07/08/2019 1350   CHOLHDL 3.1 07/08/2019 1350   VLDL 14 07/08/2019 1350   LDLCALC 106 (H) 07/08/2019 1350   LDLCALC 92 02/11/2017 1423  Patient reports she lives with spouse, is independent with ADL, IADL's, still drives and works part time, walks "some" but has no formal exercise plan.  Pt requests advanced directives documents be mailed.   ASCVD risk enhancing conditions: age >38, HTN Does not adhere to provider recommendations re:  does not always adhere to heart healthy, low sodium diet, eats fast food at times. RN Care Manager Clinical Goal(s):  patient will work with Consulting civil engineer, providers, and care team  towards execution of optimized self-health management plan patient will take all medications exactly as prescribed and will call provider for medication related questions patient will attend all scheduled medical appointments patient will demonstrate improved adherence to prescribed treatment plan for hyperlipidemia Interventions: Collaboration with Fayrene Helper, MD regarding development and update of comprehensive plan of care as evidenced by provider attestation and co-signature Inter-disciplinary care team collaboration (see longitudinal plan of care) Medication review performed; medication list updated in electronic medical record.  Inter-disciplinary care team collaboration (see longitudinal plan of care) Evaluation of current treatment plan related to hyperlipidemia and patient's adherence to plan as established by provider. Provided education to patient and/or caregiver about advanced directives- mailed packet and education to patient's home Reviewed medications with patient and discussed importance of taking as prescribed Provided patient with educational materials related to heart healthy diet Reviewed scheduled/upcoming provider appointments including: 10/7 primary care provider Discussed plans with patient for ongoing care management follow up and provided patient with direct contact information for care management team Discussed avoid trans/ saturated fats in diet, prepare foods baked or broiled instead of fried Encouraged exercise daily Patient Goals/Self-Care Activities: - complete a health care power of attorney and  living will - advanced directives packet mailed to you - call RN care manager if any questions - make shared treatment decisions with doctor - spend time outdoors at least 3 times a week  - avoid trans/saturated fat in your diet - bake or broil food instead of frying - call for medicine refill 2 or 3 days before it runs out - keep a list of all the  medicines I take; vitamins and herbals too - change to whole grain breads, cereal, pasta - drink 6 to 8 glasses of water each day - eat 3 to 5 servings of fruits and vegetables each day - fill half the plate with nonstarchy vegetables - limit fast food meals to no more than 1 per week - manage portion size - read food labels for fat, fiber, carbohydrates and portion size - if I have chest pain, call for help - learn about small changes that will make a big difference Follow Up Plan: Telephone follow up appointment with care management team member scheduled for:  04/03/2021      Care Plan : Hypertension (Adult)  Updates made by Kassie Mends, RN since 02/08/2021 12:00 AM     Problem: Hypertension (Hypertension)   Priority: Medium     Long-Range Goal: Hypertension Monitored   Start Date: 02/08/2021  Expected End Date: 08/08/2021  This Visit's Progress: On track  Priority: Medium  Note:   Objective:  Last practice recorded BP readings:  BP Readings from Last 3 Encounters:  10/11/20 137/72  07/11/20 (!) 163/61  01/19/20 123/78   Most recent eGFR/CrCl: No results found for: EGFR  No components found for: CRCL Current Barriers:  Knowledge Deficits related to basic understanding of hypertension pathophysiology and self care management- can benefit from reinforcement management of hypertension, diet, monitoring blood pressure.  Patient reports she has been trying to get portable oxygen for several months now and having difficulty getting the company to call her back (she is not exactly sure the company name, she found this company on facebook). Pt uses oxygen at 2 liters at hs only with CPAP machine. Pt reports [facility] supplies both CPAP and oxygen, pt reports she called [employer], they no longer supply portable oxygen and she was given number of another company (not sure name of this company) and states they did not call her back either.  Patient reports issues with CPAP that  does not always fit correctly. Does not adhere to provider recommendations re:  does not always eat low sodium, heart healthy diet Case Manager Clinical Goal(s):  patient will verbalize understanding of plan for hypertension management patient will attend all scheduled medical appointments: primary care provider 03/02/21 patient will demonstrate improved adherence to prescribed treatment plan for hypertension as evidenced by taking all medications as prescribed, monitoring and recording blood pressure as directed, adhering to low sodium/DASH diet Interventions:  Collaboration with Fayrene Helper, MD regarding development and update of comprehensive plan of care as evidenced by provider attestation and co-signature Inter-disciplinary care team collaboration (see longitudinal plan of care) Evaluation of current treatment plan related to hypertension self management and patient's adherence to plan as established by provider. Provided education to patient re: stroke prevention, s/s of heart attack and stroke, DASH diet, complications of uncontrolled blood pressure Reviewed medications with patient and discussed importance of compliance Discussed plans with patient for ongoing care management follow up and provided patient with direct contact information for care management team Advised patient, providing education and rationale, to  monitor blood pressure 3 x week  and record, calling PCP for findings outside established parameters.  Reviewed scheduled/upcoming provider appointments including:  primary care provider 03/02/2021 Reviewed importance of following low sodium diet, reviewed foods high in sodium to avoid/ limit Mailed education - low sodium diet RN care manager instructed pt to call [facility] (as they are the DME supplier for 02 and CPAP) and request once again about portable oxygen and follow through on whomever they ask her to contact, also ask them to come out to her home to  check the CPAP for any issues she is having, pt verbalizes understanding and states she is able to do this.  RN care manager sent in basket update to Dr. Moshe Cipro (primary care provider) with update on the DME/ portable oxygen, CPAP issues Self-Care Activities:  Attends all scheduled provider appointments Calls provider office for new concerns, questions, or BP outside discussed parameters Checks BP and records as discussed Follows a low sodium diet/DASH diet Patient Goals: - check blood pressure 3 times per week - choose a place to take my blood pressure (home, clinic or office, retail store) - write blood pressure results in a log or diary  - follow low salt diet- avoid / limit fast food and salty snacks - look over education mailed to you - low sodium diet - call your DME provider for oxygen/ CPAP if any questions or concerns ([place]) Follow Up Plan: Telephone follow up appointment with care management team member scheduled for:   04/03/2021     Plan:Telephone follow up appointment with care management team member scheduled for:  04/03/2021  Jacqlyn Larsen [facility], BSN RN Case Manager [facility] [phone]

## 2021-02-08 NOTE — Patient Instructions (Signed)
Visit Information   PATIENT GOALS:   Goals Addressed             This Visit's Progress    Hyperlipidemia       Timeframe:  Long-Range Goal Priority:  Medium Start Date:         02/08/2021                    Expected End Date:    08/08/2021                   Follow Up Date 04/03/2021   - complete a health care power of attorney and  living will - advanced directives packet mailed to you - call RN care manager if any questions - make shared treatment decisions with doctor - spend time outdoors at least 3 times a week  - avoid trans/saturated fat in your diet - bake or broil food instead of frying   Why is this important?   Having a long-term illness can be scary.  It can also be stressful for you and your caregiver.  These steps may help.    Notes:      Track and Manage My Blood Pressure-Hypertension       Timeframe:  Long-Range Goal Priority:  Medium Start Date:               02/09/2021              Expected End Date:     08/09/2021                  Follow Up Date 04/03/2021   - check blood pressure 3 times per week - choose a place to take my blood pressure (home, clinic or office, retail store) - write blood pressure results in a log or diary  - follow low salt diet- avoid / limit fast food and salty snacks - look over education mailed to you - low sodium diet - call your DME provider for oxygen/ CPAP if any questions or concerns ([place])   Why is this important?   You won't feel high blood pressure, but it can still hurt your blood vessels.  High blood pressure can cause heart or kidney problems. It can also cause a stroke.  Making lifestyle changes like losing a little weight or eating less salt will help.  Checking your blood pressure at home and at different times of the day can help to control blood pressure.  If the doctor prescribes medicine remember to take it the way the doctor ordered.  Call the office if you cannot afford the medicine or if  there are questions about it.     Notes:         Consent to CCM Services: Ms. Mceuen was given information about Chronic Care Management services including:  CCM service includes personalized support from designated clinical staff supervised by her physician, including individualized plan of care and coordination with other care providers 24/7 contact phone numbers for assistance for urgent and routine care needs. Service will only be billed when office clinical staff spend 20 minutes or more in a month to coordinate care. Only one practitioner may furnish and bill the service in a calendar month. The patient may stop CCM services at any time (effective at the end of the month) by phone call to the office staff. The patient will be responsible for cost sharing (co-pay) of up to 20% of the service  fee (after annual deductible is met).  Patient agreed to services and verbal consent obtained.   The patient verbalized understanding of instructions, educational materials, and care plan provided today and agreed to receive a mailed copy of patient instructions, educational materials, and care plan.   Heart-Healthy Eating Plan Many factors influence your heart (coronary) health, including eating and exercise habits. Coronary risk increases with abnormal blood fat (lipid) levels. Heart-healthy meal planning includes limiting unhealthy fats, increasing healthy fats, and making other diet and lifestyle changes. What is my plan? Your health care provider may recommend that you: Limit your fat intake to _________% or less of your total calories each day. Limit your saturated fat intake to _________% or less of your total calories each day. Limit the amount of cholesterol in your diet to less than _________ mg per day. What are tips for following this plan? Cooking Cook foods using methods other than frying. Baking, boiling, grilling, and broiling are all good options. Other ways to reduce fat  include: Removing the skin from poultry. Removing all visible fats from meats. Steaming vegetables in water or broth. Meal planning  At meals, imagine dividing your plate into fourths: Fill one-half of your plate with vegetables and green salads. Fill one-fourth of your plate with whole grains. Fill one-fourth of your plate with lean protein foods. Eat 4-5 servings of vegetables per day. One serving equals 1 cup raw or cooked vegetable, or 2 cups raw leafy greens. Eat 4-5 servings of fruit per day. One serving equals 1 medium whole fruit,  cup dried fruit,  cup fresh, frozen, or canned fruit, or  cup 100% fruit juice. Eat more foods that contain soluble fiber. Examples include apples, broccoli, carrots, beans, peas, and barley. Aim to get 25-30 g of fiber per day. Increase your consumption of legumes, nuts, and seeds to 4-5 servings per week. One serving of dried beans or legumes equals  cup cooked, 1 serving of nuts is  cup, and 1 serving of seeds equals 1 tablespoon. Fats Choose healthy fats more often. Choose monounsaturated and polyunsaturated fats, such as olive and canola oils, flaxseeds, walnuts, almonds, and seeds. Eat more omega-3 fats. Choose salmon, mackerel, sardines, tuna, flaxseed oil, and ground flaxseeds. Aim to eat fish at least 2 times each week. Check food labels carefully to identify foods with trans fats or high amounts of saturated fat. Limit saturated fats. These are found in animal products, such as meats, butter, and cream. Plant sources of saturated fats include palm oil, palm kernel oil, and coconut oil. Avoid foods with partially hydrogenated oils in them. These contain trans fats. Examples are stick margarine, some tub margarines, cookies, crackers, and other baked goods. Avoid fried foods. General information Eat more home-cooked food and less restaurant, buffet, and fast food. Limit or avoid alcohol. Limit foods that are high in starch and sugar. Lose  weight if you are overweight. Losing just 5-10% of your body weight can help your overall health and prevent diseases such as diabetes and heart disease. Monitor your salt (sodium) intake, especially if you have high blood pressure. Talk with your health care provider about your sodium intake. Try to incorporate more vegetarian meals weekly. What foods can I eat? Fruits All fresh, canned (in natural juice), or frozen fruits. Vegetables Fresh or frozen vegetables (raw, steamed, roasted, or grilled). Green salads. Grains Most grains. Choose whole wheat and whole grains most of the time. Rice and pasta, including brown rice and pastas made with whole  wheat. Meats and other proteins Lean, well-trimmed beef, veal, pork, and lamb. Chicken and [place] without skin. All fish and shellfish. Wild duck, rabbit, pheasant, and venison. Egg whites or low-cholesterol egg substitutes. Dried beans, peas, lentils, and tofu. Seeds and most nuts. Dairy Low-fat or nonfat cheeses, including ricotta and mozzarella. Skim or 1% milk (liquid, powdered, or evaporated). Buttermilk made with low-fat milk. Nonfat or low-fat yogurt. Fats and oils Non-hydrogenated (trans-free) margarines. Vegetable oils, including soybean, sesame, sunflower, olive, peanut, safflower, corn, canola, and cottonseed. Salad dressings or mayonnaise made with a vegetable oil. Beverages Water (mineral or sparkling). Coffee and tea. Diet carbonated beverages. Sweets and desserts Sherbet, gelatin, and fruit ice. Small amounts of dark chocolate. Limit all sweets and desserts. Seasonings and condiments All seasonings and condiments. The items listed above may not be a complete list of foods and beverages you can eat. Contact a dietitian for more options. What foods are not recommended? Fruits Canned fruit in heavy syrup. Fruit in cream or butter sauce. Fried fruit. Limit coconut. Vegetables Vegetables cooked in cheese, cream, or butter sauce. Fried  vegetables. Grains Breads made with saturated or trans fats, oils, or whole milk. Croissants. Sweet rolls. Donuts. High-fat crackers, such as cheese crackers. Meats and other proteins Fatty meats, such as hot dogs, ribs, sausage, bacon, rib-eye roast or steak. High-fat deli meats, such as salami and bologna. Caviar. Domestic duck and goose. Organ meats, such as liver. Dairy Cream, sour cream, cream cheese, and creamed cottage cheese. Whole milk cheeses. Whole or 2% milk (liquid, evaporated, or condensed). Whole buttermilk. Cream sauce or high-fat cheese sauce. Whole-milk yogurt. Fats and oils Meat fat, or shortening. Cocoa butter, hydrogenated oils, palm oil, coconut oil, palm kernel oil. Solid fats and shortenings, including bacon fat, salt pork, lard, and butter. Nondairy cream substitutes. Salad dressings with cheese or sour cream. Beverages Regular sodas and any drinks with added sugar. Sweets and desserts Frosting. Pudding. Cookies. Cakes. Pies. Milk chocolate or white chocolate. Buttered syrups. Full-fat ice cream or ice cream drinks. The items listed above may not be a complete list of foods and beverages to avoid. Contact a dietitian for more information. Summary Heart-healthy meal planning includes limiting unhealthy fats, increasing healthy fats, and making other diet and lifestyle changes. Lose weight if you are overweight. Losing just 5-10% of your body weight can help your overall health and prevent diseases such as diabetes and heart disease. Focus on eating a balance of foods, including fruits and vegetables, low-fat or nonfat dairy, lean protein, nuts and legumes, whole grains, and heart-healthy oils and fats. This information is not intended to replace advice given to you by your health care provider. Make sure you discuss any questions you have with your health care provider. Document Revised: 06/20/2017 Document Reviewed: 06/20/2017 Elsevier Patient Education  2022 [employer] Eating Plan DASH stands for Dietary Approaches to Stop Hypertension. The DASH eating plan is a healthy eating plan that has been shown to: Reduce high blood pressure (hypertension). Reduce your risk for type 2 diabetes, heart disease, and stroke. Help with weight loss. What are tips for following this plan? Reading food labels Check food labels for the amount of salt (sodium) per serving. Choose foods with less than 5 percent of the Daily Value of sodium. Generally, foods with less than 300 milligrams (mg) of sodium per serving fit into this eating plan. To find whole grains, look for the word "whole" as the first word in the ingredient list. Shopping Buy  products labeled as "low-sodium" or "no salt added." Buy fresh foods. Avoid canned foods and pre-made or frozen meals. Cooking Avoid adding salt when cooking. Use salt-free seasonings or herbs instead of table salt or sea salt. Check with your health care provider or pharmacist before using salt substitutes. Do not fry foods. Cook foods using healthy methods such as baking, boiling, grilling, roasting, and broiling instead. Cook with heart-healthy oils, such as olive, canola, avocado, soybean, or sunflower oil. Meal planning  Eat a balanced diet that includes: 4 or more servings of fruits and 4 or more servings of vegetables each day. Try to fill one-half of your plate with fruits and vegetables. 6-8 servings of whole grains each day. Less than 6 oz (170 g) of lean meat, poultry, or fish each day. A 3-oz (85-g) serving of meat is about the same size as a deck of cards. One egg equals 1 oz (28 g). 2-3 servings of low-fat dairy each day. One serving is 1 cup (237 mL). 1 serving of nuts, seeds, or beans 5 times each week. 2-3 servings of heart-healthy fats. Healthy fats called omega-3 fatty acids are found in foods such as walnuts, flaxseeds, fortified milks, and eggs. These fats are also found in cold-water fish, such as sardines,  salmon, and mackerel. Limit how much you eat of: Canned or prepackaged foods. Food that is high in trans fat, such as some fried foods. Food that is high in saturated fat, such as fatty meat. Desserts and other sweets, sugary drinks, and other foods with added sugar. Full-fat dairy products. Do not salt foods before eating. Do not eat more than 4 egg yolks a week. Try to eat at least 2 vegetarian meals a week. Eat more home-cooked food and less restaurant, buffet, and fast food. Lifestyle When eating at a restaurant, ask that your food be prepared with less salt or no salt, if possible. If you drink alcohol: Limit how much you use to: 0-1 drink a day for women who are not pregnant. 0-2 drinks a day for men. Be aware of how much alcohol is in your drink. In the [place] one drink equals one 12 oz bottle of beer (355 mL), one 5 oz glass of wine (148 mL), or one 1 oz glass of hard liquor (44 mL). General information Avoid eating more than 2,300 mg of salt a day. If you have hypertension, you may need to reduce your sodium intake to 1,500 mg a day. Work with your health care provider to maintain a healthy body weight or to lose weight. Ask what an ideal weight is for you. Get at least 30 minutes of exercise that causes your heart to beat faster (aerobic exercise) most days of the week. Activities may include walking, swimming, or biking. Work with your health care provider or dietitian to adjust your eating plan to your individual calorie needs. What foods should I eat? Fruits All fresh, dried, or frozen fruit. Canned fruit in natural juice (without added sugar). Vegetables Fresh or frozen vegetables (raw, steamed, roasted, or grilled). Low-sodium or reduced-sodium tomato and vegetable juice. Low-sodium or reduced-sodium tomato sauce and tomato paste. Low-sodium or reduced-sodium canned vegetables. Grains Whole-grain or whole-wheat bread. Whole-grain or whole-wheat pasta. Brown rice. [place]. Bulgur. Whole-grain and low-sodium cereals. Pita bread. Low-fat, low-sodium crackers. Whole-wheat flour tortillas. Meats and other proteins Skinless chicken or [place]. Ground chicken or [place]. Pork with fat trimmed off. Fish and seafood. Egg whites. Dried beans, peas, or lentils. Unsalted nuts,  nut butters, and seeds. Unsalted canned beans. Lean cuts of beef with fat trimmed off. Low-sodium, lean precooked or cured meat, such as sausages or meat loaves. Dairy Low-fat (1%) or fat-free (skim) milk. Reduced-fat, low-fat, or fat-free cheeses. Nonfat, low-sodium ricotta or cottage cheese. Low-fat or nonfat yogurt. Low-fat, low-sodium cheese. Fats and oils Soft margarine without trans fats. Vegetable oil. Reduced-fat, low-fat, or light mayonnaise and salad dressings (reduced-sodium). Canola, safflower, olive, avocado, soybean, and sunflower oils. Avocado. Seasonings and condiments Herbs. Spices. Seasoning mixes without salt. Other foods Unsalted popcorn and pretzels. Fat-free sweets. The items listed above may not be a complete list of foods and beverages you can eat. Contact a dietitian for more information. What foods should I avoid? Fruits Canned fruit in a light or heavy syrup. Fried fruit. Fruit in cream or butter sauce. Vegetables Creamed or fried vegetables. Vegetables in a cheese sauce. Regular canned vegetables (not low-sodium or reduced-sodium). Regular canned tomato sauce and paste (not low-sodium or reduced-sodium). Regular tomato and vegetable juice (not low-sodium or reduced-sodium). Angie Fava. Olives. Grains Baked goods made with fat, such as croissants, muffins, or some breads. Dry pasta or rice meal packs. Meats and other proteins Fatty cuts of meat. Ribs. Fried meat. Berniece Salines. Bologna, salami, and other precooked or cured meats, such as sausages or meat loaves. Fat from the back of a pig (fatback). Bratwurst. Salted nuts and seeds. Canned beans with added salt. Canned or smoked  fish. Whole eggs or egg yolks. Chicken or [place] with skin. Dairy Whole or 2% milk, cream, and half-and-half. Whole or full-fat cream cheese. Whole-fat or sweetened yogurt. Full-fat cheese. Nondairy creamers. Whipped toppings. Processed cheese and cheese spreads. Fats and oils Butter. Stick margarine. Lard. Shortening. Ghee. Bacon fat. Tropical oils, such as coconut, palm kernel, or palm oil. Seasonings and condiments Onion salt, garlic salt, seasoned salt, table salt, and sea salt. Worcestershire sauce. Tartar sauce. Barbecue sauce. Teriyaki sauce. Soy sauce, including reduced-sodium. Steak sauce. Canned and packaged gravies. Fish sauce. Oyster sauce. Cocktail sauce. Store-bought horseradish. Ketchup. Mustard. Meat flavorings and tenderizers. Bouillon cubes. Hot sauces. Pre-made or packaged marinades. Pre-made or packaged taco seasonings. Relishes. Regular salad dressings. Other foods Salted popcorn and pretzels. The items listed above may not be a complete list of foods and beverages you should avoid. Contact a dietitian for more information. Where to find more information National Heart, Lung, and Blood Institute: https://wilson-eaton.com/ American Heart Association: www.heart.org Academy of Nutrition and Dietetics: www.eatright.[place]: www.kidney.org Summary The DASH eating plan is a healthy eating plan that has been shown to reduce high blood pressure (hypertension). It may also reduce your risk for type 2 diabetes, heart disease, and stroke. When on the DASH eating plan, aim to eat more fresh fruits and vegetables, whole grains, lean proteins, low-fat dairy, and heart-healthy fats. With the DASH eating plan, you should limit salt (sodium) intake to 2,300 mg a day. If you have hypertension, you may need to reduce your sodium intake to 1,500 mg a day. Work with your health care provider or dietitian to adjust your eating plan to your individual calorie needs. This information  is not intended to replace advice given to you by your health care provider. Make sure you discuss any questions you have with your health care provider. Document Revised: 04/16/2019 Document Reviewed: 04/16/2019 Elsevier Patient Education  2022 [place] Directive Advance directives are legal documents that allow you to make decisions about your health care and medical treatment in case you become  unable to communicate for yourself. Advance directives let your wishes be known to family, friends, and health care providers. Discussing and writing advance directives should happen over time rather than all at once. Advance directives can be changed and updated at any time. There are different types of advance directives, such as: Medical power of attorney. Living will. Do not resuscitate (DNR) order or do not attempt resuscitation (DNAR) order. Health care proxy and medical power of attorney A health care proxy is also called a health care agent. This person is appointed to make medical decisions for you when you are unable to make decisions for yourself. Generally, people ask a trusted friend or family member to act as their proxy and represent their preferences. Make sure you have an agreement with your trusted person to act as your proxy. A proxy may have to make a medical decision on your behalf if your wishes are not known. A medical power of attorney, also called a durable power of attorney for health care, is a legal document that names your health care proxy. Depending on the laws in your state, the document may need to be: Signed. Notarized. Dated. Copied. Witnessed. Incorporated into your medical record. You may also want to appoint a trusted person to manage your money in the event you are unable to do so. This is called a durable power of attorney for finances. It is a separate legal document from the durable power of attorney for health care. You may choose your health care  proxy or someone different to act as your agent in money matters. If you do not appoint a proxy, or there is a concern that the proxy is not acting in your best interest, a court may appoint a guardian to act on your behalf. Living will A living will is a set of instructions that state your wishes about medical care when you cannot express them yourself. Health care providers should keep a copy of your living will in your medical record. You may want to give a copy to family members or friends. To alert caregivers in case of an emergency, you can place a card in your wallet to let them know that you have a living will and where they can find it. A living will is used if you become: Terminally ill. Disabled. Unable to communicate or make decisions. The following decisions should be included in your living will: To use or not to use life support equipment, such as dialysis machines and breathing machines (ventilators). Whether you want a DNR or DNAR order. This tells health care providers not to use cardiopulmonary resuscitation (CPR) if breathing or heartbeat stops. To use or not to use tube feeding. To be given or not to be given food and fluids. Whether you want comfort (palliative) care when the goal becomes comfort rather than a cure. Whether you want to donate your organs and tissues. A living will does not give instructions for distributing your money and property if you should pass away. DNR or DNAR A DNR or DNAR order is a request not to have CPR in the event that your heart stops beating or you stop breathing. If a DNR or DNAR order has not been made and shared, a health care provider will try to help any patient whose heart has stopped or who has stopped breathing. If you plan to have surgery, talk with your health care provider about how your DNR or DNAR order will be followed if problems occur.  What if I do not have an advance directive? Some states assign family decision makers to act  on your behalf if you do not have an advance directive. Each state has its own laws about advance directives. You may want to check with your health care provider, attorney, or state representative about the laws in your state. Summary Advance directives are legal documents that allow you to make decisions about your health care and medical treatment in case you become unable to communicate for yourself. The process of discussing and writing advance directives should happen over time. You can change and update advance directives at any time. Advance directives may include a medical power of attorney, a living will, and a DNR or DNAR order. This information is not intended to replace advice given to you by your health care provider. Make sure you discuss any questions you have with your health care provider. Document Revised: 02/15/2020 Document Reviewed: 02/15/2020 Elsevier Patient Education  2022 [place].   Telephone follow up appointment with care management team member scheduled for:  Jacqlyn Larsen [facility], BSN RN Case Manager [facility] [phone]   CLINICAL CARE PLAN: Patient Care Plan: General Plan of Care (Hyperlipidemia)     Problem Identified: Health Promotion or Disease Self-Management (General Plan of Care)      Long-Range Goal: Hyperlipidemia Self-Management Plan Developed   Start Date: 02/08/2021  Expected End Date: 08/08/2021  This Visit's Progress: On track  Priority: Medium  Note:   Current Barriers:  Poorly controlled hyperlipidemia, complicated by diet, lack of exercise Current antihyperlipidemic regimen: none Most recent lipid panel:     Component Value Date/Time   CHOL 177 07/08/2019 1350   TRIG 70 07/08/2019 1350   HDL 57 07/08/2019 1350   CHOLHDL 3.1 07/08/2019 1350   VLDL 14 07/08/2019 1350   LDLCALC 106 (H) 07/08/2019 1350   LDLCALC 92 02/11/2017 1423  Patient reports she lives with spouse, is independent with ADL, IADL's, still drives  and works part time, walks "some" but has no formal exercise plan.  Pt requests advanced directives documents be mailed.   ASCVD risk enhancing conditions: age >37, HTN Does not adhere to provider recommendations re:  does not always adhere to heart healthy, low sodium diet, eats fast food at times. RN Care Manager Clinical Goal(s):  patient will work with Consulting civil engineer, providers, and care team towards execution of optimized self-health management plan patient will take all medications exactly as prescribed and will call provider for medication related questions patient will attend all scheduled medical appointments patient will demonstrate improved adherence to prescribed treatment plan for hyperlipidemia Interventions: Collaboration with Fayrene Helper, MD regarding development and update of comprehensive plan of care as evidenced by provider attestation and co-signature Inter-disciplinary care team collaboration (see longitudinal plan of care) Medication review performed; medication list updated in electronic medical record.  Inter-disciplinary care team collaboration (see longitudinal plan of care) Evaluation of current treatment plan related to hyperlipidemia and patient's adherence to plan as established by provider. Provided education to patient and/or caregiver about advanced directives- mailed packet and education to patient's home Reviewed medications with patient and discussed importance of taking as prescribed Provided patient with educational materials related to heart healthy diet Reviewed scheduled/upcoming provider appointments including: 10/7 primary care provider Discussed plans with patient for ongoing care management follow up and provided patient with direct contact information for care management team Discussed avoid trans/ saturated fats in diet, prepare foods baked or broiled instead of  fried Encouraged exercise daily Patient Goals/Self-Care Activities: -  complete a health care power of attorney and  living will - advanced directives packet mailed to you - call RN care manager if any questions - make shared treatment decisions with doctor - spend time outdoors at least 3 times a week  - avoid trans/saturated fat in your diet - bake or broil food instead of frying - call for medicine refill 2 or 3 days before it runs out - keep a list of all the medicines I take; vitamins and herbals too - change to whole grain breads, cereal, pasta - drink 6 to 8 glasses of water each day - eat 3 to 5 servings of fruits and vegetables each day - fill half the plate with nonstarchy vegetables - limit fast food meals to no more than 1 per week - manage portion size - read food labels for fat, fiber, carbohydrates and portion size - if I have chest pain, call for help - learn about small changes that will make a big difference Follow Up Plan: Telephone follow up appointment with care management team member scheduled for:  04/03/2021      Patient Care Plan: Hypertension (Adult)     Problem Identified: Hypertension (Hypertension)   Priority: Medium     Long-Range Goal: Hypertension Monitored   Start Date: 02/08/2021  Expected End Date: 08/08/2021  This Visit's Progress: On track  Priority: Medium  Note:   Objective:  Last practice recorded BP readings:  BP Readings from Last 3 Encounters:  10/11/20 137/72  07/11/20 (!) 163/61  01/19/20 123/78   Most recent eGFR/CrCl: No results found for: EGFR  No components found for: CRCL Current Barriers:  Knowledge Deficits related to basic understanding of hypertension pathophysiology and self care management- can benefit from reinforcement management of hypertension, diet, monitoring blood pressure.  Patient reports she has been trying to get portable oxygen for several months now and having difficulty getting the company to call her back (she is not exactly sure the company name, she found this company on  facebook). Pt uses oxygen at 2 liters at hs only with CPAP machine. Pt reports [facility] supplies both CPAP and oxygen, pt reports she called [facility], they no longer supply portable oxygen and she was given number of another company (not sure name of this company) and states they did not call her back either.  Patient reports issues with CPAP that does not always fit correctly. Does not adhere to provider recommendations re:  does not always eat low sodium, heart healthy diet Case Manager Clinical Goal(s):  patient will verbalize understanding of plan for hypertension management patient will attend all scheduled medical appointments: primary care provider 03/02/21 patient will demonstrate improved adherence to prescribed treatment plan for hypertension as evidenced by taking all medications as prescribed, monitoring and recording blood pressure as directed, adhering to low sodium/DASH diet Interventions:  Collaboration with Fayrene Helper, MD regarding development and update of comprehensive plan of care as evidenced by provider attestation and co-signature Inter-disciplinary care team collaboration (see longitudinal plan of care) Evaluation of current treatment plan related to hypertension self management and patient's adherence to plan as established by provider. Provided education to patient re: stroke prevention, s/s of heart attack and stroke, DASH diet, complications of uncontrolled blood pressure Reviewed medications with patient and discussed importance of compliance Discussed plans with patient for ongoing care management follow up and provided patient with direct contact information for care management team Advised  patient, providing education and rationale, to monitor blood pressure 3 x week  and record, calling PCP for findings outside established parameters.  Reviewed scheduled/upcoming provider appointments including:  primary care provider 03/02/2021 Reviewed importance of  following low sodium diet, reviewed foods high in sodium to avoid/ limit Mailed education - low sodium diet RN care manager instructed pt to call [employer] (as they are the DME supplier for 02 and CPAP) and request once again about portable oxygen and follow through on whomever they ask her to contact, also ask them to come out to her home to check the CPAP for any issues she is having, pt verbalizes understanding and states she is able to do this.  RN care manager sent in basket update to Dr. Moshe Cipro (primary care provider) with update on the DME/ portable oxygen, CPAP issues Self-Care Activities:  Attends all scheduled provider appointments Calls provider office for new concerns, questions, or BP outside discussed parameters Checks BP and records as discussed Follows a low sodium diet/DASH diet Patient Goals: - check blood pressure 3 times per week - choose a place to take my blood pressure (home, clinic or office, retail store) - write blood pressure results in a log or diary  - follow low salt diet- avoid / limit fast food and salty snacks - look over education mailed to you - low sodium diet - call your DME provider for oxygen/ CPAP if any questions or concerns ([place]) Follow Up Plan: Telephone follow up appointment with care management team member scheduled for:   04/03/2021

## 2021-02-09 DIAGNOSIS — Z96659 Presence of unspecified artificial knee joint: Secondary | ICD-10-CM | POA: Diagnosis not present

## 2021-02-09 DIAGNOSIS — E662 Morbid (severe) obesity with alveolar hypoventilation: Secondary | ICD-10-CM | POA: Diagnosis not present

## 2021-02-09 DIAGNOSIS — Z4789 Encounter for other orthopedic aftercare: Secondary | ICD-10-CM | POA: Diagnosis not present

## 2021-02-13 ENCOUNTER — Other Ambulatory Visit (HOSPITAL_COMMUNITY): Payer: Self-pay | Admitting: Family Medicine

## 2021-02-13 DIAGNOSIS — Z1231 Encounter for screening mammogram for malignant neoplasm of breast: Secondary | ICD-10-CM

## 2021-02-20 ENCOUNTER — Ambulatory Visit (INDEPENDENT_AMBULATORY_CARE_PROVIDER_SITE_OTHER): Payer: Medicare HMO | Admitting: Nurse Practitioner

## 2021-02-20 ENCOUNTER — Ambulatory Visit: Payer: Medicare HMO

## 2021-02-20 ENCOUNTER — Encounter: Payer: Self-pay | Admitting: Nurse Practitioner

## 2021-02-20 ENCOUNTER — Other Ambulatory Visit: Payer: Self-pay

## 2021-02-20 DIAGNOSIS — J069 Acute upper respiratory infection, unspecified: Secondary | ICD-10-CM | POA: Insufficient documentation

## 2021-02-20 MED ORDER — CORICIDIN HBP COLD/COUGH/FLU 20-400-650 MG/30ML PO LIQD: 30.0000 mL | Freq: Four times a day (QID) | ORAL | 0 refills | Status: DC | PRN

## 2021-02-20 MED ORDER — AMOXICILLIN-POT CLAVULANATE 875-125 MG PO TABS: 1.0000 | ORAL_TABLET | Freq: Two times a day (BID) | ORAL | 0 refills | Status: DC

## 2021-02-20 MED ORDER — PREDNISONE 20 MG PO TABS: 40.0000 mg | ORAL_TABLET | Freq: Every day | ORAL | 0 refills | Status: DC

## 2021-02-20 NOTE — Progress Notes (Signed)
Acute Office Visit  Subjective:    Patient ID: Patricia Fuentes, female    DOB: [birth year], 75 y.o.   MRN: [id number]  Chief Complaint  Patient presents with   Cough    Cough x 1 week congestion drainage oxygen levels down     Cough  Patient is in today for sick visit. She has taken theraflu and tylenol. She states she has been using albuterol for wheezing, and that has been helping.  She has not had a COVID test.   Past Medical History:  Diagnosis Date   Bronchitis    Chronic back pain    Essential hypertension    Hyperlipidemia    IGT (impaired glucose tolerance) 2015   Obesity, unspecified    Osteoarthritis    Seasonal allergies     Past Surgical History:  Procedure Laterality Date   CARDIAC CATHETERIZATION N/A 10/25/2014   Procedure: Left Heart Cath and Coronary Angiography;  Surgeon: Kathleene Hazel, MD;  Location: [facility] INVASIVE CV LAB;  Service: Cardiovascular;  Laterality: N/A;   COLONOSCOPY N/A 02/09/2016   Procedure: COLONOSCOPY;  Surgeon: West Bali, MD;  Location: [facility] ENDO SUITE;  Service: Endoscopy;  Laterality: N/A;  12:30 PM   JOINT REPLACEMENT N/A    Phreesia 07/27/2020   KNEE ARTHROSCOPY Left    TOTAL ABDOMINAL HYSTERECTOMY  1995 approx   Fibroids   TOTAL KNEE ARTHROPLASTY Right 2011   TOTAL KNEE ARTHROPLASTY Left 04/29/2017   Procedure: TOTAL KNEE ARTHROPLASTY;  Surgeon: Vickki Hearing, MD;  Location: [facility] ORS;  Service: Orthopedics;  Laterality: Left;    Family History  Problem Relation Age of Onset   Hypertension Mother    Stroke Mother 46       Deceased due CVa at 85   Diabetes Father    Cancer Father 78       Unknown, somewhere in stomach   Hypertension Brother    Hypertension Brother    Hypertension Brother    Cancer Maternal Grandfather        ?type   Hypertension Son     Social History   Socioeconomic History   Marital status: Married    Spouse name: Not on file   Number of children: Not on file   Years of  education: Not on file   Highest education level: Not on file  Occupational History   Occupation: retired    Associate Professor: UNEMPLOYED  Tobacco Use   Smoking status: Never   Smokeless tobacco: Never  Vaping Use   Vaping Use: Never used  Substance and Sexual Activity   Alcohol use: No   Drug use: No   Sexual activity: Yes    Birth control/protection: Surgical  Other Topics Concern   Not on file  Social History Narrative   Not on file   Social Determinants of Health   Financial Resource Strain: Low Risk    Difficulty of Paying Living Expenses: Not hard at all  Food Insecurity: No Food Insecurity   Worried About Programme researcher, broadcasting/film/video in the Last Year: Never true   Ran Out of Food in the Last Year: Never true  Transportation Needs: No Transportation Needs   Lack of Transportation (Medical): No   Lack of Transportation (Non-Medical): No  Physical Activity: Inactive   Days of Exercise per Week: 0 days   Minutes of Exercise per Session: 0 min  Stress: No Stress Concern Present   Feeling of Stress : Not at all  Social Connections: Socially  Integrated   Frequency of Communication with Friends and Family: More than three times a week   Frequency of Social Gatherings with Friends and Family: More than three times a week   Attends Religious Services: More than 4 times per year   Active Member of [employer] or Organizations: Yes   Attends Engineer, structural: More than 4 times per year   Marital Status: Married  Catering manager Violence: Not At Risk   Fear of Current or Ex-Partner: No   Emotionally Abused: No   Physically Abused: No   Sexually Abused: No    Outpatient Medications Prior to Visit  Medication Sig Dispense Refill   albuterol (VENTOLIN HFA) 108 (90 Base) MCG/ACT inhaler Inhale 1-2 puffs into the lungs every 6 (six) hours as needed for wheezing or shortness of breath. 1 each 2   amLODipine (NORVASC) 10 MG tablet TAKE 1 TABLET BY MOUTH ONCE A DAY. 90 tablet 0    aspirin EC 81 MG tablet Take 162 mg by mouth every morning.      calcium-vitamin D (OSCAL-500) 500-400 MG-UNIT tablet Take 1 tablet by mouth 2 (two) times daily. 60 tablet 5   ergocalciferol (VITAMIN D2) 1.25 MG (50000 UT) capsule Take 1 capsule (50,000 Units total) by mouth once a week. One capsule once weekly 12 capsule 2   hydrochlorothiazide (MICROZIDE) 12.5 MG capsule Take 1 capsule (12.5 mg total) by mouth daily. 90 capsule 1   OXYGEN Inhale 2 L into the lungs at bedtime as needed.     UNABLE TO FIND Oxygen conserving device DX J45.909. 1 each 0   No facility-administered medications prior to visit.    Allergies  Allergen Reactions   Other    Promethazine Hcl Nausea And Vomiting and Other (See Comments)    dizzy    Review of Systems  HENT:  Positive for congestion.   Respiratory:  Positive for cough.       Objective:    Physical Exam  There were no vitals taken for this visit. Wt Readings from Last 3 Encounters:  10/11/20 244 lb (110.7 kg)  07/11/20 248 lb (112.5 kg)  05/23/20 245 lb (111.1 kg)    Health Maintenance Due  Topic Date Due   Zoster Vaccines- Shingrix (1 of 2) Never done   COVID-19 Vaccine (4 - Booster for Moderna series) 08/11/2020   MAMMOGRAM  10/17/2020   INFLUENZA VACCINE  12/25/2020    There are no preventive care reminders to display for this patient.   Lab Results  Component Value Date   TSH 1.790 07/07/2020   Lab Results  Component Value Date   WBC 6.0 07/08/2019   HGB 11.9 (L) 07/08/2019   HCT 38.7 07/08/2019   MCV 91.5 07/08/2019   PLT 176 07/08/2019   Lab Results  Component Value Date   NA 141 07/07/2020   K 3.9 07/07/2020   CO2 28 07/07/2020   GLUCOSE 97 07/07/2020   BUN 10 07/07/2020   CREATININE 0.71 07/07/2020   BILITOT 0.5 07/08/2019   ALKPHOS 61 07/08/2019   AST 15 07/08/2019   ALT 12 07/08/2019   PROT 7.7 07/08/2019   ALBUMIN 3.6 07/08/2019   CALCIUM 9.1 07/07/2020   ANIONGAP 11 07/08/2019   Lab Results   Component Value Date   CHOL 177 07/08/2019   Lab Results  Component Value Date   HDL 57 07/08/2019   Lab Results  Component Value Date   LDLCALC 106 (H) 07/08/2019   Lab Results  Component Value Date   TRIG 70 07/08/2019   Lab Results  Component Value Date   CHOLHDL 3.1 07/08/2019   Lab Results  Component Value Date   HGBA1C 5.7 (H) 11/10/2018       Assessment & Plan:   Problem List Items Addressed This Visit       Respiratory   URI (upper respiratory infection) - Primary    -symptoms present x1 week -Rx. Augmentin, prednisone, and coricidin -will get COVID swab this afternoon -if no improvement in 1 week, would consider CXR -if symptoms get much worse, go to ED for further work-up/imaging      Relevant Medications   predniSONE (DELTASONE) 20 MG tablet   amoxicillin-clavulanate (AUGMENTIN) 875-125 MG tablet   Dextromethorphan-GG-APAP (CORICIDIN HBP COLD/COUGH/FLU) 10-200-325 MG/15ML LIQD   Other Relevant Orders   Novel Coronavirus, NAA ([facility])     Meds ordered this encounter  Medications   predniSONE (DELTASONE) 20 MG tablet    Sig: Take 2 tablets (40 mg total) by mouth daily with breakfast.    Dispense:  10 tablet    Refill:  0   amoxicillin-clavulanate (AUGMENTIN) 875-125 MG tablet    Sig: Take 1 tablet by mouth 2 (two) times daily.    Dispense:  14 tablet    Refill:  0   Dextromethorphan-GG-APAP (CORICIDIN HBP COLD/COUGH/FLU) 10-200-325 MG/15ML LIQD    Sig: Take 30 mLs by mouth every 6 (six) hours as needed.    Dispense:  355 mL    Refill:  0    Date:  02/20/2021   Location of Patient: Home Location of Provider: Office Consent was obtain for visit to be over via telehealth. I verified that I am speaking with the correct person using two identifiers.  I connected with  MERIAH SHANDS on 02/20/21 via telephone and verified that I am speaking with the correct person using two identifiers.   I discussed the limitations of evaluation and  management by telemedicine. The patient expressed understanding and agreed to proceed.  Time spent: 7 min     Heather Roberts, NP

## 2021-02-20 NOTE — Assessment & Plan Note (Addendum)
-  symptoms present x1 week -Rx. Augmentin, prednisone, and coricidin -will get COVID swab this afternoon -if no improvement in 1 week, would consider CXR -if symptoms get much worse, go to ED for further work-up/imaging

## 2021-02-21 ENCOUNTER — Ambulatory Visit (HOSPITAL_COMMUNITY): Payer: Medicare HMO

## 2021-02-22 ENCOUNTER — Telehealth: Payer: Self-pay

## 2021-02-22 LAB — SARS-COV-2, NAA 2 DAY TAT

## 2021-02-22 LAB — NOVEL CORONAVIRUS, NAA: SARS-CoV-2, NAA: NOT DETECTED

## 2021-02-22 NOTE — Telephone Encounter (Signed)
Patient calling about COVID lab results.

## 2021-02-22 NOTE — Progress Notes (Signed)
COVID negative.

## 2021-02-22 NOTE — Telephone Encounter (Signed)
Pt aware.

## 2021-02-23 DIAGNOSIS — E785 Hyperlipidemia, unspecified: Secondary | ICD-10-CM

## 2021-02-23 DIAGNOSIS — I10 Essential (primary) hypertension: Secondary | ICD-10-CM

## 2021-03-02 ENCOUNTER — Ambulatory Visit (INDEPENDENT_AMBULATORY_CARE_PROVIDER_SITE_OTHER): Payer: Medicare HMO | Admitting: Family Medicine

## 2021-03-02 ENCOUNTER — Other Ambulatory Visit: Payer: Self-pay

## 2021-03-02 ENCOUNTER — Encounter: Payer: Self-pay | Admitting: Family Medicine

## 2021-03-02 VITALS — BP 138/72 | HR 89 | Ht 62.0 in | Wt 242.1 lb

## 2021-03-02 DIAGNOSIS — R7303 Prediabetes: Secondary | ICD-10-CM

## 2021-03-02 DIAGNOSIS — Z6841 Body Mass Index (BMI) 40.0 and over, adult: Secondary | ICD-10-CM

## 2021-03-02 DIAGNOSIS — Z1211 Encounter for screening for malignant neoplasm of colon: Secondary | ICD-10-CM

## 2021-03-02 DIAGNOSIS — I10 Essential (primary) hypertension: Secondary | ICD-10-CM | POA: Diagnosis not present

## 2021-03-02 DIAGNOSIS — Z0000 Encounter for general adult medical examination without abnormal findings: Secondary | ICD-10-CM

## 2021-03-02 DIAGNOSIS — E785 Hyperlipidemia, unspecified: Secondary | ICD-10-CM

## 2021-03-02 DIAGNOSIS — Z23 Encounter for immunization: Secondary | ICD-10-CM

## 2021-03-02 NOTE — Patient Instructions (Addendum)
F/u in mid February, call if you need me before  Flu vaccine today  Please reschedule mammogram at checkout  Please get fasting lipid, cmp and eGR,  an HBa1C in next 1 week  Please get covid vaccine at your pharmacy   Please get shingrix vaccines at your pharmacyPlease submit stool for cologuard test  It is important that you exercise regularly at least 30 minutes 5 times a week. If you develop chest pain, have severe difficulty breathing, or feel very tired, stop exercising immediately and seek medical attention    Think about what you will eat, plan ahead. Choose " clean, green, fresh or frozen" over canned, processed or packaged foods which are more sugary, salty and fatty. 70 to 75% of food eaten should be vegetables and fruit. Three meals at set times with snacks allowed between meals, but they must be fruit or vegetables. Aim to eat over a 12 hour period , example 7 am to 7 pm, and STOP after  your last meal of the day. Drink water,generally about 64 ounces per day, no other drink is as healthy. Fruit juice is best enjoyed in a healthy way, by EATING the fruit. Thanks for choosing [facility], we consider it a privelige to serve you.

## 2021-03-02 NOTE — Progress Notes (Signed)
    Patricia Fuentes     MRN: [id number]      DOB: 1945/08/21  HPI: Patient is in for annual physical exam. No other health concerns are expressed or addressed at the visit. Recent labs,  are reviewed. Immunization is reviewed , and  updated   PE: BP 138/72   Pulse 89   Ht 5\' 2"  (1.575 m)   Wt 242 lb 1.9 oz (109.8 kg)   SpO2 91%   BMI 44.28 kg/m   Pleasant  female, alert and oriented x 3, in no cardio-pulmonary distress. Afebrile. HEENT No facial trauma or asymetry. Sinuses non tender.  Extra occullar muscles intact.. External ears normal, . Neck: supple, no adenopathy,JVD or thyromegaly.No bruits.  Chest: Clear to ascultation bilaterally.No crackles or wheezes. Non tender to palpation    Cardiovascular system; Heart sounds normal,  S1 and  S2 ,no S3.  No murmur, or thrill. Apical beat not displaced Peripheral pulses normal.  Abdomen: Soft, non tender, Bowel sounds normal. No guarding, tenderness or rebound.       Musculoskeletal exam: Decreased though adequate  ROM of spine, hips , shoulders and knees.  deformity ,swelling and  crepitus noted. No muscle wasting or atrophy.   Neurologic: Cranial nerves 2 to 12 intact. Power, tone ,sensation and reflexes normal throughout. No disturbance in gait. No tremor.  Skin: Intact, no ulceration, erythema , scaling or rash noted. Pigmentation normal throughout  Psych; Normal mood and affect. Judgement and concentration normal   Assessment & Plan:  Annual physical exam Annual exam as documented. Counseling done  re healthy lifestyle involving commitment to 150 minutes exercise per week, heart healthy diet, and attaining healthy weight.The importance of adequate sleep also discussed. Regular seat belt use and home safety, is also discussed. Changes in health habits are decided on by the patient with goals and time frames  set for achieving them. Immunization and cancer screening needs are specifically  addressed at this visit.   Class 3 severe obesity due to excess calories with serious comorbidity and body mass index (BMI) of 40.0 to 44.9 in adult [facility])  Patient re-educated about  the importance of commitment to a  minimum of 150 minutes of exercise per week as able.  The importance of healthy food choices with portion control discussed, as well as eating regularly and within a 12 hour window most days. The need to choose "clean , green" food 50 to 75% of the time is discussed, as well as to make water the primary drink and set a goal of 64 ounces water daily.    Weight /BMI 03/02/2021 10/11/2020 09/06/2020  WEIGHT 242 lb 1.9 oz 244 lb -  HEIGHT 5\' 2"  5\' 2"  -  BMI 44.28 kg/m2 44.63 kg/m2 -

## 2021-03-02 NOTE — Assessment & Plan Note (Signed)
  Patient re-educated about  the importance of commitment to a  minimum of 150 minutes of exercise per week as able.  The importance of healthy food choices with portion control discussed, as well as eating regularly and within a 12 hour window most days. The need to choose "clean , green" food 50 to 75% of the time is discussed, as well as to make water the primary drink and set a goal of 64 ounces water daily.    Weight /BMI 03/02/2021 10/11/2020 09/06/2020  WEIGHT 242 lb 1.9 oz 244 lb -  HEIGHT 5\' 2"  5\' 2"  -  BMI 44.28 kg/m2 44.63 kg/m2 -

## 2021-03-02 NOTE — Assessment & Plan Note (Signed)

## 2021-03-08 ENCOUNTER — Ambulatory Visit (HOSPITAL_COMMUNITY): Payer: Medicare HMO

## 2021-03-11 DIAGNOSIS — Z4789 Encounter for other orthopedic aftercare: Secondary | ICD-10-CM | POA: Diagnosis not present

## 2021-03-11 DIAGNOSIS — E662 Morbid (severe) obesity with alveolar hypoventilation: Secondary | ICD-10-CM | POA: Diagnosis not present

## 2021-03-11 DIAGNOSIS — Z96659 Presence of unspecified artificial knee joint: Secondary | ICD-10-CM | POA: Diagnosis not present

## 2021-04-03 ENCOUNTER — Telehealth: Payer: Self-pay | Admitting: *Deleted

## 2021-04-03 ENCOUNTER — Telehealth: Payer: Medicare HMO

## 2021-04-03 NOTE — Chronic Care Management (AMB) (Signed)
  Care Management   Note  04/03/2021 Name: [name] MRN: [id number] DOB: 1946-05-02  Patricia Fuentes is a 75 y.o. year old female who is a primary care patient of Kerri Perches, MD and is actively engaged with the care management team. I reached out to [name] by phone today to assist with re-scheduling a follow up visit with the RN Case Manager  Follow up plan: Unsuccessful telephone outreach attempt made. A HIPAA compliant phone message was left for the patient providing contact information and requesting a return call. The care management team will reach out to the patient again over the next 7 days.  If patient returns call to provider office, please advise to call Embedded Care Management Care Guide Misty Stanley at [phone].  [name]  Care Guide, Embedded Care Coordination [facility] Management  Direct Dial: [phone]

## 2021-04-09 ENCOUNTER — Telehealth: Payer: Self-pay | Admitting: Family Medicine

## 2021-04-09 NOTE — Telephone Encounter (Signed)
Called pt to advise to have blood work completed 1 week prior to the Appointment.  And to be fasting nothing to eat or drink that AM

## 2021-04-09 NOTE — Chronic Care Management (AMB) (Signed)
  Care Management   Note  04/09/2021 Name: Patricia Fuentes MRN: [id number] DOB: 06/08/1945  Patricia Fuentes is a 75 y.o. year old female who is a primary care patient of Kerri Perches, MD and is actively engaged with the care management team. I reached out to [name] by phone today to assist with re-scheduling a follow up visit with the RN Case Manager  Follow up plan: Telephone appointment with care management team member scheduled for:05/10/21  [facility] Guide, Embedded Care Coordination [facility]  Care Management  Direct Dial: [phone]

## 2021-04-16 ENCOUNTER — Ambulatory Visit (HOSPITAL_COMMUNITY): Payer: Medicare HMO

## 2021-04-24 DIAGNOSIS — Z1211 Encounter for screening for malignant neoplasm of colon: Secondary | ICD-10-CM | POA: Diagnosis not present

## 2021-04-26 ENCOUNTER — Ambulatory Visit (HOSPITAL_COMMUNITY)
Admission: RE | Admit: 2021-04-26 | Discharge: 2021-04-26 | Disposition: A | Discharge status: Home / Self Care | Payer: Medicare HMO | Source: Ambulatory Visit | Attending: Family Medicine | Admitting: Family Medicine

## 2021-04-26 ENCOUNTER — Other Ambulatory Visit: Payer: Self-pay

## 2021-04-26 DIAGNOSIS — Z1231 Encounter for screening mammogram for malignant neoplasm of breast: Secondary | ICD-10-CM | POA: Insufficient documentation

## 2021-04-29 LAB — COLOGUARD: COLOGUARD: NEGATIVE

## 2021-05-10 ENCOUNTER — Telehealth: Payer: Medicare HMO

## 2021-05-10 ENCOUNTER — Telehealth: Payer: Self-pay | Admitting: *Deleted

## 2021-05-10 NOTE — Telephone Encounter (Signed)
°  Care Management   Follow Up Note   05/10/2021 Name: Patricia Fuentes MRN: [id number] DOB: 1945/10/11   Referred by: Kerri Perches, MD Reason for referral : Chronic Care Management (HTN, HLD)   An unsuccessful telephone outreach was attempted today. The patient was referred to the case management team for assistance with care management and care coordination.   Follow Up Plan: Telephone follow up appointment with care management team member scheduled for:  upon care guide rescheduling.  Irving Shows [facility], BSN RN Case Manager [facility] [phone]

## 2021-05-31 ENCOUNTER — Ambulatory Visit (INDEPENDENT_AMBULATORY_CARE_PROVIDER_SITE_OTHER): Payer: Medicare HMO | Admitting: *Deleted

## 2021-05-31 DIAGNOSIS — I10 Essential (primary) hypertension: Secondary | ICD-10-CM

## 2021-05-31 DIAGNOSIS — E785 Hyperlipidemia, unspecified: Secondary | ICD-10-CM

## 2021-05-31 NOTE — Chronic Care Management (AMB) (Signed)
Chronic Care Management   CCM RN Visit Note  05/31/2021 Name: Patricia Fuentes MRN: [id number] DOB: 1946-01-11  Subjective: [name] is a 76 y.o. year old female who is a primary care patient of Fayrene Helper, MD. The care management team was consulted for assistance with disease management and care coordination needs.    Engaged with patient by telephone for follow up visit in response to provider referral for case management and/or care coordination services.   Consent to Services:  The patient was given information about Chronic Care Management services, agreed to services, and gave verbal consent prior to initiation of services.  Please see initial visit note for detailed documentation.   Patient agreed to services and verbal consent obtained.   Assessment: Review of patient past medical history, allergies, medications, health status, including review of consultants reports, laboratory and other test data, was performed as part of comprehensive evaluation and provision of chronic care management services.   SDOH (Social Determinants of Health) assessments and interventions performed:    CCM Care Plan  Allergies  Allergen Reactions   Other    Promethazine Hcl Nausea And Vomiting and Other (See Comments)    dizzy    Outpatient Encounter Medications as of 05/31/2021  Medication Sig   amLODipine (NORVASC) 10 MG tablet TAKE 1 TABLET BY MOUTH ONCE A DAY.   calcium-vitamin D (OSCAL-500) 500-400 MG-UNIT tablet Take 1 tablet by mouth 2 (two) times daily.   hydrochlorothiazide (MICROZIDE) 12.5 MG capsule Take 1 capsule (12.5 mg total) by mouth daily.   OXYGEN Inhale 2 L into the lungs at bedtime as needed.   UNABLE TO FIND Oxygen conserving device DX J45.909. (Patient not taking: Reported on 05/31/2021)   No facility-administered encounter medications on file as of 05/31/2021.    Patient Active Problem List   Diagnosis Date Noted   Class 3 severe obesity due to excess  calories with serious comorbidity and body mass index (BMI) of 40.0 to 44.9 in adult ([place]) 12/15/2018   Supplemental oxygen dependent 12/15/2018   Sleep-related hypoxia 12/15/2018   Educated about COVID-19 virus infection 10/22/2018   Obesity with alveolar hypoventilation and body mass index (BMI) of 40 or greater ([place]) 09/17/2018   Insomnia due to medical condition 09/17/2018   Hypersomnia with sleep apnea 09/17/2018   History of orthopnea 09/17/2018   OSA on CPAP    Reactive airway disease    S/P total knee arthroplasty, left 04/29/17 05/06/2017   Anemia 05/06/2017   Primary osteoarthritis of both knees    Abnormal CT scan, colon 01/18/2016   Annual physical exam 01/02/2016   DOE (dyspnea on exertion)    Abnormal myocardial perfusion study    Fatigue 07/18/2014   Vitamin D deficiency 07/18/2014   Exercise intolerance 07/18/2014   Sleep disorder 07/18/2014   Nonspecific abnormal electrocardiogram (ECG) (EKG) 07/18/2014   Reduced vision 12/27/2013   Radicular pain of thoracic region 06/22/2013   Prediabetes 49/70/2637   Metabolic syndrome X 85/88/5027   BACK PAIN WITH RADICULOPATHY 09/20/2008   Hyperlipidemia LDL goal <100 03/29/2008   Obesity hypoventilation syndrome ([place]) 03/29/2008   Essential hypertension 10/07/2007    Conditions to be addressed/monitored:HTN and HLD  Care Plan : RN Care Manager Plan of Care  Updates made by Kassie Mends, RN since 05/31/2021 12:00 AM     Problem: No plan of care established for management of chronic disease states  (HTN, HLD)   Priority: High     Long-Range Goal:  Development of plan of care for chronic disease management  (HTN, HLD)   Start Date: 05/31/2021  Expected End Date: 11/27/2021  Priority: High  Note:   Current Barriers:  Knowledge Deficits related to plan of care for management of HTN and HLD  Patient reports she lives with spouse, is independent with ADL, IADL's, still drives and works part time, walks "some" but has no  formal exercise plan.  Pt requests advanced directives documents be mailed as she did not receive what was previously mailed. ASCVD risk enhancing conditions: age >21, HTN Does not adhere to provider recommendations re:  does not always adhere to heart healthy, low sodium diet, eats fast food at times. Knowledge Deficits related to basic understanding of hypertension pathophysiology and self care management- can benefit from reinforcement management of hypertension, diet, monitoring blood pressure, pt checks blood pressure 2 x week and states " readings usually good" Patient reports she has been trying to get portable oxygen for several months now and having difficulty getting the company to call her back (she is not exactly sure the company name, she found this company on facebook). Pt uses oxygen at 2 liters at hs only with CPAP machine. Pt reports [facility] supplies both CPAP and oxygen, pt reports she called [facility], they no longer supply portable oxygen and she was given number of another company and pt plans to call them back. Patient reports issues with CPAP that mask comes off at night when pt changes positions. Pt reports she will call [place] about the issue with the mask.  RNCM Clinical Goal(s):  Patient will verbalize understanding of plan for management of HTN and HLD as evidenced by patient report, review of EHR and  through collaboration with RN Care manager, provider, and care team.   Interventions: 1:1 collaboration with primary care provider regarding development and update of comprehensive plan of care as evidenced by provider attestation and co-signature Inter-disciplinary care team collaboration (see longitudinal plan of care) Evaluation of current treatment plan related to  self management and patient's adherence to plan as established by provider  Hyperlipidemia:  (Status: Goal on Track (progressing): YES.) Long Term Goal  Lab Results  Component Value Date    CHOL 177 07/08/2019   HDL 57 07/08/2019   LDLCALC 106 (H) 07/08/2019   TRIG 70 07/08/2019   CHOLHDL 3.1 07/08/2019     Medication review performed; medication list updated in electronic medical record.  Counseled on importance of regular laboratory monitoring as prescribed; Reviewed importance of limiting foods high in cholesterol; Reviewed exercise goals and target of 150 minutes per week; Pain assessment completed Reinforced low sodium diet  Hypertension Interventions:  (Status:  Goal on track:  Yes.) Long Term Goal Last practice recorded BP readings:  BP Readings from Last 3 Encounters:  03/02/21 138/72  10/11/20 137/72  07/11/20 (!) 163/61  Most recent eGFR/CrCl: No results found for: EGFR  No components found for: CRCL  Provided education to patient re: stroke prevention, s/s of heart attack and stroke Reviewed medications with patient and discussed importance of compliance Advised patient, providing education and rationale, to monitor blood pressure daily and record, calling PCP for findings outside established parameters  Directed pt to call [place] about issue with CPAP mask not staying on while sleeping Mailed advanced directives packet (pt states she did not get previously)  Patient Goals/Self-Care Activities: Take medications as prescribed   Attend all scheduled provider appointments Perform IADL's (shopping, preparing meals, housekeeping,  managing finances) independently Call provider office for new concerns or questions  check blood pressure 3 times per week write blood pressure results in a log or diary take blood pressure log to all doctor appointments keep all doctor appointments take medications for blood pressure exactly as prescribed eat more whole grains, fruits and vegetables, lean meats and healthy fats - call for medicine refill 2 or 3 days before it runs out - call doctor when you experience any new symptoms - go to all doctor  appointments as scheduled - adhere to prescribed diet: heart healthy Advanced directives packet re-mailed, please look over and complete Call [place] about the CPAP mask not staying on at night Call DME company about portable oxygen        Plan:Telephone follow up appointment with care management team member scheduled for:  08/21/2021  Jacqlyn Larsen [facility], BSN RN Case Manager [facility] [phone]

## 2021-05-31 NOTE — Patient Instructions (Addendum)
Visit Information  Thank you for taking time to visit with me today. Please don't hesitate to contact me if I can be of assistance to you before our next scheduled telephone appointment.  Following are the goals we discussed today:  Take medications as prescribed   Attend all scheduled provider appointments Perform IADL's (shopping, preparing meals, housekeeping, managing finances) independently Call provider office for new concerns or questions  check blood pressure 3 times per week write blood pressure results in a log or diary take blood pressure log to all doctor appointments keep all doctor appointments take medications for blood pressure exactly as prescribed eat more whole grains, fruits and vegetables, lean meats and healthy fats call for medicine refill 2 or 3 days before it runs out call doctor when you experience any new symptoms go to all doctor appointments as scheduled adhere to prescribed diet: heart healthy Advanced directives packet re-mailed, please look over and complete Call [place] about the CPAP mask not staying on at night Call DME company about portable oxygen Home Oxygen Use, Adult When a medical condition keeps you from getting enough oxygen, your health care provider may instruct you to take extra oxygen at home. Your health care provider will let you know: When to take oxygen. How long to take oxygen. How quickly oxygen should be delivered (flow rate), in liters per minute (LPM or L/M). Home oxygen can be given through: A mask. A nasal cannula. This is a device or tube that goes in the nostrils. A transtracheal catheter. This is a small, thin tube placed in the windpipe (trachea). A breathing tube (tracheostomy tube) that is surgically placed in the windpipe. This may be used in severe cases. These devices are connected with tubing to an oxygen source, such as: A tank. Tanks hold oxygen in gas form. They must be replaced when the oxygen is used  up. A liquid oxygen device. This holds oxygen in liquid form. Liquid oxygen is very cold. It must be replaced when the oxygen is used up. An oxygen concentrator machine. This filters oxygen in the room. There are two types of oxygen concentrator machines--stationary and portable. A stationary oxygen concentrator machine plugs into the main electricity supply at your home. You must have a backup cylinder of oxygen in case the power goes out. A portable oxygen concentrator machine is smaller in size and more lightweight. This machine uses battery supply and can be used outside the home. Work with your health care provider to find equipment that works best for you and your lifestyle. What are the risks? Delivery of supplemental oxygen is generally safe. However, some risks include: Fire. This can happen if the oxygen is exposed to a heat source, flame, or spark. Injury to skin. This can happen if liquid oxygen touches your skin. Damage to the lungs or other organs. This can happen from getting too little or too much oxygen. Supplies needed: To use oxygen, you will need: A mask, nasal cannula, transtracheal catheter, or tracheostomy. An oxygen tank, a liquid oxygen device, or an oxygen concentrator. The tape that your health care provider recommends (optional). Your health care provider may also recommend: A humidifier to warm and moisten the oxygen delivered. This will depend on how much oxygen you need and the type of home oxygen device you use. A pulse oximeter. This device measures the percentage of oxygen in your blood. How to use oxygen Your health care provider or a person from your medical device company will  show you how to use your oxygen device. Follow his or her instructions. The instructions may look something like this: Wash your hands with soap and water. If you use an oxygen concentrator, make sure it is plugged in. Place one end of the tube into the port on the tank, device, or  machine. Place the mask over your nose and mouth. Or, place the nasal cannula and secure it with tape if instructed. If you use a tracheostomy or transtracheal catheter, connect it to the oxygen source as directed. Make sure the liter-flow setting on the machine is at the level prescribed by your health care provider. Turn on the machine or adjust the knob on the tank or device to the correct liter-flow setting. When you are done, turn off and unplug the machine, or turn the knob to OFF. How to clean and care for the oxygen supplies Nasal cannula Clean it with a warm, wet cloth daily or as needed. Wash it with a liquid soap once a week. Rinse it thoroughly once or twice a week. Air-dry it. Replace it every 2-4 weeks. If you have an infection, such as a cold or pneumonia, change the cannula when you get better. Mask Replace it every 2-4 weeks. If you have an infection, such as a cold or pneumonia, change the mask when you get better. Humidifier bottle Wash the bottle between each refill: Wash it with soap and warm water. Rinse it thoroughly. Clean it and its top with a disinfectant cleaner. Air-dry it. Make sure it is dry before you refill it. Oxygen concentrator Clean the air filter at least twice a week according to directions from your home medical equipment and service company. Wipe down the cabinet every day. To do this: Unplug the unit. Wipe down the cabinet with a damp cloth. Dry the cabinet. Other equipment Change any extra tubing every 1-3 months. Follow instructions from your health care provider about taking care of any other equipment. Safety tips Fire safety tips  Keep your oxygen and oxygen supplies at least 6 ft (2 m) away from sources of heat, flames, and sparks at all times. Do not allow smoking near your oxygen. Put up "no smoking" signs in your home. Avoid smoking areas when in public. Do not use materials that can burn (are flammable) while you use oxygen. This  includes: Petroleum jelly. Hair spray or other aerosol sprays. Rubbing alcohol. Hand sanitizer. When you go to a restaurant with portable oxygen, ask to be seated in the non-smoking section. Keep a Data processing manager close by. Let your fire department know that you have oxygen in your home. Test your home smoke detectors regularly. Traveling Secure your oxygen tank in the vehicle so that it does not move around. Follow instructions from your medical device company about how to safely secure your tank. Make sure you have enough oxygen for the amount of time you will be away from home. If you are planning to travel by public transportation (airplane, train, bus, or boat), contact the company to find out if it allows the use of an approved portable oxygen concentrator. You may also need documents from your health care provider and medical device company before you travel. General safety tips If you use an oxygen cylinder, make sure it is in a stand or secured to an object that will not move (fixed object). If you use liquid oxygen, make sure its container is kept upright at all times. If you use an oxygen concentrator: Tell your  electric company. Make sure you are given priority service in the event that your power goes out. Avoid using extension cords if possible. Follow these instructions at home: Use oxygen only as told by your health care provider. Do not use alcohol or other drugs that make you relax (sedating drugs) unless instructed. They can slow down your breathing rate and make it hard to get in enough oxygen. Know how and when to order a refill of oxygen. Always keep a spare tank of oxygen. Plan ahead for holidays when you may not be able to get a prescription filled. Use water-based lubricants on your lips or nostrils. Do not use oil-based products like petroleum jelly. To prevent skin irritation on your cheeks or behind your ears, tuck some gauze under the tubing. Where to find more  information American Lung Association: DiabeticMale.de Contact a health care provider if: You get headaches often. You have a lasting cough. You are restless or have anxiety. You develop an illness that affects your breathing. You cannot exercise at your regular level. You have a fever. You have persistent redness under your nose. Get help right away if: You are confused. You are sleepy all the time. You have blue lips or fingernails. You have difficult or irregular breathing that is getting worse. You are struggling to breathe. These symptoms may represent a serious problem that is an emergency. Do not wait to see if the symptoms will go away. Get medical help right away. Call your local emergency services (911 in the [place] Do not drive yourself to the hospital. Summary Your health care provider or a person from your [place] will show you how to use your oxygen device. Follow his or her instructions. If you use an oxygen concentrator, make sure it is plugged in. Make sure the liter-flow setting on the machine is at the level prescribed by your health care provider. Use oxygen only as told by your health care provider. Keep your oxygen and oxygen supplies at least 6 ft (2 m) away from sources of heat, flames, and sparks at all times. This information is not intended to replace advice given to you by your health care provider. Make sure you discuss any questions you have with your health care provider. Document Revised: 07/15/2019 Document Reviewed: 05/11/2019 Elsevier Patient Education  2022 [place] next appointment is by telephone on 08/21/2021 at 3 pm  Please call the care guide team at [phone] if you need to cancel or reschedule your appointment.   If you are experiencing a Mental Health or [place] or need someone to talk to, please call the Suicide and Crisis Lifeline: 988 call the [place] National Suicide Prevention Lifeline:  825-650-0299 or TTY: (386)585-6185 TTY 406-827-7039) to talk to a trained counselor call 1-800-273-TALK (toll free, 24 hour hotline) go to [facility] [address] [place] [id number]) call the [place]: [phone] call 911   The patient verbalized understanding of instructions, educational materials, and care plan provided today and agreed to receive a mailed copy of patient instructions, educational materials, and care plan.   Jacqlyn Larsen [facility], BSN RN Case Manager [facility] [phone]

## 2021-06-11 ENCOUNTER — Other Ambulatory Visit: Payer: Self-pay | Admitting: Family Medicine

## 2021-06-26 DIAGNOSIS — I10 Essential (primary) hypertension: Secondary | ICD-10-CM

## 2021-06-26 DIAGNOSIS — E785 Hyperlipidemia, unspecified: Secondary | ICD-10-CM | POA: Diagnosis not present

## 2021-07-05 DIAGNOSIS — I10 Essential (primary) hypertension: Secondary | ICD-10-CM | POA: Diagnosis not present

## 2021-07-05 DIAGNOSIS — R7303 Prediabetes: Secondary | ICD-10-CM | POA: Diagnosis not present

## 2021-07-05 DIAGNOSIS — E785 Hyperlipidemia, unspecified: Secondary | ICD-10-CM | POA: Diagnosis not present

## 2021-07-06 LAB — LIPID PANEL
Chol/HDL Ratio: 3.2 ratio (ref 0.0–4.4)
Cholesterol, Total: 188 mg/dL (ref 100–199)
HDL: 59 mg/dL (ref >39)
LDL Chol Calc (NIH): 116 mg/dL — ABNORMAL HIGH (ref 0–99)
Triglycerides: 72 mg/dL (ref 0–149)
VLDL Cholesterol Cal: 13 mg/dL (ref 5–40)

## 2021-07-06 LAB — CMP14+EGFR
ALT: 7 IU/L (ref 0–32)
AST: 14 IU/L (ref 0–40)
Albumin/Globulin Ratio: 1.1 — ABNORMAL LOW (ref 1.2–2.2)
Albumin: 4.1 g/dL (ref 3.7–4.7)
Alkaline Phosphatase: 79 IU/L (ref 44–121)
BUN/Creatinine Ratio: 15 (ref 12–28)
BUN: 11 mg/dL (ref 8–27)
Bilirubin Total: 0.3 mg/dL (ref 0.0–1.2)
CO2: 28 mmol/L (ref 20–29)
Calcium: 9.2 mg/dL (ref 8.7–10.3)
Chloride: 100 mmol/L (ref 96–106)
Creatinine, Ser: 0.73 mg/dL (ref 0.57–1.00)
Globulin, Total: 3.6 g/dL (ref 1.5–4.5)
Glucose: 103 mg/dL — ABNORMAL HIGH (ref 70–99)
Potassium: 4.3 mmol/L (ref 3.5–5.2)
Sodium: 140 mmol/L (ref 134–144)
Total Protein: 7.7 g/dL (ref 6.0–8.5)
eGFR: 87 mL/min/{1.73_m2} (ref >59)

## 2021-07-06 LAB — HEMOGLOBIN A1C
Est. average glucose Bld gHb Est-mCnc: 126 mg/dL
Hgb A1c MFr Bld: 6 % — ABNORMAL HIGH (ref 4.8–5.6)

## 2021-07-11 ENCOUNTER — Ambulatory Visit (HOSPITAL_COMMUNITY)
Admission: RE | Admit: 2021-07-11 | Discharge: 2021-07-11 | Disposition: A | Discharge status: Home / Self Care | Payer: Medicare HMO | Source: Ambulatory Visit | Attending: Family Medicine | Admitting: Family Medicine

## 2021-07-11 ENCOUNTER — Ambulatory Visit (INDEPENDENT_AMBULATORY_CARE_PROVIDER_SITE_OTHER): Payer: Medicare HMO | Admitting: Family Medicine

## 2021-07-11 ENCOUNTER — Encounter (INDEPENDENT_AMBULATORY_CARE_PROVIDER_SITE_OTHER): Payer: Self-pay

## 2021-07-11 ENCOUNTER — Encounter: Payer: Self-pay | Admitting: Family Medicine

## 2021-07-11 ENCOUNTER — Other Ambulatory Visit: Payer: Self-pay

## 2021-07-11 VITALS — BP 122/63 | HR 73 | Resp 16 | Ht 62.0 in | Wt 236.0 lb

## 2021-07-11 DIAGNOSIS — M25511 Pain in right shoulder: Secondary | ICD-10-CM

## 2021-07-11 DIAGNOSIS — M4802 Spinal stenosis, cervical region: Secondary | ICD-10-CM | POA: Diagnosis not present

## 2021-07-11 DIAGNOSIS — M542 Cervicalgia: Secondary | ICD-10-CM | POA: Insufficient documentation

## 2021-07-11 DIAGNOSIS — G8929 Other chronic pain: Secondary | ICD-10-CM

## 2021-07-11 DIAGNOSIS — R7303 Prediabetes: Secondary | ICD-10-CM

## 2021-07-11 DIAGNOSIS — Z6841 Body Mass Index (BMI) 40.0 and over, adult: Secondary | ICD-10-CM

## 2021-07-11 DIAGNOSIS — E559 Vitamin D deficiency, unspecified: Secondary | ICD-10-CM | POA: Diagnosis not present

## 2021-07-11 DIAGNOSIS — M79641 Pain in right hand: Secondary | ICD-10-CM | POA: Insufficient documentation

## 2021-07-11 DIAGNOSIS — E785 Hyperlipidemia, unspecified: Secondary | ICD-10-CM

## 2021-07-11 DIAGNOSIS — R6889 Other general symptoms and signs: Secondary | ICD-10-CM

## 2021-07-11 DIAGNOSIS — I10 Essential (primary) hypertension: Secondary | ICD-10-CM

## 2021-07-11 DIAGNOSIS — R058 Other specified cough: Secondary | ICD-10-CM

## 2021-07-11 DIAGNOSIS — M50322 Other cervical disc degeneration at C5-C6 level: Secondary | ICD-10-CM | POA: Diagnosis not present

## 2021-07-11 MED ORDER — PREDNISONE 5 MG PO TABS: 5.0000 mg | ORAL_TABLET | Freq: Two times a day (BID) | ORAL | 0 refills | Status: AC

## 2021-07-11 MED ORDER — METFORMIN HCL 500 MG PO TABS: 500.0000 mg | ORAL_TABLET | Freq: Every day | ORAL | 5 refills | Status: DC

## 2021-07-11 MED ORDER — MONTELUKAST SODIUM 10 MG PO TABS: 10.0000 mg | ORAL_TABLET | Freq: Every day | ORAL | 3 refills | Status: DC

## 2021-07-11 NOTE — Progress Notes (Signed)
NONI STONESIFER     MRN: [id number]      DOB: 01/20/1946   HPI Ms. Zaugg is here for follow up and re-evaluation of chronic medical conditions, medication management and review of any available recent lab and radiology data.  Preventive health is updated, specifically  Cancer screening and Immunization.   Questions or concerns regarding consultations or procedures which the PT has had in the interim are  addressed. The PT denies any adverse reactions to current medications since the last visit.  1 month h/o new rigth neck, shoulder and arm to hand pain goes up to a 9, pain sometimes awakens her Tenderness, soreness and locking of right middle finger at times, especially in the mornings Was around smoker yesterday and  has started  coughing notes increased post nasal drainage and sputum is clear  ROS Denies recent fever or chills.  Denies chest pains, palpitations and leg swelling Denies abdominal pain, nausea, vomiting,diarrhea or constipation.   Denies dysuria, frequency, hesitancy or incontinence. Denies headaches, seizures, numbness, or tingling. Denies depression, anxiety or insomnia. Denies skin break down or rash.   PE  BP 122/63    Pulse 73    Resp 16    Ht 5\' 2"  (1.575 m)    Wt 236 lb (107 kg)    SpO2 94%    BMI 43.16 kg/m   Patient alert and oriented and in no cardiopulmonary distress.  HEENT: No facial asymmetry, EOMI,     Neck decreased ROM Chest: Clear to auscultation bilaterally.  CVS: S1, S2 no murmurs, no S3.Regular rate.  ABD: Soft non tender.   Ext: No edema  MS: decreased  ROM spine, shoulders, hips and knees.  Skin: Intact, no ulcerations or rash noted.  Psych: Good eye contact, normal affect. Memory intact not anxious or depressed appearing.  CNS: CN 2-12 intact, power,  normal throughout.no focal deficits noted.   Assessment & Plan  Exercise intolerance      Essential hypertension Controlled, no change in medication DASH diet and  commitment to daily physical activity for a minimum of 30 minutes discussed and encouraged, as a part of hypertension management. The importance of attaining a healthy weight is also discussed.  BP/Weight 07/11/2021 03/02/2021 10/11/2020 09/06/2020 07/11/2020 05/23/2020 01/19/2020  Systolic BP 122 138 137 - 163 - 123  Diastolic BP 63 72 72 - 61 - 78  Wt. (Lbs) 236 242.12 244 - 248 245 249.04  BMI 43.16 44.28 44.63 - 45.36 44.81 45.55       Neck pain on right side Short cpourse of predniosne has arthritis   Right hand pain C/o right middle finger pain and reduced mobility with tenderness in palm of right hand, ortho referral, tenosynovitis  Prediabetes Patient educated about the importance of limiting  Carbohydrate intake , the need to commit to daily physical activity for a minimum of 30 minutes , and to commit weight loss. The fact that changes in all these areas will reduce or eliminate all together the development of diabetes is stressed.   Deteriorated, start metformin Updated lab needed at/ before next visit.  Diabetic Labs Latest Ref Rng & Units 07/05/2021 07/07/2020 07/08/2019 11/10/2018 06/23/2018  HbA1c 4.8 - 5.6 % 6.0(H) - - 5.7(H) -  Chol 100 - 199 mg/dL 06/25/2018 - 419 - -  HDL 622 mg/dL 59 - 57 - -  Calc LDL 0 - 99 mg/dL >29) - 798(X) - -  Triglycerides 0 - 149 mg/dL 72 - 70 - -  Creatinine 0.57 - 1.00 mg/dL 1.28 7.86 7.67 2.09 4.70   BP/Weight 07/11/2021 03/02/2021 10/11/2020 09/06/2020 07/11/2020 05/23/2020 01/19/2020  Systolic BP 122 138 137 - 163 - 123  Diastolic BP 63 72 72 - 61 - 78  Wt. (Lbs) 236 242.12 244 - 248 245 249.04  BMI 43.16 44.28 44.63 - 45.36 44.81 45.55   No flowsheet data found.    Class 3 severe obesity due to excess calories with serious comorbidity and body mass index (BMI) of 40.0 to 44.9 in adult [facility])  Patient re-educated about  the importance of commitment to a  minimum of 150 minutes of exercise per week as able.  The importance of healthy food  choices with portion control discussed, as well as eating regularly and within a 12 hour window most days. The need to choose "clean , green" food 50 to 75% of the time is discussed, as well as to make water the primary drink and set a goal of 64 ounces water daily.    Weight /BMI 07/11/2021 03/02/2021 10/11/2020  WEIGHT 236 lb 242 lb 1.9 oz 244 lb  HEIGHT 5\' 2"  5\' 2"  5\' 2"   BMI 43.16 kg/m2 44.28 kg/m2 44.63 kg/m2   Improved, she is applauded on this   Hyperlipidemia LDL goal <100 Hyperlipidemia:Low fat diet discussed and encouraged.   Lipid Panel  Lab Results  Component Value Date   CHOL 188 07/05/2021   HDL 59 07/05/2021   LDLCALC 116 (H) 07/05/2021   TRIG 72 07/05/2021   CHOLHDL 3.2 07/05/2021  needs to reduce fried and fatty foods     Allergic cough Recent trigger is nicotine exposure, singulair prescribed also short course of predniosne

## 2021-07-11 NOTE — Patient Instructions (Addendum)
F/U I 5 months, call if you need me sooner  Non fasting chem 7 and EGFr, hBA1C, CBC, TSH and vit D 5 days before next appointment  New for blood sugar is metformin 500 mg one daily  New for allergies and cough  is montelukast 1 daily  5 day course of prednisone for neck and shoulder pain.  You are referred to Dr. Aline Brochure for right hand pain.  Please get x-rays of your neck and shoulder today.  Continue to work on increased vegetables and smaller portions   to improve overall health.  Thanks for choosing [facility], we consider it a privelige to serve you.

## 2021-07-15 ENCOUNTER — Encounter: Payer: Self-pay | Admitting: Family Medicine

## 2021-07-15 DIAGNOSIS — R058 Other specified cough: Secondary | ICD-10-CM | POA: Insufficient documentation

## 2021-07-15 NOTE — Assessment & Plan Note (Signed)
°  Patient re-educated about  the importance of commitment to a  minimum of 150 minutes of exercise per week as able.  The importance of healthy food choices with portion control discussed, as well as eating regularly and within a 12 hour window most days. The need to choose "clean , green" food 50 to 75% of the time is discussed, as well as to make water the primary drink and set a goal of 64 ounces water daily.    Weight /BMI 07/11/2021 03/02/2021 10/11/2020  WEIGHT 236 lb 242 lb 1.9 oz 244 lb  HEIGHT 5\' 2"  5\' 2"  5\' 2"   BMI 43.16 kg/m2 44.28 kg/m2 44.63 kg/m2   Improved, she is applauded on this

## 2021-07-15 NOTE — Assessment & Plan Note (Signed)
Recent trigger is nicotine exposure, singulair prescribed also short course of predniosne

## 2021-07-15 NOTE — Assessment & Plan Note (Signed)
Controlled, no change in medication DASH diet and commitment to daily physical activity for a minimum of 30 minutes discussed and encouraged, as a part of hypertension management. The importance of attaining a healthy weight is also discussed.  BP/Weight 07/11/2021 03/02/2021 10/11/2020 09/06/2020 07/11/2020 05/23/2020 01/19/2020  Systolic BP 122 138 137 - 163 - 123  Diastolic BP 63 72 72 - 61 - 78  Wt. (Lbs) 236 242.12 244 - 248 245 249.04  BMI 43.16 44.28 44.63 - 45.36 44.81 45.55

## 2021-07-15 NOTE — Assessment & Plan Note (Signed)
C/o right middle finger pain and reduced mobility with tenderness in palm of right hand, ortho referral, tenosynovitis

## 2021-07-15 NOTE — Assessment & Plan Note (Signed)
Patient educated about the importance of limiting  Carbohydrate intake , the need to commit to daily physical activity for a minimum of 30 minutes , and to commit weight loss. The fact that changes in all these areas will reduce or eliminate all together the development of diabetes is stressed.   Deteriorated, start metformin Updated lab needed at/ before next visit.  Diabetic Labs Latest Ref Rng & Units 07/05/2021 07/07/2020 07/08/2019 11/10/2018 06/23/2018  HbA1c 4.8 - 5.6 % 6.0(H) - - 5.7(H) -  Chol 100 - 199 mg/dL 683 - 419 - -  HDL >62 mg/dL 59 - 57 - -  Calc LDL 0 - 99 mg/dL 229(N) - 989(Q) - -  Triglycerides 0 - 149 mg/dL 72 - 70 - -  Creatinine 0.57 - 1.00 mg/dL 1.19 4.17 4.08 1.44 8.18   BP/Weight 07/11/2021 03/02/2021 10/11/2020 09/06/2020 07/11/2020 05/23/2020 01/19/2020  Systolic BP 122 138 137 - 163 - 123  Diastolic BP 63 72 72 - 61 - 78  Wt. (Lbs) 236 242.12 244 - 248 245 249.04  BMI 43.16 44.28 44.63 - 45.36 44.81 45.55   No flowsheet data found.

## 2021-07-15 NOTE — Assessment & Plan Note (Signed)
Short cpourse of predniosne has arthritis

## 2021-07-15 NOTE — Assessment & Plan Note (Signed)
Hyperlipidemia:Low fat diet discussed and encouraged.   Lipid Panel  Lab Results  Component Value Date   CHOL 188 07/05/2021   HDL 59 07/05/2021   LDLCALC 116 (H) 07/05/2021   TRIG 72 07/05/2021   CHOLHDL 3.2 07/05/2021  needs to reduce fried and fatty foods

## 2021-07-26 ENCOUNTER — Encounter: Payer: Self-pay | Admitting: Orthopedic Surgery

## 2021-07-26 ENCOUNTER — Other Ambulatory Visit: Payer: Self-pay

## 2021-07-26 ENCOUNTER — Ambulatory Visit: Payer: Medicare HMO | Admitting: Orthopedic Surgery

## 2021-07-26 VITALS — BP 131/71 | HR 74 | Ht 63.0 in | Wt 235.8 lb

## 2021-07-26 DIAGNOSIS — M65331 Trigger finger, right middle finger: Secondary | ICD-10-CM

## 2021-07-26 NOTE — Progress Notes (Signed)
New problem ? ?Patricia Fuentes is 76 years old she presents with a 1 month history of pain over the A1 pulley of her right long finger with pain tenderness and locking especially in the morning ? ?The exam shows that the skin over the right hand is normal.  She is tender over the A1 pulley of the right long finger there is no catching or locking but there is decreased range of motion she has good flexion strength of the FDP and FDS color capillary refill normal ? ? ?Encounter Diagnosis  ?Name Primary?  ? Trigger finger, right middle finger Yes  ? ?She opted for [place] injection versus surgery ? ?Trigger finger injection ? ?Diagnosis tenosynovitis right long finger right long finger injection ?Procedure injection A1 pulley ?Medications lidocaine 1% 1 mL and Depo-Medrol 40 mg 1 mL ?Skin prep alcohol and ethyl chloride ?Verbal consent was obtained ?Timeout confirmed the injection site ? ?After cleaning the skin with alcohol and anesthetizing the skin with ethyl chloride the A1 pulley was palpated and the injection was performed without complication ? ?

## 2021-08-09 ENCOUNTER — Ambulatory Visit: Payer: Medicare HMO | Admitting: Orthopedic Surgery

## 2021-08-16 ENCOUNTER — Ambulatory Visit: Payer: Medicare HMO | Admitting: Orthopedic Surgery

## 2021-08-21 ENCOUNTER — Ambulatory Visit (INDEPENDENT_AMBULATORY_CARE_PROVIDER_SITE_OTHER): Payer: Medicare HMO | Admitting: *Deleted

## 2021-08-21 DIAGNOSIS — E785 Hyperlipidemia, unspecified: Secondary | ICD-10-CM

## 2021-08-21 DIAGNOSIS — I10 Essential (primary) hypertension: Secondary | ICD-10-CM

## 2021-08-21 NOTE — Chronic Care Management (AMB) (Signed)
?Chronic Care Management  ? ?CCM RN Visit Note ? ?08/21/2021 ?Name: Patricia Fuentes MRN: [id number] DOB: 23-Jul-1945 ? ?Subjective: ?Patricia Fuentes is a 76 y.o. year old female who is a primary care patient of Fayrene Helper, MD. The care management team was consulted for assistance with disease management and care coordination needs.   ? ?Engaged with patient by telephone for follow up visit in response to provider referral for case management and/or care coordination services.  ? ?Consent to Services:  ?The patient was given information about Chronic Care Management services, agreed to services, and gave verbal consent prior to initiation of services.  Please see initial visit note for detailed documentation.  ? ?Patient agreed to services and verbal consent obtained.  ? ?Assessment: Review of patient past medical history, allergies, medications, health status, including review of consultants reports, laboratory and other test data, was performed as part of comprehensive evaluation and provision of chronic care management services.  ? ?SDOH (Social Determinants of Health) assessments and interventions performed:   ? ?CCM Care Plan ? ?Allergies  ?Allergen Reactions  ? Other   ? Promethazine Hcl Nausea And Vomiting and Other (See Comments)  ?  dizzy  ? ? ?Outpatient Encounter Medications as of 08/21/2021  ?Medication Sig  ? amLODipine (NORVASC) 10 MG tablet TAKE 1 TABLET BY MOUTH ONCE A DAY.  ? calcium-vitamin D (OSCAL-500) 500-400 MG-UNIT tablet Take 1 tablet by mouth 2 (two) times daily.  ? hydrochlorothiazide (MICROZIDE) 12.5 MG capsule Take 1 capsule (12.5 mg total) by mouth daily.  ? metFORMIN (GLUCOPHAGE) 500 MG tablet Take 1 tablet (500 mg total) by mouth daily with breakfast.  ? montelukast (SINGULAIR) 10 MG tablet Take 1 tablet (10 mg total) by mouth at bedtime.  ? OXYGEN Inhale 2 L into the lungs at bedtime as needed.  ? ?No facility-administered encounter medications on file as of 08/21/2021.   ? ? ?Patient Active Problem List  ? Diagnosis Date Noted  ? Allergic cough 07/15/2021  ? Neck pain on right side 07/11/2021  ? Right shoulder pain 07/11/2021  ? Right hand pain 07/11/2021  ? Class 3 severe obesity due to excess calories with serious comorbidity and body mass index (BMI) of 40.0 to 44.9 in adult [facility]) 12/15/2018  ? Supplemental oxygen dependent 12/15/2018  ? Sleep-related hypoxia 12/15/2018  ? Educated about COVID-19 virus infection 10/22/2018  ? Obesity with alveolar hypoventilation and body mass index (BMI) of 40 or greater ([place]) 09/17/2018  ? Insomnia due to medical condition 09/17/2018  ? Hypersomnia with sleep apnea 09/17/2018  ? History of orthopnea 09/17/2018  ? OSA on CPAP   ? Reactive airway disease   ? S/P total knee arthroplasty, left 04/29/17 05/06/2017  ? Anemia 05/06/2017  ? Primary osteoarthritis of both knees   ? Abnormal CT scan, colon 01/18/2016  ? DOE (dyspnea on exertion)   ? Abnormal myocardial perfusion study   ? Fatigue 07/18/2014  ? Vitamin D deficiency 07/18/2014  ? Exercise intolerance 07/18/2014  ? Sleep disorder 07/18/2014  ? Nonspecific abnormal electrocardiogram (ECG) (EKG) 07/18/2014  ? Reduced vision 12/27/2013  ? Radicular pain of thoracic region 06/22/2013  ? Prediabetes 10/20/2012  ? Metabolic syndrome X 15/09/6977  ? BACK PAIN WITH RADICULOPATHY 09/20/2008  ? Hyperlipidemia LDL goal <100 03/29/2008  ? Obesity hypoventilation syndrome ([place]) 03/29/2008  ? Essential hypertension 10/07/2007  ? ? ?Conditions to be addressed/monitored:HTN and HLD ? ?Care Plan : RN Care Manager Plan of Care  ?Updates made by  Kassie Mends, RN since 08/21/2021 12:00 AM  ?  ? ?Problem: No plan of care established for management of chronic disease states  (HTN, HLD)   ?Priority: High  ?  ? ?Long-Range Goal: Development of plan of care for chronic disease management  (HTN, HLD)   ?Start Date: 05/31/2021  ?Expected End Date: 11/27/2021  ?Priority: High  ?Note:   ?Current Barriers:  ?Knowledge  Deficits related to plan of care for management of HTN and HLD  ?Patient reports she lives with spouse, is independent with ADL, IADL's, still drives and works part time, walks "some" but has no formal exercise plan.  Pt requests advanced directives documents be mailed as she did not receive what was previously mailed, advanced directives re-mailed and patient not sure if she has them. ?ASCVD risk enhancing conditions: age >21, HTN ?Does not adhere to provider recommendations re:  does not always adhere to heart healthy, low sodium diet, eats fast food at times. ?Knowledge Deficits related to basic understanding of hypertension pathophysiology and self care management- can benefit from reinforcement management of hypertension, diet, monitoring blood pressure, pt checks blood pressure 2 x week and states " readings usually good" Patient reports she has been trying to get portable oxygen for several months now and having difficulty getting the company to call her back (she is not exactly sure the company name, she found this company on facebook). Pt uses oxygen at 2 liters at hs only with CPAP machine. Pt reports [facility] supplies both CPAP and oxygen, pt reports she called [facility], they no longer supply portable oxygen and she was given number of another company and pt plans to call them back. Patient reports issues with CPAP that mask comes off at night when pt changes positions. Pt reports she will call [place] about the issue with the mask.  ?08/21/21 update- pt decided not to call [place] and states she is going to talk with her doctor about getting a new CPAP machine that does not have a mask and feels this will be better choice for her. ? ?RNCM Clinical Goal(s):  ?Patient will verbalize understanding of plan for management of HTN and HLD as evidenced by patient report, review of EHR and  through collaboration with RN Care manager, provider, and care team.  ? ?Interventions: ?1:1  collaboration with primary care provider regarding development and update of comprehensive plan of care as evidenced by provider attestation and co-signature ?Inter-disciplinary care team collaboration (see longitudinal plan of care) ?Evaluation of current treatment plan related to  self management and patient's adherence to plan as established by provider ? ?Hyperlipidemia:  (Status: Goal on Track (progressing): YES.) Long Term Goal  ?Lab Results  ?Component Value Date  ? CHOL 177 07/08/2019  ? HDL 57 07/08/2019  ? LDLCALC 106 (H) 07/08/2019  ? TRIG 70 07/08/2019  ? CHOLHDL 3.1 07/08/2019  ?  ? ?Medication review performed; medication list updated in electronic medical record.  ?Counseled on importance of regular laboratory monitoring as prescribed; ?Reviewed importance of limiting foods high in cholesterol; ?Reviewed exercise goals and target of 150 minutes per week; ?Reviewed heart healthy diet ? ?Hypertension Interventions:  (Status:  Goal on track:  Yes.) Long Term Goal ?Last practice recorded BP readings:  ?BP Readings from Last 3 Encounters:  ?03/02/21 138/72  ?10/11/20 137/72  ?07/11/20 (!) 163/61  ?Most recent eGFR/CrCl: No results found for: EGFR  No components found for: CRCL ? ?Provided education to patient re:  stroke prevention, s/s of heart attack and stroke ?Reviewed medications with patient and discussed importance of compliance ?Advised patient, providing education and rationale, to monitor blood pressure daily and record, calling PCP for findings outside established parameters  ?Reviewed importance of following low sodium diet, reading lables ? ?Patient Goals/Self-Care Activities: ?Take medications as prescribed   ?Attend all scheduled provider appointments ?Perform IADL's (shopping, preparing meals, housekeeping, managing finances) independently ?Call provider office for new concerns or questions  ?check blood pressure 3 times per week ?write blood pressure results in a log or diary ?take blood  pressure log to all doctor appointments ?keep all doctor appointments ?take medications for blood pressure exactly as prescribed ?report new symptoms to your doctor ?eat more whole grains, fruits and vegetables,

## 2021-08-21 NOTE — Patient Instructions (Signed)
Visit Information ? ?Thank you for taking time to visit with me today. Please don't hesitate to contact me if I can be of assistance to you before our next scheduled telephone appointment. ? ?Following are the goals we discussed today:  ?Take medications as prescribed   ?Attend all scheduled provider appointments ?Perform IADL's (shopping, preparing meals, housekeeping, managing finances) independently ?Call provider office for new concerns or questions  ?check blood pressure 3 times per week ?write blood pressure results in a log or diary ?take blood pressure log to all doctor appointments ?keep all doctor appointments ?take medications for blood pressure exactly as prescribed ?report new symptoms to your doctor ?eat more whole grains, fruits and vegetables, lean meats and healthy fats ?- call for medicine refill 2 or 3 days before it runs out ?- take all medications exactly as prescribed ?- call doctor when you experience any new symptoms ?- go to all doctor appointments as scheduled ?- adhere to prescribed diet: heart healthy ?- develop an exercise routine ?Follow low sodium diet, read labels and limit fast food ?Talk with your doctor if you are interested in a different type of CPAP machine without a mask ? ?Our next appointment is by telephone on 11/08/21 at 130 pm ? ?Please call the care guide team at [phone] if you need to cancel or reschedule your appointment.  ? ?If you are experiencing a Mental Health or Behavioral Health Crisis or need someone to talk to, please call the Suicide and Crisis Lifeline: 988 ?call the [place] National Suicide Prevention Lifeline: 859-781-5358 or TTY: 302-238-3855 TTY (248)710-2205) to talk to a trained counselor ?call 1-800-273-TALK (toll free, 24 hour hotline) ?go to [facility] [address], [place] [phone]) ?call the [facility]: [phone] ?call 911  ? ?The patient verbalized understanding of  instructions, educational materials, and care plan provided today and declined offer to receive copy of patient instructions, educational materials, and care plan.  ? ?Irving Shows RNC, BSN ?RN Case Manager ?[facility] ?[phone] ? ?

## 2021-08-24 DIAGNOSIS — E785 Hyperlipidemia, unspecified: Secondary | ICD-10-CM

## 2021-08-24 DIAGNOSIS — I10 Essential (primary) hypertension: Secondary | ICD-10-CM

## 2021-09-15 ENCOUNTER — Other Ambulatory Visit: Payer: Self-pay | Admitting: Family Medicine

## 2021-10-01 ENCOUNTER — Ambulatory Visit (INDEPENDENT_AMBULATORY_CARE_PROVIDER_SITE_OTHER): Payer: Medicare HMO

## 2021-10-01 DIAGNOSIS — Z0000 Encounter for general adult medical examination without abnormal findings: Secondary | ICD-10-CM

## 2021-10-01 NOTE — Progress Notes (Signed)
? ?Subjective:  ? Patricia Fuentes is a 76 y.o. female who presents for Medicare Annual (Subsequent) preventive examination. ? ?Review of Systems    ?I connected with  Patricia Fuentes on 10/01/21 by a audio enabled telemedicine application and verified that I am speaking with the correct person using two identifiers. ? ?Patient Location: Home ? ?Provider Location: Office/Clinic ? ?I discussed the limitations of evaluation and management by telemedicine. The patient expressed understanding and agreed to proceed.  ?  ? ?   ?Objective:  ?  ?There were no vitals filed for this visit. ?There is no height or weight on file to calculate BMI. ? ? ?  02/08/2021  ? 10:38 AM 09/06/2020  ?  3:08 PM 09/06/2019  ?  1:25 PM 09/03/2018  ? 10:18 AM 06/08/2018  ? 10:33 PM 06/08/2018  ?  3:23 PM 09/01/2017  ?  3:07 PM  ?Advanced Directives  ?Does Patient Have a Medical Advance Directive? No No No No No No No  ?Would patient like information on creating a medical advance directive? Yes (MAU/Ambulatory/Procedural Areas - Information given) No - Patient declined Yes (ED - Information included in AVS)  Yes (Inpatient - patient requests chaplain consult to create a medical advance directive) No - Patient declined Yes (MAU/Ambulatory/Procedural Areas - Information given)  ? ? ?Current Medications (verified) ?Outpatient Encounter Medications as of 10/01/2021  ?Medication Sig  ? amLODipine (NORVASC) 10 MG tablet TAKE 1 TABLET BY MOUTH ONCE A DAY.  ? calcium-vitamin D (OSCAL-500) 500-400 MG-UNIT tablet Take 1 tablet by mouth 2 (two) times daily.  ? hydrochlorothiazide (MICROZIDE) 12.5 MG capsule Take 1 capsule (12.5 mg total) by mouth daily.  ? metFORMIN (GLUCOPHAGE) 500 MG tablet Take 1 tablet (500 mg total) by mouth daily with breakfast.  ? montelukast (SINGULAIR) 10 MG tablet Take 1 tablet (10 mg total) by mouth at bedtime.  ? OXYGEN Inhale 2 L into the lungs at bedtime as needed.  ? ?No facility-administered encounter medications on file as of  10/01/2021.  ? ? ?Allergies (verified) ?Other and Promethazine hcl  ? ?History: ?Past Medical History:  ?Diagnosis Date  ? Bronchitis   ? Chronic back pain   ? Essential hypertension   ? Hyperlipidemia   ? IGT (impaired glucose tolerance) 2015  ? Obesity, unspecified   ? Osteoarthritis   ? Seasonal allergies   ? ?Past Surgical History:  ?Procedure Laterality Date  ? CARDIAC CATHETERIZATION N/A 10/25/2014  ? Procedure: Left Heart Cath and Coronary Angiography;  Surgeon: Kathleene Hazel, MD;  Location[facility] INVASIVE CV LAB;  Service: Cardiovascular;  Laterality: N/A;  ? COLONOSCOPY N/A 02/09/2016  ? Procedure: COLONOSCOPY;  Surgeon: West Bali, MD;  Location: [facility] ENDO SUITE;  Service: Endoscopy;  Laterality: N/A;  12:30 PM  ? JOINT REPLACEMENT N/A   ? [employer] 07/27/2020  ? KNEE ARTHROSCOPY Left   ? TOTAL ABDOMINAL HYSTERECTOMY  1995 approx  ? Fibroids  ? TOTAL KNEE ARTHROPLASTY Right 2011  ? TOTAL KNEE ARTHROPLASTY Left 04/29/2017  ? Procedure: TOTAL KNEE ARTHROPLASTY;  Surgeon: Vickki Hearing, MD;  Location: [facility] ORS;  Service: Orthopedics;  Laterality: Left;  ? ?Family History  ?Problem Relation Age of Onset  ? Hypertension Mother   ? Stroke Mother 57  ?     Deceased due CVa at 28  ? Diabetes Father   ? Cancer Father 38  ?     Unknown, somewhere in stomach  ? Hypertension Brother   ? Hypertension Brother   ?  Hypertension Brother   ? Cancer Maternal Grandfather   ?     ?type  ? Hypertension Son   ? ?Social History  ? ?Socioeconomic History  ? Marital status: Married  ?  Spouse name: Not on file  ? Number of children: Not on file  ? Years of education: Not on file  ? Highest education level: Not on file  ?Occupational History  ? Occupation: retired  ?  Employer: UNEMPLOYED  ?Tobacco Use  ? Smoking status: Never  ? Smokeless tobacco: Never  ?Vaping Use  ? Vaping Use: Never used  ?Substance and Sexual Activity  ? Alcohol use: No  ? Drug use: No  ? Sexual activity: Yes  ?  Birth control/protection: Surgical   ?Other Topics Concern  ? Not on file  ?Social History Narrative  ? Not on file  ? ?Social Determinants of Health  ? ?Financial Resource Strain: Not on file  ?Food Insecurity: No Food Insecurity  ? Worried About Programme researcher, broadcasting/film/videounning Out of Food in the Last Year: Never true  ? Ran Out of Food in the Last Year: Never true  ?Transportation Needs: No Transportation Needs  ? Lack of Transportation (Medical): No  ? Lack of Transportation (Non-Medical): No  ?Physical Activity: Not on file  ?Stress: Not on file  ?Social Connections: Not on file  ? ? ?Tobacco Counseling ?Counseling given: Not Answered ? ? ?Clinical Intake: ? ?  ? ?  ? ?  ? ?  ? ?  ? ?Diabetic? NO ? ?  ? ?  ? ? ?Activities of Daily Living ?   ? View : No data to display.  ?  ?  ?  ? ? ? ?Patient Care Team: ?Kerri PerchesSimpson, Margaret E, MD as PCP - General ?Laqueta LindenKoneswaran, Suresh A, MD (Inactive) as PCP - Cardiology (Cardiology) ?Vickki HearingHarrison, Stanley E, MD as Consulting Physician (Orthopedic Surgery) ?Fields, Darleene CleaverSandi L, MD (Inactive) as Consulting Physician (Gastroenterology) ?Audrie GallusFarmer, Julie A, RN as [facility] Care Management ? ?Indicate any recent Medical Services you may have received from other than [facility] providers in the past year (date may be approximate). ? ?   ?Assessment:  ? This is a routine wellness examination for Patricia Fuentes. ? ?Hearing/Vision screen ?No results found. ? ?Dietary issues and exercise activities discussed: ?  ? ? Goals Addressed   ?None ?  ?Depression Screen ? ?  07/11/2021  ?  2:49 PM 03/02/2021  ?  3:12 PM 02/20/2021  ?  9:07 AM 02/08/2021  ? 11:14 AM 09/06/2020  ?  3:12 PM 07/28/2020  ?  8:42 AM 07/11/2020  ?  3:42 PM  ?PHQ 2/9 Scores  ?PHQ - 2 Score 0 0 0 0 0 0 0  ?  ?Fall Risk ? ?  07/11/2021  ?  2:49 PM 03/02/2021  ?  3:11 PM 02/20/2021  ?  9:07 AM 02/08/2021  ? 10:25 AM 10/11/2020  ?  3:26 PM  ?Fall Risk   ?Falls in the past year? 0 0 0 0 0  ?Number falls in past yr: 0 0 0  0  ?Injury with Fall? 0 0 0  0  ?Risk for fall due to :  No Fall Risks No Fall Risks     ?Follow up  Falls evaluation completed Falls evaluation completed    ? ? ?FALL RISK PREVENTION PERTAINING TO THE HOME: ? ?Any stairs in or around the home? Yes  ?If so, are there any without handrails? Yes  ?Home free of loose throw rugs  in walkways, pet beds, electrical cords, etc? Yes  ?Adequate lighting in your home to reduce risk of falls? Yes  ? ?ASSISTIVE DEVICES UTILIZED TO PREVENT FALLS: ? ?Life alert? No  ?Use of a cane, walker or w/c? No  ?Grab bars in the bathroom? Yes  ?Shower chair or bench in shower? No  ?Elevated toilet seat or a handicapped toilet? Yes  ? ?  ?  ? ?  09/03/2018  ? 10:21 AM 09/01/2017  ?  3:08 PM 07/18/2016  ?  3:44 PM  ?6CIT Screen  ?What Year? 0 points 0 points 0 points  ?What month? 0 points 0 points 0 points  ?What time? 0 points 0 points 0 points  ?Count back from 20 0 points 0 points 0 points  ?Months in reverse 0 points 0 points 0 points  ?Repeat phrase 2 points 0 points 0 points  ?Total Score 2 points 0 points 0 points  ? ? ?Immunizations ?Immunization History  ?Administered Date(s) Administered  ? Fluad Quad(high Dose 65+) 03/10/2019, 03/02/2021  ? Influenza, High Dose Seasonal PF 05/05/2018  ? Influenza,inj,Quad PF,6+ Mos 06/22/2013, 07/18/2014, 07/04/2015, 03/21/2016, 02/11/2017, 01/19/2020  ? Moderna Sars-Covid-2 Vaccination 06/19/2019, 07/19/2019, 04/13/2020  ? PPD Test 01/15/2018  ? Pneumococcal Conjugate-13 12/27/2013  ? Pneumococcal Polysaccharide-23 12/15/2012  ? ? ?TDAP status: Due, Education has been provided regarding the importance of this vaccine. Advised may receive this vaccine at local pharmacy or Health Dept. Aware to provide a copy of the vaccination record if obtained from local pharmacy or Health Dept. Verbalized acceptance and understanding. ? ?Flu Vaccine status: Up to date ? ?Pneumococcal vaccine status: Up to date ? ?Covid-19 vaccine status: Completed vaccines ? ?Qualifies for Shingles Vaccine? Yes   ?Zostavax completed No   ?Shingrix Completed?: No.     Education has been provided regarding the importance of this vaccine. Patient has been advised to call insurance company to determine out of pocket expense if they have not yet received this vaccine. Advised may

## 2021-10-01 NOTE — Patient Instructions (Addendum)
Ms. Maves , ?Thank you for taking time to come for your Medicare Wellness Visit. I appreciate your ongoing commitment to your health goals. Please review the following plan we discussed and let me know if I can assist you in the future.  ? ?Screening recommendations/referrals: ?Colonoscopy: Complete ?Mammogram: Complete  ?Bone Density: Complete ?Recommended yearly ophthalmology/optometry visit for glaucoma screening and checkup ?Recommended yearly dental visit for hygiene and checkup ? ?Vaccinations: ?Influenza vaccine: Complete ?Pneumococcal vaccine: Complete ?Tdap vaccine: Due now  ?Shingles vaccine: Due now    ? ?Advanced directives: Mailed information  ? ?Conditions/risks identified: Hypertension  ? ?Next appointment: 1 year  ? ? ?Preventive Care 15 Years and Older, Female ?Preventive care refers to lifestyle choices and visits with your health care provider that can promote health and wellness. ?What does preventive care include? ?A yearly physical exam. This is also called an annual well check. ?Dental exams once or twice a year. ?Routine eye exams. Ask your health care provider how often you should have your eyes checked. ?Personal lifestyle choices, including: ?Daily care of your teeth and gums. ?Regular physical activity. ?Eating a healthy diet. ?Avoiding tobacco and drug use. ?Limiting alcohol use. ?Practicing safe sex. ?Taking low-dose aspirin every day. ?Taking vitamin and mineral supplements as recommended by your health care provider. ?What happens during an annual well check? ?The services and screenings done by your health care provider during your annual well check will depend on your age, overall health, lifestyle risk factors, and family history of disease. ?Counseling  ?Your health care provider may ask you questions about your: ?Alcohol use. ?Tobacco use. ?Drug use. ?Emotional well-being. ?Home and relationship well-being. ?Sexual activity. ?Eating habits. ?History of falls. ?Memory and  ability to understand (cognition). ?Work and work Astronomer. ?Reproductive health. ?Screening  ?You may have the following tests or measurements: ?Height, weight, and BMI. ?Blood pressure. ?Lipid and cholesterol levels. These may be checked every 5 years, or more frequently if you are over 83 years old. ?Skin check. ?Lung cancer screening. You may have this screening every year starting at age 67 if you have a 30-pack-year history of smoking and currently smoke or have quit within the past 15 years. ?Fecal occult blood test (FOBT) of the stool. You may have this test every year starting at age 65. ?Flexible sigmoidoscopy or colonoscopy. You may have a sigmoidoscopy every 5 years or a colonoscopy every 10 years starting at age 87. ?Hepatitis C blood test. ?Hepatitis B blood test. ?Sexually transmitted disease (STD) testing. ?Diabetes screening. This is done by checking your blood sugar (glucose) after you have not eaten for a while (fasting). You may have this done every 1-3 years. ?Bone density scan. This is done to screen for osteoporosis. You may have this done starting at age 64. ?Mammogram. This may be done every 1-2 years. Talk to your health care provider about how often you should have regular mammograms. ?Talk with your health care provider about your test results, treatment options, and if necessary, the need for more tests. ?Vaccines  ?Your health care provider may recommend certain vaccines, such as: ?Influenza vaccine. This is recommended every year. ?Tetanus, diphtheria, and acellular pertussis (Tdap, Td) vaccine. You may need a Td booster every 10 years. ?Zoster vaccine. You may need this after age 55. ?Pneumococcal 13-valent conjugate (PCV13) vaccine. One dose is recommended after age 94. ?Pneumococcal polysaccharide (PPSV23) vaccine. One dose is recommended after age 76. ?Talk to your health care provider about which screenings and vaccines you need  and how often you need them. ?This information is  not intended to replace advice given to you by your health care provider. Make sure you discuss any questions you have with your health care provider. ?Document Released: 06/09/2015 Document Revised: 01/31/2016 Document Reviewed: 03/14/2015 ?Elsevier Interactive Patient Education ? 2017 [place]. ? ?Fall Prevention in the Home ?Falls can cause injuries. They can happen to people of all ages. There are many things you can do to make your home safe and to help prevent falls. ?What can I do on the outside of my home? ?Regularly fix the edges of walkways and driveways and fix any cracks. ?Remove anything that might make you trip as you walk through a door, such as a raised step or threshold. ?Trim any bushes or trees on the path to your home. ?Use bright outdoor lighting. ?Clear any walking paths of anything that might make someone trip, such as rocks or tools. ?Regularly check to see if handrails are loose or broken. Make sure that both sides of any steps have handrails. ?Any raised decks and porches should have guardrails on the edges. ?Have any leaves, snow, or ice cleared regularly. ?Use sand or salt on walking paths during winter. ?Clean up any spills in your garage right away. This includes oil or grease spills. ?What can I do in the bathroom? ?Use night lights. ?Install grab bars by the toilet and in the tub and shower. Do not use towel bars as grab bars. ?Use non-skid mats or decals in the tub or shower. ?If you need to sit down in the shower, use a plastic, non-slip stool. ?Keep the floor dry. Clean up any water that spills on the floor as soon as it happens. ?Remove soap buildup in the tub or shower regularly. ?Attach bath mats securely with double-sided non-slip rug tape. ?Do not have throw rugs and other things on the floor that can make you trip. ?What can I do in the bedroom? ?Use night lights. ?Make sure that you have a light by your bed that is easy to reach. ?Do not use any sheets or blankets that  are too big for your bed. They should not hang down onto the floor. ?Have a firm chair that has side arms. You can use this for support while you get dressed. ?Do not have throw rugs and other things on the floor that can make you trip. ?What can I do in the kitchen? ?Clean up any spills right away. ?Avoid walking on wet floors. ?Keep items that you use a lot in easy-to-reach places. ?If you need to reach something above you, use a strong step stool that has a grab bar. ?Keep electrical cords out of the way. ?Do not use floor polish or wax that makes floors slippery. If you must use wax, use non-skid floor wax. ?Do not have throw rugs and other things on the floor that can make you trip. ?What can I do with my stairs? ?Do not leave any items on the stairs. ?Make sure that there are handrails on both sides of the stairs and use them. Fix handrails that are broken or loose. Make sure that handrails are as long as the stairways. ?Check any carpeting to make sure that it is firmly attached to the stairs. Fix any carpet that is loose or worn. ?Avoid having throw rugs at the top or bottom of the stairs. If you do have throw rugs, attach them to the floor with carpet tape. ?Make sure that you  have a light switch at the top of the stairs and the bottom of the stairs. If you do not have them, ask someone to add them for you. ?What else can I do to help prevent falls? ?Wear shoes that: ?Do not have high heels. ?Have rubber bottoms. ?Are comfortable and fit you well. ?Are closed at the toe. Do not wear sandals. ?If you use a stepladder: ?Make sure that it is fully opened. Do not climb a closed stepladder. ?Make sure that both sides of the stepladder are locked into place. ?Ask someone to hold it for you, if possible. ?Clearly mark and make sure that you can see: ?Any grab bars or handrails. ?First and last steps. ?Where the edge of each step is. ?Use tools that help you move around (mobility aids) if they are needed. These  include: ?Canes. ?Walkers. ?Scooters. ?Crutches. ?Turn on the lights when you go into a dark area. Replace any light bulbs as soon as they burn out. ?Set up your furniture so you have a clear path. Avoid movi

## 2021-11-07 ENCOUNTER — Telehealth: Payer: Self-pay

## 2021-11-07 NOTE — Telephone Encounter (Signed)
Patient called and needs to reschedule her appointment for 06.15.2023.

## 2021-11-08 ENCOUNTER — Telehealth: Payer: Medicare HMO

## 2021-11-08 NOTE — Telephone Encounter (Signed)
Patient calling back today to cancel and reschedule appt

## 2021-11-29 ENCOUNTER — Telehealth: Payer: Medicare HMO

## 2021-12-06 ENCOUNTER — Ambulatory Visit (INDEPENDENT_AMBULATORY_CARE_PROVIDER_SITE_OTHER): Payer: Medicare HMO | Admitting: *Deleted

## 2021-12-06 DIAGNOSIS — E785 Hyperlipidemia, unspecified: Secondary | ICD-10-CM

## 2021-12-06 DIAGNOSIS — I10 Essential (primary) hypertension: Secondary | ICD-10-CM

## 2021-12-06 NOTE — Patient Instructions (Signed)
Visit Information  Thank you for taking time to visit with me today. Please don't hesitate to contact me if I can be of assistance to you before our next scheduled telephone appointment.  Following are the goals we discussed today:  Take medications as prescribed   Attend all scheduled provider appointments Perform IADL's (shopping, preparing meals, housekeeping, managing finances) independently Call provider office for new concerns or questions  check blood pressure 3 times per week write blood pressure results in a log or diary take blood pressure log to all doctor appointments keep all doctor appointments take medications for blood pressure exactly as prescribed report new symptoms to your doctor eat more whole grains, fruits and vegetables, lean meats and healthy fats - call for medicine refill 2 or 3 days before it runs out - take all medications exactly as prescribed - call doctor when you experience any new symptoms - go to all doctor appointments as scheduled - adhere to prescribed diet: heart healthy - develop an exercise routine Follow low sodium diet, read labels and limit fast food Talk with your doctor if you are interested in a different type of CPAP machine without a mask Case closure today, please talk with your doctor if you have any further case management needs in the future  Please call the care guide team at [phone] if you need to cancel or reschedule your appointment.   If you are experiencing a Mental Health or Behavioral Health Crisis or need someone to talk to, please call the Suicide and Crisis Lifeline: 988 call the [place] National Suicide Prevention Lifeline: 740-400-5232 or TTY: (218)852-5800 TTY 3436567098) to talk to a trained counselor call 1-800-273-TALK (toll free, 24 hour hotline) go to [facility] [address] [place] [phone]) call the [facility] Line: [phone] call 911    The patient verbalized understanding of instructions, educational materials, and care plan provided today and DECLINED offer to receive copy of patient instructions, educational materials, and care plan.   Irving Shows [facility], BSN RN Case Manager [facility] [phone]

## 2021-12-06 NOTE — Chronic Care Management (AMB) (Signed)
Chronic Care Management   CCM RN Visit Note  12/06/2021 Name: Patricia Fuentes MRN: [id number] DOB: 1946-02-04  Subjective: Patricia Fuentes is a 76 y.o. year old female who is a primary care patient of Moshe Cipro Norwood Levo, MD. The care management team was consulted for assistance with disease management and care coordination needs.    Engaged with patient by telephone for follow up visit in response to provider referral for case management and/or care coordination services.   Consent to Services:  The patient was given information about Chronic Care Management services, agreed to services, and gave verbal consent prior to initiation of services.  Please see initial visit note for detailed documentation.   Patient agreed to services and verbal consent obtained.   Assessment: Review of patient past medical history, allergies, medications, health status, including review of consultants reports, laboratory and other test data, was performed as part of comprehensive evaluation and provision of chronic care management services.   SDOH (Social Determinants of Health) assessments and interventions performed:    CCM Care Plan  Allergies  Allergen Reactions   Other    Promethazine Hcl Nausea And Vomiting and Other (See Comments)    dizzy    Outpatient Encounter Medications as of 12/06/2021  Medication Sig   amLODipine (NORVASC) 10 MG tablet TAKE 1 TABLET BY MOUTH ONCE A DAY.   calcium-vitamin D (OSCAL-500) 500-400 MG-UNIT tablet Take 1 tablet by mouth 2 (two) times daily.   metFORMIN (GLUCOPHAGE) 500 MG tablet Take 1 tablet (500 mg total) by mouth daily with breakfast.   montelukast (SINGULAIR) 10 MG tablet Take 1 tablet (10 mg total) by mouth at bedtime.   OXYGEN Inhale 2 L into the lungs at bedtime as needed.   hydrochlorothiazide (MICROZIDE) 12.5 MG capsule Take 1 capsule (12.5 mg total) by mouth daily. (Patient not taking: Reported on 10/01/2021)   No facility-administered encounter  medications on file as of 12/06/2021.    Patient Active Problem List   Diagnosis Date Noted   Allergic cough 07/15/2021   Neck pain on right side 07/11/2021   Right shoulder pain 07/11/2021   Right hand pain 07/11/2021   Class 3 severe obesity due to excess calories with serious comorbidity and body mass index (BMI) of 40.0 to 44.9 in adult ([place]) 12/15/2018   Supplemental oxygen dependent 12/15/2018   Sleep-related hypoxia 12/15/2018   Educated about COVID-19 virus infection 10/22/2018   Obesity with alveolar hypoventilation and body mass index (BMI) of 40 or greater ([place]) 09/17/2018   Insomnia due to medical condition 09/17/2018   Hypersomnia with sleep apnea 09/17/2018   History of orthopnea 09/17/2018   OSA on CPAP    Reactive airway disease    S/P total knee arthroplasty, left 04/29/17 05/06/2017   Anemia 05/06/2017   Primary osteoarthritis of both knees    Abnormal CT scan, colon 01/18/2016   DOE (dyspnea on exertion)    Abnormal myocardial perfusion study    Fatigue 07/18/2014   Vitamin D deficiency 07/18/2014   Exercise intolerance 07/18/2014   Sleep disorder 07/18/2014   Nonspecific abnormal electrocardiogram (ECG) (EKG) 07/18/2014   Reduced vision 12/27/2013   Radicular pain of thoracic region 06/22/2013   Prediabetes 94/70/9628   Metabolic syndrome X 36/62/9476   BACK PAIN WITH RADICULOPATHY 09/20/2008   Hyperlipidemia LDL goal <100 03/29/2008   Obesity hypoventilation syndrome ([place]) 03/29/2008   Essential hypertension 10/07/2007    Conditions to be addressed/monitored:HTN and HLD  Care Plan : RN Care Manager Plan  of Care  Updates made by Kassie Mends, RN since 12/06/2021 12:00 AM     Problem: No plan of care established for management of chronic disease states  (HTN, HLD)   Priority: High     Long-Range Goal: Development of plan of care for chronic disease management  (HTN, HLD)   Start Date: 05/31/2021  Expected End Date: 12/06/2021  Priority: High   Note:   Current Barriers:  Knowledge Deficits related to plan of care for management of HTN and HLD  Patient reports she lives with spouse, is independent with ADL, IADL's, still drives and works part time, walks "some" but has no formal exercise plan.  Pt has not completed advanced directives. ASCVD risk enhancing conditions: age >72, HTN Does not adhere to provider recommendations re:  does not always adhere to heart healthy, low sodium diet, eats fast food at times. Knowledge Deficits related to basic understanding of hypertension pathophysiology and self care management-  pt checks blood pressure 2 x week and states " readings usually good" Patient reports she has not pursued any further getting portable 02 and states " I think they told me I'm not a candidate for that"  Pt uses oxygen at 2 liters at hs only with CPAP machine. Pt reports [facility] supplies both CPAP and oxygen.  Patient denies any issues today with CPAP and has not discussed with doctor about getting a different type of mask, no new concerns reported today.  RNCM Clinical Goal(s):  Patient will verbalize understanding of plan for management of HTN and HLD as evidenced by patient report, review of EHR and  through collaboration with RN Care manager, provider, and care team.   Interventions: 1:1 collaboration with primary care provider regarding development and update of comprehensive plan of care as evidenced by provider attestation and co-signature Inter-disciplinary care team collaboration (see longitudinal plan of care) Evaluation of current treatment plan related to  self management and patient's adherence to plan as established by provider  Hyperlipidemia:  (Status: Goal on Track (progressing): YES.) Long Term Goal  Lab Results  Component Value Date   CHOL 177 07/08/2019   HDL 57 07/08/2019   LDLCALC 106 (H) 07/08/2019   TRIG 70 07/08/2019   CHOLHDL 3.1 07/08/2019     Medication review performed;  medication list updated in electronic medical record.  Counseled on importance of regular laboratory monitoring as prescribed; Reviewed importance of limiting foods high in cholesterol; Reviewed exercise goals and target of 150 minutes per week; Reinforced heart healthy diet Reviewed upcoming scheduled appointments  Hypertension Interventions:  (Status:  Goal on track:  Yes.) Long Term Goal Last practice recorded BP readings:  BP Readings from Last 3 Encounters:  03/02/21 138/72  10/11/20 137/72  07/11/20 (!) 163/61  Most recent eGFR/CrCl: No results found for: EGFR  No components found for: CRCL  Provided education to patient re: stroke prevention, s/s of heart attack and stroke Reviewed medications with patient and discussed importance of compliance Advised patient, providing education and rationale, to monitor blood pressure daily and record, calling PCP for findings outside established parameters  Reinforced importance of following low sodium diet, reading lables Reviewed importance of exercise Reviewed plan of care and case closure today  Patient Goals/Self-Care Activities: Take medications as prescribed   Attend all scheduled provider appointments Perform IADL's (shopping, preparing meals, housekeeping, managing finances) independently Call provider office for new concerns or questions  check blood pressure 3 times per week write blood pressure results in  a log or diary take blood pressure log to all doctor appointments keep all doctor appointments take medications for blood pressure exactly as prescribed report new symptoms to your doctor eat more whole grains, fruits and vegetables, lean meats and healthy fats - call for medicine refill 2 or 3 days before it runs out - take all medications exactly as prescribed - call doctor when you experience any new symptoms - go to all doctor appointments as scheduled - adhere to prescribed diet: heart healthy - develop an exercise  routine Follow low sodium diet, read labels and limit fast food Talk with your doctor if you are interested in a different type of CPAP machine without a mask Case closure today, please talk with your doctor if you have any further case management needs in the future          Plan:No further follow up required: case closure today  Jacqlyn Larsen [facility], BSN RN Case Manager [facility] [phone]

## 2021-12-11 ENCOUNTER — Encounter: Payer: Self-pay | Admitting: Family Medicine

## 2021-12-11 ENCOUNTER — Ambulatory Visit (INDEPENDENT_AMBULATORY_CARE_PROVIDER_SITE_OTHER): Payer: Medicare HMO | Admitting: Family Medicine

## 2021-12-11 VITALS — BP 119/71 | HR 72 | Ht 62.0 in | Wt 237.1 lb

## 2021-12-11 DIAGNOSIS — E559 Vitamin D deficiency, unspecified: Secondary | ICD-10-CM | POA: Diagnosis not present

## 2021-12-11 DIAGNOSIS — I10 Essential (primary) hypertension: Secondary | ICD-10-CM | POA: Diagnosis not present

## 2021-12-11 DIAGNOSIS — M542 Cervicalgia: Secondary | ICD-10-CM | POA: Diagnosis not present

## 2021-12-11 DIAGNOSIS — G8929 Other chronic pain: Secondary | ICD-10-CM | POA: Diagnosis not present

## 2021-12-11 DIAGNOSIS — R7303 Prediabetes: Secondary | ICD-10-CM | POA: Diagnosis not present

## 2021-12-11 DIAGNOSIS — E662 Morbid (severe) obesity with alveolar hypoventilation: Secondary | ICD-10-CM | POA: Diagnosis not present

## 2021-12-11 MED ORDER — DICLOFENAC SODIUM 1 % EX GEL: CUTANEOUS | 1 refills | Status: DC

## 2021-12-11 NOTE — Patient Instructions (Addendum)
Keep October appointment as scheduled, call if you need me sooner  Please get labs ordered 07/11/2021 today  Please get shingrix vaccines at your pharmacy as soon as possible  Also need tdAP  You are referred twice weekly to pT for neck pain, and diclofenac gel is prescribed for use up to 3 times per week  Think about what you will eat, plan ahead. Choose " clean, green, fresh or frozen" over canned, processed or packaged foods which are more sugary, salty and fatty. 70 to 75% of food eaten should be vegetables and fruit. Three meals at set times with snacks allowed between meals, but they must be fruit or vegetables. Aim to eat over a 12 hour period , example 7 am to 7 pm, and STOP after  your last meal of the day. Drink water,generally about 64 ounces per day, no other drink is as healthy. Fruit juice is best enjoyed in a healthy way, by EATING the fruit.   Thanks for choosing [facility], we consider it a privelige to serve you.]

## 2021-12-12 LAB — HEMOGLOBIN A1C
Est. average glucose Bld gHb Est-mCnc: 117 mg/dL
Hgb A1c MFr Bld: 5.7 % — ABNORMAL HIGH (ref 4.8–5.6)

## 2021-12-12 LAB — BMP8+EGFR
BUN/Creatinine Ratio: 13 (ref 12–28)
BUN: 10 mg/dL (ref 8–27)
CO2: 32 mmol/L — ABNORMAL HIGH (ref 20–29)
Calcium: 9.3 mg/dL (ref 8.7–10.3)
Chloride: 98 mmol/L (ref 96–106)
Creatinine, Ser: 0.75 mg/dL (ref 0.57–1.00)
Glucose: 92 mg/dL (ref 70–99)
Potassium: 3.9 mmol/L (ref 3.5–5.2)
Sodium: 141 mmol/L (ref 134–144)
eGFR: 83 mL/min/{1.73_m2} (ref >59)

## 2021-12-12 LAB — VITAMIN D 25 HYDROXY (VIT D DEFICIENCY, FRACTURES): Vit D, 25-Hydroxy: 13.8 ng/mL — ABNORMAL LOW (ref 30.0–100.0)

## 2021-12-12 LAB — CBC
Hematocrit: 35.8 % (ref 34.0–46.6)
Hemoglobin: 11.9 g/dL (ref 11.1–15.9)
MCH: 28.6 pg (ref 26.6–33.0)
MCHC: 33.2 g/dL (ref 31.5–35.7)
MCV: 86 fL (ref 79–97)
Platelets: 204 10*3/uL (ref 150–450)
RBC: 4.16 x10E6/uL (ref 3.77–5.28)
RDW: 13.1 % (ref 11.7–15.4)
WBC: 5.7 10*3/uL (ref 3.4–10.8)

## 2021-12-12 LAB — TSH: TSH: 1.68 u[IU]/mL (ref 0.450–4.500)

## 2021-12-16 ENCOUNTER — Encounter: Payer: Self-pay | Admitting: Family Medicine

## 2021-12-16 NOTE — Assessment & Plan Note (Signed)
  Patient re-educated about  the importance of commitment to a  minimum of 150 minutes of exercise per week as able.  The importance of healthy food choices with portion control discussed, as well as eating regularly and within a 12 hour window most days. The need to choose "clean , green" food 50 to 75% of the time is discussed, as well as to make water the primary drink and set a goal of 64 ounces water daily.       12/11/2021    2:52 PM 07/26/2021    2:46 PM 07/11/2021    2:48 PM  Weight /BMI  Weight 237 lb 1.9 oz 235 lb 12.8 oz 236 lb  Height 5\' 2"  (1.575 m) 5\' 3"  (1.6 m) 5\' 2"  (1.575 m)  BMI 43.37 kg/m2 41.77 kg/m2 43.16 kg/m2

## 2021-12-16 NOTE — Progress Notes (Signed)
Patricia Fuentes     MRN: [id number]      DOB: [birth year]   HPI Patricia Fuentes is here for follow up and re-evaluation of chronic medical conditions, medication management and review of any available recent lab and radiology data.  Preventive health is updated, specifically  Cancer screening and Immunization.   Questions or concerns regarding consultations or procedures which the PT has had in the interim are  addressed. The PT denies any adverse reactions to current medications since the last visit.   ROS Denies recent fever or chills. Denies sinus pressure, nasal congestion, ear pain or sore throat. Denies chest congestion, productive cough or wheezing. Denies chest pains, palpitations and leg swelling Denies abdominal pain, nausea, vomiting,diarrhea or constipation.   Denies dysuria, frequency, hesitancy or incontinence. Chronic debilitating right neck and upper ext pain Denies headaches, seizures, numbness, or tingling. Denies depression, anxiety or insomnia. Denies skin break down or rash.   PE  BP 119/71 (BP Location: Right Arm, Patient Position: Sitting, Cuff Size: Large)   Pulse 72   Ht 5\' 2"  (1.575 m)   Wt 237 lb 1.9 oz (107.6 kg)   SpO2 90%   BMI 43.37 kg/m   Patient alert and oriented and in no cardiopulmonary distress.  HEENT: No facial asymmetry, EOMI,     Neck decreased ROM with right trapezius spasm  Chest: Clear to auscultation bilaterally.  CVS: S1, S2 no murmurs, no S3.Regular rate.  ABD: Soft non tender.   Ext: No edema  MS: Adequate ROM spine, shoulders, hips and knees.  Skin: Intact, no ulcerations or rash noted.  Psych: Good eye contact, normal affect. Memory intact not anxious or depressed appearing.  CNS: CN 2-12 intact, power,  normal throughout.no focal deficits noted.   Assessment & Plan  Essential hypertension Controlled, no change in medication DASH diet and commitment to daily physical activity for a minimum of 30 minutes  discussed and encouraged, as a part of hypertension management. The importance of attaining a healthy weight is also discussed.     12/11/2021    2:52 PM 07/26/2021    2:46 PM 07/11/2021    2:48 PM 03/02/2021    3:48 PM 03/02/2021    3:10 PM 10/11/2020    3:24 PM 07/11/2020    3:41 PM  BP/Weight  Systolic BP 119 131 122 138 140 137 163  Diastolic BP 71 71 63 72 78 72 61  Wt. 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  The importance of healthy food choices with portion control discussed, as well as eating regularly and within a 12 hour window most days. The need to choose "clean , green" food 50 to 75% of the time is discussed, as well as to make water the primary drink and set a goal of 64 ounces water daily.       12/11/2021    2:52 PM 07/26/2021    2:46 PM 07/11/2021    2:48 PM  Weight /BMI  Weight 237 lb 1.9 oz 235  lb 12.8 oz 236 lb  Height 5\' 2"  (1.575 m) 5\' 3"  (1.6 m) 5\' 2"  (1.575 m)  BMI 43.37 kg/m2 41.77 kg/m2 43.16 kg/m2

## 2021-12-16 NOTE — Assessment & Plan Note (Signed)
Controlled, no change in medication DASH diet and commitment to daily physical activity for a minimum of 30 minutes discussed and encouraged, as a part of hypertension management. The importance of attaining a healthy weight is also discussed.     12/11/2021    2:52 PM 07/26/2021    2:46 PM 07/11/2021    2:48 PM 03/02/2021    3:48 PM 03/02/2021    3:10 PM 10/11/2020    3:24 PM 07/11/2020    3:41 PM  BP/Weight  Systolic BP 119 131 122 138 140 137 163  Diastolic BP 71 71 63 72 78 72 61  Wt. (Lbs) 237.12 235.8 236  242.12 244 248  BMI 43.37 kg/m2 41.77 kg/m2 43.16 kg/m2  44.28 kg/m2 44.63 kg/m2 45.36 kg/m2

## 2021-12-16 NOTE — Assessment & Plan Note (Addendum)
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

## 2021-12-24 DIAGNOSIS — I10 Essential (primary) hypertension: Secondary | ICD-10-CM

## 2021-12-24 DIAGNOSIS — E785 Hyperlipidemia, unspecified: Secondary | ICD-10-CM

## 2021-12-25 ENCOUNTER — Other Ambulatory Visit: Payer: Self-pay | Admitting: Family Medicine

## 2022-01-21 ENCOUNTER — Ambulatory Visit (HOSPITAL_COMMUNITY): Payer: Medicare HMO | Attending: Family Medicine | Admitting: Physical Therapy

## 2022-03-06 ENCOUNTER — Encounter: Payer: Medicare HMO | Admitting: Family Medicine

## 2022-03-06 ENCOUNTER — Encounter: Payer: Self-pay | Admitting: Family Medicine

## 2022-03-25 ENCOUNTER — Encounter (HOSPITAL_COMMUNITY): Payer: Self-pay

## 2022-03-25 ENCOUNTER — Emergency Department (HOSPITAL_COMMUNITY)
Admission: EM | Admit: 2022-03-25 | Discharge: 2022-03-25 | Disposition: A | Discharge status: Home / Self Care | Payer: Medicare HMO | Attending: Emergency Medicine | Admitting: Emergency Medicine

## 2022-03-25 ENCOUNTER — Other Ambulatory Visit: Payer: Self-pay

## 2022-03-25 ENCOUNTER — Emergency Department (HOSPITAL_COMMUNITY): Payer: Medicare HMO

## 2022-03-25 DIAGNOSIS — R051 Acute cough: Secondary | ICD-10-CM | POA: Diagnosis not present

## 2022-03-25 DIAGNOSIS — Z1152 Encounter for screening for COVID-19: Secondary | ICD-10-CM | POA: Diagnosis not present

## 2022-03-25 DIAGNOSIS — I10 Essential (primary) hypertension: Secondary | ICD-10-CM | POA: Insufficient documentation

## 2022-03-25 DIAGNOSIS — Z79899 Other long term (current) drug therapy: Secondary | ICD-10-CM | POA: Diagnosis not present

## 2022-03-25 DIAGNOSIS — R059 Cough, unspecified: Secondary | ICD-10-CM | POA: Diagnosis not present

## 2022-03-25 LAB — SARS CORONAVIRUS 2 BY RT PCR: SARS Coronavirus 2 by RT PCR: NEGATIVE

## 2022-03-25 MED ORDER — AZITHROMYCIN 200 MG/5ML PO SUSR: 250.0000 mg | Freq: Every day | ORAL | 0 refills | Status: AC

## 2022-03-25 MED ORDER — PREDNISOLONE 15 MG/5ML PO SOLN: 30.0000 mg | Freq: Every day | ORAL | 0 refills | Status: AC

## 2022-03-25 NOTE — ED Provider Notes (Signed)
[facility] EMERGENCY DEPARTMENT Provider Note   CSN: [id number] Arrival date & time: 03/25/22  1402     History  Chief Complaint  Patient presents with   Cough    Patricia Fuentes is a 76 y.o. female.   Cough Patient presents for cough.  Medical history includes bronchitis, chronic back pain, HTN, HLD, obesity, prediabetes, reactive airway disease.  Onset of cough was last night.  It has been productive of clear mucus.  She reports postnasal drainage.  She has not had any shortness of breath.  She does have breathing treatments at home but has not used any.  She did take some Robitussin this morning.  She states that she typically gets bronchitis at this time of year.  Previously, this has been treated with antibiotics and steroids.     Home Medications Prior to Admission medications   Medication Sig Start Date End Date Taking? Authorizing Provider  amLODipine (NORVASC) 10 MG tablet TAKE 1 TABLET BY MOUTH ONCE A DAY. 12/26/21  Yes Kerri Perches, MD  azithromycin (ZITHROMAX) 200 MG/5ML suspension Take 6.3 mLs (250 mg total) by mouth daily for 5 days. 03/25/22 03/30/22 Yes Gloris Manchester, MD  calcium-vitamin D (OSCAL-500) 500-400 MG-UNIT tablet Take 1 tablet by mouth 2 (two) times daily. 01/19/20  Yes Kerri Perches, MD  diclofenac Sodium (VOLTAREN) 1 % GEL Apply to neck 3 times weekly , as needed, for pain 12/11/21  Yes Kerri Perches, MD  guaifenesin (ROBITUSSIN) 100 MG/5ML syrup Take 200 mg by mouth 3 (three) times daily as needed for cough.   Yes [provider]  OXYGEN Inhale 2 L into the lungs at bedtime as needed.   Yes [provider]  prednisoLONE (PRELONE) 15 MG/5ML SOLN Take 10 mLs (30 mg total) by mouth daily before breakfast for 5 days. 03/25/22 03/30/22 Yes Gloris Manchester, MD      Allergies    Other and Promethazine hcl    Review of Systems   Review of Systems  HENT:  Positive for congestion.   Respiratory:  Positive for cough.   All  other systems reviewed and are negative.   Physical Exam Updated Vital Signs BP 135/60 (BP Location: Right Arm)   Pulse 62   Temp 98.5 F (36.9 C) (Oral)   Resp 17   Ht 5\' 2"  (1.575 m)   Wt 112.5 kg   SpO2 99%   BMI 45.36 kg/m  Physical Exam Vitals and nursing note reviewed.  Constitutional:      General: She is not in acute distress.    Appearance: Normal appearance. She is well-developed. She is not ill-appearing, toxic-appearing or diaphoretic.  HENT:     Head: Normocephalic and atraumatic.     Right Ear: External ear normal.     Left Ear: External ear normal.     Nose: Nose normal.     Mouth/Throat:     Mouth: Mucous membranes are moist.     Pharynx: Oropharynx is clear. No oropharyngeal exudate or posterior oropharyngeal erythema.  Eyes:     Extraocular Movements: Extraocular movements intact.     Conjunctiva/sclera: Conjunctivae normal.  Cardiovascular:     Rate and Rhythm: Normal rate and regular rhythm.     Heart sounds: No murmur heard. Pulmonary:     Effort: Pulmonary effort is normal. No respiratory distress.     Breath sounds: Normal breath sounds. No stridor. No wheezing, rhonchi or rales.  Chest:     Chest wall: No tenderness.  Abdominal:     General: There is no distension.     Palpations: Abdomen is soft.     Tenderness: There is no abdominal tenderness.  Musculoskeletal:        General: No swelling. Normal range of motion.     Cervical back: Normal range of motion and neck supple.     Right lower leg: No edema.     Left lower leg: No edema.  Skin:    General: Skin is warm and dry.     Capillary Refill: Capillary refill takes less than 2 seconds.     Coloration: Skin is not jaundiced or pale.  Neurological:     General: No focal deficit present.     Mental Status: She is alert and oriented to person, place, and time.     Cranial Nerves: No cranial nerve deficit.     Sensory: No sensory deficit.     Motor: No weakness.     Coordination:  Coordination normal.  Psychiatric:        Mood and Affect: Mood normal.        Behavior: Behavior normal.        Thought Content: Thought content normal.        Judgment: Judgment normal.     ED Results / Procedures / Treatments   Labs (all labs ordered are listed, but only abnormal results are displayed) Labs Reviewed  SARS CORONAVIRUS 2 BY RT PCR    EKG None  Radiology DG Chest 2 View  Result Date: 03/25/2022 CLINICAL DATA:  Cough EXAM: CHEST - 2 VIEW COMPARISON:  06/08/2018 FINDINGS: Transverse diameter of heart is increased. There are no signs of pulmonary edema or focal pulmonary consolidation. There is no significant pleural effusion or pneumothorax. IMPRESSION: Cardiomegaly. There are no signs of pulmonary edema or focal pulmonary consolidation. Electronically Signed   By: Elmer Picker M.D.   On: 03/25/2022 15:18    Procedures Procedures    Medications Ordered in ED Medications - No data to display  ED Course/ Medical Decision Making/ A&P                           Medical Decision Making Amount and/or Complexity of Data Reviewed Radiology: ordered.   Patient presents for cough and postnasal drainage.  Onset was last night.  This is typical of the yearly bronchitis that she gets around this time of year.  She denies any recent shortness of breath.  On arrival in the ED, vital signs are normal.  SPO2 is 99% on room air.  On exam, her breathing is unlabored.  Her lungs are clear to auscultation.  She has no oropharyngeal erythema or exudates.  Patient underwent chest x-ray without any acute findings.  COVID testing was negative.  Patient was advised to continue supportive care at home.  She was also given prescriptions for steroid and antibiotic if she does have any worsening of symptoms over the next several days.  She was discharged in good condition.        Final Clinical Impression(s) / ED Diagnoses Final diagnoses:  Acute cough    Rx / DC  Orders ED Discharge Orders          Ordered    prednisoLONE (PRELONE) 15 MG/5ML SOLN  Daily before breakfast        03/25/22 2040    azithromycin (ZITHROMAX) 200 MG/5ML suspension  Daily        03/25/22  2040              Gloris Manchester, MD 03/25/22 2040

## 2022-03-25 NOTE — Discharge Instructions (Addendum)
Continue to stay hydrated and treat your symptoms with over-the-counter medications over the next several days.  If you do develop worsening symptoms by the end of the week, there were prescriptions sent to your pharmacy for a steroid and an antibiotic.  This is for treatment of pneumonia and/or bronchitis.  Take this only as needed if you do have worsening symptoms.  Return to the emergency department at any time for any further concerns.

## 2022-03-25 NOTE — ED Triage Notes (Signed)
Pt presents to ED with complaints of cough since last night, pt states cough is worse at night and has had clear mucous.

## 2022-03-25 NOTE — ED Notes (Signed)
Patient ambulated to restroom.

## 2022-05-10 ENCOUNTER — Ambulatory Visit (INDEPENDENT_AMBULATORY_CARE_PROVIDER_SITE_OTHER): Payer: Medicare HMO | Admitting: Family Medicine

## 2022-05-10 ENCOUNTER — Encounter: Payer: Self-pay | Admitting: Family Medicine

## 2022-05-10 VITALS — BP 119/68 | HR 80 | Ht 62.0 in | Wt 239.1 lb

## 2022-05-10 DIAGNOSIS — H547 Unspecified visual loss: Secondary | ICD-10-CM

## 2022-05-10 DIAGNOSIS — G4733 Obstructive sleep apnea (adult) (pediatric): Secondary | ICD-10-CM | POA: Diagnosis not present

## 2022-05-10 DIAGNOSIS — Z0001 Encounter for general adult medical examination with abnormal findings: Secondary | ICD-10-CM | POA: Diagnosis not present

## 2022-05-10 DIAGNOSIS — Z1231 Encounter for screening mammogram for malignant neoplasm of breast: Secondary | ICD-10-CM

## 2022-05-10 DIAGNOSIS — Z23 Encounter for immunization: Secondary | ICD-10-CM | POA: Diagnosis not present

## 2022-05-10 NOTE — Progress Notes (Unsigned)
    Patricia Fuentes     MRN: [id number]      DOB: 1945-12-07  HPI: Patient is in for annual physical exam. No other health concerns are expressed or addressed at the visit. Recent labs,  are reviewed. Immunization is reviewed , and  updated if needed.   PE: Pleasant  female, alert and oriented x 3, in no cardio-pulmonary distress. Afebrile. HEENT No facial trauma or asymetry. Sinuses non tender.  Extra occullar muscles intact.. External ears normal, . Neck: supple, no adenopathy,JVD or thyromegaly.No bruits.  Chest: Clear to ascultation bilaterally.No crackles or wheezes. Non tender to palpation  Breast: No asymetry,no masses or lumps. No tenderness. No nipple discharge or inversion. No axillary or supraclavicular adenopathy  Cardiovascular system; Heart sounds normal,  S1 and  S2 ,no S3.  No murmur, or thrill. Apical beat not displaced Peripheral pulses normal.  Abdomen: Soft, non tender, no organomegaly or masses. No bruits. Bowel sounds normal. No guarding, tenderness or rebound.   GU: External genitalia normal female genitalia , normal female distribution of hair. No lesions. Urethral meatus normal in size, no  Prolapse, no lesions visibly  Present. Bladder non tender. Vagina pink and moist , with no visible lesions , discharge present . Adequate pelvic support no  cystocele or rectocele noted Cervix pink and appears healthy, no lesions or ulcerations noted, no discharge noted from os Uterus normal size, no adnexal masses, no cervical motion or adnexal tenderness.   Musculoskeletal exam: Full ROM of spine, hips , shoulders and knees. No deformity ,swelling or crepitus noted. No muscle wasting or atrophy.   Neurologic: Cranial nerves 2 to 12 intact. Power, tone ,sensation and reflexes normal throughout. No disturbance in gait. No tremor.  Skin: Intact, no ulceration, erythema , scaling or rash noted. Pigmentation normal throughout  Psych; Normal  mood and affect. Judgement and concentration normal   Assessment & Plan:  No problem-specific Assessment & Plan notes found for this encounter.

## 2022-05-10 NOTE — Patient Instructions (Signed)
F/u in 6 months, call if you need me sooner  Flu vaccine in office today.  Please get your COVID-vaccine and the RSV vaccine at your pharmacy.  I recommend you get them at separate times.  Following this your Tdap and your shingles vaccines are both due and you can get them at the pharmacy.  Please schedule mammogram at checkout.  Please start vitamin D3 2000 units once daily over-the-counter.  You are referred for  eye exam since you have poor vision in 1 eye.  You are referred for follow up  of your sleep apnea.   Continue to focus on healthy food choices and smaller portion sizes to improve health.  Vegetables foods lean white meat beans and eggs are healthy food choices.  Cut back on sweets sugary foods and white starchy foods like bread pasta and potatoes.  It is important that you exercise regularly at least 30 minutes 5 times a week. If you develop chest pain, have severe difficulty breathing, or feel very tired, stop exercising immediately and seek medical attention  Thanks for choosing [facility], we consider it a privelige to serve you.

## 2022-05-12 ENCOUNTER — Encounter: Payer: Self-pay | Admitting: Family Medicine

## 2022-05-12 DIAGNOSIS — Z0001 Encounter for general adult medical examination with abnormal findings: Secondary | ICD-10-CM | POA: Insufficient documentation

## 2022-05-12 NOTE — Assessment & Plan Note (Signed)

## 2022-05-16 ENCOUNTER — Ambulatory Visit (HOSPITAL_COMMUNITY)
Admission: RE | Admit: 2022-05-16 | Discharge: 2022-05-16 | Disposition: A | Discharge status: Home / Self Care | Payer: Medicare HMO | Source: Ambulatory Visit | Attending: Family Medicine | Admitting: Family Medicine

## 2022-05-16 DIAGNOSIS — Z1231 Encounter for screening mammogram for malignant neoplasm of breast: Secondary | ICD-10-CM | POA: Diagnosis not present

## 2022-06-17 ENCOUNTER — Other Ambulatory Visit: Payer: Self-pay | Admitting: Family Medicine

## 2022-07-10 ENCOUNTER — Institutional Professional Consult (permissible substitution): Payer: Medicare HMO | Admitting: Neurology

## 2022-08-13 ENCOUNTER — Telehealth: Payer: Self-pay | Admitting: Orthopedic Surgery

## 2022-08-13 NOTE — Telephone Encounter (Signed)
Returned the patient's call, she was wanting an appointment w/Dr. Aline Brochure for today.  LVM for her to call us back to schedule.

## 2022-10-03 ENCOUNTER — Ambulatory Visit (INDEPENDENT_AMBULATORY_CARE_PROVIDER_SITE_OTHER): Payer: Medicare HMO | Admitting: Family Medicine

## 2022-10-03 ENCOUNTER — Encounter: Payer: Self-pay | Admitting: Family Medicine

## 2022-10-03 VITALS — Ht 62.0 in | Wt 238.0 lb

## 2022-10-03 DIAGNOSIS — Z0000 Encounter for general adult medical examination without abnormal findings: Secondary | ICD-10-CM | POA: Diagnosis not present

## 2022-10-03 NOTE — Progress Notes (Signed)
Subjective:   Patricia Fuentes is a 77 y.o. female who presents for Medicare Annual (Subsequent) preventive examination.  I connected with  Patricia Fuentes on 10/03/22 by a audio enabled telemedicine application and verified that I am speaking with the correct person using two identifiers.  Patient Location: Home  Provider Location: Office/Clinic  I discussed the limitations of evaluation and management by telemedicine. The patient expressed understanding and agreed to proceed.   Review of Systems    Patient denies pain, fever, chills, chest pain, palpations ,shortness of breath, blurred vision,cough, abdominal pain, nausea, vomiting, headache, dizziness. Patient is not feeling nervous or anxious.         Objective:    There were no vitals filed for this visit. There is no height or weight on file to calculate BMI.     03/25/2022    2:56 PM 10/01/2021    1:35 PM 02/08/2021   10:38 AM 09/06/2020    3:08 PM 09/06/2019    1:25 PM 09/03/2018   10:18 AM 06/08/2018   10:33 PM  Advanced Directives  Does Patient Have a Medical Advance Directive? No No No No No No No  Would patient like information on creating a medical advance directive?  No - Patient declined Yes (MAU/Ambulatory/Procedural Areas - Information given) No - Patient declined Yes (ED - Information included in AVS)  Yes (Inpatient - patient requests chaplain consult to create a medical advance directive)    Current Medications (verified) Outpatient Encounter Medications as of 10/03/2022  Medication Sig   amLODipine (NORVASC) 10 MG tablet TAKE 1 TABLET BY MOUTH ONCE A DAY.   calcium-vitamin D (OSCAL-500) 500-400 MG-UNIT tablet Take 1 tablet by mouth 2 (two) times daily.   Cholecalciferol (VITAMIN D3) 50 MCG (2000 UT) capsule Take 1 capsule (2,000 Units total) by mouth daily.   diclofenac Sodium (VOLTAREN) 1 % GEL Apply to neck 3 times weekly , as needed, for pain   guaifenesin (ROBITUSSIN) 100 MG/5ML syrup Take 200 mg by  mouth 3 (three) times daily as needed for cough.   OXYGEN Inhale 2 L into the lungs at bedtime as needed.   No facility-administered encounter medications on file as of 10/03/2022.    Allergies (verified) Other and Promethazine hcl   History: Past Medical History:  Diagnosis Date   Bronchitis    Chronic back pain    Essential hypertension    Hyperlipidemia    IGT (impaired glucose tolerance) 2015   Obesity, unspecified    Osteoarthritis    Seasonal allergies    Past Surgical History:  Procedure Laterality Date   CARDIAC CATHETERIZATION N/A 10/25/2014   Procedure: Left Heart Cath and Coronary Angiography;  Surgeon: Kathleene Hazel, MD;  Location: [facility] INVASIVE CV LAB;  Service: Cardiovascular;  Laterality: N/A;   COLONOSCOPY N/A 02/09/2016   Procedure: COLONOSCOPY;  Surgeon: West Bali, MD;  Location: [facility] ENDO SUITE;  Service: Endoscopy;  Laterality: N/A;  12:30 PM   JOINT REPLACEMENT N/A    Phreesia 07/27/2020   KNEE ARTHROSCOPY Left    TOTAL ABDOMINAL HYSTERECTOMY  1995 approx   Fibroids   TOTAL KNEE ARTHROPLASTY Right 2011   TOTAL KNEE ARTHROPLASTY Left 04/29/2017   Procedure: TOTAL KNEE ARTHROPLASTY;  Surgeon: Vickki Hearing, MD;  Location: [facility] ORS;  Service: Orthopedics;  Laterality: Left;   Family History  Problem Relation Age of Onset   Hypertension Mother    Stroke Mother 78       Deceased due CVa  at 84   Diabetes Father    Cancer Father 45       Unknown, somewhere in stomach   Hypertension Brother    Hypertension Brother    Hypertension Brother    Cancer Maternal Grandfather        ?type   Hypertension Son    Social History   Socioeconomic History   Marital status: Married    Spouse name: Not on file   Number of children: Not on file   Years of education: Not on file   Highest education level: Not on file  Occupational History   Occupation: retired    Associate Professor: UNEMPLOYED  Tobacco Use   Smoking status: Never   Smokeless tobacco: Never   Vaping Use   Vaping Use: Never used  Substance and Sexual Activity   Alcohol use: No   Drug use: No   Sexual activity: Yes    Birth control/protection: Surgical  Other Topics Concern   Not on file  Social History Narrative   Not on file   Social Determinants of Health   Financial Resource Strain: Low Risk  (10/01/2021)   Overall Financial Resource Strain (CARDIA)    Difficulty of Paying Living Expenses: Not hard at all  Food Insecurity: No Food Insecurity (10/01/2021)   Hunger Vital Sign    Worried About Running Out of Food in the Last Year: Never true    Ran Out of Food in the Last Year: Never true  Transportation Needs: No Transportation Needs (10/01/2021)   PRAPARE - Administrator, Civil Service (Medical): No    Lack of Transportation (Non-Medical): No  Physical Activity: Inactive (10/01/2021)   Exercise Vital Sign    Days of Exercise per Week: 0 days    Minutes of Exercise per Session: 0 min  Stress: No Stress Concern Present (10/01/2021)   [employer] of Occupational Health - Occupational Stress Questionnaire    Feeling of Stress : Not at all  Social Connections: Socially Integrated (10/01/2021)   Social Connection and Isolation Panel [NHANES]    Frequency of Communication with Friends and Family: More than three times a week    Frequency of Social Gatherings with Friends and Family: More than three times a week    Attends Religious Services: More than 4 times per year    Active Member of [employer] or Organizations: Yes    Attends Engineer, structural: More than 4 times per year    Marital Status: Married    Tobacco Counseling Counseling given: Not Answered   Clinical Intake:                 Diabetic? No Hemoglobin A1c 5.7         Activities of Daily Living     No data to display          Patient Care Team: Kerri Perches, MD as PCP - General [place] Ottie Glazier, MD (Inactive) as PCP - Cardiology  (Cardiology) Vickki Hearing, MD as Consulting Physician (Orthopedic Surgery) West Bali, MD (Inactive) as Consulting Physician (Gastroenterology)  Indicate any recent Medical Services you may have received from other than [facility] providers in the past year (date may be approximate).     Assessment:   This is a routine wellness examination for [place].  Hearing/Vision screen No results found.  Dietary issues and exercise activities discussed:  Discussed DASH diet which includes vegetables,fruits,whole grains, fat free or low fat diary,fish,poultry,beans,nuts and seeds,vegetable oils. Find  an activity that you will enjoy and start to be active at least 5 days a week for 30 minutes each day.    Goals Addressed   None    Depression Screen    05/10/2022    2:27 PM 12/11/2021    2:53 PM 10/01/2021    1:36 PM 10/01/2021    1:33 PM 07/11/2021    2:49 PM 03/02/2021    3:12 PM 02/20/2021    9:07 AM  PHQ 2/9 Scores  PHQ - 2 Score 0 0 0 0 0 0 0    Fall Risk    05/10/2022    2:27 PM 12/11/2021    2:53 PM 10/01/2021    1:35 PM 07/11/2021    2:49 PM 03/02/2021    3:11 PM  Fall Risk   Falls in the past year? 0 0 0 0 0  Number falls in past yr: 0 0 0 0 0  Injury with Fall? 0 0 0 0 0  Risk for fall due to : No Fall Risks No Fall Risks No Fall Risks  No Fall Risks  Follow up Falls evaluation completed Falls evaluation completed Falls evaluation completed  Falls evaluation completed    FALL RISK PREVENTION PERTAINING TO THE HOME:  Any stairs in or around the home? No  If so, are there any without handrails? No  Home free of loose throw rugs in walkways, pet beds, electrical cords, etc? Yes  Adequate lighting in your home to reduce risk of falls? Yes   ASSISTIVE DEVICES UTILIZED TO PREVENT FALLS:  Life alert? No  Use of a cane, walker or w/c? No  Grab bars in the bathroom? Yes  Shower chair or bench in shower? No  Elevated toilet seat or a handicapped toilet? Yes     Cognitive  Function:    10/01/2021    1:37 PM  MMSE - Mini Mental State Exam  Not completed: Unable to complete        10/01/2021    1:37 PM 09/03/2018   10:21 AM 09/01/2017    3:08 PM 07/18/2016    3:44 PM  6CIT Screen  What Year? 0 points 0 points 0 points 0 points  What month? 0 points 0 points 0 points 0 points  What time? 0 points 0 points 0 points 0 points  Count back from 20 0 points 0 points 0 points 0 points  Months in reverse 0 points 0 points 0 points 0 points  Repeat phrase 0 points 2 points 0 points 0 points  Total Score 0 points 2 points 0 points 0 points    Immunizations Immunization History  Administered Date(s) Administered   Fluad Quad(high Dose 65+) 03/10/2019, 03/02/2021, 05/10/2022   Influenza, High Dose Seasonal PF 05/05/2018   Influenza,inj,Quad PF,6+ Mos 06/22/2013, 07/18/2014, 07/04/2015, 03/21/2016, 02/11/2017, 01/19/2020   Moderna Sars-Covid-2 Vaccination 06/19/2019, 07/19/2019, 04/13/2020   PPD Test 01/15/2018   Pneumococcal Conjugate-13 12/27/2013   Pneumococcal Polysaccharide-23 12/15/2012    TDAP status: Due, Education has been provided regarding the importance of this vaccine. Advised may receive this vaccine at local pharmacy or Health Dept. Aware to provide a copy of the vaccination record if obtained from local pharmacy or Health Dept. Verbalized acceptance and understanding.  Flu Vaccine status: Due, Education has been provided regarding the importance of this vaccine. Advised may receive this vaccine at local pharmacy or Health Dept. Aware to provide a copy of the vaccination record if obtained from local pharmacy or Health Dept. Verbalized  acceptance and understanding.  Pneumococcal vaccine status: Up to date  Covid-19 vaccine status: Information provided on how to obtain vaccines.   Qualifies for Shingles Vaccine? Yes   Zostavax completed No   Shingrix Completed?: No.    Education has been provided regarding the importance of this vaccine. Patient has  been advised to call insurance company to determine out of pocket expense if they have not yet received this vaccine. Advised may also receive vaccine at local pharmacy or Health Dept. Verbalized acceptance and understanding.  Screening Tests Health Maintenance  Topic Date Due   DTaP/Tdap/Td (1 - Tdap) Never done   Zoster Vaccines- Shingrix (1 of 2) Never done   COVID-19 Vaccine (4 - 2023-24 season) 01/25/2022   INFLUENZA VACCINE  12/26/2022   Medicare Annual Wellness (AWV)  05/13/2023   MAMMOGRAM  05/17/2023   COLONOSCOPY (Pts 45-13yrs Insurance coverage will need to be confirmed)  02/08/2026   Pneumonia Vaccine 73+ Years old  Completed   DEXA SCAN  Completed   Hepatitis C Screening  Completed   HPV VACCINES  Aged Out    Health Maintenance  Health Maintenance Due  Topic Date Due   DTaP/Tdap/Td (1 - Tdap) Never done   Zoster Vaccines- Shingrix (1 of 2) Never done   COVID-19 Vaccine (4 - 2023-24 season) 01/25/2022    Colorectal cancer screening: Type of screening: Colonoscopy. Completed 2017. Repeat every 10 years  Mammogram status: Completed 2023. Repeat every year  Bone Density status: Completed  . Results reflect: Bone density results: NORMAL. Repeat every   years.  Lung Cancer Screening: (Low Dose CT Chest recommended if Age 69-80 years, 30 pack-year currently smoking OR have quit w/in 15years.) does not qualify.   Lung Cancer Screening Referral: N/A  Additional Screening:  Hepatitis C Screening: does not qualify; Completed 2017 Negative  Vision Screening: Recommended annual ophthalmology exams for early detection of glaucoma and other disorders of the eye. Is the patient up to date with their annual eye exam?  No  Who is the provider or what is the name of the office in which the patient attends annual eye exams? "[facility]" Patient stated will make an appointment  If pt is not established with a provider, would they like to be referred to a provider to establish care?  No .   Dental Screening: Recommended annual dental exams for proper oral hygiene  Community Resource Referral / Chronic Care Management: CRR required this visit?  No   CCM required this visit?  No      Plan:     I have personally reviewed and noted the following in the patient's chart:   Medical and social history Use of alcohol, tobacco or illicit drugs  Current medications and supplements including opioid prescriptions. Patient is not currently taking opioid prescriptions. Functional ability and status Nutritional status Physical activity Advanced directives List of other physicians Hospitalizations, surgeries, and ER visits in previous 12 months Vitals Screenings to include cognitive, depression, and falls Referrals and appointments  In addition, I have reviewed and discussed with patient certain preventive protocols, quality metrics, and best practice recommendations. A written personalized care plan for preventive services as well as general preventive health recommendations were provided to patient.     [address] [place], [place]   10/03/2022

## 2022-10-07 ENCOUNTER — Emergency Department (HOSPITAL_COMMUNITY): Payer: Medicare HMO

## 2022-10-07 ENCOUNTER — Observation Stay (HOSPITAL_COMMUNITY)
Admission: EM | Admit: 2022-10-07 | Discharge: 2022-10-09 | Disposition: A | Discharge status: Home / Self Care | Payer: Medicare HMO | Attending: Internal Medicine | Admitting: Internal Medicine

## 2022-10-07 ENCOUNTER — Encounter (HOSPITAL_COMMUNITY): Payer: Self-pay | Admitting: Internal Medicine

## 2022-10-07 ENCOUNTER — Other Ambulatory Visit: Payer: Self-pay

## 2022-10-07 DIAGNOSIS — J9610 Chronic respiratory failure, unspecified whether with hypoxia or hypercapnia: Secondary | ICD-10-CM | POA: Diagnosis present

## 2022-10-07 DIAGNOSIS — D696 Thrombocytopenia, unspecified: Secondary | ICD-10-CM | POA: Insufficient documentation

## 2022-10-07 DIAGNOSIS — Z96653 Presence of artificial knee joint, bilateral: Secondary | ICD-10-CM | POA: Insufficient documentation

## 2022-10-07 DIAGNOSIS — G4733 Obstructive sleep apnea (adult) (pediatric): Secondary | ICD-10-CM | POA: Diagnosis not present

## 2022-10-07 DIAGNOSIS — D72819 Decreased white blood cell count, unspecified: Secondary | ICD-10-CM | POA: Diagnosis not present

## 2022-10-07 DIAGNOSIS — E662 Morbid (severe) obesity with alveolar hypoventilation: Secondary | ICD-10-CM | POA: Diagnosis present

## 2022-10-07 DIAGNOSIS — J9601 Acute respiratory failure with hypoxia: Secondary | ICD-10-CM

## 2022-10-07 DIAGNOSIS — I10 Essential (primary) hypertension: Secondary | ICD-10-CM | POA: Diagnosis not present

## 2022-10-07 DIAGNOSIS — J9621 Acute and chronic respiratory failure with hypoxia: Principal | ICD-10-CM | POA: Insufficient documentation

## 2022-10-07 DIAGNOSIS — R0602 Shortness of breath: Secondary | ICD-10-CM | POA: Diagnosis not present

## 2022-10-07 DIAGNOSIS — Z1152 Encounter for screening for COVID-19: Secondary | ICD-10-CM | POA: Insufficient documentation

## 2022-10-07 DIAGNOSIS — G4734 Idiopathic sleep related nonobstructive alveolar hypoventilation: Secondary | ICD-10-CM | POA: Diagnosis present

## 2022-10-07 DIAGNOSIS — E876 Hypokalemia: Secondary | ICD-10-CM | POA: Diagnosis not present

## 2022-10-07 DIAGNOSIS — R059 Cough, unspecified: Secondary | ICD-10-CM | POA: Diagnosis not present

## 2022-10-07 DIAGNOSIS — R0600 Dyspnea, unspecified: Secondary | ICD-10-CM

## 2022-10-07 DIAGNOSIS — Z79899 Other long term (current) drug therapy: Secondary | ICD-10-CM | POA: Insufficient documentation

## 2022-10-07 DIAGNOSIS — R0902 Hypoxemia: Secondary | ICD-10-CM | POA: Diagnosis not present

## 2022-10-07 LAB — URINALYSIS, ROUTINE W REFLEX MICROSCOPIC
Bilirubin Urine: NEGATIVE
Glucose, UA: NEGATIVE mg/dL
Ketones, ur: NEGATIVE mg/dL
Leukocytes,Ua: NEGATIVE
Nitrite: NEGATIVE
Protein, ur: NEGATIVE mg/dL
Specific Gravity, Urine: 1.013 (ref 1.005–1.030)
pH: 6 (ref 5.0–8.0)

## 2022-10-07 LAB — CBC WITH DIFFERENTIAL/PLATELET
Abs Immature Granulocytes: 0.01 10*3/uL (ref 0.00–0.07)
Basophils Absolute: 0 10*3/uL (ref 0.0–0.1)
Basophils Relative: 1 %
Eosinophils Absolute: 0.3 10*3/uL (ref 0.0–0.5)
Eosinophils Relative: 6 %
HCT: 36.5 % (ref 36.0–46.0)
Hemoglobin: 11.6 g/dL — ABNORMAL LOW (ref 12.0–15.0)
Immature Granulocytes: 0 %
Lymphocytes Relative: 27 %
Lymphs Abs: 1.2 10*3/uL (ref 0.7–4.0)
MCH: 28.5 pg (ref 26.0–34.0)
MCHC: 31.8 g/dL (ref 30.0–36.0)
MCV: 89.7 fL (ref 80.0–100.0)
Monocytes Absolute: 1.2 10*3/uL — ABNORMAL HIGH (ref 0.1–1.0)
Monocytes Relative: 27 %
Neutro Abs: 1.7 10*3/uL (ref 1.7–7.7)
Neutrophils Relative %: 39 %
Platelets: 148 10*3/uL — ABNORMAL LOW (ref 150–400)
RBC: 4.07 MIL/uL (ref 3.87–5.11)
RDW: 13.9 % (ref 11.5–15.5)
WBC: 4.5 10*3/uL (ref 4.0–10.5)
nRBC: 0 % (ref 0.0–0.2)

## 2022-10-07 LAB — BASIC METABOLIC PANEL
Anion gap: 8 (ref 5–15)
BUN: 8 mg/dL (ref 8–23)
CO2: 33 mmol/L — ABNORMAL HIGH (ref 22–32)
Calcium: 8.1 mg/dL — ABNORMAL LOW (ref 8.9–10.3)
Chloride: 97 mmol/L — ABNORMAL LOW (ref 98–111)
Creatinine, Ser: 0.73 mg/dL (ref 0.44–1.00)
GFR, Estimated: >60 mL/min (ref >60)
Glucose, Bld: 81 mg/dL (ref 70–99)
Potassium: 2.7 mmol/L — CL (ref 3.5–5.1)
Sodium: 138 mmol/L (ref 135–145)

## 2022-10-07 LAB — RESP PANEL BY RT-PCR (RSV, FLU A&B, COVID)  RVPGX2
Influenza A by PCR: NEGATIVE
Influenza B by PCR: NEGATIVE
Resp Syncytial Virus by PCR: NEGATIVE
SARS Coronavirus 2 by RT PCR: NEGATIVE

## 2022-10-07 LAB — MAGNESIUM: Magnesium: 1.9 mg/dL (ref 1.7–2.4)

## 2022-10-07 LAB — BRAIN NATRIURETIC PEPTIDE: B Natriuretic Peptide: 31 pg/mL (ref 0.0–100.0)

## 2022-10-07 MED ORDER — IOHEXOL 350 MG/ML SOLN
80.0000 mL | Freq: Once | INTRAVENOUS | Status: AC | PRN
  Administered 2022-10-07: 80 mL via INTRAVENOUS

## 2022-10-07 MED ORDER — POTASSIUM CHLORIDE 10 MEQ/100ML IV SOLN
10.0000 meq | INTRAVENOUS | Status: AC
  Administered 2022-10-07 (×2): 10 meq via INTRAVENOUS
  Filled 2022-10-07 (×2): qty 100

## 2022-10-07 MED ORDER — ACETAMINOPHEN 325 MG PO TABS: 650.0000 mg | ORAL_TABLET | Freq: Four times a day (QID) | ORAL | Status: DC | PRN

## 2022-10-07 MED ORDER — POTASSIUM CHLORIDE 10 MEQ/100ML IV SOLN
10.0000 meq | Freq: Once | INTRAVENOUS | Status: AC
  Administered 2022-10-07: 10 meq via INTRAVENOUS
  Filled 2022-10-07: qty 100

## 2022-10-07 MED ORDER — IPRATROPIUM-ALBUTEROL 0.5-2.5 (3) MG/3ML IN SOLN
3.0000 mL | Freq: Once | RESPIRATORY_TRACT | Status: AC
  Administered 2022-10-07: 3 mL via RESPIRATORY_TRACT
  Filled 2022-10-07: qty 3

## 2022-10-07 MED ORDER — ORAL CARE MOUTH RINSE: 15.0000 mL | OROMUCOSAL | Status: DC | PRN

## 2022-10-07 MED ORDER — POTASSIUM CHLORIDE CRYS ER 20 MEQ PO TBCR
40.0000 meq | EXTENDED_RELEASE_TABLET | Freq: Once | ORAL | Status: AC
  Administered 2022-10-07: 40 meq via ORAL
  Filled 2022-10-07: qty 2

## 2022-10-07 MED ORDER — POTASSIUM CHLORIDE CRYS ER 20 MEQ PO TBCR
40.0000 meq | EXTENDED_RELEASE_TABLET | ORAL | Status: AC
  Administered 2022-10-07 – 2022-10-08 (×2): 40 meq via ORAL
  Filled 2022-10-07 (×2): qty 2

## 2022-10-07 MED ORDER — ENOXAPARIN SODIUM 40 MG/0.4ML IJ SOSY
40.0000 mg | PREFILLED_SYRINGE | INTRAMUSCULAR | Status: DC
  Administered 2022-10-07 – 2022-10-08 (×2): 40 mg via SUBCUTANEOUS
  Filled 2022-10-07 (×2): qty 0.4

## 2022-10-07 MED ORDER — AMLODIPINE BESYLATE 5 MG PO TABS
10.0000 mg | ORAL_TABLET | Freq: Every day | ORAL | Status: DC
  Administered 2022-10-08 – 2022-10-09 (×2): 10 mg via ORAL
  Filled 2022-10-07 (×2): qty 2

## 2022-10-07 MED ORDER — GUAIFENESIN-DM 100-10 MG/5ML PO SYRP
15.0000 mL | ORAL_SOLUTION | Freq: Three times a day (TID) | ORAL | Status: AC
  Administered 2022-10-07 – 2022-10-08 (×3): 15 mL via ORAL
  Filled 2022-10-07 (×3): qty 15

## 2022-10-07 MED ORDER — ONDANSETRON HCL 4 MG/2ML IJ SOLN: 4.0000 mg | Freq: Four times a day (QID) | INTRAMUSCULAR | Status: DC | PRN

## 2022-10-07 MED ORDER — ACETAMINOPHEN 650 MG RE SUPP: 650.0000 mg | Freq: Four times a day (QID) | RECTAL | Status: DC | PRN

## 2022-10-07 MED ORDER — METHYLPREDNISOLONE SODIUM SUCC 125 MG IJ SOLR
60.0000 mg | Freq: Two times a day (BID) | INTRAMUSCULAR | Status: DC
  Administered 2022-10-08: 60 mg via INTRAVENOUS
  Filled 2022-10-07: qty 2

## 2022-10-07 MED ORDER — IOHEXOL 300 MG/ML  SOLN: 75.0000 mL | Freq: Once | INTRAMUSCULAR | Status: DC | PRN

## 2022-10-07 MED ORDER — FUROSEMIDE 10 MG/ML IJ SOLN
40.0000 mg | Freq: Once | INTRAMUSCULAR | Status: AC
  Administered 2022-10-07: 40 mg via INTRAVENOUS
  Filled 2022-10-07: qty 4

## 2022-10-07 MED ORDER — IPRATROPIUM-ALBUTEROL 0.5-2.5 (3) MG/3ML IN SOLN
3.0000 mL | Freq: Three times a day (TID) | RESPIRATORY_TRACT | Status: DC
  Administered 2022-10-07 – 2022-10-08 (×2): 3 mL via RESPIRATORY_TRACT
  Filled 2022-10-07 (×3): qty 3

## 2022-10-07 MED ORDER — POLYETHYLENE GLYCOL 3350 17 G PO PACK: 17.0000 g | PACK | Freq: Every day | ORAL | Status: DC | PRN

## 2022-10-07 MED ORDER — ONDANSETRON HCL 4 MG PO TABS: 4.0000 mg | ORAL_TABLET | Freq: Four times a day (QID) | ORAL | Status: DC | PRN

## 2022-10-07 MED ORDER — METHYLPREDNISOLONE SODIUM SUCC 125 MG IJ SOLR
125.0000 mg | Freq: Once | INTRAMUSCULAR | Status: AC
  Administered 2022-10-07: 125 mg via INTRAVENOUS
  Filled 2022-10-07: qty 2

## 2022-10-07 MED ORDER — ALBUTEROL SULFATE (2.5 MG/3ML) 0.083% IN NEBU: 2.5000 mg | INHALATION_SOLUTION | RESPIRATORY_TRACT | Status: DC | PRN

## 2022-10-07 NOTE — Plan of Care (Signed)

## 2022-10-07 NOTE — ED Provider Notes (Signed)
Signout from Dr. Particia Nearing.  77 year old female who uses home oxygen at night here with dizziness and hypoxia.  She is pending CT angio chest and trending pulse ox.  Disposition per results of testing. Physical Exam  BP (!) 143/64   Pulse 91   Temp 98.7 F (37.1 C) (Oral)   Resp 20   Ht 5\' 2"  (1.575 m)   Wt 108 kg   SpO2 100%   BMI 43.55 kg/m   Physical Exam  Procedures  Procedures  ED Course / MDM    Medical Decision Making Amount and/or Complexity of Data Reviewed Labs: ordered. Radiology: ordered.  Risk Prescription drug management.   CT angio chest does not show any PE or other acute findings.  She has some stable findings of some pulmonary hypertension and atherosclerosis, nodules.   Nurse did a trending pulse ox with patient off of oxygen and she dropped to 74% on room air.  Was placed back on nasal cannula.  She tells me she uses oxygen at night for sleep apnea but has not needed it during the day.  She said maybe twice a year when she gets bronchitis she will need to use the oxygen but she has not had any of those type of symptoms recently.  Discussed with [facility] hospitalist Dr. Mariea Clonts who will evaluate patient for admission.       Terrilee Files, MD 10/08/22 [phone]

## 2022-10-07 NOTE — ED Triage Notes (Signed)
Pt reports productive cough, and lightheadedness since yesterday, states that her oxygen levels have dropped to 88% while sitting at work today. No SOB.

## 2022-10-07 NOTE — Assessment & Plan Note (Signed)
Potassium 2.7.  Denies GI losses, reports good oral intake, not on possible culprit medications.  Potassium 1.9. -Replete K - IV and oral

## 2022-10-07 NOTE — H&P (Addendum)
History and Physical    Patricia Fuentes:[id number] DOB: 1945-10-04 DOA: 10/07/2022  PCP: Kerri Perches, MD   Patient coming from: Home  I have personally briefly reviewed patient's old medical records in [facility]  Chief Complaint: Difficulty breathing  HPI: Patricia Fuentes is a 77 y.o. female with medical history significant for  OSA/OHS, chronic respiratory failure on 2 L at night,, hypertension. Patient presented to the ED with complaints of productive cough that started 4 days ago, with lightheadedness and wheezing.  No chest pain.  No lower extremity swelling.  She has an O2 sat monitor at home.  She checked her O2 sats at work today and it was 88% on room air.  Normally her O2 Sats are WNL during the day.  Patient is supposed to be on CPAP at night but she does not use it, she is on 2 L at night.  She denies diagnosis of asthma.  Never smoked cigarettes.  Reports prior episodes of bronchitis with low O2 sats in the past. . Multiple episodes of vomiting with coughing over the past week. No loose stools, and she has maintained good oral intake.  She is not on diuretics.  She reports lower abdominal pressure voiding, and urinary frequency.  ED Course: O2 sats 89% on room air, with ambulation sats dropped to 74%.  Blood pressure 97-144 systolic.  Later febrile to 100.2.  Potassium 2.7.  Magnesium 1.9.   Chest x-ray showed mild pulmonary vascular congestion, subsequent CTA chest negative for PE, suggested some degree of underlying pulmonary hypertension. Lasix 40 mg x 1 given.  DuoNebs, Solu-Medrol 125 mg given.  K-Dur supplementation started. [facility] admit for acute on chronic respiratory failure with hypoxia.  Review of Systems: As per HPI all other systems reviewed and negative.  Past Medical History:  Diagnosis Date   Bronchitis    Chronic back pain    Essential hypertension    Hyperlipidemia    IGT (impaired glucose tolerance) 2015   Obesity, unspecified     Osteoarthritis    Seasonal allergies     Past Surgical History:  Procedure Laterality Date   CARDIAC CATHETERIZATION N/A 10/25/2014   Procedure: Left Heart Cath and Coronary Angiography;  Surgeon: Kathleene Hazel, MD;  Location: [facility] INVASIVE CV LAB;  Service: Cardiovascular;  Laterality: N/A;   COLONOSCOPY N/A 02/09/2016   Procedure: COLONOSCOPY;  Surgeon: West Bali, MD;  Location: [facility] ENDO SUITE;  Service: Endoscopy;  Laterality: N/A;  12:30 PM   JOINT REPLACEMENT N/A    Phreesia 07/27/2020   KNEE ARTHROSCOPY Left    TOTAL ABDOMINAL HYSTERECTOMY  1995 approx   Fibroids   TOTAL KNEE ARTHROPLASTY Right 2011   TOTAL KNEE ARTHROPLASTY Left 04/29/2017   Procedure: TOTAL KNEE ARTHROPLASTY;  Surgeon: Vickki Hearing, MD;  Location: [facility] ORS;  Service: Orthopedics;  Laterality: Left;     reports that she has never smoked. She has never used smokeless tobacco. She reports that she does not drink alcohol and does not use drugs.  Allergies  Allergen Reactions   Other    Promethazine Hcl Nausea And Vomiting and Other (See Comments)    dizzy    Family History  Problem Relation Age of Onset   Hypertension Mother    Stroke Mother 49       Deceased due CVa at 24   Diabetes Father    Cancer Father 94       Unknown, somewhere in stomach   Hypertension  Brother    Hypertension Brother    Hypertension Brother    Cancer Maternal Grandfather        ?type   Hypertension Son     Prior to Admission medications   Medication Sig Start Date End Date Taking? Authorizing Provider  amLODipine (NORVASC) 10 MG tablet TAKE 1 TABLET BY MOUTH ONCE A DAY. 06/18/22  Yes Kerri Perches, MD  Cholecalciferol (VITAMIN D3) 50 MCG (2000 UT) capsule Take 1 capsule (2,000 Units total) by mouth daily. 05/10/22  Yes Kerri Perches, MD  diclofenac Sodium (VOLTAREN) 1 % GEL Apply to neck 3 times weekly , as needed, for pain 12/11/21  Yes Kerri Perches, MD  guaifenesin (ROBITUSSIN) 100  MG/5ML syrup Take 200 mg by mouth 3 (three) times daily as needed for cough.   Yes [provider]  naproxen sodium (ALEVE) 220 MG tablet Take 220 mg by mouth daily as needed (pain).   Yes [provider]  OXYGEN Inhale 2 L into the lungs at bedtime as needed.   Yes [provider]    Physical Exam: Vitals:   10/07/22 1430 10/07/22 1544 10/07/22 1730 10/07/22 1745  BP: 126/62 (!) 143/64 97/64   Pulse: 80 91    Resp: 19 20  14   Temp:      TempSrc:      SpO2: 100% 100%    Weight:      Height:        Constitutional: NAD, calm, comfortable Vitals:   10/07/22 1430 10/07/22 1544 10/07/22 1730 10/07/22 1745  BP: 126/62 (!) 143/64 97/64   Pulse: 80 91    Resp: 19 20  14   Temp:      TempSrc:      SpO2: 100% 100%    Weight:      Height:       Eyes: PERRL, lids and conjunctivae normal ENMT: Mucous membranes are moist.   Neck: normal, supple, no masses, no thyromegaly Respiratory: clear to auscultation bilaterally, no wheezing, no crackles. Normal respiratory effort. No accessory muscle use.  Cardiovascular: Regular rate and rhythm, no murmurs / rubs / gallops. No extremity edema.  Extremities warm   Abdomen: no tenderness, no masses palpated. No hepatosplenomegaly. Bowel sounds positive.  Musculoskeletal: no clubbing / cyanosis. No joint deformity upper and lower extremities.  Skin: no rashes, lesions, ulcers. No induration Neurologic: No facial asymmetry, moving extremities spontaneously, speech fluent. Psychiatric: Normal judgment and insight. Alert and oriented x 3. Normal mood.   Labs on Admission: I have personally reviewed following labs and imaging studies  CBC: Recent Labs  Lab 10/07/22 1428  WBC 4.5  NEUTROABS 1.7  HGB 11.6*  HCT 36.5  MCV 89.7  PLT 148*   Basic Metabolic Panel: Recent Labs  Lab 10/07/22 1428  NA 138  K 2.7*  CL 97*  CO2 33*  GLUCOSE 81  BUN 8  CREATININE 0.73  CALCIUM 8.1*  MG 1.9      Radiological Exams  on Admission: CT Angio Chest PE W and/or Wo Contrast  Result Date: 10/07/2022 CLINICAL DATA:  Cough, lightheadedness and hypoxia. EXAM: CT ANGIOGRAPHY CHEST WITH CONTRAST TECHNIQUE: Multidetector CT imaging of the chest was performed using the standard protocol during bolus administration of intravenous contrast. Multiplanar CT image reconstructions and MIPs were obtained to evaluate the vascular anatomy. RADIATION DOSE REDUCTION: This exam was performed according to the departmental dose-optimization program which includes automated exposure control, adjustment of the mA and/or kV according to patient  size and/or use of iterative reconstruction technique. CONTRAST:  80mL OMNIPAQUE IOHEXOL 350 MG/ML SOLN COMPARISON:  Prior CTA of the chest on 07/09/2017 FINDINGS: Cardiovascular: The pulmonary arteries are adequately opacified. There is no evidence of pulmonary embolism. Central pulmonary arteries demonstrates stable mild dilatation with the main pulmonary artery measuring approximately 3.3 cm. The thoracic aorta is normal in caliber. Stable mild atherosclerosis of the thoracic aorta. Stable top-normal heart size. No pericardial fluid identified. No visualized calcified coronary artery plaque. Mediastinum/Nodes: No enlarged mediastinal, hilar, or axillary lymph nodes. Thyroid gland, trachea, and esophagus demonstrate no significant findings. Lungs/Pleura: Scarring in the lingula. The previously noted lateral subpleural nodule of the right upper lobe is no longer present. Stable 4 mm fissural nodule in the right minor fissure likely represents an intrapulmonary lymph node and is stable since 2019. Upper Abdomen: No acute abnormality. Musculoskeletal: No chest wall abnormality. No acute or significant osseous findings. Review of the MIP images confirms the above findings. IMPRESSION: 1. No evidence of pulmonary embolism or other acute findings in the chest. 2. Stable mild dilatation of the central pulmonary arteries  which may be consistent with some degree of underlying pulmonary hypertension. 3. Stable 4 mm fissural nodule in the right minor fissure likely represents an intrapulmonary lymph node and is stable since 2019. The previously noted lateral subpleural nodule of the right upper lobe is no longer present. 4. Stable mild atherosclerosis of the thoracic aorta. 5. Stable top-normal heart size. 6. Scarring in the lingula. Aortic Atherosclerosis (ICD10-I70.0). Electronically Signed   By: Irish Lack M.D.   On: 10/07/2022 16:59   DG Chest Port 1 View  Result Date: 10/07/2022 CLINICAL DATA:  Shortness of breath. Productive cough. Lightheadedness. Hypoxia. EXAM: PORTABLE CHEST 1 VIEW COMPARISON:  Two-view chest x-ray 03/25/2022 FINDINGS: The heart is enlarged, stable. Atherosclerotic changes are present at the aortic arch. Mild pulmonary vascular congestion is present without frank edema. No definite effusions are present. No focal airspace disease is present. Degenerative changes are again noted at the shoulders bilaterally. IMPRESSION: Cardiomegaly and mild pulmonary vascular congestion without frank edema. Electronically Signed   By: Marin Roberts M.D.   On: 10/07/2022 14:46    EKG: Independently reviewed.  Sinus rhythm, rate 81, QTc 443.  No significant change from prior.  Assessment/Plan Principal Problem:   Acute on chronic hypoxic respiratory failure (HCC) Active Problems:   Hypokalemia   Obesity hypoventilation syndrome (HCC)   Essential hypertension   OSA on CPAP   Sleep-related hypoxia   Assessment and Plan: * Acute on chronic hypoxic respiratory failure (HCC) O2 sats 89% on room air, dropped to 74% with ambulation.  CTA chest negative for PE, suggest some degree of pulmonary hypertension.  Presenting with cough, lightheadedness, low-grade temp of 100.2. and Initial rhonchi on exam.  On my evaluation she status post DuoNeb and Solu-Medrol IV, and lungs are without added sounds on  auscultation.  Likely acute bronchitis as etiology with the, OHS/OSA.  Never smoked cigarettes.  Negative Respiratory virus panel.  -125 mg IV Solu-Medrol given, continue 60 twice daily -DuoNebs as needed and scheduled -Mucolytic's -CBGs twice daily while on steroids -Supplimental O2 -IV Lasix 40 mg Given in ED with Initial Chest X-Ray Findings of Pulmonary Vascular Congestion, Hold off on Further Diuretics -Low-grade temp 100.2, not meeting sepsis criteria. - Hold off on antibiotics for now -Reports nocturia, but UA not suggesting UTI  Hypokalemia Potassium 2.7.  Denies GI losses, reports good oral intake, not on possible culprit medications.  Potassium 1.9. -Replete K - IV and oral  Essential hypertension Stable. -Resume Norvasc 10 mg in a.m.  Obesity hypoventilation syndrome (HCC) History of OHS and OSA.  She is supposed to be on CPAP but does not use it.  She is on 2 L of O2 at night only.   DVT prophylaxis: Lovenox Code Status: Full Code Family Communication: None at bedside Disposition Plan: ~ 1- 2 days Consults called: None  Admission status:  Obs tele   Author: Onnie Boer, MD 10/07/2022 7:06 PM  For on call review [email or web address].

## 2022-10-07 NOTE — Assessment & Plan Note (Signed)
History of OHS and OSA.  She is supposed to be on CPAP but does not use it.  She is on 2 L of O2 at night only.

## 2022-10-07 NOTE — Assessment & Plan Note (Addendum)
O2 sats 89% on room air, dropped to 74% with ambulation.  CTA chest negative for PE, suggest some degree of pulmonary hypertension.  Presenting with cough, lightheadedness, low-grade temp of 100.2. and Initial rhonchi on exam.  On my evaluation she status post DuoNeb and Solu-Medrol IV, and lungs are without added sounds on auscultation.  Likely acute bronchitis as etiology with the, OHS/OSA.  Never smoked cigarettes.  Negative Respiratory virus panel.  -125 mg IV Solu-Medrol given, continue 60 twice daily -DuoNebs as needed and scheduled -Mucolytic's -CBGs twice daily while on steroids -Supplimental O2 -IV Lasix 40 mg Given in ED with Initial Chest X-Ray Findings of Pulmonary Vascular Congestion, Hold off on Further Diuretics -Low-grade temp 100.2, not meeting sepsis criteria. - Hold off on antibiotics for now -Reports nocturia, but UA not suggesting UTI

## 2022-10-07 NOTE — ED Notes (Signed)
MD made aware of pts low grade temp

## 2022-10-07 NOTE — Assessment & Plan Note (Signed)
Stable. -Resume Norvasc 10 mg in a.m.

## 2022-10-07 NOTE — ED Notes (Addendum)
Pt ambulated, O2 level dropped to 74% on RA.

## 2022-10-07 NOTE — ED Provider Notes (Signed)
[place] EMERGENCY DEPARTMENT AT [facility] Provider Note   CSN: [id number] Arrival date & time: 10/07/22  1237     History  Chief Complaint  Patient presents with   Cough   Hypoxia    Patricia Fuentes is a 77 y.o. female.  Pt is a 77 yo with pmhx significant for chronic pain, htn, hld, and arthritis.  Pt said she wears oxygen at night and has an O2 monitor.  She said she was at work and felt dizzy.  She checked her oxygen and it was 88%.  Pt said she has had a cough.         Home Medications Prior to Admission medications   Medication Sig Start Date End Date Taking? Authorizing Provider  amLODipine (NORVASC) 10 MG tablet TAKE 1 TABLET BY MOUTH ONCE A DAY. 06/18/22  Yes Kerri Perches, MD  Cholecalciferol (VITAMIN D3) 50 MCG (2000 UT) capsule Take 1 capsule (2,000 Units total) by mouth daily. 05/10/22  Yes Kerri Perches, MD  diclofenac Sodium (VOLTAREN) 1 % GEL Apply to neck 3 times weekly , as needed, for pain 12/11/21  Yes Kerri Perches, MD  guaifenesin (ROBITUSSIN) 100 MG/5ML syrup Take 200 mg by mouth 3 (three) times daily as needed for cough.   Yes [provider]  naproxen sodium (ALEVE) 220 MG tablet Take 220 mg by mouth daily as needed (pain).   Yes [provider]  OXYGEN Inhale 2 L into the lungs at bedtime as needed.   Yes [provider]      Allergies    Other and Promethazine hcl    Review of Systems   Review of Systems  Respiratory:  Positive for shortness of breath.   All other systems reviewed and are negative.   Physical Exam Updated Vital Signs BP (!) 143/64   Pulse 91   Temp 98.7 F (37.1 C) (Oral)   Resp 20   Ht 5\' 2"  (1.575 m)   Wt 108 kg   SpO2 100%   BMI 43.55 kg/m  Physical Exam Vitals and nursing note reviewed.  Constitutional:      Appearance: Normal appearance. She is obese.  HENT:     Head: Normocephalic and atraumatic.     Right Ear: External ear normal.     Left Ear:  External ear normal.     Nose: Nose normal.     Mouth/Throat:     Mouth: Mucous membranes are moist.     Pharynx: Oropharynx is clear.  Eyes:     Extraocular Movements: Extraocular movements intact.     Conjunctiva/sclera: Conjunctivae normal.     Pupils: Pupils are equal, round, and reactive to light.  Cardiovascular:     Rate and Rhythm: Normal rate and regular rhythm.     Pulses: Normal pulses.     Heart sounds: Normal heart sounds.  Pulmonary:     Breath sounds: Rhonchi present.  Abdominal:     General: Abdomen is flat. Bowel sounds are normal.     Palpations: Abdomen is soft.  Musculoskeletal:        General: Normal range of motion.     Cervical back: Normal range of motion and neck supple.  Skin:    General: Skin is warm.     Capillary Refill: Capillary refill takes less than 2 seconds.  Neurological:     General: No focal deficit present.     Mental Status: She is alert and oriented to person,  place, and time.  Psychiatric:        Mood and Affect: Mood normal.        Behavior: Behavior normal.     ED Results / Procedures / Treatments   Labs (all labs ordered are listed, but only abnormal results are displayed) Labs Reviewed  BASIC METABOLIC PANEL - Abnormal; Notable for the following components:      Result Value   Potassium 2.7 (*)    Chloride 97 (*)    CO2 33 (*)    Calcium 8.1 (*)    All other components within normal limits  CBC WITH DIFFERENTIAL/PLATELET - Abnormal; Notable for the following components:   Hemoglobin 11.6 (*)    Platelets 148 (*)    Monocytes Absolute 1.2 (*)    All other components within normal limits  RESP PANEL BY RT-PCR (RSV, FLU A&B, COVID)  RVPGX2  BRAIN NATRIURETIC PEPTIDE  MAGNESIUM    EKG EKG Interpretation  Date/Time:  Monday Oct 07 2022 13:35:46 EDT Ventricular Rate:  81 PR Interval:  138 QRS Duration: 88 QT Interval:  382 QTC Calculation: 443 R Axis:   130 Text Interpretation: Normal sinus rhythm Right axis  deviation Cannot rule out Anterior infarct , age undetermined Abnormal ECG When compared with ECG of 08-Jun-2018 16:46, PREVIOUS ECG IS PRESENT No significant change since last tracing Confirmed by Jacalyn Lefevre [phone]) on 10/07/2022 3:43:45 PM  Radiology DG Chest Port 1 View  Result Date: 10/07/2022 CLINICAL DATA:  Shortness of breath. Productive cough. Lightheadedness. Hypoxia. EXAM: PORTABLE CHEST 1 VIEW COMPARISON:  Two-view chest x-ray 03/25/2022 FINDINGS: The heart is enlarged, stable. Atherosclerotic changes are present at the aortic arch. Mild pulmonary vascular congestion is present without frank edema. No definite effusions are present. No focal airspace disease is present. Degenerative changes are again noted at the shoulders bilaterally. IMPRESSION: Cardiomegaly and mild pulmonary vascular congestion without frank edema. Electronically Signed   By: Marin Roberts M.D.   On: 10/07/2022 14:46    Procedures Procedures    Medications Ordered in ED Medications  ipratropium-albuterol (DUONEB) 0.5-2.5 (3) MG/3ML nebulizer solution 3 mL (has no administration in time range)  furosemide (LASIX) injection 40 mg (has no administration in time range)  potassium chloride SA (KLOR-CON M) CR tablet 40 mEq (has no administration in time range)  potassium chloride 10 mEq in 100 mL IVPB (has no administration in time range)  methylPREDNISolone sodium succinate (SOLU-MEDROL) 125 mg/2 mL injection 125 mg (125 mg Intravenous Given 10/07/22 1511)    ED Course/ Medical Decision Making/ A&P                             Medical Decision Making Amount and/or Complexity of Data Reviewed Labs: ordered. Radiology: ordered.  Risk Prescription drug management.   This patient presents to the ED for concern of sob, this involves an extensive number of treatment options, and is a complaint that carries with it a high risk of complications and morbidity.  The differential diagnosis includes  covid/flu/rsv, pna, chf   Co morbidities that complicate the patient evaluation  chronic pain, htn, hld, and arthritis   Additional history obtained:  Additional history obtained from epic chart review  Lab Tests:  I Ordered, and personally interpreted labs.  The pertinent results include:  cbc nl, bmp with k low at 2.7, covid/flu/rsv neg; bnp 31   Imaging Studies ordered:  I ordered imaging studies including cxr and ct chest  I independently visualized and interpreted imaging which showed  CXR: Cardiomegaly and mild pulmonary vascular congestion without frank  edema.   I agree with the radiologist interpretation   Cardiac Monitoring:  The patient was maintained on a cardiac monitor.  I personally viewed and interpreted the cardiac monitored which showed an underlying rhythm of: nsr   Medicines ordered and prescription drug management:  I ordered medication including solumedrol/neb and lasix  for sob  Reevaluation of the patient after these medicines showed that the patient improved I have reviewed the patients home medicines and have made adjustments as needed   Test Considered:  Ct chest   Critical Interventions:  nebs   Problem List / ED Course:  SOB:  CXR looks like CHF.  Pt said she is on a fluid pill, but there is not one listed.  I called her pharmacy and they don't have one.  BNP nl and she has no pedal edema.  So, I added on a ct chest to make sure there was not anything else.  This is pending at shift change. Oxygen low at work, but was ok here.  Pt will need to ambulate with pulse ox prior to d/c. Pt signed out to Dr. Charm Barges at d/c. Hypokalemia:  iv kcl and oral kdur ordered.  Mg level ordered.   Reevaluation:  After the interventions noted above, I reevaluated the patient and found that they have :improved   Social Determinants of Health:  Lives at home   Dispostion:  Pending CT chest.        Final Clinical Impression(s) / ED  Diagnoses Final diagnoses:  Hypokalemia  Dyspnea, unspecified type    Rx / DC Orders ED Discharge Orders     None         Jacalyn Lefevre, MD 10/07/22 1600

## 2022-10-08 ENCOUNTER — Observation Stay (HOSPITAL_BASED_OUTPATIENT_CLINIC_OR_DEPARTMENT_OTHER): Payer: Medicare HMO

## 2022-10-08 DIAGNOSIS — J9621 Acute and chronic respiratory failure with hypoxia: Secondary | ICD-10-CM | POA: Diagnosis not present

## 2022-10-08 DIAGNOSIS — I272 Pulmonary hypertension, unspecified: Secondary | ICD-10-CM

## 2022-10-08 DIAGNOSIS — R0603 Acute respiratory distress: Secondary | ICD-10-CM

## 2022-10-08 LAB — CBC
HCT: 38.2 % (ref 36.0–46.0)
Hemoglobin: 12.1 g/dL (ref 12.0–15.0)
MCH: 28.2 pg (ref 26.0–34.0)
MCHC: 31.7 g/dL (ref 30.0–36.0)
MCV: 89 fL (ref 80.0–100.0)
Platelets: 172 10*3/uL (ref 150–400)
RBC: 4.29 MIL/uL (ref 3.87–5.11)
RDW: 13.8 % (ref 11.5–15.5)
WBC: 3.5 10*3/uL — ABNORMAL LOW (ref 4.0–10.5)
nRBC: 0 % (ref 0.0–0.2)

## 2022-10-08 LAB — BASIC METABOLIC PANEL
Anion gap: 8 (ref 5–15)
BUN: 8 mg/dL (ref 8–23)
CO2: 30 mmol/L (ref 22–32)
Calcium: 8.1 mg/dL — ABNORMAL LOW (ref 8.9–10.3)
Chloride: 97 mmol/L — ABNORMAL LOW (ref 98–111)
Creatinine, Ser: 0.7 mg/dL (ref 0.44–1.00)
GFR, Estimated: >60 mL/min (ref >60)
Glucose, Bld: 134 mg/dL — ABNORMAL HIGH (ref 70–99)
Potassium: 4.4 mmol/L (ref 3.5–5.1)
Sodium: 135 mmol/L (ref 135–145)

## 2022-10-08 LAB — ECHOCARDIOGRAM COMPLETE
Area-P 1/2: 4.31 cm2
Height: 62 in
S' Lateral: 2.5 cm
Weight: 3809.55 oz

## 2022-10-08 LAB — GLUCOSE, CAPILLARY
Glucose-Capillary: 135 mg/dL — ABNORMAL HIGH (ref 70–99)
Glucose-Capillary: 207 mg/dL — ABNORMAL HIGH (ref 70–99)

## 2022-10-08 MED ORDER — IPRATROPIUM-ALBUTEROL 0.5-2.5 (3) MG/3ML IN SOLN: 3.0000 mL | Freq: Two times a day (BID) | RESPIRATORY_TRACT | Status: DC

## 2022-10-08 MED ORDER — IPRATROPIUM-ALBUTEROL 0.5-2.5 (3) MG/3ML IN SOLN
3.0000 mL | Freq: Two times a day (BID) | RESPIRATORY_TRACT | Status: DC
  Administered 2022-10-08 – 2022-10-09 (×2): 3 mL via RESPIRATORY_TRACT
  Filled 2022-10-08 (×2): qty 3

## 2022-10-08 MED ORDER — PREDNISONE 20 MG PO TABS
40.0000 mg | ORAL_TABLET | Freq: Every day | ORAL | Status: DC
  Administered 2022-10-09: 40 mg via ORAL
  Filled 2022-10-08: qty 2

## 2022-10-08 NOTE — Progress Notes (Signed)
[facility] PROGRESS NOTE  Patricia Fuentes (DOB: [birth year]) ZOX:[id number] PCP: Patricia Perches, MD  Brief Narrative: Patricia Fuentes is a 77 y.o. female with a history of chronic hypoxic respiratory failure, OHS/OSA, HTN, and morbid obesity who presented to the ED on 10/07/2022 with difficulty breathing and low oxygen saturation. She was confirmed to be newly hypoxemic in the ED. Work up revealed pulmonary vascular congestion on CXR, no PE on CTA, though it suggested pulmonary HTN. Lasix, steroids, breathing treatments were given on admission.   Subjective: Her breathing has improved some but with exertion she feels short winded much more than her baseline. No chest pain. +leg swelling at times.   Objective: BP 131/65   Pulse 86   Temp 99 F (37.2 C) (Oral)   Resp 17   Ht 5\' 2"  (1.575 m)   Wt 108 kg   SpO2 95%   BMI 43.55 kg/m   Gen: No distress Pulm: Distant without wheezes or crackles on supplemental oxygen.   CV: RRR, I/VI early systolic murmur at LSB, no JVD, trace pitting LE edema GI: Soft, NT, ND, +BS  Neuro: Alert and oriented. No new focal deficits. Ext: Warm, no deformities. Skin: No rashes, lesions or ulcers on visualized skin   Assessment & Plan: Acute on chronic hypoxic respiratory failure, acute on chronic HFpEF: Hypoxemia has improved with treatments thus far including lasix, though she remains with some respiratory distress on exertion.  - Deescalate steroids with resolution of wheezing.  - Continue breathing treatments scheduled and prn.  - Echocardiogram ordered. Last available echo Feb 2020 showed LVEF 60-65%, G1DD, enlarged RV with small PFO and L > R shunt, normal valves, dilated IVC, RA, LA. This may be contributing to symptoms.  - Tmax 100.71F though no infiltrate or other evidence of sepsis. Will monitor off abx for now.  - Incentive spirometry, encourage use of CPAP for OHS/OSA  Hypokalemia:  - Resolved with supplementation.  HTN:  -  Continue norvasc.    Thrombocytopenia: Resolved  Leukopenia: Not chronic.  - Check with diff in AM.  Patricia Nine, MD [facility] [email or web address] 10/08/2022, 10:28 AM

## 2022-10-08 NOTE — Care Management Obs Status (Signed)
MEDICARE OBSERVATION STATUS NOTIFICATION   Patient Details  Name: Patricia Fuentes MRN: [id number] Date of Birth: 1945/12/03   Medicare Observation Status Notification Given:  Yes    Elliot Gault, LCSW 10/08/2022, 2:18 PM

## 2022-10-08 NOTE — Plan of Care (Signed)

## 2022-10-08 NOTE — Progress Notes (Signed)
SATURATION QUALIFICATIONS: (This note is used to comply with regulatory documentation for home oxygen)  Patient Saturations on Room Air at Rest = 97%  Patient Saturations on Room Air while Ambulating = 96%  Patient Saturations on  Liters of oxygen while Ambulating = 96%  Please briefly explain why patient needs home oxygen: Patient does not require oxygen at this time she ambulated up and down hall and o2 sat remained at 96% R.A.

## 2022-10-08 NOTE — TOC Progression Note (Signed)
  Transition of Care [facility]) Screening Note   Patient Details  Name: Patricia Fuentes Date of Birth: 1946-02-06   Transition of Care [facility]) CM/SW Contact:    Elliot Gault, LCSW Phone Number: 10/08/2022, 2:16 PM    Transition of Care Department [facility]) has reviewed patient and no TOC needs have been identified at this time. We will continue to monitor patient advancement through interdisciplinary progression rounds. If new patient transition needs arise, please place a TOC consult.

## 2022-10-08 NOTE — Progress Notes (Signed)
  Echocardiogram 2D Echocardiogram has been performed.  Patricia Fuentes 10/08/2022, 12:25 PM

## 2022-10-09 DIAGNOSIS — J9601 Acute respiratory failure with hypoxia: Secondary | ICD-10-CM

## 2022-10-09 DIAGNOSIS — J9621 Acute and chronic respiratory failure with hypoxia: Secondary | ICD-10-CM | POA: Diagnosis not present

## 2022-10-09 DIAGNOSIS — E876 Hypokalemia: Secondary | ICD-10-CM | POA: Diagnosis not present

## 2022-10-09 DIAGNOSIS — E662 Morbid (severe) obesity with alveolar hypoventilation: Secondary | ICD-10-CM

## 2022-10-09 LAB — CBC WITH DIFFERENTIAL/PLATELET
Abs Immature Granulocytes: 0.03 10*3/uL (ref 0.00–0.07)
Basophils Absolute: 0 10*3/uL (ref 0.0–0.1)
Basophils Relative: 0 %
Eosinophils Absolute: 0 10*3/uL (ref 0.0–0.5)
Eosinophils Relative: 0 %
HCT: 36.6 % (ref 36.0–46.0)
Hemoglobin: 11.7 g/dL — ABNORMAL LOW (ref 12.0–15.0)
Immature Granulocytes: 0 %
Lymphocytes Relative: 11 %
Lymphs Abs: 0.8 10*3/uL (ref 0.7–4.0)
MCH: 28.6 pg (ref 26.0–34.0)
MCHC: 32 g/dL (ref 30.0–36.0)
MCV: 89.5 fL (ref 80.0–100.0)
Monocytes Absolute: 0.8 10*3/uL (ref 0.1–1.0)
Monocytes Relative: 11 %
Neutro Abs: 5.8 10*3/uL (ref 1.7–7.7)
Neutrophils Relative %: 78 %
Platelets: 174 10*3/uL (ref 150–400)
RBC: 4.09 MIL/uL (ref 3.87–5.11)
RDW: 14.1 % (ref 11.5–15.5)
WBC: 7.5 10*3/uL (ref 4.0–10.5)
nRBC: 0 % (ref 0.0–0.2)

## 2022-10-09 LAB — GLUCOSE, CAPILLARY: Glucose-Capillary: 117 mg/dL — ABNORMAL HIGH (ref 70–99)

## 2022-10-09 MED ORDER — FUROSEMIDE 20 MG PO TABS: 20.0000 mg | ORAL_TABLET | Freq: Every day | ORAL | Status: DC

## 2022-10-09 MED ORDER — PREDNISONE 20 MG PO TABS: 40.0000 mg | ORAL_TABLET | Freq: Every day | ORAL | 0 refills | Status: DC

## 2022-10-09 MED ORDER — FUROSEMIDE 20 MG PO TABS: 20.0000 mg | ORAL_TABLET | Freq: Every day | ORAL | 1 refills | Status: DC

## 2022-10-09 NOTE — Progress Notes (Signed)
   10/09/22 0900  ReDS Vest / Clip  Station Marker B  Ruler Value 38  ReDS Value Range < 36  ReDS Actual Value 34

## 2022-10-09 NOTE — Discharge Summary (Signed)
Physician Discharge Summary   Patient: Patricia Fuentes MRN: [id number] DOB: 10-28-1945  Admit date:     10/07/2022  Discharge date: 10/09/22  Discharge Physician: Onalee Hua Koren S[place]ersheim   PCP: Kerri Perches, MD   Recommendations at discharge:   Please follow up with primary care provider within 1-2 weeks  Please repeat BMP and CBC in one week     Hospital Course:  77 y.o. female with a history of chronic hypoxic respiratory failure, OHS/OSA, HTN, and morbid obesity who presented to the ED on 10/07/2022 with difficulty breathing and low oxygen saturation. She was confirmed to be newly hypoxemic in the ED. Work up revealed pulmonary vascular congestion on CXR, no PE on CTA, though it suggested pulmonary HTN. Lasix, steroids, breathing treatments were given on admission. The patient improved clinically, and she was weaned off of supplemental oxygen back to her usual baseline demand of 2L at night.  ReDS vest was 34 on day of d/c.  She will d/c with lasix 20 mg po daily.  She will follow up with PCP in one week for repeat BMP  Assessment and Plan: Acute on chronic hypoxic respiratory failure,  -multifactorial including acute on chronic HFpEF:  -in the setting of restrictive lung disease with OSA/OHS, pulm HTN and reactive airway disease -Hypoxemia has improved with treatments thus far including lasix, though she remains with some dyspnea on exertion  -ambulated on day of d/c on RA without desaturation <91% - Deescalate steroids with resolution of wheezing.  -d/c hom with prednisone x 2 more days (5 days total) - Continue breathing treatments scheduled and prn.  - Last available echo Feb 2020 showed LVEF 60-65%, G1DD, enlarged RV with small PFO and L > R shunt, normal valves, dilated IVC, RA, LA. This may be contributing to symptoms.  -10/08/22 Echo EF 60-65%, no WMA, G1DD, normal RVF, Asc aorta dilated 36mm.  Dilated IVC - Tmax 100.81F though no infiltrate or other evidence of sepsis. Will monitor  off abx for now--remained afebrile and hemodynamically stable -ReDS vest = 34 on day of d/c - Incentive spirometry, encourage use of CPAP for OHS/OSA   Hypokalemia:  - Resolved with supplementation.   HTN:  - Continue norvasc.     Thrombocytopenia: Resolved   Leukopenia: Not chronic.  - resolved -COVID/Flu/RSV neg       Consultants: none Procedures performed: none  Disposition: Home Diet recommendation:  Cardiac diet DISCHARGE MEDICATION: Allergies as of 10/09/2022       Reactions   Other    Promethazine Hcl Nausea And Vomiting, Other (See Comments)   dizzy        Medication List     TAKE these medications    amLODipine 10 MG tablet Commonly known as: NORVASC TAKE 1 TABLET BY MOUTH ONCE A DAY.   diclofenac Sodium 1 % Gel Commonly known as: Voltaren Apply to neck 3 times weekly , as needed, for pain   furosemide 20 MG tablet Commonly known as: LASIX Take 1 tablet (20 mg total) by mouth daily.   guaifenesin 100 MG/5ML syrup Commonly known as: ROBITUSSIN Take 200 mg by mouth 3 (three) times daily as needed for cough.   naproxen sodium 220 MG tablet Commonly known as: ALEVE Take 220 mg by mouth daily as needed (pain).   OXYGEN Inhale 2 L into the lungs at bedtime as needed.   predniSONE 20 MG tablet Commonly known as: DELTASONE Take 2 tablets (40 mg total) by mouth daily with breakfast. Start taking  on: Oct 10, 2022   Vitamin D3 50 MCG (2000 UT) capsule Take 1 capsule (2,000 Units total) by mouth daily.        Discharge Exam: Filed Weights   10/07/22 1329  Weight: 108 kg   HEENT:  Beat[place]/AT, No thrush, no icterus CV:  RRR, no rub, no S3, no S4 Lung:  CTA, no wheeze, no rhonchi Abd:  soft/+BS, NT Ext:  trace LE edema, no lymphangitis, no synovitis, no rash   Condition at discharge: stable  The results of significant diagnostics from this hospitalization (including imaging, microbiology, ancillary and laboratory) are listed below for  reference.   Imaging Studies: ECHOCARDIOGRAM COMPLETE  Result Date: 10/08/2022    ECHOCARDIOGRAM REPORT   Patient Name:   Patricia Fuentes Date of Exam: 10/08/2022 Medical Rec #:  [id number]         Height:       62.0 in Accession #:    [id number]        Weight:       238.1 lb Date of Birth:  [birth year]         BSA:          2.059 m Patient Age:    77 years          BP:           130/61 mmHg Patient Gender: F                 HR:           77 bpm. [facility] Procedure: 2D Echo, Cardiac Doppler and Color Doppler Indications:    Pulmonary hypertension I27.2                 Acutre respiratory distress R06.03  History:        Patient has prior history of Echocardiogram examinations, most                 recent 07/09/2018. Risk Factors:Hypertension, Non-Smoker and                 Dyslipidemia.  Sonographer:    Aron Baba Referring Phys: [id number]  Sonographer Comments: Image acquisition challenging due to patient body habitus and Image acquisition challenging due to respiratory motion. IMPRESSIONS  1. Left ventricular ejection fraction, by estimation, is 60 to 65%. The left ventricle has normal function. The left ventricle has no regional wall motion abnormalities. There is mild left ventricular hypertrophy. Left ventricular diastolic parameters are consistent with Grade I diastolic dysfunction (impaired relaxation).  2. Right ventricular systolic function is normal. The right ventricular size is normal. Tricuspid regurgitation signal is inadequate for assessing PA pressure.  3. The mitral valve is normal in structure. No evidence of mitral valve regurgitation. No evidence of mitral stenosis.  4. The aortic valve is tricuspid. There is mild calcification of the aortic valve. Aortic valve regurgitation is not visualized. No aortic stenosis is present.  5. Aortic dilatation noted. There is borderline dilatation of the ascending aorta, measuring 36 mm.  6. The inferior vena cava is dilated in  size with <50% respiratory variability, suggesting right atrial pressure of 15 mmHg. Comparison(s): No significant change from prior study. FINDINGS  Left Ventricle: Left ventricular ejection fraction, by estimation, is 60 to 65%. The left ventricle has normal function. The left ventricle has no regional wall motion abnormalities. The left ventricular internal cavity size was normal in size. There is  mild left ventricular  hypertrophy. Left ventricular diastolic parameters are consistent with Grade I diastolic dysfunction (impaired relaxation). Right Ventricle: The right ventricular size is normal. No increase in right ventricular wall thickness. Right ventricular systolic function is normal. Tricuspid regurgitation signal is inadequate for assessing PA pressure. Left Atrium: Left atrial size was normal in size. Right Atrium: Right atrial size was normal in size. Pericardium: There is no evidence of pericardial effusion. Mitral Valve: The mitral valve is normal in structure. No evidence of mitral valve regurgitation. No evidence of mitral valve stenosis. Tricuspid Valve: The tricuspid valve is not well visualized. Tricuspid valve regurgitation is not demonstrated. No evidence of tricuspid stenosis. Aortic Valve: The aortic valve is tricuspid. There is mild calcification of the aortic valve. Aortic valve regurgitation is not visualized. No aortic stenosis is present. Pulmonic Valve: The pulmonic valve was not well visualized. Pulmonic valve regurgitation is trivial. No evidence of pulmonic stenosis. Aorta: The aortic root is normal in size and structure and aortic dilatation noted. There is borderline dilatation of the ascending aorta, measuring 36 mm. Venous: The inferior vena cava is dilated in size with less than 50% respiratory variability, suggesting right atrial pressure of 15 mmHg. IAS/Shunts: The interatrial septum was not well visualized.  LEFT VENTRICLE PLAX 2D LVIDd:         4.50 cm   Diastology LVIDs:          2.50 cm   LV e' medial:    7.01 cm/s LV PW:         1.10 cm   LV E/e' medial:  13.2 LV IVS:        0.90 cm   LV e' lateral:   9.46 cm/s LVOT diam:     1.60 cm   LV E/e' lateral: 9.8 LV SV:         49 LV SV Index:   24 LVOT Area:     2.01 cm  RIGHT VENTRICLE RV S prime:     9.95 cm/s TAPSE (M-mode): 2.1 cm LEFT ATRIUM             Index        RIGHT ATRIUM           Index LA diam:        4.60 cm 2.23 cm/m   RA Area:     11.80 cm LA Vol (A2C):   54.0 ml 26.23 ml/m  RA Volume:   22.60 ml  10.98 ml/m LA Vol (A4C):   41.2 ml 20.01 ml/m LA Biplane Vol: 48.3 ml 23.46 ml/m  AORTIC VALVE LVOT Vmax:   111.00 cm/s LVOT Vmean:  75.000 cm/s LVOT VTI:    0.244 m  AORTA Ao Root diam: 3.00 cm Ao Asc diam:  3.60 cm MITRAL VALVE MV Area (PHT): 4.31 cm     SHUNTS MV Decel Time: 176 msec     Systemic VTI:  0.24 m MV E velocity: 92.50 cm/s   Systemic Diam: 1.60 cm MV A velocity: 110.00 cm/s MV E/A ratio:  0.84 Vishnu Priya Mallipeddi Electronically signed by Winfield Rast Mallipeddi Signature Date/Time: 10/08/2022/12:42:41 PM    Final    CT Angio Chest PE W and/or Wo Contrast  Result Date: 10/07/2022 CLINICAL DATA:  Cough, lightheadedness and hypoxia. EXAM: CT ANGIOGRAPHY CHEST WITH CONTRAST TECHNIQUE: Multidetector CT imaging of the chest was performed using the standard protocol during bolus administration of intravenous contrast. Multiplanar CT image reconstructions and MIPs were obtained to evaluate the vascular anatomy. RADIATION DOSE REDUCTION:  This exam was performed according to the departmental dose-optimization program which includes automated exposure control, adjustment of the mA and/or kV according to patient size and/or use of iterative reconstruction technique. CONTRAST:  80mL OMNIPAQUE IOHEXOL 350 MG/ML SOLN COMPARISON:  Prior CTA of the chest on 07/09/2017 FINDINGS: Cardiovascular: The pulmonary arteries are adequately opacified. There is no evidence of pulmonary embolism. Central pulmonary arteries  demonstrates stable mild dilatation with the main pulmonary artery measuring approximately 3.3 cm. The thoracic aorta is normal in caliber. Stable mild atherosclerosis of the thoracic aorta. Stable top-normal heart size. No pericardial fluid identified. No visualized calcified coronary artery plaque. Mediastinum/Nodes: No enlarged mediastinal, hilar, or axillary lymph nodes. Thyroid gland, trachea, and esophagus demonstrate no significant findings. Lungs/Pleura: Scarring in the lingula. The previously noted lateral subpleural nodule of the right upper lobe is no longer present. Stable 4 mm fissural nodule in the right minor fissure likely represents an intrapulmonary lymph node and is stable since 2019. Upper Abdomen: No acute abnormality. Musculoskeletal: No chest wall abnormality. No acute or significant osseous findings. Review of the MIP images confirms the above findings. IMPRESSION: 1. No evidence of pulmonary embolism or other acute findings in the chest. 2. Stable mild dilatation of the central pulmonary arteries which may be consistent with some degree of underlying pulmonary hypertension. 3. Stable 4 mm fissural nodule in the right minor fissure likely represents an intrapulmonary lymph node and is stable since 2019. The previously noted lateral subpleural nodule of the right upper lobe is no longer present. 4. Stable mild atherosclerosis of the thoracic aorta. 5. Stable top-normal heart size. 6. Scarring in the lingula. Aortic Atherosclerosis (ICD10-I70.0). Electronically Signed   By: Irish Lack M.D.   On: 10/07/2022 16:59   DG Chest Port 1 View  Result Date: 10/07/2022 CLINICAL DATA:  Shortness of breath. Productive cough. Lightheadedness. Hypoxia. EXAM: PORTABLE CHEST 1 VIEW COMPARISON:  Two-view chest x-ray 03/25/2022 FINDINGS: The heart is enlarged, stable. Atherosclerotic changes are present at the aortic arch. Mild pulmonary vascular congestion is present without frank edema. No definite  effusions are present. No focal airspace disease is present. Degenerative changes are again noted at the shoulders bilaterally. IMPRESSION: Cardiomegaly and mild pulmonary vascular congestion without frank edema. Electronically Signed   By: Marin Roberts M.D.   On: 10/07/2022 14:46    Microbiology: Results for orders placed or performed during the hospital encounter of 10/07/22  Resp panel by RT-PCR (RSV, Flu A&B, Covid) Anterior Nasal Swab     Status: None   Collection Time: 10/07/22  2:00 PM   Specimen: Anterior Nasal Swab  Result Value Ref Range Status   SARS Coronavirus 2 by RT PCR NEGATIVE NEGATIVE Final    Comment: (NOTE) SARS-CoV-2 target nucleic acids are NOT DETECTED.  The SARS-CoV-2 RNA is generally detectable in upper respiratory specimens during the acute phase of infection. The lowest concentration of SARS-CoV-2 viral copies this assay can detect is 138 copies/mL. A negative result does not preclude SARS-Cov-2 infection and should not be used as the sole basis for treatment or other patient management decisions. A negative result may occur with  improper specimen collection/handling, submission of specimen other than nasopharyngeal swab, presence of viral mutation(s) within the areas targeted by this assay, and inadequate number of viral copies(<138 copies/mL). A negative result must be combined with clinical observations, patient history, and epidemiological information. The expected result is Negative.  Fact Sheet for Patients:  BloggerCourse.com  Fact Sheet for Healthcare Providers:  SeriousBroker.it  This test is no t yet approved or cleared by the [place] and  has been authorized for detection and/or diagnosis of SARS-CoV-2 by FDA under an Emergency Use Authorization (EUA). This EUA will remain  in effect (meaning this test can be used) for the duration of the COVID-19 declaration under Section  564(b)(1) of the Act, 21 U.S.C.section 360bbb-3(b)(1), unless the authorization is terminated  or revoked sooner.       Influenza A by PCR NEGATIVE NEGATIVE Final   Influenza B by PCR NEGATIVE NEGATIVE Final    Comment: (NOTE) The Xpert Xpress SARS-CoV-2/FLU/RSV plus assay is intended as an aid in the diagnosis of influenza from Nasopharyngeal swab specimens and should not be used as a sole basis for treatment. Nasal washings and aspirates are unacceptable for Xpert Xpress SARS-CoV-2/FLU/RSV testing.  Fact Sheet for Patients: BloggerCourse.com  Fact Sheet for Healthcare Providers: SeriousBroker.it  This test is not yet approved or cleared by the [place] FDA and has been authorized for detection and/or diagnosis of SARS-CoV-2 by FDA under an Emergency Use Authorization (EUA). This EUA will remain in effect (meaning this test can be used) for the duration of the COVID-19 declaration under Section 564(b)(1) of the Act, 21 U.S.C. section 360bbb-3(b)(1), unless the authorization is terminated or revoked.     Resp Syncytial Virus by PCR NEGATIVE NEGATIVE Final    Comment: (NOTE) Fact Sheet for Patients: BloggerCourse.com  Fact Sheet for Healthcare Providers: SeriousBroker.it  This test is not yet approved or cleared by the [place] FDA and has been authorized for detection and/or diagnosis of SARS-CoV-2 by FDA under an Emergency Use Authorization (EUA). This EUA will remain in effect (meaning this test can be used) for the duration of the COVID-19 declaration under Section 564(b)(1) of the Act, 21 U.S.C. section 360bbb-3(b)(1), unless the authorization is terminated or revoked.  Performed at [facility], [address], [place], [place] [address]     Labs: CBC: Recent Labs  Lab 10/07/22 1428 10/08/22 0424 10/09/22 0433  WBC 4.5 3.5* 7.5  NEUTROABS 1.7  --   5.8  HGB 11.6* 12.1 11.7*  HCT 36.5 38.2 36.6  MCV 89.7 89.0 89.5  PLT 148* 172 174   Basic Metabolic Panel: Recent Labs  Lab 10/07/22 1428 10/08/22 0424  NA 138 135  K 2.7* 4.4  CL 97* 97*  CO2 33* 30  GLUCOSE 81 134*  BUN 8 8  CREATININE 0.73 0.70  CALCIUM 8.1* 8.1*  MG 1.9  --    Liver Function Tests: No results for input(s): "AST", "ALT", "ALKPHOS", "BILITOT", "PROT", "ALBUMIN" in the last 168 hours. CBG: Recent Labs  Lab 10/07/22 1950 10/08/22 1127 10/09/22 0534  GLUCAP 135* 207* 117*    Discharge time spent: greater than 30 minutes.  Signed: Catarina Hartshorn, MD [facility] 10/09/2022

## 2022-10-09 NOTE — Plan of Care (Signed)

## 2022-10-16 ENCOUNTER — Ambulatory Visit (INDEPENDENT_AMBULATORY_CARE_PROVIDER_SITE_OTHER): Payer: Medicare HMO | Admitting: Internal Medicine

## 2022-10-16 ENCOUNTER — Encounter: Payer: Self-pay | Admitting: Internal Medicine

## 2022-10-16 VITALS — BP 109/67 | HR 83 | Resp 16 | Ht 62.0 in | Wt 228.1 lb

## 2022-10-16 DIAGNOSIS — G4733 Obstructive sleep apnea (adult) (pediatric): Secondary | ICD-10-CM

## 2022-10-16 DIAGNOSIS — J9621 Acute and chronic respiratory failure with hypoxia: Secondary | ICD-10-CM

## 2022-10-16 NOTE — Patient Instructions (Addendum)
Thank you, Patricia Fuentes for allowing [place] to provide your care today.   We are going to check into getting you a CPAP and mask.  It is going to  be improtant for you to use  the CPAP.   Regular physical activity is one of the most important things you can do for your health. Look for ways to reduce time sitting and increase time moving. For example, make it a tradition to walk before or after dinner. I recommend at least 150 minutes of moderate-intensity physical activity a week for adults. You might split that into 30 minutes, 5 days a week.  We are going to try and get you a portable oxygen machine. This will help you feel more confident in going on walks without becoming short of breath.    Follow up schedule with Dr.Simpson in one month     Thurmon Fair, M.D.

## 2022-10-16 NOTE — Progress Notes (Unsigned)
   HPI:Patricia Fuentes is a 77 y.o. female who presents for evaluation of Hospitalization Follow-up (D/c 5/15 Hypoxia resp failure. Doesn't have any energy and feels so weak and jittery inside and oxygen is staying in low 90s) . For the details of today's visit, please refer to the assessment and plan.  Physical Exam: Vitals:   10/16/22 1449  BP: 109/67  Pulse: 83  Resp: 16  SpO2: 91%  Weight: 228 lb 1.9 oz (103.5 kg)  Height: 5\' 2"  (1.575 m)     Physical Exam Constitutional:      Appearance: She is well-groomed. She is morbidly obese.  Cardiovascular:     Rate and Rhythm: Normal rate and regular rhythm.     Heart sounds: No murmur heard. Pulmonary:     Effort: Pulmonary effort is normal.     Breath sounds: No wheezing or rales.  Neurological:     Mental Status: She is alert and oriented to person, place, and time.      Assessment & Plan:   Patricia Fuentes was seen today for hospitalization follow-up.  OSA on CPAP Overview: rx since 05/2018 by Dr Dohmeier  Assessment & Plan: No currently following with sleep medicine and no using CPAP. Her last mask was not fitting her and she stopped using the machine. Pulmonary HTN suggested on CT PE study done on admission for shortness of breath. Addressed importance of CPAP and weight loss. Patient in agreement, but needs a new mask and CPAP . Orders placed today.    Acute on chronic hypoxic respiratory failure [facility]) Assessment & Plan: Patient here for hospital follow up of acute on chronic hypoxic respiratory failure. Patient has chronic respiratory failure secondary to obesity hypoventilation syndrome. She also has OSA. Acute respiratory failure was from bronchitis. She is back to baseline wearing 2 L supplemental oxygen at night. She has not been wearing CPAP. She is feeling jittery and fatigued. I suspect the jittery feeling to improve over the week if it is from recent steroids. Oxygen Sat 91% on room air and dropped to 88% will  ambulating. Patient placed on 2 L supplemental oxygen and sat improved to 97%. Nurse placing orders for oxygen concentrator.       Patricia Banister, MD

## 2022-10-17 NOTE — Assessment & Plan Note (Addendum)
No currently following with sleep medicine and no using CPAP. Her last mask was not fitting her and she stopped using the machine. Pulmonary HTN suggested on CT PE study done on admission for shortness of breath. Addressed importance of CPAP and weight loss. Patient in agreement, but needs a new mask and CPAP . Orders placed today.

## 2022-10-17 NOTE — Assessment & Plan Note (Signed)
Patient here for hospital follow up of acute on chronic hypoxic respiratory failure. Patient has chronic respiratory failure secondary to obesity hypoventilation syndrome. She also has OSA. Acute respiratory failure was from bronchitis. She is back to baseline wearing 2 L supplemental oxygen at night. She has not been wearing CPAP. She is feeling jittery and fatigued. I suspect the jittery feeling to improve over the week if it is from recent steroids. Oxygen Sat 91% on room air and dropped to 88% will ambulating. Patient placed on 2 L supplemental oxygen and sat improved to 97%. Nurse placing orders for oxygen concentrator.

## 2022-10-24 ENCOUNTER — Telehealth: Payer: Self-pay | Admitting: Family Medicine

## 2022-10-24 NOTE — Telephone Encounter (Signed)
Patient came by the office and needs a new prescription cpap machine evaluate, titrate and if qualifes dispense @ 1-6 pulse dose and portable oxygen also.  Fax to [facility] both phone # [phone] and [phone]

## 2022-10-29 ENCOUNTER — Ambulatory Visit (INDEPENDENT_AMBULATORY_CARE_PROVIDER_SITE_OTHER): Payer: Medicare HMO | Admitting: Internal Medicine

## 2022-10-29 ENCOUNTER — Encounter: Payer: Self-pay | Admitting: Internal Medicine

## 2022-10-29 VITALS — BP 110/68 | HR 77 | Temp 98.3°F | Resp 18 | Ht 62.0 in | Wt 231.0 lb

## 2022-10-29 DIAGNOSIS — J9611 Chronic respiratory failure with hypoxia: Secondary | ICD-10-CM

## 2022-10-29 DIAGNOSIS — G4733 Obstructive sleep apnea (adult) (pediatric): Secondary | ICD-10-CM

## 2022-10-29 DIAGNOSIS — I288 Other diseases of pulmonary vessels: Secondary | ICD-10-CM

## 2022-10-29 DIAGNOSIS — J9612 Chronic respiratory failure with hypercapnia: Secondary | ICD-10-CM

## 2022-10-29 DIAGNOSIS — R053 Chronic cough: Secondary | ICD-10-CM | POA: Diagnosis not present

## 2022-10-29 MED ORDER — PANTOPRAZOLE SODIUM 40 MG PO TBEC
40.0000 mg | DELAYED_RELEASE_TABLET | Freq: Every day | ORAL | 1 refills | Status: DC
Start: 2022-10-29 — End: 2023-02-12

## 2022-10-29 NOTE — Progress Notes (Unsigned)
   HPI:[name] is a 77 y.o. female who presents for evaluation of cough. Patient was seen on 5/22 after being hospitalized for acute on chronic respiratory failure. Acute respiratory failure was from bronchitis .Cough has been chronic. Treated for bronchitis multiple times over the years.  After she came home the coughing slowly got better and then over the weekend, around 6/1 the coughing started to get worse and she is producing thick clear phlegm. Doesn't report feeling SOB. No fever. No COPD. No malaise  Physical Exam: Vitals:   10/29/22 1521  BP: 110/68  Pulse: 77  Resp: 18  Temp: 98.3 F (36.8 C)  TempSrc: Oral  SpO2: 97%  Weight: 231 lb (104.8 kg)  Height: 5\' 2"  (1.575 m)     Physical Exam Constitutional:      General: She is not in acute distress.    Appearance: She is morbidly obese. She is not ill-appearing.  Cardiovascular:     Rate and Rhythm: Normal rate and regular rhythm.     Heart sounds: No murmur heard. Pulmonary:     Effort: Pulmonary effort is normal.     Breath sounds: No wheezing or rales.  Musculoskeletal:     Right lower leg: No edema.     Left lower leg: No edema.      Assessment & Plan:   [name] was seen today for cough.  Chronic cough Assessment & Plan: Chronic cough, worsening recently. Hx of acid reflux and not on treatment. Will start Protonix.  Recommend Mucinex for thick congestion   Orders: -     Pantoprazole Sodium; Take 1 tablet (40 mg total) by mouth daily.  Dispense: 30 tablet; Refill: 1  Chronic respiratory failure with hypoxia and hypercapnia (HCC) Assessment & Plan: Patient had enlarged pulmonary artery on CT Angio PE. Reviewed Echo from 10/08/22 and inadequate TR jet to evaluate. The RV was normal. Patient has chronic respiratory failure from OHS and also OSA. She is a year out from qualifying for a new CPAP. Order cancelled for oxygen concentrator and patient received small oxygen tank. My nurse is checking on  this order and why it was cancelled. Referral place for pulmonology.  Orders: -     Ambulatory referral to Pulmonology  OSA on CPAP Overview: rx since 05/2018 by Dr Dohmeier  Assessment & Plan: Patient did not qualify for new CPAP. She is one year out from being able to obtain new machine.        Milus Banister, MD

## 2022-10-29 NOTE — Patient Instructions (Signed)
Thank you, Ms.[name] for allowing [place] to provide your care today.   Start taking Protonix daily. I have referred you to pulmonology for evaluation of possible pulmonary HTN. Follow up on 6/18 already scheduled with Dr.Simpson. Follow up if symptoms worsen or fail to improve    Referrals ordered today:    Referral Orders         Ambulatory referral to Pulmonology           Thurmon Fair, M.D.

## 2022-10-30 DIAGNOSIS — I288 Other diseases of pulmonary vessels: Secondary | ICD-10-CM | POA: Insufficient documentation

## 2022-10-30 NOTE — Assessment & Plan Note (Signed)
Patient had enlarged pulmonary artery on CT Angio PE. Reviewed Echo from 10/08/22 and inadequate TR jet to evaluate. The RV was normal. Patient has chronic respiratory failure from OHS and also OSA. She is a year out from qualifying for a new CPAP. Order cancelled for oxygen concentrator and patient received small oxygen tank. My nurse is checking on this order and why it was cancelled. Referral place for pulmonology.

## 2022-10-30 NOTE — Telephone Encounter (Signed)
[employer] already notified pt that she is not eligible for another cpap until 2025 or she would have to pay out of pocket. We are working on the oxygen concentraor order. They cancelled our order because she was already renting oxygen equipment from [employer]

## 2022-10-30 NOTE — Assessment & Plan Note (Signed)
Patient had enlarged pulmonary artery on CT Angio PE. Reviewed Echo and inadequate TR jet to evaluate. The RV was normal. Patient has chronic respiratory failure from OHS and OSA. Not on CPAP .

## 2022-10-31 DIAGNOSIS — R053 Chronic cough: Secondary | ICD-10-CM | POA: Insufficient documentation

## 2022-10-31 NOTE — Assessment & Plan Note (Addendum)
Chronic cough, worsening recently. Not on Ace inhibitor.  Hx of acid reflux and some reflux symptoms lately, not on treatment. Will start Protonix.  No sinus congestion on exam to suggest post nasal drip  Recommend Mucinex for thick phlegm  If not improving recommend follow up for chest xray

## 2022-10-31 NOTE — Assessment & Plan Note (Signed)
Patient did not qualify for new CPAP. She is one year out from being able to obtain new machine.

## 2022-11-12 ENCOUNTER — Encounter: Payer: Self-pay | Admitting: Family Medicine

## 2022-11-12 ENCOUNTER — Ambulatory Visit (INDEPENDENT_AMBULATORY_CARE_PROVIDER_SITE_OTHER): Payer: Medicare HMO | Admitting: Family Medicine

## 2022-11-12 VITALS — BP 106/61 | HR 82 | Ht 62.0 in | Wt 236.0 lb

## 2022-11-12 DIAGNOSIS — E785 Hyperlipidemia, unspecified: Secondary | ICD-10-CM

## 2022-11-12 DIAGNOSIS — I10 Essential (primary) hypertension: Secondary | ICD-10-CM | POA: Diagnosis not present

## 2022-11-12 DIAGNOSIS — E559 Vitamin D deficiency, unspecified: Secondary | ICD-10-CM

## 2022-11-12 DIAGNOSIS — E662 Morbid (severe) obesity with alveolar hypoventilation: Secondary | ICD-10-CM

## 2022-11-12 DIAGNOSIS — R7303 Prediabetes: Secondary | ICD-10-CM | POA: Diagnosis not present

## 2022-11-12 DIAGNOSIS — G4733 Obstructive sleep apnea (adult) (pediatric): Secondary | ICD-10-CM

## 2022-11-12 NOTE — Patient Instructions (Signed)
F/U mid September, lu vaccine at visit, call if you need me sooner  Your oxygen is between 94 and 95 % even with walking which is normal;  You re referred to pulmonary re sleep apnea  Fasting lipid, cmp and EGFr, HBA1C, TSH and vit D 1 week  before September visit  Please get shingles and TdAp at your pharmacy, overdue, and you need them  Thanks for choosing  Primary Care, we consider it a privelige to serve you.

## 2022-11-18 ENCOUNTER — Encounter: Payer: Self-pay | Admitting: Family Medicine

## 2022-11-18 NOTE — Progress Notes (Signed)
Patricia Fuentes     MRN: [id number]      DOB: 1946-03-13  Chief Complaint  Patient presents with   Follow-up    Follow up discuss recent ER visit and portable oxygen    HPI Patricia Fuentes is here for follow up and re-evaluation of chronic medical conditions, medication management and review of any available recent lab and radiology data.  Preventive health is updated, specifically  Cancer screening and Immunization.   Questions or concerns regarding consultations or procedures which the PT has had in the interim are  addressed. The PT denies any adverse reactions to current medications since the last visit.  There are no new concerns.  There are no specific complaints   ROS Denies recent fever or chills. Denies sinus pressure, nasal congestion, ear pain or sore throat. Denies chest congestion, productive cough or wheezing. Denies chest pains, palpitations and leg swelling Denies abdominal pain, nausea, vomiting,diarrhea or constipation.   Denies dysuria, frequency, hesitancy or incontinence. Denies joint pain, swelling and limitation in mobility. Denies headaches, seizures, numbness, or tingling. Denies depression, anxiety or insomnia. Denies skin break down or rash.   PE  BP 106/61 (BP Location: Right Arm, Patient Position: Sitting, Cuff Size: Large)   Pulse 82   Ht 5\' 2"  (1.575 m)   Wt 236 lb 0.6 oz (107.1 kg)   SpO2 94%   BMI 43.17 kg/m   Patient alert and oriented and in no cardiopulmonary distress.  HEENT: No facial asymmetry, EOMI,     Neck supple .  Chest: Clear to auscultation bilaterally.  CVS: S1, S2 no murmurs, no S3.Regular rate.  ABD: Soft non tender.   Ext: No edema  MS: Adequate ROM spine, shoulders, hips and knees.  Skin: Intact, no ulcerations or rash noted.  Psych: Good eye contact, normal affect. Memory intact not anxious or depressed appearing.  CNS: CN 2-12 intact, power,  normal throughout.no focal deficits noted.   Assessment &  Plan  OSA on CPAP Needs re evaluation and to resume treatment , refer Pulmonary  Hyperlipidemia LDL goal <100 Hyperlipidemia:Low fat diet discussed and encouraged.   Lipid Panel  Lab Results  Component Value Date   CHOL 188 07/05/2021   HDL 59 07/05/2021   LDLCALC 116 (H) 07/05/2021   TRIG 72 07/05/2021   CHOLHDL 3.2 07/05/2021     Updated lab needed at/ before next visit. Needs to lower fat intake   Essential hypertension DASH diet and commitment to daily physical activity for a minimum of 30 minutes discussed and encouraged, as a part of hypertension management. The importance of attaining a healthy weight is also discussed.     11/12/2022    4:19 PM 10/29/2022    3:21 PM 10/16/2022    2:49 PM 10/09/2022    5:38 AM 10/08/2022    8:36 PM 10/08/2022    4:38 PM 10/08/2022   11:27 AM  BP/Weight  Systolic BP 106 110 109 146 160 119 130  Diastolic BP 61 68 67 76 72 60 61  Wt. (Lbs) 236.04 231 228.12      BMI 43.17 kg/m2 42.25 kg/m2 41.72 kg/m2         Controlled, no change in medication   Obesity with alveolar hypoventilation and body mass index (BMI) of 40 or greater (HCC)  Patient re-educated about  the importance of commitment to a  minimum of 150 minutes of exercise per week as able.  The importance of healthy food choices with portion control  discussed, as well as eating regularly and within a 12 hour window most days. The need to choose "clean , green" food 50 to 75% of the time is discussed, as well as to make water the primary drink and set a goal of 64 ounces water daily.       11/12/2022    4:19 PM 10/29/2022    3:21 PM 10/16/2022    2:49 PM  Weight /BMI  Weight 236 lb 0.6 oz 231 lb 228 lb 1.9 oz  Height 5\' 2"  (1.575 m) 5\' 2"  (1.575 m) 5\' 2"  (1.575 m)  BMI 43.17 kg/m2 42.25 kg/m2 41.72 kg/m2      Prediabetes Patient educated about the importance of limiting  Carbohydrate intake , the need to commit to daily physical activity for a minimum of 30 minutes ,  and to commit weight loss. The fact that changes in all these areas will reduce or eliminate all together the development of diabetes is stressed.      Latest Ref Rng & Units 10/08/2022    4:24 AM 10/07/2022    2:28 PM 12/11/2021    3:25 PM 07/05/2021   11:25 AM 07/07/2020   11:34 AM  Diabetic Labs  HbA1c 4.8 - 5.6 %   5.7  6.0    Chol 100 - 199 mg/dL    161    HDL >09 mg/dL    59    Calc LDL 0 - 99 mg/dL    604    Triglycerides 0 - 149 mg/dL    72    Creatinine 5.40 - 1.00 mg/dL 9.81  1.91  4.78  2.95  0.71       11/12/2022    4:19 PM 10/29/2022    3:21 PM 10/16/2022    2:49 PM 10/09/2022    5:38 AM 10/08/2022    8:36 PM 10/08/2022    4:38 PM 10/08/2022   11:27 AM  BP/Weight  Systolic BP 106 110 109 146 160 119 130  Diastolic BP 61 68 67 76 72 60 61  Wt. (Lbs) 236.04 231 228.12      BMI 43.17 kg/m2 42.25 kg/m2 41.72 kg/m2           No data to display          Updated lab needed at/ before next visit.   Vitamin D deficiency Updated lab needed at/ before next visit.

## 2022-11-18 NOTE — Assessment & Plan Note (Signed)
Hyperlipidemia:Low fat diet discussed and encouraged.   Lipid Panel  Lab Results  Component Value Date   CHOL 188 07/05/2021   HDL 59 07/05/2021   LDLCALC 116 (H) 07/05/2021   TRIG 72 07/05/2021   CHOLHDL 3.2 07/05/2021     Updated lab needed at/ before next visit. Needs to lower fat intake

## 2022-11-18 NOTE — Assessment & Plan Note (Signed)
  Patient re-educated about  the importance of commitment to a  minimum of 150 minutes of exercise per week as able.  The importance of healthy food choices with portion control discussed, as well as eating regularly and within a 12 hour window most days. The need to choose "clean , green" food 50 to 75% of the time is discussed, as well as to make water the primary drink and set a goal of 64 ounces water daily.       11/12/2022    4:19 PM 10/29/2022    3:21 PM 10/16/2022    2:49 PM  Weight /BMI  Weight 236 lb 0.6 oz 231 lb 228 lb 1.9 oz  Height 5\' 2"  (1.575 m) 5\' 2"  (1.575 m) 5\' 2"  (1.575 m)  BMI 43.17 kg/m2 42.25 kg/m2 41.72 kg/m2

## 2022-11-18 NOTE — Assessment & Plan Note (Signed)
Updated lab needed at/ before next visit.   

## 2022-11-18 NOTE — Assessment & Plan Note (Signed)
Needs re evaluation and to resume treatment , refer Pulmonary

## 2022-11-18 NOTE — Assessment & Plan Note (Signed)
DASH diet and commitment to daily physical activity for a minimum of 30 minutes discussed and encouraged, as a part of hypertension management. The importance of attaining a healthy weight is also discussed.     11/12/2022    4:19 PM 10/29/2022    3:21 PM 10/16/2022    2:49 PM 10/09/2022    5:38 AM 10/08/2022    8:36 PM 10/08/2022    4:38 PM 10/08/2022   11:27 AM  BP/Weight  Systolic BP 106 110 109 146 160 119 130  Diastolic BP 61 68 67 76 72 60 61  Wt. (Lbs) 236.04 231 228.12      BMI 43.17 kg/m2 42.25 kg/m2 41.72 kg/m2         Controlled, no change in medication

## 2022-11-18 NOTE — Assessment & Plan Note (Signed)
Patient educated about the importance of limiting  Carbohydrate intake , the need to commit to daily physical activity for a minimum of 30 minutes , and to commit weight loss. The fact that changes in all these areas will reduce or eliminate all together the development of diabetes is stressed.      Latest Ref Rng & Units 10/08/2022    4:24 AM 10/07/2022    2:28 PM 12/11/2021    3:25 PM 07/05/2021   11:25 AM 07/07/2020   11:34 AM  Diabetic Labs  HbA1c 4.8 - 5.6 %   5.7  6.0    Chol 100 - 199 mg/dL    829    HDL >56 mg/dL    59    Calc LDL 0 - 99 mg/dL    213    Triglycerides 0 - 149 mg/dL    72    Creatinine 0.86 - 1.00 mg/dL 5.78  4.69  6.29  5.28  0.71       11/12/2022    4:19 PM 10/29/2022    3:21 PM 10/16/2022    2:49 PM 10/09/2022    5:38 AM 10/08/2022    8:36 PM 10/08/2022    4:38 PM 10/08/2022   11:27 AM  BP/Weight  Systolic BP 106 110 109 146 160 119 130  Diastolic BP 61 68 67 76 72 60 61  Wt. (Lbs) 236.04 231 228.12      BMI 43.17 kg/m2 42.25 kg/m2 41.72 kg/m2           No data to display          Updated lab needed at/ before next visit.

## 2022-11-21 ENCOUNTER — Other Ambulatory Visit: Payer: Self-pay | Admitting: Family Medicine

## 2022-11-25 NOTE — Progress Notes (Signed)
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  Patient discharged home with instruction to follow-up with PCP in 10 days.  Arrangement has been made to provide oxygen supplementation at discharge.    History of present illness:  As per H&P written by Patricia. Allena Fuentes on 06/08/2018 77 y.o. female with medical history significant for HTN, HLD, and OA presents the ED with about 1 week of cough productive of white sputum.  She has had intermittent episodes of feeling "woozy" but denies any other associated symptoms including fevers, diaphoresis, dyspnea, chest pain, palpitations, abdominal pain, or dysuria.  She has noted some swelling in her right leg.  She does report snoring at night says that her husband has noticed that she has occasional episodes where she stops breathing while sleeping.  She also reports daytime fatigue requiring naps.  She was referred for a sleep study however rescheduled it.  She denies any personal history of lung disease including asthma or COPD.  She is a never smoker but says she has occasional exposure to secondhand smoke.   Hospital Course:  1-hypoxia: In the setting of obesity hypoventilation syndrome, obstructive sleep apnea and acute reactive airway from bronchitis. -Patient with good response to the use of oxygen supplementation (2 L nasal cannula supplementation), initiation of antibiotics and the use of albuterol nebulizer. -Patient advised on the importance for weight loss, outpatient sleep study evaluation with initiation on CPAP if required and also  depending clinical response evaluation with pulmonary function test. -Discharged on Zithromax to complete a total of 5 days, Flonase and also as needed albuterol inhaler.     2-morbid obesity: With obstructive sleep apnea -Outpatient evaluation as mentioned above for sleep study and CPAP initiation. -Low calorie diet, portion control and increase physical activity to assist with weight loss. -Patient will benefit of referral to bariatric clinic to further assist with weight loss plans. -Body mass index is 45.73 kg/m.         History of Present Illness  11/11/2019  Pulmonary/ 1st office eval/Patricia Fuentes    Chief Complaint  Patient presents with   Pulmonary Consult    Referred by Patricia Patricia Fuentes. Former patient of Patricia Patricia Fuentes. She is using CPAP with 2lpm o2 with sleep. No o2 during the day. Breathing is overall doing well. She rarely uses albuterol.   Dyspnea:  MMRC1 = can walk nl pace, flat grade, can't hurry or go uphills or steps s legs give out first  Cough: none Sleep: cpap bed flat and 2 pillows / feels like rested most days  SABA use: maybe once a week at most when overdoes it New R > L leg swelling x weeks or a month or so, admits to eating lots of potato chips, no calf pain  Rec All calls related to sleep and sleep equipment should go to Patricia Patricia Fuentes  Only use your albuterol as a rescue medication  Try albuterol 15 min before an activity that you know would make you short of breath and see if it makes any  difference and if makes none then don't take it after activity unless you can't catch your breath. Avoid all salt and if the swelling doesn't improve see Patricia Patricia Fuentes (? Due to amlopidine)  To get the most out of exercise, you need to be continuously aware that you are short of breath, but never out of breath, for 30 minutes daily.  Return if breathing worse or need more albuterol.   11/27/2022  Re-establish ov/[place] office/Damyra Fuentes re: enlarged PA  maint on no resp rx/ just 2lpm at  hs  Chief Complaint  Patient presents with   Consult  Not using cpap x 6 months - mask issues/ has not contacted prescriber or DME (Patricia Fuentes) Dyspnea:  walks at [employer] slower than others/ variable 02 sats, fatigue is worse than doe  Cough: comes and goes x months no pattern  / variabilty min productive  Sleeping: on back bed is flat 2 big pillows wakes up with HA and tired in am/ mild hypersomnolence daytie SABA use: none  02: 02 2lpm hs only      No obvious day to day or daytime variability or assoc excess/ purulent sputum or mucus plugs or hemoptysis or cp or chest tightness, subjective wheeze or overt sinus or hb symptoms.   s without nocturnal  or early am exacerbation  of respiratory   . Also denies any obvious fluctuation of symptoms with weather or environmental changes or other aggravating or alleviating factors except as outlined above   No unusual exposure hx or h/o childhood pna/ asthma or knowledge of premature birth.  Current Allergies, Complete Past Medical History, Past Surgical History, Family History, and Social History were reviewed in [employer] record.  ROS  The following are not active complaints unless bolded Hoarseness, sore throat, dysphagia, dental problems, itching, sneezing,  nasal congestion or discharge of excess mucus or purulent secretions, ear ache,   fever, chills, sweats, unintended wt loss or wt gain, classically pleuritic or exertional cp,  orthopnea pnd or arm/hand swelling  or leg swelling, presyncope, palpitations, abdominal pain, anorexia, nausea, vomiting, diarrhea  or change in bowel habits or change in bladder habits, change in stools or change in urine, dysuria, hematuria,  rash, arthralgias, visual complaints, headache, numbness, weakness or ataxia or problems with walking or coordination,  change in mood or  memory.        Current Meds  Medication Sig   amLODipine (NORVASC) 10 MG tablet TAKE 1 TABLET BY MOUTH  ONCE A DAY.   Cholecalciferol (VITAMIN D3) 50 MCG (2000 UT) capsule Take 1 capsule (2,000 Units total) by mouth daily.   diclofenac Sodium (VOLTAREN) 1 % GEL Apply to neck 3 times weekly , as needed, for pain   furosemide (LASIX) 20 MG tablet Take 1 tablet (20 mg total) by mouth daily.   guaifenesin (ROBITUSSIN) 100 MG/5ML syrup Take 200 mg by mouth 3 (three) times daily as needed for cough.   naproxen sodium (ALEVE) 220 MG tablet Take 220 mg by mouth daily as needed (pain).   OXYGEN Inhale 2 L into the lungs at bedtime as needed.   pantoprazole (PROTONIX) 40 MG tablet Take 1 tablet (40 mg total) by mouth daily.                   Past Medical History:  Diagnosis Date   Bronchitis    Chronic back pain    Essential hypertension    Hyperlipidemia    IGT (impaired glucose tolerance)  2015   Obesity, unspecified    Osteoarthritis    Seasonal allergies        Objective:    Wts  11/27/2022         234  11/11/19 245 lb (111.1 kg)  09/06/19 243 lb (110.2 kg)  03/10/19 243 lb (110.2 kg)     Vital signs reviewed  11/27/2022  - Note at rest 02 sats  90% on RA   General appearance:    Morbidly obese amb wf nad      HEENT : Oropharynx  clear/ M2 airway        NECK :  without  apparent JVD/ palpable Nodes/TM    LUNGS: no acc muscle use,  Nl contour chest which is clear to A and P bilaterally without cough on insp or exp maneuvers   CV:  RRR  no s3 or murmur or increase in P2, and 1+ pitting edema both LE's wearing elastic hose  ABD:  quit obese soft and nontender with nl inspiratory excursion in the supine position. No bruits or organomegaly appreciated   MS:  slow  gait/ ext warm without deformities Or obvious joint restrictions  calf tenderness, cyanosis or clubbing    SKIN: warm and dry without lesions    NEURO:  alert, approp, nl sensorium with  no motor or cerebellar deficits apparent.      I personally reviewed images and agree with radiology impression as follows:    Chest CTa   10/07/22    1. No evidence of pulmonary embolism or other acute findings in the chest. 2. Stable mild dilatation of the central pulmonary arteries  3. Stable 4 mm fissural nodule in the right minor fissure likely represents an intrapulmonary lymph node and is stable since 2019. 4. Stable mild atherosclerosis of the thoracic aorta. 5. Stable top-normal heart size. 6. Scarring in the lingula.  Echo 10/08/22  G1 diastolic dysfunction and nl RV with est RAP = 15 but RA nl size          Assessment

## 2022-11-27 ENCOUNTER — Encounter: Payer: Self-pay | Admitting: Internal Medicine

## 2022-11-27 ENCOUNTER — Ambulatory Visit: Payer: Medicare HMO | Admitting: Internal Medicine

## 2022-11-27 VITALS — BP 120/67 | HR 82 | Ht 62.0 in | Wt 234.0 lb

## 2022-11-27 DIAGNOSIS — R0609 Other forms of dyspnea: Secondary | ICD-10-CM

## 2022-11-27 DIAGNOSIS — G4733 Obstructive sleep apnea (adult) (pediatric): Secondary | ICD-10-CM

## 2022-11-27 DIAGNOSIS — E662 Morbid (severe) obesity with alveolar hypoventilation: Secondary | ICD-10-CM

## 2022-11-27 NOTE — Patient Instructions (Addendum)
Dr Vickey Huger is doctor who prescribed all your sleep equipment  [phone]  My office will be contacting you by phone for referral to Dr Dohmeier   - if you don't hear back from my office within one week please call us back or notify us thru MyChart and we'll address it right away.   In the meantime we will order you an overnight oxygen level while you are on oxygen.   Pulmonary follow up is as needed

## 2022-11-28 NOTE — Assessment & Plan Note (Signed)
rx since 05/2018 by Dr Vickey Huger  Advised to wear 02 2lpm at hs for now pending re-eval by DME/ Dohmeier over best settings/ mask and we'll obtain an overnight 02 sat on 2lpm to see if additional 02 might help.  Etiology and pathophysiology of osa including relationship to obesity reviewed in detail

## 2022-11-28 NOTE — Assessment & Plan Note (Signed)
HC03  10/07/21  =  30 (slt down from priors)   Body mass index is 42.8 kg/m.  -  trending down slightly , encouraged  Lab Results  Component Value Date   TSH 1.680 12/11/2021      Contributing to doe and risk of GERD/dvt/PE  >>>   reviewed the need and the process to achieve and maintain neg calorie balance > defer f/u primary care including intermittently monitoring thyroid status       Pulmonary f/u is prn   Each maintenance medication was reviewed in detail including emphasizing most importantly the difference between maintenance and prns and under what circumstances the prns are to be triggered using an action plan format where appropriate.  Total time for H and P, chart review, counseling,  directly observing portions of ambulatory 02 saturation study/ and generating customized AVS unique to this office visit / same day charting > 45 min with pt not seen in > 3y

## 2022-11-28 NOTE — Assessment & Plan Note (Addendum)
Onset with wt gain ? Year Echo 07/09/2018 :  diastolic dysfunction / mild RVE/   LA=RA= mild dilation / small PF0 L > R  - PFT's  11/11/2018  FEV1 1.26 (76 % ) ratio 0.77  p 0 % improvement from saba p ? prior to study with FV curve minimal concavity and ERV  7% at wt 235   Chest CTa   10/07/22    1. No evidence of pulmonary embolism or other acute findings in the chest. 2. Stable mild dilatation of the central pulmonary arteries   Echo 10/08/22  G1 diastolic dysfunction and nl RV with est RAP = 15 but RA nl size - 11/27/2022   Walked on RA  x  2  lap(s) =  approx 300  ft  @ mod pace, stopped due to "legs tired" with lowest 02 sats 96% and no sob    She has classic restrictive physiology with preserved 02 sats with exertion c/w mobid obesity with borderline OHS by her last HC03 level and at risk of cor pulmonale from noct hypoxemia   I advised here that her lungs are suprisingly good shape based on walking study and CTa and no pulmonary rx or f/u needed at this point.  Focus should be on rx of noct hypoxemia/OSA and continued wt loss.

## 2023-01-01 ENCOUNTER — Telehealth: Payer: Self-pay | Admitting: Family Medicine

## 2023-01-01 NOTE — Telephone Encounter (Signed)
Patient called said waiting on provider to send in refill on furosemide (LASIX) 20 MG tablet last received by hospitalist. Pharmacy: [facility]

## 2023-01-02 ENCOUNTER — Other Ambulatory Visit: Payer: Self-pay

## 2023-01-02 ENCOUNTER — Encounter (HOSPITAL_COMMUNITY): Payer: Self-pay

## 2023-01-02 ENCOUNTER — Emergency Department (HOSPITAL_COMMUNITY)
Admission: EM | Admit: 2023-01-02 | Discharge: 2023-01-02 | Disposition: A | Discharge status: Home / Self Care | Payer: Medicare HMO

## 2023-01-02 DIAGNOSIS — Z79899 Other long term (current) drug therapy: Secondary | ICD-10-CM | POA: Diagnosis not present

## 2023-01-02 DIAGNOSIS — R9431 Abnormal electrocardiogram [ECG] [EKG]: Secondary | ICD-10-CM | POA: Diagnosis not present

## 2023-01-02 DIAGNOSIS — I110 Hypertensive heart disease with heart failure: Secondary | ICD-10-CM | POA: Insufficient documentation

## 2023-01-02 DIAGNOSIS — I509 Heart failure, unspecified: Secondary | ICD-10-CM | POA: Insufficient documentation

## 2023-01-02 DIAGNOSIS — M542 Cervicalgia: Secondary | ICD-10-CM | POA: Diagnosis not present

## 2023-01-02 DIAGNOSIS — M25512 Pain in left shoulder: Secondary | ICD-10-CM | POA: Insufficient documentation

## 2023-01-02 MED ORDER — METHOCARBAMOL 500 MG PO TABS: 500.0000 mg | ORAL_TABLET | Freq: Three times a day (TID) | ORAL | 0 refills | Status: DC

## 2023-01-02 MED ORDER — ACETAMINOPHEN 325 MG PO TABS
650.0000 mg | ORAL_TABLET | Freq: Once | ORAL | Status: AC
  Administered 2023-01-02: 650 mg via ORAL
  Filled 2023-01-02: qty 2

## 2023-01-02 MED ORDER — METHOCARBAMOL 500 MG PO TABS
500.0000 mg | ORAL_TABLET | Freq: Once | ORAL | Status: AC
  Administered 2023-01-02: 500 mg via ORAL
  Filled 2023-01-02: qty 1

## 2023-01-02 MED ORDER — LIDOCAINE 5 % EX PTCH
1.0000 | MEDICATED_PATCH | CUTANEOUS | Status: DC
  Administered 2023-01-02: 1 via TRANSDERMAL
  Filled 2023-01-02: qty 1

## 2023-01-02 MED ORDER — LIDOCAINE 5 % EX PTCH: 1.0000 | MEDICATED_PATCH | CUTANEOUS | 0 refills | Status: DC

## 2023-01-02 MED ORDER — FUROSEMIDE 20 MG PO TABS: 20.0000 mg | ORAL_TABLET | Freq: Every day | ORAL | 1 refills | Status: DC

## 2023-01-02 NOTE — ED Provider Notes (Signed)
[place] EMERGENCY DEPARTMENT AT [facility] Provider Note   CSN: [id number] Arrival date & time: 01/02/23  4098     History  Chief Complaint  Patient presents with   Neck Pain   Shoulder Pain    left    Patricia Fuentes is a 77 y.o. female.  He has PMH of hypertension, sleep apnea, CHF.  The ER for left trapezius pain that started last night, worse this morning when she woke up.  Pain with rotation of head to the left and raising her left arm.  Denies numbness or weakness, no radiation of pain, no chest pain or shortness of breath, no dizziness sweating or nausea.   Neck Pain Shoulder Pain Associated symptoms: neck pain        Home Medications Prior to Admission medications   Medication Sig Start Date End Date Taking? Authorizing Provider  lidocaine (LIDODERM) 5 % Place 1 patch onto the skin daily. Remove & Discard patch within 12 hours or as directed by MD 01/02/23  Yes Cristi Loron, Almus Woodham A, PA-C  methocarbamol (ROBAXIN) 500 MG tablet Take 1 tablet (500 mg total) by mouth 3 (three) times daily. 01/02/23  Yes Amia Rynders A, PA-C  amLODipine (NORVASC) 10 MG tablet TAKE 1 TABLET BY MOUTH ONCE A DAY. 11/21/22   Kerri Perches, MD  Cholecalciferol (VITAMIN D3) 50 MCG (2000 UT) capsule Take 1 capsule (2,000 Units total) by mouth daily. 05/10/22   Kerri Perches, MD  diclofenac Sodium (VOLTAREN) 1 % GEL Apply to neck 3 times weekly , as needed, for pain 12/11/21   Kerri Perches, MD  furosemide (LASIX) 20 MG tablet Take 1 tablet (20 mg total) by mouth daily. 01/02/23   Kerri Perches, MD  guaifenesin (ROBITUSSIN) 100 MG/5ML syrup Take 200 mg by mouth 3 (three) times daily as needed for cough.    [provider]  naproxen sodium (ALEVE) 220 MG tablet Take 220 mg by mouth daily as needed (pain).    [provider]  OXYGEN Inhale 2 L into the lungs at bedtime as needed.    [provider]  pantoprazole (PROTONIX) 40 MG tablet Take 1  tablet (40 mg total) by mouth daily. 10/29/22   Gardenia Phlegm, MD      Allergies    Other and Promethazine hcl    Review of Systems   Review of Systems  Musculoskeletal:  Positive for neck pain.    Physical Exam Updated Vital Signs BP (!) 145/56 (BP Location: Right Arm)   Pulse 77   Temp 98.2 F (36.8 C) (Oral)   Resp 20   Ht 5\' 2"  (1.575 m)   Wt 108.4 kg   SpO2 96%   BMI 43.71 kg/m  Physical Exam Vitals and nursing note reviewed.  Constitutional:      General: She is not in acute distress.    Appearance: She is well-developed.  HENT:     Head: Normocephalic and atraumatic.     Mouth/Throat:     Mouth: Mucous membranes are moist.  Eyes:     Conjunctiva/sclera: Conjunctivae normal.  Cardiovascular:     Rate and Rhythm: Normal rate and regular rhythm.     Heart sounds: No murmur heard. Pulmonary:     Effort: Pulmonary effort is normal. No respiratory distress.     Breath sounds: Normal breath sounds.  Abdominal:     Palpations: Abdomen is soft.     Tenderness: There is no abdominal tenderness.  Musculoskeletal:        General: No swelling.     Cervical back: Neck supple.     Comments: Point tenderness to left trapezius with muscle spasm noted on exam  Skin:    General: Skin is warm and dry.     Capillary Refill: Capillary refill takes less than 2 seconds.  Neurological:     General: No focal deficit present.     Mental Status: She is alert and oriented to person, place, and time.  Psychiatric:        Mood and Affect: Mood normal.     ED Results / Procedures / Treatments   Labs (all labs ordered are listed, but only abnormal results are displayed) Labs Reviewed - No data to display  EKG None  Radiology No results found.  Procedures Procedures    Medications Ordered in ED Medications  methocarbamol (ROBAXIN) tablet 500 mg (500 mg Oral Given 01/02/23 1801)  acetaminophen (TYLENOL) tablet 650 mg (650 mg Oral Given 01/02/23 1801)    ED Course/  Medical Decision Making/ A&P                                 Medical Decision Making Risk OTC drugs. Prescription drug management.   Ddx: sprain, strain, contusion, trigger point, other  ED course: pt with point tenderness of left trapezius and worsened pain with rotation of her head to the left, no numbness, no weakness, no radiation of pain.   EKG done out of abundance of precauation, and is normal. Pain improved with lidoderm and methocarbamol, and tylenol.  Advised on PCP and FU and return precautions.           Final Clinical Impression(s) / ED Diagnoses Final diagnoses:  Neck pain    Rx / DC Orders ED Discharge Orders          Ordered    lidocaine (LIDODERM) 5 %  Every 24 hours        01/02/23 1836    methocarbamol (ROBAXIN) 500 MG tablet  3 times daily        01/02/23 1837              Ma Rings, PA-C 01/03/23 1610    Pricilla Loveless, MD 01/04/23 [phone]

## 2023-01-02 NOTE — Telephone Encounter (Signed)
REFILL'S SENT  

## 2023-01-02 NOTE — Discharge Instructions (Addendum)
Care of you today.  You are seen in the ER for shoulder pain.  I Feel you have like muscle spasm.  You are being given lidocaine patches and muscle relaxer to help with the pain, you can use over-the-counter lidocaine patches if the insurance does not cover the prescription.  You to also give over-the-counter Tylenol as needed for pain as directed on packaging.  Follow-up close with your primary care doctor.  Come back to ER for new or worsening symptoms.

## 2023-01-02 NOTE — ED Triage Notes (Signed)
Pt reports  Neck/Shoulder pain Started this morning Hurts when she turns

## 2023-01-08 ENCOUNTER — Encounter: Payer: Self-pay | Admitting: Orthopedic Surgery

## 2023-01-08 ENCOUNTER — Ambulatory Visit: Payer: Medicare HMO | Admitting: Orthopedic Surgery

## 2023-01-08 VITALS — BP 121/72 | HR 84

## 2023-01-08 DIAGNOSIS — S46812A Strain of other muscles, fascia and tendons at shoulder and upper arm level, left arm, initial encounter: Secondary | ICD-10-CM

## 2023-01-08 MED ORDER — PREDNISONE 10 MG (21) PO TBPK: ORAL_TABLET | ORAL | 0 refills | Status: DC

## 2023-01-08 NOTE — Progress Notes (Signed)
New Patient Visit  Assessment: Patricia Fuentes is a 77 y.o. female with the following: 1. Trapezius strain, left, initial encounter  Plan: Patricia Fuentes has pain and tenderness to palpation within the left trapezius muscle.  Some radiating pains distally.  Some pain in the left side of her neck.  I was able to review a recent CT scan, which demonstrates minimal degenerative changes in the left shoulder.  Given the location of the pain, I do not think an injection in the left shoulder would improve her symptoms.  Overall, she states things are getting better, but she still has issues.  As such, I recommended a prednisone Dosepak.  She should stop taking naproxen while she is taking the prednisone.  Otherwise, she can use topical treatments including patches, creams and lotions.  Continue with ice or heat.  If she continues to have issues, she can contact the clinic.  Follow-up: Return if symptoms worsen or fail to improve.  Subjective:  Chief Complaint  Patient presents with   Shoulder Pain    Left- pain started about two weeks ago hurts when I cough and turn my neck across the top of shoulder and up into neck, numbness in long, ring, and small finger    History of Present Illness: Patricia Fuentes is a 77 y.o. female who presents for evaluation of left shoulder pain.  For the past 2-3 weeks, she has been having pain in the superior aspect the left shoulder.  No specific injury.  Pain is within the muscle, and radiates proximally in the left side of her neck.  It gets worse when she turns her head.  She also notes pain radiating distally into her left hand.  This is associate with some numbness and tingling.  She has been taking naproxen, as well as a muscle relaxer.   Review of Systems: No fevers or chills + numbness and tingling No chest pain No shortness of breath No bowel or bladder dysfunction No GI distress No headaches   Medical History:  Past Medical History:   Diagnosis Date   Bronchitis    Chronic back pain    Essential hypertension    Hyperlipidemia    IGT (impaired glucose tolerance) 2015   Obesity, unspecified    Osteoarthritis    Seasonal allergies     Past Surgical History:  Procedure Laterality Date   CARDIAC CATHETERIZATION N/A 10/25/2014   Procedure: Left Heart Cath and Coronary Angiography;  Surgeon: Kathleene Hazel, MD;  Location: [facility] INVASIVE CV LAB;  Service: Cardiovascular;  Laterality: N/A;   COLONOSCOPY N/A 02/09/2016   Procedure: COLONOSCOPY;  Surgeon: West Bali, MD;  Location: [facility] ENDO SUITE;  Service: Endoscopy;  Laterality: N/A;  12:30 PM   JOINT REPLACEMENT N/A    [employer] 07/27/2020   KNEE ARTHROSCOPY Left    TOTAL ABDOMINAL HYSTERECTOMY  1995 approx   Fibroids   TOTAL KNEE ARTHROPLASTY Right 2011   TOTAL KNEE ARTHROPLASTY Left 04/29/2017   Procedure: TOTAL KNEE ARTHROPLASTY;  Surgeon: Vickki Hearing, MD;  Location: [facility] ORS;  Service: Orthopedics;  Laterality: Left;    Family History  Problem Relation Age of Onset   Hypertension Mother    Stroke Mother 43       Deceased due CVa at 83   Diabetes Father    Cancer Father 14       Unknown, somewhere in stomach   Hypertension Brother    Hypertension Brother    Hypertension Brother  Cancer Maternal Grandfather        ?type   Hypertension Son    Social History   Tobacco Use   Smoking status: Never   Smokeless tobacco: Never  Vaping Use   Vaping status: Never Used  Substance Use Topics   Alcohol use: No   Drug use: No    Allergies  Allergen Reactions   Other    Promethazine Hcl Nausea And Vomiting and Other (See Comments)    dizzy    Current Meds  Medication Sig   amLODipine (NORVASC) 10 MG tablet TAKE 1 TABLET BY MOUTH ONCE A DAY.   Cholecalciferol (VITAMIN D3) 50 MCG (2000 UT) capsule Take 1 capsule (2,000 Units total) by mouth daily.   diclofenac Sodium (VOLTAREN) 1 % GEL Apply to neck 3 times weekly , as needed, for pain    furosemide (LASIX) 20 MG tablet Take 1 tablet (20 mg total) by mouth daily.   guaifenesin (ROBITUSSIN) 100 MG/5ML syrup Take 200 mg by mouth 3 (three) times daily as needed for cough.   lidocaine (LIDODERM) 5 % Place 1 patch onto the skin daily. Remove & Discard patch within 12 hours or as directed by MD   methocarbamol (ROBAXIN) 500 MG tablet Take 1 tablet (500 mg total) by mouth 3 (three) times daily.   naproxen sodium (ALEVE) 220 MG tablet Take 220 mg by mouth daily as needed (pain).   OXYGEN Inhale 2 L into the lungs at bedtime as needed.   pantoprazole (PROTONIX) 40 MG tablet Take 1 tablet (40 mg total) by mouth daily.   predniSONE (STERAPRED UNI-PAK 21 TAB) 10 MG (21) TBPK tablet 10 mg DS 12 as directed    Objective: BP 121/72   Pulse 84   Physical Exam:  General: Alert and oriented. and No acute distress. Gait: Normal gait.  Evaluation left shoulder demonstrates no deformity.  No bruising.  She has tenderness to palpation within the trapezius, extending up into the left side of her neck.  Mild tenderness to palpation of the posterior aspect of the left shoulder.  She has forward flexion 170 degrees.  Active abduction to 160 degrees.  Internal rotation to lumbar spine, but she notes some discomfort with this action.  External rotation at her side is similar to contralateral side.  Slightly decreased sensation to the tip of the long finger and index finger.   IMAGING: I personally reviewed images previously obtained from the ED  CT scan of the chest, from May, 2024 did include most of the shoulder.  Minimal degenerative changes of the glenohumeral joint.    New Medications:  Meds ordered this encounter  Medications   predniSONE (STERAPRED UNI-PAK 21 TAB) 10 MG (21) TBPK tablet    Sig: 10 mg DS 12 as directed    Dispense:  48 tablet    Refill:  0      Oliver Barre, MD  01/08/2023 3:43 PM

## 2023-01-29 ENCOUNTER — Institutional Professional Consult (permissible substitution): Payer: Medicare HMO | Admitting: Pulmonary Disease

## 2023-02-12 ENCOUNTER — Encounter: Payer: Self-pay | Admitting: Family Medicine

## 2023-02-12 ENCOUNTER — Ambulatory Visit (INDEPENDENT_AMBULATORY_CARE_PROVIDER_SITE_OTHER): Payer: Medicare HMO | Admitting: Family Medicine

## 2023-02-12 VITALS — BP 129/58 | HR 75 | Ht 62.0 in | Wt 233.1 lb

## 2023-02-12 DIAGNOSIS — E662 Morbid (severe) obesity with alveolar hypoventilation: Secondary | ICD-10-CM

## 2023-02-12 DIAGNOSIS — M170 Bilateral primary osteoarthritis of knee: Secondary | ICD-10-CM | POA: Diagnosis not present

## 2023-02-12 DIAGNOSIS — Z1231 Encounter for screening mammogram for malignant neoplasm of breast: Secondary | ICD-10-CM

## 2023-02-12 DIAGNOSIS — I10 Essential (primary) hypertension: Secondary | ICD-10-CM | POA: Diagnosis not present

## 2023-02-12 DIAGNOSIS — R7303 Prediabetes: Secondary | ICD-10-CM

## 2023-02-12 DIAGNOSIS — G4734 Idiopathic sleep related nonobstructive alveolar hypoventilation: Secondary | ICD-10-CM

## 2023-02-12 DIAGNOSIS — Z23 Encounter for immunization: Secondary | ICD-10-CM

## 2023-02-12 NOTE — Progress Notes (Unsigned)
BRYAH LIFFICK     MRN: [id number]      DOB: Apr 26, 1946  Chief Complaint  Patient presents with   Follow-up    Follow up no concerns    HPI Ms. Mulvaney is here for follow up and re-evaluation of chronic medical conditions, medication management and review of any available recent lab and radiology data.  Preventive health is updated, specifically  Cancer screening and Immunization.   Questions or concerns regarding consultations or procedures which the PT has had in the interim are  addressed. The PT denies any adverse reactions to current medications since the last visit.  Stomach cramping in morning 2 to 3 times per week with freq u  ROS Denies recent fever or chills. Denies sinus pressure, nasal congestion, ear pain or sore throat. Denies chest congestion, productive cough or wheezing. Denies chest pains, palpitations and leg swelling Denies abdominal pain, nausea, vomiting,diarrhea or constipation.   Denies dysuria, frequency, hesitancy or incontinence. Denies joint pain, swelling and limitation in mobility. Denies headaches, seizures, numbness, or tingling. Denies depression, anxiety or insomnia. Denies skin break down or rash.   PE  BP (!) 129/58 (BP Location: Right Arm, Patient Position: Sitting, Cuff Size: Normal)   Pulse 75   Ht 5\' 2"  (1.575 m)   Wt 233 lb 1.9 oz (105.7 kg)   SpO2 91%   BMI 42.64 kg/m   Patient alert and oriented and in no cardiopulmonary distress.  HEENT: No facial asymmetry, EOMI,     Neck supple .  Chest: Clear to auscultation bilaterally.  CVS: S1, S2 no murmurs, no S3.Regular rate.  ABD: Soft non tender.   Ext: No edema  MS: Adequate ROM spine, shoulders, hips and knees.  Skin: Intact, no ulcerations or rash noted.  Psych: Good eye contact, normal affect. Memory intact not anxious or depressed appearing.  CNS: CN 2-12 intact, power,  normal throughout.no focal deficits noted.   Assessment & Plan  No problem-specific  Assessment & Plan notes found for this encounter.

## 2023-02-12 NOTE — Patient Instructions (Signed)
Annual exam with MD in December or early January  Flu vaccine today ( nurse pkls document)  Pls schedule mammogram at checkout , nurse pls order  Need fasting labs tomorrow,.already ordered  Let's stop milk shakes and ice cream, NOT GOOD!!!  It is important that you exercise regularly at least 30 minutes 5 times a week. If you develop chest pain, have severe difficulty breathing, or feel very tired, stop exercising immediately and seek medical attention   Think about what you will eat, plan ahead. Choose " clean, green, fresh or frozen" over canned, processed or packaged foods which are more sugary, salty and fatty. 70 to 75% of food eaten should be vegetables and fruit. Three meals at set times with snacks allowed between meals, but they must be fruit or vegetables. Aim to eat over a 12 hour period , example 7 am to 7 pm, and STOP after  your last meal of the day. Drink water,generally about 64 ounces per day, no other drink is as healthy. Fruit juice is best enjoyed in a healthy way, by EATING the fruit. Thanks for choosing [facility], we consider it a privelige to serve you.

## 2023-02-13 ENCOUNTER — Encounter: Payer: Self-pay | Admitting: Family Medicine

## 2023-02-13 DIAGNOSIS — G4734 Idiopathic sleep related nonobstructive alveolar hypoventilation: Secondary | ICD-10-CM | POA: Insufficient documentation

## 2023-02-13 DIAGNOSIS — R7303 Prediabetes: Secondary | ICD-10-CM | POA: Diagnosis not present

## 2023-02-13 DIAGNOSIS — E559 Vitamin D deficiency, unspecified: Secondary | ICD-10-CM | POA: Diagnosis not present

## 2023-02-13 DIAGNOSIS — I10 Essential (primary) hypertension: Secondary | ICD-10-CM | POA: Diagnosis not present

## 2023-02-13 DIAGNOSIS — E785 Hyperlipidemia, unspecified: Secondary | ICD-10-CM | POA: Diagnosis not present

## 2023-02-13 NOTE — Assessment & Plan Note (Addendum)
Patient educated about the importance of limiting  Carbohydrate intake , the need to commit to daily physical activity for a minimum of 30 minutes , and to commit weight loss. The fact that changes in all these areas will reduce or eliminate all together the development of diabetes is stressed. Updated lab needed at/ before next visit.      Latest Ref Rng & Units 10/08/2022    4:24 AM 10/07/2022    2:28 PM 12/11/2021    3:25 PM 07/05/2021   11:25 AM 07/07/2020   11:34 AM  Diabetic Labs  HbA1c 4.8 - 5.6 %   5.7  6.0    Chol 100 - 199 mg/dL    956    HDL >21 mg/dL    59    Calc LDL 0 - 99 mg/dL    308    Triglycerides 0 - 149 mg/dL    72    Creatinine 6.57 - 1.00 mg/dL 8.46  9.62  9.52  8.41  0.71       02/12/2023    4:32 PM 01/08/2023    3:04 PM 01/02/2023    4:35 PM 11/27/2022    3:36 PM 11/12/2022    4:19 PM 10/29/2022    3:21 PM 10/16/2022    2:49 PM  BP/Weight  Systolic BP 129 121 145 120 106 110 109  Diastolic BP 58 72 56 67 61 68 67  Wt. (Lbs) 233.12  239 234 236.04 231 228.12  BMI 42.64 kg/m2  43.71 kg/m2 42.8 kg/m2 43.17 kg/m2 42.25 kg/m2 41.72 kg/m2       No data to display

## 2023-02-13 NOTE — Assessment & Plan Note (Signed)
Continue topical pain patch and aleve as needed, wight loss encouraged

## 2023-02-13 NOTE — Assessment & Plan Note (Signed)
Controlled, no change in medication DASH diet and commitment to daily physical activity for a minimum of 30 minutes discussed and encouraged, as a part of hypertension management. The importance of attaining a healthy weight is also discussed.     02/12/2023    4:32 PM 01/08/2023    3:04 PM 01/02/2023    4:35 PM 11/27/2022    3:36 PM 11/12/2022    4:19 PM 10/29/2022    3:21 PM 10/16/2022    2:49 PM  BP/Weight  Systolic BP 129 121 145 120 106 110 109  Diastolic BP 58 72 56 67 61 68 67  Wt. (Lbs) 233.12  239 234 236.04 231 228.12  BMI 42.64 kg/m2  43.71 kg/m2 42.8 kg/m2 43.17 kg/m2 42.25 kg/m2 41.72 kg/m2

## 2023-02-13 NOTE — Assessment & Plan Note (Signed)
Continue bedtime supplemental oxygen

## 2023-02-13 NOTE — Assessment & Plan Note (Signed)
  Patient re-educated about  the importance of commitment to a  minimum of 150 minutes of exercise per week as able.  The importance of healthy food choices with portion control discussed, as well as eating regularly and within a 12 hour window most days. The need to choose "clean , green" food 50 to 75% of the time is discussed, as well as to make water the primary drink and set a goal of 64 ounces water daily.       02/12/2023    4:32 PM 01/02/2023    4:35 PM 11/27/2022    3:36 PM  Weight /BMI  Weight 233 lb 1.9 oz 239 lb 234 lb  Height 5\' 2"  (1.575 m) 5\' 2"  (1.575 m) 5\' 2"  (1.575 m)  BMI 42.64 kg/m2 43.71 kg/m2 42.8 kg/m2    Slight decrease in weight which is good

## 2023-02-14 LAB — CMP14+EGFR
ALT: 6 IU/L (ref 0–32)
AST: 11 IU/L (ref 0–40)
Albumin: 4 g/dL (ref 3.8–4.8)
Alkaline Phosphatase: 80 IU/L (ref 44–121)
BUN/Creatinine Ratio: 13 (ref 12–28)
BUN: 8 mg/dL (ref 8–27)
Bilirubin Total: 0.5 mg/dL (ref 0.0–1.2)
CO2: 29 mmol/L (ref 20–29)
Calcium: 9.1 mg/dL (ref 8.7–10.3)
Chloride: 98 mmol/L (ref 96–106)
Creatinine, Ser: 0.64 mg/dL (ref 0.57–1.00)
Globulin, Total: 3.6 g/dL (ref 1.5–4.5)
Glucose: 89 mg/dL (ref 70–99)
Potassium: 3.6 mmol/L (ref 3.5–5.2)
Sodium: 141 mmol/L (ref 134–144)
Total Protein: 7.6 g/dL (ref 6.0–8.5)
eGFR: 92 mL/min/{1.73_m2} (ref >59)

## 2023-02-14 LAB — HEMOGLOBIN A1C
Est. average glucose Bld gHb Est-mCnc: 126 mg/dL
Hgb A1c MFr Bld: 6 % — ABNORMAL HIGH (ref 4.8–5.6)

## 2023-02-14 LAB — LIPID PANEL
Chol/HDL Ratio: 2.9 ratio (ref 0.0–4.4)
Cholesterol, Total: 184 mg/dL (ref 100–199)
HDL: 63 mg/dL (ref >39)
LDL Chol Calc (NIH): 107 mg/dL — ABNORMAL HIGH (ref 0–99)
Triglycerides: 73 mg/dL (ref 0–149)
VLDL Cholesterol Cal: 14 mg/dL (ref 5–40)

## 2023-02-14 LAB — VITAMIN D 25 HYDROXY (VIT D DEFICIENCY, FRACTURES): Vit D, 25-Hydroxy: 7.3 ng/mL — ABNORMAL LOW (ref 30.0–100.0)

## 2023-02-14 LAB — TSH: TSH: 1.18 u[IU]/mL (ref 0.450–4.500)

## 2023-02-19 MED ORDER — VITAMIN D (ERGOCALCIFEROL) 1.25 MG (50000 UNIT) PO CAPS: 50000.0000 [IU] | ORAL_CAPSULE | ORAL | 8 refills | Status: DC

## 2023-02-19 NOTE — Addendum Note (Signed)
Addended by: Syliva Overman E on: 02/19/2023 12:04 AM   Modules accepted: Orders

## 2023-03-29 ENCOUNTER — Other Ambulatory Visit: Payer: Self-pay | Admitting: Family Medicine

## 2023-04-03 ENCOUNTER — Other Ambulatory Visit: Payer: Self-pay

## 2023-04-03 MED ORDER — AMLODIPINE BESYLATE 10 MG PO TABS: 10.0000 mg | ORAL_TABLET | Freq: Every day | ORAL | 0 refills | Status: DC

## 2023-04-07 ENCOUNTER — Telehealth: Payer: Self-pay | Admitting: Family Medicine

## 2023-04-07 NOTE — Telephone Encounter (Signed)
Patient came by office today asking was the letter to reinstate oxygen with [employer]. Call patient when ready [phone].

## 2023-04-08 NOTE — Telephone Encounter (Signed)
Letter in provider's box for signature.

## 2023-04-09 NOTE — Telephone Encounter (Signed)
Faxed form to [employer] [phone]  Called patient left voicemail form ready for pick up and was faxed.

## 2023-04-10 ENCOUNTER — Telehealth: Payer: Self-pay | Admitting: Family Medicine

## 2023-04-10 NOTE — Telephone Encounter (Signed)
Copied from CRM [id number]. Topic: General - Other >> Apr 10, 2023  3:37 PM Alphonzo Lemmings O wrote:   Reason for CRM: patient calling cause she received voicemail about papers being filled out for a order of [employer] and the papers were ready but the doctors have not received it yet . Patient speak with [employer] and they have not received anything . Needing papers to be faxed as soon as possible. Patient says she will be by to get her copy today at 4pm

## 2023-04-11 NOTE — Telephone Encounter (Signed)
Form fax  Fax # updated Copy help up front for back up

## 2023-04-17 ENCOUNTER — Telehealth: Payer: Self-pay | Admitting: Family Medicine

## 2023-04-17 NOTE — Telephone Encounter (Signed)
Orders received put in providers box for signature

## 2023-04-17 NOTE — Telephone Encounter (Signed)
Copied from CRM [id number]. Topic: General - Inquiry >> Apr 16, 2023  9:34 AM Theodis Sato wrote: Reason for CRM: Lollie Sails at [employer] is inquiring to see if [facility] has received a fax about PT's oxygen tank-[phone]

## 2023-05-16 ENCOUNTER — Encounter: Payer: Medicare HMO | Admitting: Family Medicine

## 2023-05-16 ENCOUNTER — Ambulatory Visit: Payer: Self-pay | Admitting: Family Medicine

## 2023-05-16 ENCOUNTER — Ambulatory Visit: Payer: Medicare HMO | Admitting: Family Medicine

## 2023-05-16 ENCOUNTER — Encounter: Payer: Self-pay | Admitting: Family Medicine

## 2023-05-16 ENCOUNTER — Ambulatory Visit (INDEPENDENT_AMBULATORY_CARE_PROVIDER_SITE_OTHER): Payer: Medicare HMO | Admitting: Family Medicine

## 2023-05-16 VITALS — BP 129/72 | HR 84 | Ht 62.0 in | Wt 233.1 lb

## 2023-05-16 DIAGNOSIS — R058 Other specified cough: Secondary | ICD-10-CM | POA: Diagnosis not present

## 2023-05-16 DIAGNOSIS — R051 Acute cough: Secondary | ICD-10-CM

## 2023-05-16 DIAGNOSIS — Z0001 Encounter for general adult medical examination with abnormal findings: Secondary | ICD-10-CM

## 2023-05-16 DIAGNOSIS — R059 Cough, unspecified: Secondary | ICD-10-CM | POA: Insufficient documentation

## 2023-05-16 DIAGNOSIS — R7303 Prediabetes: Secondary | ICD-10-CM | POA: Diagnosis not present

## 2023-05-16 MED ORDER — PREDNISONE 10 MG PO TABS: 10.0000 mg | ORAL_TABLET | Freq: Two times a day (BID) | ORAL | 0 refills | Status: DC

## 2023-05-16 MED ORDER — BENZONATATE 100 MG PO CAPS: 100.0000 mg | ORAL_CAPSULE | Freq: Two times a day (BID) | ORAL | 0 refills | Status: DC | PRN

## 2023-05-16 MED ORDER — MONTELUKAST SODIUM 10 MG PO TABS: ORAL_TABLET | ORAL | 2 refills | Status: DC

## 2023-05-16 MED ORDER — POTASSIUM CHLORIDE CRYS ER 20 MEQ PO TBCR
20.0000 meq | EXTENDED_RELEASE_TABLET | Freq: Every day | ORAL | 3 refills | Status: AC
Start: 2023-05-16 — End: ?

## 2023-05-16 MED ORDER — AMLODIPINE BESYLATE 10 MG PO TABS: 10.0000 mg | ORAL_TABLET | Freq: Every day | ORAL | 3 refills | Status: DC

## 2023-05-16 MED ORDER — FUROSEMIDE 20 MG PO TABS: 20.0000 mg | ORAL_TABLET | Freq: Every day | ORAL | 3 refills | Status: AC

## 2023-05-16 NOTE — Patient Instructions (Signed)
Please cancel her annual exam in January at checkout.    Please schedule for follow-up in 3 months.  Call if you need me sooner  Chem-7 today.  You are treated for allergic cough.   Please commit to daily  montelukast for allergy control .   5-day course of prednisone is prescribed also.   Tessalon Perles are prescribed as  decongestant if these  (  perles) are not cost effective you may substitute with  Robitussin DM.  Your vitamin D level is extremely low.  Please commit to taking over-the-counter vitamin D3 5000 units every day.  It is important that you take Lasix your fluid pill every day.  You  report having run out of it over 1 week ago and now you have leg swolling.   Every day that you take the fluid pill furosemide you do need to take the potassium which I have also prescribed.    Continue toi reduce fried and fatty foods also sweeta and white starchy foods  Fasting lipid Chem-7 and HbA1c 3 to 5 days before your March visit.  Best for 2025!  Thanks for choosing [facility], we consider it a privelige to serve you.

## 2023-05-16 NOTE — Assessment & Plan Note (Signed)
Uncontrolled needfs tocommit to daily singulair, short course prednisone prescribed, tessalon perles also prescribed

## 2023-05-16 NOTE — Telephone Encounter (Signed)
Copied from CRM [id number]. Topic: Clinical - Pink Word Triage >> May 16, 2023  1:20 PM Alvino Blood C wrote: Reason for Triage: PT id coughing and would like an anti-biotic  Chief Complaint: cold s/s pt states possible pneumonia Symptoms: cough, nasal drainage, some vomiting, throat irritation, fatigue, sputum yellowish-green Frequency: started on Sunday Pertinent Negatives: Patient denies SOB Disposition: [] ED /[] Urgent Care (no appt availability in office) / [x] Appointment(In office/virtual)/ []  [place] [facility]/ [] Home Care/ [] Refused Recommended Disposition /[] [facility]/ []  Follow-up with PCP Additional Notes: pt states has an history of getting pneumonia and states s/s are the same: is requesting ABX.  Nurse recommenced coming into office to be seen: appt set for today w/PCP: pt verbalized understanding.  Nurse also gave home care advice Reason for Disposition  [1] Nasal discharge AND [2] present > 10 days  Answer Assessment - Initial Assessment Questions 1. ONSET: "When did the nasal discharge start?"      Started around Sunday  2. AMOUNT: "How much discharge is there?"      Nasal drainage going down back of throat, throat irritation 3. COUGH: "Do you have a cough?" If Yes, ask: "Describe the color of your sputum" (clear, white, yellow, green)     Cough productive - white sputum 4. RESPIRATORY DISTRESS: "Describe your breathing."      No SOB 5. FEVER: "Do you have a fever?" If Yes, ask: "What is your temperature, how was it measured, and when did it start?"     No fever 6. SEVERITY: "Overall, how bad are you feeling right now?" (e.g., doesn't interfere with normal activities, staying home from school/work, staying in bed)      Feel tired 7. OTHER SYMPTOMS: "Do you have any other symptoms?" (e.g., sore throat, earache, wheezing, vomiting)     Sore throat, at times coughing leads to vomiting 8. PREGNANCY: "Is there any chance you are pregnant?" "When was your last  menstrual period?"     N/a  Protocols used: Common Cold-A-AH

## 2023-05-17 LAB — BMP8+EGFR
BUN/Creatinine Ratio: 11 — ABNORMAL LOW (ref 12–28)
BUN: 8 mg/dL (ref 8–27)
CO2: 32 mmol/L — ABNORMAL HIGH (ref 20–29)
Calcium: 8.6 mg/dL — ABNORMAL LOW (ref 8.7–10.3)
Chloride: 99 mmol/L (ref 96–106)
Creatinine, Ser: 0.7 mg/dL (ref 0.57–1.00)
Glucose: 107 mg/dL — ABNORMAL HIGH (ref 70–99)
Potassium: 3.9 mmol/L (ref 3.5–5.2)
Sodium: 142 mmol/L (ref 134–144)
eGFR: 90 mL/min/{1.73_m2} (ref >59)

## 2023-05-18 NOTE — Assessment & Plan Note (Signed)
Annual exam as documented. . Immunization and cancer screening needs are specifically addressed at this visit.  

## 2023-05-18 NOTE — Progress Notes (Signed)
     Patricia Fuentes     MRN: [id number]      DOB: 11-20-1945  Chief Complaint  Patient presents with   Annual Exam    C/o cough x 5 days    HPI: Patient is in for annual physical exam. 5 day h/o cough, no fever or choills or body aches, no known sick contact   PE: BP 129/72 (BP Location: Right Arm, Patient Position: Sitting, Cuff Size: Large)   Pulse 84   Ht 5\' 2"  (1.575 m)   Wt 233 lb 1.9 oz (105.7 kg)   SpO2 90%   BMI 42.64 kg/m   Pleasant  female, alert and oriented x 3, in no cardio-pulmonary distress. Afebrile. HEENT No facial trauma or asymetry. Sinuses non tender.  Extra occullar muscles intact.. External ears normal, . Neck: supple, no adenopathy,JVD or thyromegaly.No bruits.  Chest: Clear to ascultation bilaterally.No crackles or wheezes. Non tender to palpation    Cardiovascular system; Heart sounds normal,  S1 and  S2 ,no S3.  No murmur, or thrill. Apical beat not displaced Peripheral pulses normal.  Abdomen: Soft, non tender, no organomegaly or masses. No bruits. Bowel sounds normal. No guarding, tenderness or rebound.    Musculoskeletal exam: Decreased  ROM of spine, hips , shoulders and knees. Deformity ,swelling and  crepitus noted. No muscle wasting or atrophy.   Neurologic: Cranial nerves 2 to 12 intact. Power, tone ,sensation  reflexes normal throughout. disturbance in gait. No tremor.  Skin: Intact, no ulceration, erythema , scaling or rash noted. Pigmentation normal throughout  Psych; Normal mood and affect. Judgement and concentration normal   Assessment & Plan:  Allergic cough Uncontrolled needfs tocommit to daily singulair, short course prednisone prescribed, tessalon perles also prescribed  Encounter for Medicare annual examination with abnormal findings Annual exam as documented. Immunization and cancer screening needs are specifically addressed at this visit.

## 2023-05-19 ENCOUNTER — Ambulatory Visit (HOSPITAL_COMMUNITY): Payer: Medicare HMO

## 2023-06-02 ENCOUNTER — Ambulatory Visit (HOSPITAL_COMMUNITY): Payer: Medicare HMO

## 2023-06-05 ENCOUNTER — Encounter: Payer: Medicare HMO | Admitting: Family Medicine

## 2023-06-09 ENCOUNTER — Ambulatory Visit (HOSPITAL_COMMUNITY): Payer: Medicare HMO

## 2023-07-09 ENCOUNTER — Ambulatory Visit (HOSPITAL_COMMUNITY): Payer: Medicare HMO

## 2023-07-14 ENCOUNTER — Encounter (HOSPITAL_COMMUNITY): Payer: Self-pay

## 2023-07-14 ENCOUNTER — Ambulatory Visit (HOSPITAL_COMMUNITY)
Admission: RE | Admit: 2023-07-14 | Discharge: 2023-07-14 | Disposition: A | Discharge status: Home / Self Care | Payer: Medicare HMO | Source: Ambulatory Visit | Attending: Family Medicine | Admitting: Family Medicine

## 2023-07-14 DIAGNOSIS — Z1231 Encounter for screening mammogram for malignant neoplasm of breast: Secondary | ICD-10-CM | POA: Diagnosis not present

## 2023-07-28 ENCOUNTER — Telehealth: Payer: Self-pay | Admitting: Family Medicine

## 2023-07-28 NOTE — Telephone Encounter (Signed)
 Copied from CRM [id number]. Topic: Clinical - Prescription Issue >> Jul 28, 2023  4:16 PM Thliyah D wrote: Patient's CPAP machine doesn't work. I called the office they are closed due to a incident. Patient also wants a CPAP machine that doesn't require her to wear a mask and needs doctor to write a order.

## 2023-07-29 NOTE — Telephone Encounter (Signed)
 Lvm with pulmonary phone #

## 2023-07-29 NOTE — Telephone Encounter (Signed)
 She needs to contact the provider that originally ordered the cpap (pulmonary) to request this

## 2023-07-30 ENCOUNTER — Telehealth: Payer: Self-pay | Admitting: Internal Medicine

## 2023-07-30 NOTE — Telephone Encounter (Signed)
 Patient states needs order for CPAP machine. Patient uses [facility] for CPAP machine. Patient phone number is [phone].

## 2023-07-30 NOTE — Telephone Encounter (Signed)
 Yes but needs sleep medicine f/u either np or md

## 2023-07-30 NOTE — Telephone Encounter (Signed)
 Pt is requesting an order for c-pap supplies is this okay to order , please advise

## 2023-07-31 ENCOUNTER — Other Ambulatory Visit: Payer: Self-pay

## 2023-07-31 DIAGNOSIS — G4733 Obstructive sleep apnea (adult) (pediatric): Secondary | ICD-10-CM

## 2023-07-31 NOTE — Telephone Encounter (Signed)
 Called and lvm for patient to call us back regarding setting up an appt  with  one App at the [place]

## 2023-07-31 NOTE — Telephone Encounter (Signed)
 Sent order for C-pap supplies

## 2023-08-01 NOTE — Telephone Encounter (Signed)
 [facility] is the doctor who ordered it and we haven't seen her for OSA yet though she was referred back in June 2024 to our office.   If she qualifies for a new one we can have the dme company re-new with previous orders.  If not, would just make sure she continues 02 and get her back to sleep medicine AP asap

## 2023-08-01 NOTE — Telephone Encounter (Signed)
 Pt is requesting a order for a new Cpap machine, I don't see where she was given this though our office

## 2023-08-08 ENCOUNTER — Other Ambulatory Visit: Payer: Self-pay | Admitting: Family Medicine

## 2023-08-08 DIAGNOSIS — E785 Hyperlipidemia, unspecified: Secondary | ICD-10-CM

## 2023-08-08 DIAGNOSIS — I10 Essential (primary) hypertension: Secondary | ICD-10-CM

## 2023-08-08 DIAGNOSIS — R7303 Prediabetes: Secondary | ICD-10-CM

## 2023-08-09 LAB — BMP8+EGFR
BUN/Creatinine Ratio: 15 (ref 12–28)
BUN: 11 mg/dL (ref 8–27)
CO2: 31 mmol/L — ABNORMAL HIGH (ref 20–29)
Calcium: 8.7 mg/dL (ref 8.7–10.3)
Chloride: 99 mmol/L (ref 96–106)
Creatinine, Ser: 0.75 mg/dL (ref 0.57–1.00)
Glucose: 99 mg/dL (ref 70–99)
Potassium: 4 mmol/L (ref 3.5–5.2)
Sodium: 142 mmol/L (ref 134–144)
eGFR: 83 mL/min/{1.73_m2} (ref >59)

## 2023-08-09 LAB — HEMOGLOBIN A1C
Est. average glucose Bld gHb Est-mCnc: 131 mg/dL
Hgb A1c MFr Bld: 6.2 % — ABNORMAL HIGH (ref 4.8–5.6)

## 2023-08-09 LAB — LIPID PANEL
Chol/HDL Ratio: 2.9 ratio (ref 0.0–4.4)
Cholesterol, Total: 187 mg/dL (ref 100–199)
HDL: 65 mg/dL (ref >39)
LDL Chol Calc (NIH): 112 mg/dL — ABNORMAL HIGH (ref 0–99)
Triglycerides: 52 mg/dL (ref 0–149)
VLDL Cholesterol Cal: 10 mg/dL (ref 5–40)

## 2023-08-13 ENCOUNTER — Other Ambulatory Visit (HOSPITAL_COMMUNITY): Payer: Self-pay

## 2023-08-13 ENCOUNTER — Ambulatory Visit: Payer: Medicare HMO | Admitting: Family Medicine

## 2023-08-13 ENCOUNTER — Encounter: Payer: Self-pay | Admitting: Family Medicine

## 2023-08-13 ENCOUNTER — Telehealth: Payer: Self-pay | Admitting: Pharmacy Technician

## 2023-08-13 VITALS — BP 114/66 | HR 72 | Resp 16 | Ht 62.0 in | Wt 237.8 lb

## 2023-08-13 DIAGNOSIS — G4733 Obstructive sleep apnea (adult) (pediatric): Secondary | ICD-10-CM

## 2023-08-13 DIAGNOSIS — E662 Morbid (severe) obesity with alveolar hypoventilation: Secondary | ICD-10-CM

## 2023-08-13 DIAGNOSIS — E559 Vitamin D deficiency, unspecified: Secondary | ICD-10-CM

## 2023-08-13 DIAGNOSIS — I10 Essential (primary) hypertension: Secondary | ICD-10-CM | POA: Diagnosis not present

## 2023-08-13 DIAGNOSIS — R7303 Prediabetes: Secondary | ICD-10-CM

## 2023-08-13 MED ORDER — TIRZEPATIDE-WEIGHT MANAGEMENT 2.5 MG/0.5ML ~~LOC~~ SOAJ: 2.5000 mg | SUBCUTANEOUS | 0 refills | Status: DC

## 2023-08-13 MED ORDER — AMLODIPINE BESYLATE 10 MG PO TABS: 10.0000 mg | ORAL_TABLET | Freq: Every day | ORAL | 1 refills | Status: AC

## 2023-08-13 NOTE — Patient Instructions (Addendum)
 F/u in 2 months, call if you need me sooner  NEW is once weekly injection Zepbound for weight loss ,let us know if you have difficulty getting this   NEED to reduce / stop sugar except I fruit, your blood sugar has gone up Need to reduce fried foods  VEGES and fruit, water and beans are all healthy  White meats like chicken, [place], fish, LEAN pork are healthy  HAPPY BIRTHDAY!!   Nurse pls order cologuard for December 2025  It is important that you exercise regularly at least 30 minutes 5 times a week. If you develop chest pain, have severe difficulty breathing, or feel very tired, stop exercising immediately and seek medical attention    Thanks for choosing [facility], we consider it a privelige to serve you.

## 2023-08-13 NOTE — Telephone Encounter (Signed)
 Pharmacy Patient Advocate Encounter   Received notification from CoverMyMeds that prior authorization for ZEPBOUND 2.5MG /0.5ML AUTO-INJECTORS is required/requested.   Insurance verification completed.   The patient is insured through [employer] .   Per test claim: PA required; PA submitted to above mentioned insurance via CoverMyMeds Key/confirmation #/EOC [id number] Status is pending

## 2023-08-14 ENCOUNTER — Other Ambulatory Visit (HOSPITAL_COMMUNITY): Payer: Self-pay

## 2023-08-14 NOTE — Telephone Encounter (Signed)
 Pharmacy Patient Advocate Encounter  Received notification from [facility] that Prior Authorization for ZEPBOUND 2.5MG /0.5ML AUTO-INJECTORS has been APPROVED from 08/13/2023 to 212/31/2025. Unable to obtain price due to refill too soon rejection, last fill date 08/14/2023 next available fill date04/02/2024.   PA #/Case ID/Reference #: [id number]

## 2023-08-18 NOTE — Telephone Encounter (Signed)
 LVM for return call.  We have not seen patient for sleep/OSA. She should contact Neuro office for assistance with new CPAP machine. She may schedule NP visit for OSA with sleep APP/MD if she would like.

## 2023-08-25 ENCOUNTER — Other Ambulatory Visit: Payer: Self-pay | Admitting: Family Medicine

## 2023-08-30 NOTE — Progress Notes (Unsigned)
 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  Patient discharged home with instruction to follow-up with PCP in 10 days.  Arrangement has been made to provide oxygen supplementation at discharge.    History of present illness:  As per H&P written by Dr. Allena Katz on 06/08/2018 78 y.o. female with medical history significant for HTN, HLD, and OA presents the ED with about 1 week of cough productive of white sputum.  She has had intermittent episodes of feeling "woozy" but denies any other associated symptoms including fevers, diaphoresis, dyspnea, chest pain, palpitations, abdominal pain, or dysuria.  She has noted some swelling in her right leg.  She does report snoring at night says that her husband has noticed that she has occasional episodes where she stops breathing while sleeping.  She also reports daytime fatigue requiring naps.  She was referred for a sleep study however rescheduled it.  She denies any personal history of lung disease including asthma or COPD.  She is a never smoker but says she has occasional exposure to secondhand smoke.   Hospital Course:  1-hypoxia: In the setting of obesity hypoventilation syndrome, obstructive sleep apnea and acute reactive airway from bronchitis. -Patient with good response to the use of oxygen supplementation (2 L nasal cannula supplementation), initiation of antibiotics and the use of albuterol nebulizer. -Patient advised on the importance for weight loss, outpatient sleep study evaluation with initiation on CPAP if required and also  depending clinical response evaluation with pulmonary function test. -Discharged on Zithromax to complete a total of 5 days, Flonase and also as needed albuterol inhaler.     2-morbid obesity: With obstructive sleep apnea -Outpatient evaluation as mentioned above for sleep study and CPAP initiation. -Low calorie diet, portion control and increase physical activity to assist with weight loss. -Patient will benefit of referral to bariatric clinic to further assist with weight loss plans. -Body mass index is 45.73 kg/m.         History of Present Illness  11/11/2019  Pulmonary/ 1st office eval/Patricia Fuentes    Chief Complaint  Patient presents with   Pulmonary Consult    Referred by Dr Syliva Overman. Former patient of Dr Juanetta Gosling. She is using CPAP with 2lpm o2 with sleep. No o2 during the day. Breathing is overall doing well. She rarely uses albuterol.   Dyspnea:  MMRC1 = can walk nl pace, flat grade, can't hurry or go uphills or steps s legs give out first  Cough: none Sleep: cpap bed flat and 2 pillows / feels like rested most days  SABA use: maybe once a week at most when overdoes it New R > L leg swelling x weeks or a month or so, admits to eating lots of potato chips, no calf pain  Rec All calls related to sleep and sleep equipment should go to Dr Vickey Huger  Only use your albuterol as a rescue medication  Try albuterol 15 min before an activity that you know would make you short of breath and see if it makes any  difference and if makes none then don't take it after activity unless you can't catch your breath. Avoid all salt and if the swelling doesn't improve see Dr Lodema Hong (? Due to amlopidine)  To get the most out of exercise, you need to be continuously aware that you are short of breath, but never out of breath, for 30 minutes daily.  Return if breathing worse or need more albuterol.   11/27/2022  Re-establish ov/[place] office/Patricia Fuentes re: enlarged PA  maint on no resp rx/ just 2lpm at  hs  Chief Complaint  Patient presents with   Consult  Not using cpap x 6 months - mask issues/ has not contacted prescriber or DME (Dr D[facility]ier/[employer]) Dyspnea:  walks at [employer] slower than others/ variable 02 sats, fatigue is worse than doe  Cough: comes and goes x months no pattern  / variabilty min productive  Sleeping: on back bed is flat 2 big pillows wakes up with HA and tired in am/ mild hypersomnolence daytie SABA use: none   02: 02 2lpm hs only  Rec My office will be contacting you by phone for referral to Dr Vickey Huger   - if you don't hear back from my office within one week please call us back or notify us thru MyChart and we'll address it right away.  In the meantime we will order you an overnight oxygen level while you are on oxygen. > not done as of 09/01/2023    09/01/2023  f/u ov/[place] office/Patricia Fuentes re: MO/ OSA  maint on 2lpm hs  / singulair only pulmonary med  Chief Complaint  Patient presents with   Shortness of Breath   Dyspnea:  walking on job/ [employer] slower pace than others = MMRC2 = can't walk a nl pace on a flat grade s sob but does fine slow and flat   Cough: none  Sleeping: bed is flat / 2 pillows   resp cc  SABA use: none  02: 2lpm at hs    No obvious day to day or daytime variability or assoc excess/ purulent sputum or mucus plugs or hemoptysis or cp or chest tightness, subjective wheeze or overt sinus or hb symptoms.    Also denies any obvious fluctuation of symptoms with weather or environmental changes or other aggravating or alleviating factors except as outlined above   No unusual exposure hx or h/o childhood pna/ asthma or knowledge of premature birth.  Current Allergies, Complete Past Medical History, Past Surgical History, Family History, and Social History were reviewed in [employer] record.  ROS  The following are not active complaints unless bolded Hoarseness, sore throat, dysphagia, dental problems, itching,  sneezing,  nasal congestion or discharge of excess mucus or purulent secretions, ear ache,   fever, chills, sweats, unintended wt loss or wt gain, classically pleuritic or exertional cp,  orthopnea pnd or arm/hand swelling  or leg swelling, presyncope, palpitations, abdominal pain, anorexia, nausea, vomiting, diarrhea  or change in bowel habits or change in bladder habits, change in stools or change in urine, dysuria, hematuria,  rash, arthralgias, visual complaints, headache, numbness, weakness or ataxia or problems with walking or coordination,  change in mood or  memory.        Current Meds  Medication Sig   amLODipine (NORVASC) 10 MG tablet Take 1 tablet (10 mg total) by mouth daily.   Cholecalciferol (VITAMIN D3) 50 MCG (2000 UT) capsule Take 1 capsule (2,000 Units total) by mouth daily.   furosemide (  LASIX) 20 MG tablet Take 1 tablet (20 mg total) by mouth daily.   montelukast (SINGULAIR) 10 MG tablet Take one tabl;et at bedtime for allergies, cough, wheeze, as needed   naproxen sodium (ALEVE) 220 MG tablet Take 220 mg by mouth daily as needed (pain).   OXYGEN Inhale 2 L into the lungs at bedtime as needed.   potassium chloride SA (KLOR-CON M) 20 MEQ tablet Take 1 tablet (20 mEq total) by mouth daily.   tirzepatide (ZEPBOUND) 2.5 MG/0.5ML Pen Inject 2.5 mg into the skin once a week.                Past Medical History:  Diagnosis Date   Bronchitis    Chronic back pain    Essential hypertension    Hyperlipidemia    IGT (impaired glucose tolerance) 2015   Obesity, unspecified    Osteoarthritis    Seasonal allergies        Objective:    Wts  09/01/2023         237  11/27/2022         234  11/11/19 245 lb (111.1 kg)  09/06/19 243 lb (110.2 kg)  03/10/19 243 lb (110.2 kg)    Vital signs reviewed  09/01/2023  - Note at rest 02 sats  94% on RA   General appearance:    amb pleasant bf/MO (by BMI) with  slt waddle   HEENT : Oropharynx  clear/ no teeth     Nasal turbinates nl    NECK :  without  apparent JVD/ palpable Nodes/TM   LUNGS: no acc muscle use,  Nl contour chest which is clear to A and P bilaterally without cough on insp or exp maneuvers   CV:  RRR  no s3 or murmur or increase in P2, and trace pitting edema both LEs  ABD:  soft and nontender   MS:    ext warm without deformities Or obvious joint restrictions  calf tenderness, cyanosis or clubbing    SKIN: warm and dry without lesions    NEURO:  alert, approp, nl sensorium with  no motor or cerebellar deficits apparent.       I personally reviewed images and agree with radiology impression as follows:   Chest CTa   10/07/22    1. No evidence of pulmonary embolism or other acute findings in the chest. 2. Stable mild dilatation of the central pulmonary arteries  3. Stable 4 mm fissural nodule in the right minor fissure likely represents an intrapulmonary lymph node and is stable since 2019. 4. Stable mild atherosclerosis of the thoracic aorta. 5. Stable top-normal heart size. 6. Scarring in the lingula.  Echo 10/08/22  G1 diastolic dysfunction and nl RV with est RAP = 15 but RA nl size          Assessment

## 2023-09-01 ENCOUNTER — Encounter: Payer: Self-pay | Admitting: Internal Medicine

## 2023-09-01 ENCOUNTER — Ambulatory Visit: Admitting: Internal Medicine

## 2023-09-01 VITALS — BP 121/69 | HR 85 | Ht 62.0 in | Wt 237.0 lb

## 2023-09-01 DIAGNOSIS — G4733 Obstructive sleep apnea (adult) (pediatric): Secondary | ICD-10-CM

## 2023-09-01 DIAGNOSIS — R0609 Other forms of dyspnea: Secondary | ICD-10-CM

## 2023-09-01 NOTE — Patient Instructions (Addendum)
 My office will be contacting you by phone for referral to [facility] and see if we can get you the mask of choice (take it with you to see Dr D)  - if you don't hear back from my office within one week please call us back or notify us thru MyChart and we'll address it right away.    6-8 inch bed blocks  We will walk you today to see if your qualify for portable 02   Pulmonary follow up is as needed .

## 2023-09-01 NOTE — Assessment & Plan Note (Signed)
 rx since 05/2018 by Dr Vickey Huger -  referred back to DR Dohemeir for optimal OSA rx and to sort out once this accomplished whether she really needs noct 02 at all   Each maintenance medication was reviewed in detail including emphasizing most importantly the difference between maintenance and prns and under what circumstances the prns are to be triggered using an action plan format where appropriate.  Total time for H and P, chart review, counseling,  directly observing portions of ambulatory 02 saturation study/ and generating customized AVS unique to this office visit / same day charting = 30 min final summary f/u ov

## 2023-09-01 NOTE — Assessment & Plan Note (Addendum)
 Onset with wt gain ? year Echo 07/09/2018 :  diastolic dysfunction / mild RVE/   LA=RA= mild dilation / small PF0 L > R  - PFT's  11/11/2018  FEV1 1.26 (76 % ) ratio 0.77  p 0 % improvement from saba p ? prior to study with FV curve minimal concavity and ERV  7% at wt 235   Chest CTa   10/07/22    1. No evidence of pulmonary embolism or other acute findings in the chest. 2. Stable mild dilatation of the central pulmonary arteries   Echo 10/08/22  G1 diastolic dysfunction and nl RV with est RAP = 15 but RA nl size - 11/27/2022   Walked on RA  x  2  lap(s) =  approx 300  ft  @ mod pace, stopped due to "legs tired" with lowest 02 sats 96% and no sob   - 09/01/2023   Walked on RA  x  3  lap(s) =  approx 450  ft  @ moderate pace, stopped due to end of study  with lowest 02 sats 90% with mild sob    Combination of mod obesity and and diastolic dysfunction with deconditioning but not need for 02 on bronchodilators at present and once her OSA / noct hypoxemia issues get addressed with approp cpap may eliminate need for 02 and should improve her daytime function as well as reduce risk of PH (WHO 3, which is not present yet) .  Discussed in detail all the  indications, usual  risks and alternatives  relative to the benefits with patient who agrees to proceed with Rx as outlined.

## 2023-09-04 NOTE — Progress Notes (Signed)
 Patricia Fuentes     MRN: [id number]      DOB: 02-29-1946  Chief Complaint  Patient presents with   Hypertension    3 month follow up. States her cpap machine is messed up and she has to see pulmonary on 4/7 to get a new order     HPI Patricia Fuentes is here for follow up and re-evaluation of chronic medical conditions, medication management and review of any available recent lab and radiology data.  Preventive health is updated, specifically  Cancer screening and Immunization.   Questions or concerns regarding consultations or procedures which the PT has had in the interim are  addressed. The PT denies any adverse reactions to current medications since the last visit.  There are no new concerns.  There are no specific complaints   ROS Denies recent fever or chills. Denies sinus pressure, nasal congestion, ear pain or sore throat. Denies chest congestion, productive cough or wheezing. Denies chest pains, palpitations and leg swelling Denies abdominal pain, nausea, vomiting,diarrhea or constipation.   Denies dysuria, frequency, hesitancy or incontinence. . Denies headaches, seizures, numbness, or tingling. Denies depression, anxiety or insomnia. Denies skin break down or rash.   PE  BP 114/66   Pulse 72   Resp 16   Ht 5\' 2"  (1.575 m)   Wt 237 lb 12.8 oz (107.9 kg)   SpO2 94%   BMI 43.49 kg/m   Patient alert and oriented and in no cardiopulmonary distress.  HEENT: No facial asymmetry, EOMI,     Neck supple .  Chest: Clear to auscultation bilaterally.  CVS: S1, S2 no murmurs, no S3.Regular rate.  ABD: Soft non tender.   Ext: No edema  MS: Adequate though reduced ROM spine, shoulders, hips and knees.  Skin: Intact, no ulcerations or rash noted.  Psych: Good eye contact, normal affect. Memory intact not anxious or depressed appearing.  CNS: CN 2-12 intact, power,  normal throughout.no focal deficits noted.   Assessment & Plan  OSA on CPAP Managed by  pulmonary upcoming appt for supplies and re eval  Prediabetes Patient educated about the importance of limiting  Carbohydrate intake , the need to commit to daily physical activity for a minimum of 30 minutes , and to commit weight loss. The fact that changes in all these areas will reduce or eliminate all together the development of diabetes is stressed.  Deteriorated     Latest Ref Rng & Units 08/08/2023    1:16 PM 05/16/2023    2:53 PM 02/13/2023    2:07 PM 10/08/2022    4:24 AM 10/07/2022    2:28 PM  Diabetic Labs  HbA1c 4.8 - 5.6 % 6.2   6.0     Chol 100 - 199 mg/dL 696   295     HDL >28 mg/dL 65   63     Calc LDL 0 - 99 mg/dL 413   244     Triglycerides 0 - 149 mg/dL 52   73     Creatinine 0.57 - 1.00 mg/dL 0.10  2.72  5.36  6.44  0.73       09/01/2023   10:45 AM 08/13/2023    2:38 PM 05/16/2023    2:02 PM 02/12/2023    4:32 PM 01/08/2023    3:04 PM 01/02/2023    4:35 PM 11/27/2022    3:36 PM  BP/Weight  Systolic BP 121 114 129 129 121 145 120  Diastolic BP 69 66 72 58  72 56 67  Wt. (Lbs) 237 237.8 233.12 233.12  239 234  BMI 43.35 kg/m2 43.49 kg/m2 42.64 kg/m2 42.64 kg/m2  43.71 kg/m2 42.8 kg/m2       No data to display            Essential hypertension Controlled, no change in medication DASH diet and commitment to daily physical activity for a minimum of 30 minutes discussed and encouraged, as a part of hypertension management. The importance of attaining a healthy weight is also discussed.     09/01/2023   10:45 AM 08/13/2023    2:38 PM 05/16/2023    2:02 PM 02/12/2023    4:32 PM 01/08/2023    3:04 PM 01/02/2023    4:35 PM 11/27/2022    3:36 PM  BP/Weight  Systolic BP 121 114 129 129 121 145 120  Diastolic BP 69 66 72 58 72 56 67  Wt. (Lbs) 237 237.8 233.12 233.12  239 234  BMI 43.35 kg/m2 43.49 kg/m2 42.64 kg/m2 42.64 kg/m2  43.71 kg/m2 42.8 kg/m2       Obesity with alveolar hypoventilation and body mass index (BMI) of 40 or greater (HCC)  Patient  re-educated about  the importance of commitment to a  minimum of 150 minutes of exercise per week as able.  The importance of healthy food choices with portion control discussed, as well as eating regularly and within a 12 hour window most days. The need to choose "clean , green" food 50 to 75% of the time is discussed, as well as to make water the primary drink and set a goal of 64 ounces water daily.       09/01/2023   10:45 AM 08/13/2023    2:38 PM 05/16/2023    2:02 PM  Weight /BMI  Weight 237 lb 237 lb 12.8 oz 233 lb 1.9 oz  Height 5\' 2"  (1.575 m) 5\' 2"  (1.575 m) 5\' 2"  (1.575 m)  BMI 43.35 kg/m2 43.49 kg/m2 42.64 kg/m2    Unchanged , needs to work on weight loss, zepbound prescribed pt has OSA

## 2023-09-04 NOTE — Assessment & Plan Note (Signed)
 Controlled, no change in medication

## 2023-09-04 NOTE — Assessment & Plan Note (Signed)
 Managed by pulmonary upcoming appt for supplies and re eval

## 2023-09-04 NOTE — Assessment & Plan Note (Signed)
  Patient re-educated about  the importance of commitment to a  minimum of 150 minutes of exercise per week as able.  The importance of healthy food choices with portion control discussed, as well as eating regularly and within a 12 hour window most days. The need to choose "clean , green" food 50 to 75% of the time is discussed, as well as to make water the primary drink and set a goal of 64 ounces water daily.       09/01/2023   10:45 AM 08/13/2023    2:38 PM 05/16/2023    2:02 PM  Weight /BMI  Weight 237 lb 237 lb 12.8 oz 233 lb 1.9 oz  Height 5\' 2"  (1.575 m) 5\' 2"  (1.575 m) 5\' 2"  (1.575 m)  BMI 43.35 kg/m2 43.49 kg/m2 42.64 kg/m2    Unchanged , needs to work on weight loss, zepbound prescribed pt has OSA

## 2023-09-04 NOTE — Assessment & Plan Note (Addendum)
 Patient educated about the importance of limiting  Carbohydrate intake , the need to commit to daily physical activity for a minimum of 30 minutes , and to commit weight loss. The fact that changes in all these areas will reduce or eliminate all together the development of diabetes is stressed.  Deteriorated     Latest Ref Rng & Units 08/08/2023    1:16 PM 05/16/2023    2:53 PM 02/13/2023    2:07 PM 10/08/2022    4:24 AM 10/07/2022    2:28 PM  Diabetic Labs  HbA1c 4.8 - 5.6 % 6.2   6.0     Chol 100 - 199 mg/dL 161   096     HDL >04 mg/dL 65   63     Calc LDL 0 - 99 mg/dL 540   981     Triglycerides 0 - 149 mg/dL 52   73     Creatinine 0.57 - 1.00 mg/dL 1.91  4.78  2.95  6.21  0.73       09/01/2023   10:45 AM 08/13/2023    2:38 PM 05/16/2023    2:02 PM 02/12/2023    4:32 PM 01/08/2023    3:04 PM 01/02/2023    4:35 PM 11/27/2022    3:36 PM  BP/Weight  Systolic BP 121 114 129 129 121 145 120  Diastolic BP 69 66 72 58 72 56 67  Wt. (Lbs) 237 237.8 233.12 233.12  239 234  BMI 43.35 kg/m2 43.49 kg/m2 42.64 kg/m2 42.64 kg/m2  43.71 kg/m2 42.8 kg/m2       No data to display

## 2023-09-04 NOTE — Assessment & Plan Note (Signed)
 Controlled, no change in medication DASH diet and commitment to daily physical activity for a minimum of 30 minutes discussed and encouraged, as a part of hypertension management. The importance of attaining a healthy weight is also discussed.     09/01/2023   10:45 AM 08/13/2023    2:38 PM 05/16/2023    2:02 PM 02/12/2023    4:32 PM 01/08/2023    3:04 PM 01/02/2023    4:35 PM 11/27/2022    3:36 PM  BP/Weight  Systolic BP 121 114 129 129 121 145 120  Diastolic BP 69 66 72 58 72 56 67  Wt. (Lbs) 237 237.8 233.12 233.12  239 234  BMI 43.35 kg/m2 43.49 kg/m2 42.64 kg/m2 42.64 kg/m2  43.71 kg/m2 42.8 kg/m2

## 2023-10-08 ENCOUNTER — Ambulatory Visit: Admitting: Primary Care

## 2023-10-08 VITALS — BP 113/69 | HR 75 | Ht 62.0 in | Wt 236.4 lb

## 2023-10-08 DIAGNOSIS — G4733 Obstructive sleep apnea (adult) (pediatric): Secondary | ICD-10-CM | POA: Diagnosis not present

## 2023-10-08 DIAGNOSIS — Z6841 Body Mass Index (BMI) 40.0 and over, adult: Secondary | ICD-10-CM | POA: Diagnosis not present

## 2023-10-08 DIAGNOSIS — R0902 Hypoxemia: Secondary | ICD-10-CM

## 2023-10-08 DIAGNOSIS — Z9981 Dependence on supplemental oxygen: Secondary | ICD-10-CM

## 2023-10-08 DIAGNOSIS — E669 Obesity, unspecified: Secondary | ICD-10-CM

## 2023-10-08 NOTE — Patient Instructions (Addendum)
 VISIT SUMMARY: Today, we discussed your obstructive sleep apnea and the issues you have been experiencing with your CPAP machine. We also talked about your interest in the Inspire therapy and the steps needed to qualify for it. Additionally, we reviewed your nocturnal hypoxemia and the importance of weight loss for your overall health and potential treatment options.  YOUR PLAN: -OBSTRUCTIVE SLEEP APNEA: Obstructive sleep apnea is a condition where your airway becomes blocked during sleep, causing breathing interruptions. Your CPAP machine has been broken for over six months, leading to poor sleep quality and other symptoms. We will order a new CPAP machine with your previous settings (8-16 cm H2O) and consider a repeat sleep study if needed. We also provided information on the Inspire device, which is a surgical option that requires a BMI of 40 or less. We will follow up in 2-3 months to assess your progress with the CPAP machine.  -NOCTURNAL HYPOXEMIA: Nocturnal hypoxemia is a condition where your oxygen  levels drop significantly at night. This is associated with your sleep apnea and requires you to use supplemental oxygen . Using the CPAP machine along with your nocturnal oxygen  may help improve your oxygen  levels and reduce stress on your heart.  -OBESITY: Your current BMI is 43, which is above the threshold for qualifying for the [facility] device. Losing 15-20 pounds will help you meet the BMI requirement and may also improve your sleep apnea and overall health. We encourage you to work on weight loss to achieve these benefits.  INSTRUCTIONS: Follow up in 2-3 months to assess your progress with the CPAP machine. Consider a repeat sleep study if required for the new CPAP machine or Inspire candidacy. Work on losing 15-20 pounds to qualify for the [employer] device and improve your overall health.  Orders: DME order for new CPAP machine   Follow-up 2-3 months CPAP compliance with APP in [place]    Chronic Respiratory Failure Chronic respiratory failure is a long-term type of respiratory failure. Respiratory failure is when one or both of these things happen: Oxygen  cannot pass from the lungs into the blood, causing the blood oxygen  level to drop. Loss of blood oxygen  means tissues and organs may not work well. A gas called carbon dioxide cannot pass from the blood into the lungs so the body can get rid of it. The buildup of carbon dioxide can damage the tissues and organs in the body. Chronic respiratory failure may develop over weeks, months, or years. It is usually the body's response to another long-term (chronic) condition that affects breathing. You may also get sudden shortness of breath (acute respiratory failure) when you have chronic respiratory failure. Sudden shortness of breath needs to be treated right away. What increases the risk? You are more likely to develop this condition if you have another chronic condition, such as: Chronic obstructive pulmonary disease (COPD). Cystic fibrosis. Neuromuscular weakness from a stroke, spinal cord injury, or myasthenia gravis. Pulmonary fibrosis. This is scarring of the lung tissue. Sleep apnea. Obesity-hypoventilation syndrome. Congestive heart failure. What are the signs or symptoms? Chronic respiratory failure may not have clear symptoms when it happens slowly over time and the body gets used to it. Symptoms of this condition may include: Shortness of breath or trouble breathing. Feeling very tired, sleepy, or low energy. Blurred vision or headaches. Fast breathing. Confusion. Fingernail beds or toenail beds that look bluish. Fingernails or toenails that look wide, swollen, spongy, or bulging. How is this diagnosed? This condition may be diagnosed based on: Your  medical history. A physical exam. Other tests. These may include: Blood tests, such as an arterial blood gas test to check oxygen  and carbon dioxide  levels. Imaging tests, such as a chest X-ray or CT scan. Pulmonary function tests. These help to find out if you have chronic lung disease. An echocardiogram. This test uses sound waves to make pictures of your heart. How is this treated? Treatment for this condition depends on the cause. Treatment may include: Oxygen  given through a tube with prongs that sit in your nose (nasal cannula) or through a mask that fits over your face. Noninvasive positive pressure ventilation. This is breathing support using a machine to blow air into your lungs through a mask. Examples of these machines are: Continuous positive airway pressure (CPAP) machine. Bilevel positive airway pressure (BIPAP) machine. Medicines to help with breathing, such as: Medicines that open up and relax air passages, such as bronchodilators. These may be given through a device that turns liquid medicines into a mist you can breathe in (nebulizer). Diuretics. These medicines get rid of extra fluid in your lungs. Steroid medicines. These decrease inflammation in the lungs. Pulmonary rehabilitation. This is an exercise program that strengthens the muscles in your chest and helps you learn breathing methods to manage your condition. A ventilator. This is a breathing machine that sends oxygen  to your lungs through a tube that is put into your windpipe (trachea). This machine is used when you can no longer breathe well enough on your own. Follow these instructions at home: Medicines Take over-the-counter and prescription medicines only as told by your health care provider. If you were prescribed antibiotics, take them as told by your health care provider. Do not stop using the antibiotic even if you start to feel better. General instructions  Do not use any products that contain nicotine or tobacco. These products include cigarettes, chewing tobacco, and vaping devices, such as e-cigarettes. If you need help quitting, ask your health care  provider. If you were told to use oxygen  therapy at home, follow the instructions from your health care provider. Do not use more oxygen  than you were told. Getting too much oxygen  may be harmful when you have this condition. If you were given a CPAP or BIPAP machine, follow the instructions for use, cleaning, and care as told by your health care provider. Return to your normal activities as told by your health care provider. Ask your health care provider what activities are safe for you. Stay up to date on all vaccines, especially pneumonia and yearly flu (influenza) vaccines. Stay away from people who are sick, and avoid crowded places, especially during the cold and flu season. Keep all follow-up visits. Contact a health care provider if: Your shortness of breath gets worse. You have more mucus from your lungs (sputum), high-pitched whistling sounds when you breathe (wheezing), coughing, or loss of energy. You get oxygen  therapy and you still have trouble breathing. You have a fever of 100.33F (38C) or higher. Get help right away if: Your shortness of breath gets much worse or gets worse very fast. You get pain, tightness, or pressure in your chest. You cannot say more than a few words without having to catch your breath. Your lips, toenails, or fingernails look bluish. You become confused or difficult to wake up. These symptoms may be an emergency. Get help right away. Call 911. Do not wait to see if the symptoms will go away. Do not drive yourself to the hospital. Summary Chronic respiratory  failure is a long-term breathing problem that may develop over weeks, months, or years. It is usually the body's response to another long-term condition that affects breathing. This condition may not have clear symptoms when it happens slowly over time and the body gets used to it. Shortness of breath and trouble breathing are the most common symptoms of this condition. If you were told to use  oxygen  therapy at home, do not use more oxygen  than you were told. Getting too much oxygen  may be harmful when you have this condition. This information is not intended to replace advice given to you by your health care provider. Make sure you discuss any questions you have with your health care provider. Document Revised: 07/20/2021 Document Reviewed: 07/20/2021 Elsevier Patient Education  2024 [employer].

## 2023-10-08 NOTE — Progress Notes (Signed)
 @Patient  ID: Patricia Fuentes, female    DOB: 1945-07-20, 78 y.o.   MRN: [id number]  Chief Complaint  Patient presents with   Sleep Apnea    Referring provider: Towanda Fret, MD  HPI: 78 year old female, never smoked. PMH significant for HTN, OSA on CPAP, reactive airway disease, chronic cough, hyperlipidemia, hypersomnia, obesity. Followed by Dr. Waymond Hailey, last seen on 09/01/23.  Onset with wt gain ? year Echo 07/09/2018 :  diastolic dysfunction / mild RVE/   LA=RA= mild dilation / small PF0 L > R  - PFT's  11/11/2018  FEV1 1.26 (76 % ) ratio 0.77  p 0 % improvement from saba p ? prior to study with FV curve minimal concavity and ERV  7% at wt 235   Chest CTa   10/07/22    1. No evidence of pulmonary embolism or other acute findings in the chest. 2. Stable mild dilatation of the central pulmonary arteries   Echo 10/08/22  G1 diastolic dysfunction and nl RV with est RAP = 15 but RA nl size - 11/27/2022   Walked on RA  x  2  lap(s) =  approx 300  ft  @ mod pace, stopped due to "legs tired" with lowest 02 sats 96% and no sob   - 09/01/2023   Walked on RA  x  3  lap(s) =  approx 450  ft  @ moderate pace, stopped due to end of study  with lowest 02 sats 90% with mild sob    10/08/2023 Discussed the use of AI scribe software for clinical note transcription with the patient, who gave verbal consent to proceed.  History of Present Illness   Patricia Fuentes is a 78 year old female with sleep apnea who presents for a follow-up regarding her CPAP machine and consideration for Inspire therapy. She was referred by Dr. Waymond Hailey for evaluation of her sleep apnea.  She has not used her CPAP machine for over six months due to it being broken. The machine was provided by [employer], her medical supply store. She finds the CPAP mask obtrusive and experiences claustrophobia, particularly when turning over in bed. She has not worn the CPAP since January 2025.  She experiences increased headaches and disrupted sleep,  stating she is not really sleeping and frequently wakes up. Her oxygen  levels have dropped significantly at night, which she attributes to her sleep apnea. She currently uses oxygen  at night. She has no daytime oxygen  requirements.   Her last sleep study in 2020 indicated moderate sleep apnea. She is currently not using any CPAP machine due to the broken equipment and is unsure of her previous CPAP pressure settings, though she recalls it might have been between 8 to 16 cm H2O. She is open to retrying CPAP therapy and is considering different mask options that might be less obtrusive.  She is interested in [place] therapy but her current BMI of 43 exceeds the requirement for the procedure, which is a BMI of 40 or less. She is 5'2" and weighs 236 pounds, and would need to lose approximately 15 pounds to be considered for the procedure.      Allergies  Allergen Reactions   Other    Promethazine Hcl Nausea And Vomiting and Other (See Comments)    dizzy    Immunization History  Administered Date(s) Administered   Fluad Quad(high Dose 65+) 03/10/2019, 03/02/2021, 05/10/2022   Fluad Trivalent(High Dose 65+) 02/12/2023   Influenza, High Dose Seasonal PF  05/05/2018   Influenza,inj,Quad PF,6+ Mos 06/22/2013, 07/18/2014, 07/04/2015, 03/21/2016, 02/11/2017, 01/19/2020   Moderna Sars-Covid-2 Vaccination 06/19/2019, 07/19/2019, 04/13/2020   PPD Test 01/15/2018   Pneumococcal Conjugate-13 12/27/2013   Pneumococcal Polysaccharide-23 12/15/2012    Past Medical History:  Diagnosis Date   Bronchitis    Chronic back pain    Essential hypertension    Hyperlipidemia    IGT (impaired glucose tolerance) 2015   Obesity, unspecified    Osteoarthritis    Seasonal allergies     Tobacco History: Social History   Tobacco Use  Smoking Status Never  Smokeless Tobacco Never   Counseling given: Not Answered   Outpatient Medications Prior to Visit  Medication Sig Dispense Refill   amLODipine   (NORVASC ) 10 MG tablet Take 1 tablet (10 mg total) by mouth daily. 90 tablet 1   Cholecalciferol  (VITAMIN D3) 50 MCG (2000 UT) capsule Take 1 capsule (2,000 Units total) by mouth daily. 90 capsule 3   diclofenac  Sodium (VOLTAREN ) 1 % GEL Apply to neck 3 times weekly , as needed, for pain 350 g 1   furosemide  (LASIX ) 20 MG tablet Take 1 tablet (20 mg total) by mouth daily. 90 tablet 3   montelukast  (SINGULAIR ) 10 MG tablet Take one tabl;et at bedtime for allergies, cough, wheeze, as needed 30 tablet 2   naproxen sodium (ALEVE) 220 MG tablet Take 220 mg by mouth daily as needed (pain).     OXYGEN  Inhale 2 L into the lungs at bedtime as needed.     potassium chloride  SA (KLOR-CON  M) 20 MEQ tablet Take 1 tablet (20 mEq total) by mouth daily. 90 tablet 3   tirzepatide  (ZEPBOUND ) 2.5 MG/0.5ML Pen Inject 2.5 mg into the skin once a week. 2 mL 0   No facility-administered medications prior to visit.   Review of Systems  Review of Systems  Constitutional: Negative.   HENT: Negative.    Respiratory: Negative.     Physical Exam  BP 113/69 (BP Location: Left Arm)   Pulse 75   Ht 5\' 2"  (1.575 m)   Wt 236 lb 6.4 oz (107.2 kg)   SpO2 94% Comment: RA  BMI 43.24 kg/m  Physical Exam Constitutional:      General: She is not in acute distress.    Appearance: Normal appearance. She is not ill-appearing.  HENT:     Head: Normocephalic and atraumatic.  Cardiovascular:     Rate and Rhythm: Normal rate and regular rhythm.  Pulmonary:     Effort: Pulmonary effort is normal.     Breath sounds: Normal breath sounds. No wheezing or rhonchi.  Skin:    General: Skin is warm and dry.  Neurological:     General: No focal deficit present.     Mental Status: She is alert and oriented to person, place, and time. Mental status is at baseline.  Psychiatric:        Mood and Affect: Mood normal.        Behavior: Behavior normal.        Thought Content: Thought content normal.        Judgment: Judgment  normal.      Lab Results:  CBC    Component Value Date/Time   WBC 7.5 10/09/2022 0433   RBC 4.09 10/09/2022 0433   HGB 11.7 (L) 10/09/2022 0433   HGB 11.9 12/11/2021 1525   HCT 36.6 10/09/2022 0433   HCT 35.8 12/11/2021 1525   PLT 174 10/09/2022 0433   PLT 204 12/11/2021 1525  MCV 89.5 10/09/2022 0433   MCV 86 12/11/2021 1525   MCH 28.6 10/09/2022 0433   MCHC 32.0 10/09/2022 0433   RDW 14.1 10/09/2022 0433   RDW 13.1 12/11/2021 1525   LYMPHSABS 0.8 10/09/2022 0433   MONOABS 0.8 10/09/2022 0433   EOSABS 0.0 10/09/2022 0433   BASOSABS 0.0 10/09/2022 0433    BMET    Component Value Date/Time   NA 142 08/08/2023 1316   K 4.0 08/08/2023 1316   CL 99 08/08/2023 1316   CO2 31 (H) 08/08/2023 1316   GLUCOSE 99 08/08/2023 1316   GLUCOSE 134 (H) 10/08/2022 0424   BUN 11 08/08/2023 1316   CREATININE 0.75 08/08/2023 1316   CREATININE 0.67 06/23/2018 1058   CALCIUM  8.7 08/08/2023 1316   GFRNONAA >60 10/08/2022 0424   GFRNONAA 89 06/23/2018 1058   GFRAA 98 07/07/2020 1134   GFRAA 103 06/23/2018 1058    BNP    Component Value Date/Time   BNP 31.0 10/07/2022 1428    ProBNP No results found for: "PROBNP"  Imaging: No results found.   Assessment & Plan:   1. OSA on CPAP (Primary) - Ambulatory Referral for DME  Assessment and Plan    Obstructive Sleep Apnea Moderate obstructive sleep apnea diagnosed in 2020. Her CPAP machine has been non-functional since January, exacerbating symptoms such as poor sleep quality, frequent awakenings, headaches, and daytime fatigue. She is interested in the [place] device but does not meet the BMI criteria. A new CPAP machine is necessary, and a repeat sleep study may be required to qualify for Inspire and to document the current severity of sleep apnea. Belva Boyden is a hypoglossal nerve stimulator implanted surgically, requiring a BMI of 40 or less, a sleep endoscopy, and the ability to use a remote control device.  - Order new CPAP  machine with previous settings (8-16 cm H2O). - Consider repeat split night sleep study if required for new CPAP machine or Inspire candidacy. - Provide information on Inspire device. - Discuss potential need for sleep endoscopy if pursuing Inspire. - Follow up in 2-3 months to assess progress with CPAP.  Nocturnal Hypoxemia Nocturnal hypoxemia associated with obstructive sleep apnea. Current daytime oxygen  levels are adequate, but nocturnal levels necessitating supplemental oxygen . Weight loss and CPAP use may improve oxygen  levels and reduce cardiac stress. - Ensure nocturnal oxygen  is blended with CPAP.  Obesity BMI is 43, exceeding the threshold for [facility] candidacy. Weight loss of 15-20 pounds is necessary to meet the BMI requirement for the [facility] device. Weight loss may also improve sleep apnea and overall health. - Encourage weight loss of 15-20 pounds to qualify for Inspire. - Discuss potential benefits of weight loss on sleep apnea and overall health.  Antonio Baumgarten, NP 10/08/2023

## 2023-10-21 ENCOUNTER — Ambulatory Visit: Admitting: Family Medicine

## 2023-10-22 ENCOUNTER — Ambulatory Visit (INDEPENDENT_AMBULATORY_CARE_PROVIDER_SITE_OTHER): Admitting: Family Medicine

## 2023-10-22 ENCOUNTER — Encounter: Payer: Self-pay | Admitting: Family Medicine

## 2023-10-22 VITALS — BP 136/80 | HR 79 | Resp 18 | Ht 62.0 in | Wt 231.1 lb

## 2023-10-22 DIAGNOSIS — Z6841 Body Mass Index (BMI) 40.0 and over, adult: Secondary | ICD-10-CM

## 2023-10-22 DIAGNOSIS — E66813 Obesity, class 3: Secondary | ICD-10-CM

## 2023-10-22 DIAGNOSIS — E785 Hyperlipidemia, unspecified: Secondary | ICD-10-CM | POA: Diagnosis not present

## 2023-10-22 DIAGNOSIS — R058 Other specified cough: Secondary | ICD-10-CM

## 2023-10-22 DIAGNOSIS — I10 Essential (primary) hypertension: Secondary | ICD-10-CM | POA: Diagnosis not present

## 2023-10-22 DIAGNOSIS — G4733 Obstructive sleep apnea (adult) (pediatric): Secondary | ICD-10-CM

## 2023-10-22 MED ORDER — MONTELUKAST SODIUM 10 MG PO TABS: ORAL_TABLET | ORAL | 2 refills | Status: DC

## 2023-10-22 MED ORDER — TIRZEPATIDE-WEIGHT MANAGEMENT 2.5 MG/0.5ML ~~LOC~~ SOAJ: 2.5000 mg | SUBCUTANEOUS | 0 refills | Status: AC

## 2023-10-22 NOTE — Patient Instructions (Signed)
 F/U in 4 months, call if you need me sooner  New is once weekly injection to help with weight loss , let me know if you are able to get it please  Follow up with Pulmonary office re sleep apnea treatment  Montelukast  is re sent for allergies  Medications are sent to [facility] per your request  Thanks for choosing [facility], we consider it a privelige to serve you.

## 2023-10-23 ENCOUNTER — Encounter: Payer: Self-pay | Admitting: Family Medicine

## 2023-10-23 NOTE — Assessment & Plan Note (Signed)
 Still awaiting siupplies, she is encouraged to reach out to Pulmonary

## 2023-10-23 NOTE — Assessment & Plan Note (Signed)
 Hyperlipidemia:Low fat diet discussed and encouraged.   Lipid Panel  Lab Results  Component Value Date   CHOL 187 08/08/2023   HDL 65 08/08/2023   LDLCALC 112 (H) 08/08/2023   TRIG 52 08/08/2023   CHOLHDL 2.9 08/08/2023     Needs to lower fat intake

## 2023-10-23 NOTE — Assessment & Plan Note (Signed)
 Controlled, no change in medication DASH diet and commitment to daily physical activity for a minimum of 30 minutes discussed and encouraged, as a part of hypertension management. The importance of attaining a healthy weight is also discussed.     10/22/2023    2:40 PM 10/08/2023    2:46 PM 09/01/2023   10:45 AM 08/13/2023    2:38 PM 05/16/2023    2:02 PM 02/12/2023    4:32 PM 01/08/2023    3:04 PM  BP/Weight  Systolic BP 136 113 121 114 129 129 121  Diastolic BP 80 69 69 66 72 58 72  Wt. (Lbs) 231.12 236.4 237 237.8 233.12 233.12   BMI 42.27 kg/m2 43.24 kg/m2 43.35 kg/m2 43.49 kg/m2 42.64 kg/m2 42.64 kg/m2

## 2023-10-23 NOTE — Assessment & Plan Note (Signed)
 Uncontrolled currently resume singulair 

## 2023-10-23 NOTE — Progress Notes (Signed)
 Patricia Fuentes     MRN: [id number]      DOB: 1946/04/01  Chief Complaint  Patient presents with   Hypertension    2 month follow up     HPI [name] is here for follow up and re-evaluation of chronic medical conditions, medication management and review of any available recent lab and radiology data.  Preventive health is updated, specifically  Cancer screening and Immunization.   Questions or concerns regarding consultations or procedures which the PT has had in the interim are  addressed. The PT denies any adverse reactions to current medications since the last visit.  There are no new concerns.  There are no specific complaints   ROS Denies recent fever or chills. Denies sinus pressure, nasal congestion, ear pain or sore throat. Denies chest congestion, productive cough or wheezing. Denies chest pains, palpitations and leg swelling Denies abdominal pain, nausea, vomiting,diarrhea or constipation.   Denies dysuria, frequency, hesitancy or incontinence. Chronic  joint pain, swelling and limitation in mobility. Denies headaches, seizures, numbness, or tingling. Denies depression, anxiety or insomnia. Denies skin break down or rash.   PE  BP 136/80   Pulse 79   Resp 18   Ht 5\' 2"  (1.575 m)   Wt 231 lb 1.9 oz (104.8 kg)   SpO2 90%   BMI 42.27 kg/m   Patient alert and oriented and in no cardiopulmonary distress.  HEENT: No facial asymmetry, EOMI,     Neck supple .  Chest: Clear to auscultation bilaterally.  CVS: S1, S2 no murmurs, no S3.Regular rate.  ABD: Soft non tender.   Ext: No edema  MS: decreased  ROM spine, shoulders, hips and knees.  Skin: Intact, no ulcerations or rash noted.  Psych: Good eye contact, normal affect. Memory intact not anxious or depressed appearing.  CNS: CN 2-12 intact, power,  normal throughout.no focal deficits noted.   Assessment & Plan  Class 3 severe obesity due to excess calories with serious comorbidity and body  mass index (BMI) of 40.0 to 44.9 in adult [facility]) Did not fill mounjaro  if available, did not know was prescribed! Patient re-educated about  the importance of commitment to a  minimum of 150 minutes of exercise per week as able.  The importance of healthy food choices with portion control discussed, as well as eating regularly and within a 12 hour window most days. The need to choose "clean , green" food 50 to 75% of the time is discussed, as well as to make water the primary drink and set a goal of 64 ounces water daily.       10/22/2023    2:40 PM 10/08/2023    2:46 PM 09/01/2023   10:45 AM  Weight /BMI  Weight 231 lb 1.9 oz 236 lb 6.4 oz 237 lb  Height 5\' 2"  (1.575 m) 5\' 2"  (1.575 m) 5\' 2"  (1.575 m)  BMI 42.27 kg/m2 43.24 kg/m2 43.35 kg/m2    Improved, she is applauded on this and encouraged to continue same  Essential hypertension Controlled, no change in medication DASH diet and commitment to daily physical activity for a minimum of 30 minutes discussed and encouraged, as a part of hypertension management. The importance of attaining a healthy weight is also discussed.     10/22/2023    2:40 PM 10/08/2023    2:46 PM 09/01/2023   10:45 AM 08/13/2023    2:38 PM 05/16/2023    2:02 PM 02/12/2023    4:32 PM 01/08/2023  3:04 PM  BP/Weight  Systolic BP 136 113 121 114 129 129 121  Diastolic BP 80 69 69 66 72 58 72  Wt. (Lbs) 231.12 236.4 237 237.8 233.12 233.12   BMI 42.27 kg/m2 43.24 kg/m2 43.35 kg/m2 43.49 kg/m2 42.64 kg/m2 42.64 kg/m2         Hyperlipidemia LDL goal <100 Hyperlipidemia:Low fat diet discussed and encouraged.   Lipid Panel  Lab Results  Component Value Date   CHOL 187 08/08/2023   HDL 65 08/08/2023   LDLCALC 112 (H) 08/08/2023   TRIG 52 08/08/2023   CHOLHDL 2.9 08/08/2023     Needs to lower fat intake  OSA on CPAP Still awaiting siupplies, she is encouraged to reach out to Pulmonary  Allergic cough Uncontrolled currently resume singulair 

## 2023-10-23 NOTE — Assessment & Plan Note (Addendum)
 Did not fill mounjaro  if available, did not know was prescribed! Patient re-educated about  the importance of commitment to a  minimum of 150 minutes of exercise per week as able.  The importance of healthy food choices with portion control discussed, as well as eating regularly and within a 12 hour window most days. The need to choose "clean , green" food 50 to 75% of the time is discussed, as well as to make water the primary drink and set a goal of 64 ounces water daily.       10/22/2023    2:40 PM 10/08/2023    2:46 PM 09/01/2023   10:45 AM  Weight /BMI  Weight 231 lb 1.9 oz 236 lb 6.4 oz 237 lb  Height 5\' 2"  (1.575 m) 5\' 2"  (1.575 m) 5\' 2"  (1.575 m)  BMI 42.27 kg/m2 43.24 kg/m2 43.35 kg/m2    Improved, she is applauded on this and encouraged to continue same

## 2023-10-28 ENCOUNTER — Telehealth: Payer: Self-pay | Admitting: Primary Care

## 2023-10-28 NOTE — Telephone Encounter (Signed)
 We have ordered the new CPAP and it was received by [employer]  I made the pt aware of this and advised her that she should call [employer] for them to provider her with update  Nothing further needed

## 2023-10-28 NOTE — Telephone Encounter (Signed)
 Copied from CRM [id number]. Topic: Clinical - Medication Question >> Oct 28, 2023 10:59 AM Jethro Morrison wrote: Reason for CRM: PT [id number] STATED HER CPAP MACHINE IS NOT WORKING AT ALL. AND STATED SOMEONE SUPPOSE TO CALL HER ABOUT IT BUT NO ONE HAS CALLED HER

## 2023-12-03 ENCOUNTER — Telehealth: Payer: Self-pay | Admitting: Primary Care

## 2023-12-03 NOTE — Telephone Encounter (Signed)
 Lvm for patient to call and discuss scheduling the 2-3 month follow up with Almarie Ferrari, NP--will offer [address] or [address]

## 2024-02-12 ENCOUNTER — Telehealth: Payer: Self-pay

## 2024-02-12 ENCOUNTER — Ambulatory Visit

## 2024-02-12 ENCOUNTER — Telehealth: Payer: Self-pay | Admitting: Family Medicine

## 2024-02-12 NOTE — Telephone Encounter (Signed)
 Please advise Copied from CRM [id number]. Topic: General - Other >> Feb 12, 2024 10:36 AM Travis F wrote: Reason for CRM: Patient is calling in because she had an annual wellness visit and missed the call twice. Patient says she has been waiting by the phone and the phone didn't ring. Please follow up with patient. >> Feb 12, 2024  2:37 PM Montie POUR wrote: I let Patricia Fuentes know that she should get a callback by the end of the day since she called before 3:00 PM. Please call her at [phone] about her appointment. Thanks

## 2024-02-12 NOTE — Telephone Encounter (Signed)
 Copied from CRM [id number]. Topic: General - Other >> Feb 12, 2024  8:27 AM Franky GRADE wrote: Reason for CRM: Patient is returning a call she received for her AWV, per appointment notes I informed patient that Salote Weidmann is completing other visits and will call back when she is able to.

## 2024-02-12 NOTE — Telephone Encounter (Signed)
 Copied from CRM [id number]. Topic: General - Other >> Feb 12, 2024 10:36 AM Travis F wrote: Reason for CRM: Patient is calling in because she had an annual wellness visit and missed the call twice. Patient says she has been waiting by the phone and the phone didn't ring. Please follow up with patient.

## 2024-02-13 NOTE — Telephone Encounter (Signed)
 The patient called in stating she missed a call. I called and spoke with Patricia Fuentes and she said the previous message was sent from [employer] to [employer]. Patricia Fuentes told me to route this directly to Abby Wingate so she can call the patient back which I will do. Please assist patient further.

## 2024-02-19 ENCOUNTER — Ambulatory Visit (INDEPENDENT_AMBULATORY_CARE_PROVIDER_SITE_OTHER): Payer: Self-pay

## 2024-02-19 VITALS — Ht 62.0 in | Wt 231.0 lb

## 2024-02-19 DIAGNOSIS — I10 Essential (primary) hypertension: Secondary | ICD-10-CM

## 2024-02-19 DIAGNOSIS — Z0000 Encounter for general adult medical examination without abnormal findings: Secondary | ICD-10-CM

## 2024-02-19 DIAGNOSIS — G4733 Obstructive sleep apnea (adult) (pediatric): Secondary | ICD-10-CM

## 2024-02-19 DIAGNOSIS — Z780 Asymptomatic menopausal state: Secondary | ICD-10-CM

## 2024-02-19 DIAGNOSIS — G4734 Idiopathic sleep related nonobstructive alveolar hypoventilation: Secondary | ICD-10-CM

## 2024-02-19 MED ORDER — BLOOD PRESSURE MONITOR 3 DEVI: 0 refills | Status: AC

## 2024-02-19 NOTE — Patient Instructions (Signed)
 [name],  Thank you for taking the time for your Medicare Wellness Visit. I appreciate your continued commitment to your health goals. Please review the care plan we discussed, and feel free to reach out if I can assist you further.  Medicare recommends these wellness visits once per year to help you and your care team stay ahead of potential health issues. These visits are designed to focus on prevention, allowing your provider to concentrate on managing your acute and chronic conditions during your regular appointments.  Please note that Annual Wellness Visits do not include a physical exam. Some assessments may be limited, especially if the visit was conducted virtually. If needed, we may recommend a separate in-person follow-up with your provider.  Ongoing Care Seeing your primary care provider every 3 to 6 months helps us  monitor your health and provide consistent, personalized care.   Referrals If a referral was made during today's visit and you haven't received any updates within two weeks, please contact the referred provider directly to check on the status.  Osteoporosis Screening: Please call the number below to schedule your appt. [facility] Phone: [phone]  Recommended Screenings:  Health Maintenance  Topic Date Due   DEXA scan (bone density measurement)  07/09/2020   Flu Shot  12/26/2023   COVID-19 Vaccine (4 - 2025-26 season) 01/26/2024   DTaP/Tdap/Td vaccine (1 - Tdap) 10/21/2024*   Zoster (Shingles) Vaccine (1 of 2) 01/04/2026*   Breast Cancer Screening  07/13/2024   Medicare Annual Wellness Visit  02/18/2025   Pneumococcal Vaccine for age over 29  Completed   Hepatitis C Screening  Completed   HPV Vaccine  Aged Out   Meningitis B Vaccine  Aged Out   Colon Cancer Screening  Discontinued  *Topic was postponed. The date shown is not the original due date.       02/19/2024    8:06 AM  Advanced Directives  Does Patient Have a  Medical Advance Directive? No  Would patient like information on creating a medical advance directive? No - Patient declined   Advance Care Planning is important because it: Ensures you receive medical care that aligns with your values, goals, and preferences. Provides guidance to your family and loved ones, reducing the emotional burden of decision-making during critical moments.  Vision: Annual vision screenings are recommended for early detection of glaucoma, cataracts, and diabetic retinopathy. These exams can also reveal signs of chronic conditions such as diabetes and high blood pressure.  Dental: Annual dental screenings help detect early signs of oral cancer, gum disease, and other conditions linked to overall health, including heart disease and diabetes.  Please see the attached documents for additional preventive care recommendations.

## 2024-02-19 NOTE — Progress Notes (Signed)
 Please attest and cosign this visit due to patients primary care provider not being immediately available at the time the visit was completed.   Subjective:   Patricia Fuentes is a 78 y.o. who presents for a Medicare Wellness preventive visit. As a reminder, Annual Wellness Visits don't include a physical exam, and some assessments may be limited, especially if this visit is performed virtually. We may recommend an in-person follow-up visit with your provider if needed.  Visit Complete: Virtual I connected with  LAUREN MODISETTE on 02/19/24 by a audio enabled telemedicine application and verified that I am speaking with the correct person using two identifiers.  Patient Location: Home  Provider Location: Home Office  I discussed the limitations of evaluation and management by telemedicine. The patient expressed understanding and agreed to proceed.  Vital Signs: Because this visit was a virtual/telehealth visit, some criteria may be missing or patient reported. Any vitals not documented were not able to be obtained and vitals that have been documented are patient reported.  Persons Participating in Visit: Patient.  AWV Questionnaire: No: Patient Medicare AWV questionnaire was not completed prior to this visit. Cardiac Risk Factors include: advanced age (>23men, >64 women);dyslipidemia;hypertension;obesity (BMI >30kg/m2)     Objective:    Today's Vitals   02/19/24 0816  Weight: 231 lb (104.8 kg)  Height: 5' 2 (1.575 m)   Body mass index is 42.25 kg/m.    02/19/2024    8:06 AM 01/02/2023    4:36 PM 10/07/2022    8:06 PM 10/07/2022    1:31 PM 10/03/2022    1:31 PM 03/25/2022    2:56 PM 10/01/2021    1:35 PM  Advanced Directives  Does Patient Have a Medical Advance Directive? No No  No No No No  Would patient like information on creating a medical advance directive? No - Patient declined No - Patient declined No - Patient declined  No - Patient declined  No - Patient declined     Current Medications (verified) Outpatient Encounter Medications as of 02/19/2024  Medication Sig   amLODipine  (NORVASC ) 10 MG tablet Take 1 tablet (10 mg total) by mouth daily.   Cholecalciferol  (VITAMIN D3) 50 MCG (2000 UT) capsule Take 1 capsule (2,000 Units total) by mouth daily.   diclofenac  Sodium (VOLTAREN ) 1 % GEL Apply to neck 3 times weekly , as needed, for pain   furosemide  (LASIX ) 20 MG tablet Take 1 tablet (20 mg total) by mouth daily.   montelukast  (SINGULAIR ) 10 MG tablet Take one tabl;et at bedtime for allergies, cough, wheeze, as needed   naproxen sodium (ALEVE) 220 MG tablet Take 220 mg by mouth daily as needed (pain).   OXYGEN  Inhale 2 L into the lungs at bedtime as needed.   potassium chloride  SA (KLOR-CON  M) 20 MEQ tablet Take 1 tablet (20 mEq total) by mouth daily.   No facility-administered encounter medications on file as of 02/19/2024.    Allergies (verified) Other and Promethazine hcl   History: Past Medical History:  Diagnosis Date   Bronchitis    Chronic back pain    Essential hypertension    Hyperlipidemia    IGT (impaired glucose tolerance) 2015   Obesity, unspecified    Osteoarthritis    Seasonal allergies    Past Surgical History:  Procedure Laterality Date   CARDIAC CATHETERIZATION N/A 10/25/2014   Procedure: Left Heart Cath and Coronary Angiography;  Surgeon: Lonni JONETTA Cash, MD;  Location: [facility] INVASIVE CV LAB;  Service: Cardiovascular;  Laterality: N/A;   COLONOSCOPY N/A 02/09/2016   Procedure: COLONOSCOPY;  Surgeon: Margo LITTIE Haddock, MD;  Location: [facility] ENDO SUITE;  Service: Endoscopy;  Laterality: N/A;  12:30 PM   JOINT REPLACEMENT N/A    Phreesia 07/27/2020   KNEE ARTHROSCOPY Left    TOTAL ABDOMINAL HYSTERECTOMY  1995 approx   Fibroids   TOTAL KNEE ARTHROPLASTY Right 2011   TOTAL KNEE ARTHROPLASTY Left 04/29/2017   Procedure: TOTAL KNEE ARTHROPLASTY;  Surgeon: Margrette Taft BRAVO, MD;  Location: [facility] ORS;  Service: Orthopedics;   Laterality: Left;   Family History  Problem Relation Age of Onset   Hypertension Mother    Stroke Mother 70       Deceased due CVa at 81   Diabetes Father    Cancer Father 49       Unknown, somewhere in stomach   Hypertension Brother    Hypertension Brother    Hypertension Brother    Cancer Maternal Grandfather        ?type   Hypertension Son    Social History   Socioeconomic History   Marital status: Married    Spouse name: Not on file   Number of children: Not on file   Years of education: Not on file   Highest education level: Not on file  Occupational History   Occupation: retired    Associate Professor: UNEMPLOYED  Tobacco Use   Smoking status: Never   Smokeless tobacco: Never  Vaping Use   Vaping status: Never Used  Substance and Sexual Activity   Alcohol use: No   Drug use: No   Sexual activity: Yes    Birth control/protection: Surgical  Other Topics Concern   Not on file  Social History Narrative   Not on file   Social Drivers of Health   Financial Resource Strain: Low Risk  (02/19/2024)   Overall Financial Resource Strain (CARDIA)    Difficulty of Paying Living Expenses: Not hard at all  Food Insecurity: No Food Insecurity (02/19/2024)   Hunger Vital Sign    Worried About Running Out of Food in the Last Year: Never true    Ran Out of Food in the Last Year: Never true  Transportation Needs: No Transportation Needs (02/19/2024)   PRAPARE - Administrator, Civil Service (Medical): No    Lack of Transportation (Non-Medical): No  Physical Activity: Sufficiently Active (02/19/2024)   Exercise Vital Sign    Days of Exercise per Week: 7 days    Minutes of Exercise per Session: 30 min  Stress: No Stress Concern Present (02/19/2024)   [employer] of Occupational Health - Occupational Stress Questionnaire    Feeling of Stress: Not at all  Social Connections: Socially Integrated (02/19/2024)   Social Connection and Isolation Panel    Frequency of  Communication with Friends and Family: More than three times a week    Frequency of Social Gatherings with Friends and Family: More than three times a week    Attends Religious Services: More than 4 times per year    Active Member of [employer] or Organizations: Yes    Attends Engineer, structural: More than 4 times per year    Marital Status: Married    Tobacco Counseling Counseling given: Yes   Clinical Intake: Pre-visit preparation completed: Yes Pain : No/denies pain   BMI - recorded: 42.25 Nutritional Status: BMI > 30  Obese Nutritional Risks: None Diabetes: No Lab Results  Component Value Date   HGBA1C 6.2 (H)  08/08/2023   HGBA1C 6.0 (H) 02/13/2023   HGBA1C 5.7 (H) 12/11/2021    How often do you need to have someone help you when you read instructions, pamphlets, or other written materials from your doctor or pharmacy?: 1 - Never Interpreter Needed?: No Information entered by :: Liem Copenhaver W CMA (AAMA)  Activities of Daily Living     02/19/2024    8:42 AM  In your present state of health, do you have any difficulty performing the following activities:  Hearing? 0  Vision? 0  Difficulty concentrating or making decisions? 0  Walking or climbing stairs? 0  Dressing or bathing? 0  Doing errands, shopping? 0  Preparing Food and eating ? N  Using the Toilet? N  In the past six months, have you accidently leaked urine? N  Do you have problems with loss of bowel control? N  Managing your Medications? N  Managing your Finances? N  Housekeeping or managing your Housekeeping? N   Patient Care Team: Antonetta Rollene BRAVO, MD as PCP - General Margrette Taft BRAVO, MD as Consulting Physician (Orthopedic Surgery) Darlean Ozell NOVAK, MD as Consulting Physician (Pulmonary Disease) Hope Almarie ORN, NP as Nurse Practitioner (Pulmonary Disease)   I have updated your Care Teams any recent Medical Services you may have received from other providers in the past year.      Assessment:   This is a routine wellness examination for Adalaide.  Hearing/Vision screen Hearing Screening - Comments:: Patient denies any hearing difficulties.   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 0 0 0 0 0  Number falls in past yr: 0 0 0 0 0  Injury with Fall? 0 0 0 0 0  Risk for fall due to : Impaired balance/gait;Impaired mobility   No Fall Risks No Fall Risks  Follow up Falls evaluation completed;Education provided;Falls prevention discussed Falls evaluation completed Falls evaluation completed Falls evaluation completed Falls evaluation completed    MEDICARE RISK AT HOME:  Medicare Risk at Home If so, are there any without handrails?: No Home free of loose throw rugs in walkways, pet beds, electrical cords, etc?: Yes Adequate lighting in your home to reduce risk of falls?: Yes Life alert?: No Use of a cane, walker or w/c?: Yes Grab bars in the bathroom?: Yes Shower chair or bench in shower?: No Elevated toilet seat  or a handicapped toilet?: Yes  TIMED UP AND GO: Was the test performed?  No  Cognitive Function: 6CIT completed    10/01/2021    1:37 PM  MMSE - Mini Mental State Exam  Not completed: Unable to complete        02/19/2024    8:42 AM 10/03/2022    1:32 PM 10/01/2021    1:37 PM  09/03/2018   10:21 AM 09/01/2017    3:08 PM  6CIT Screen  What Year? 0 points 0 points 0 points 0 points 0 points  What month? 0 points 0 points 0 points 0 points 0 points  What time? 0 points 0 points 0 points 0 points 0 points  Count back from 20 0 points 0 points 0 points 0 points 0 points  Months in reverse 0 points 0 points 0 points 0 points 0 points  Repeat phrase 0 points 0 points 0 points 2 points 0 points  Total Score 0 points 0 points 0 points 2 points 0 points    Immunizations Immunization History  Administered Date(s) Administered   Fluad Quad(high Dose 65+) 03/10/2019, 03/02/2021, 05/10/2022   Fluad Trivalent(High Dose 65+) 02/12/2023   INFLUENZA, HIGH DOSE SEASONAL PF 05/05/2018   Influenza,inj,Quad PF,6+ Mos 06/22/2013, 07/18/2014, 07/04/2015, 03/21/2016, 02/11/2017, 01/19/2020   Moderna Sars-Covid-2 Vaccination 06/19/2019, 07/19/2019, 04/13/2020   PPD Test 01/15/2018   Pneumococcal Conjugate-13 12/27/2013   Pneumococcal Polysaccharide-23 12/15/2012    Screening Tests Health Maintenance  Topic Date Due   DEXA SCAN  07/09/2020   Influenza Vaccine  12/26/2023   COVID-19 Vaccine (4 - 2025-26 season) 01/26/2024   DTaP/Tdap/Td (1 - Tdap) 10/21/2024 (Originally 08/12/1964)   Zoster Vaccines- Shingrix (1 of 2) 01/04/2026 (Originally 08/11/1997)   Mammogram  07/13/2024   Medicare Annual Wellness (AWV)  02/18/2025   Pneumococcal Vaccine: 50+ Years  Completed   Hepatitis C Screening  Completed   HPV VACCINES  Aged Out   Meningococcal B Vaccine  Aged Out   Colonoscopy  Discontinued    Health Maintenance Health Maintenance Due  Topic Date Due   DEXA SCAN  07/09/2020   Influenza Vaccine  12/26/2023   COVID-19 Vaccine (4 - 2025-26 season) 01/26/2024   Health Maintenance Items Addressed: DEXA ordered  Additional Screening: Vision Screening: Recommended annual ophthalmology exams for early detection of glaucoma and other disorders of the eye. Would you like a referral  to an eye doctor? No    Dental Screening: Recommended annual dental exams for proper oral hygiene  Community Resource Referral / Chronic Care Management: CRR required this visit?  No   CCM required this visit?  No  Plan:   I have personally reviewed and noted the following in the patient's chart:   Medical and social history Use of alcohol, tobacco or illicit drugs  Current medications and supplements including opioid prescriptions. Patient is not currently taking opioid prescriptions. Functional ability and status Nutritional status Physical activity Advanced directives List of other physicians Hospitalizations, surgeries, and ER visits in previous 12 months Vitals Screenings to include cognitive, depression, and falls Referrals and appointments  In addition, I have reviewed and discussed with patient certain preventive protocols, quality metrics, and best practice recommendations. A written personalized care plan for preventive services as well as general preventive health recommendations were provided to patient.   Sharda Keddy, CMA   02/19/2024   After Visit Summary: (MyChart) Due to this being a telephonic visit, the after visit summary with patients personalized plan was offered to  patient via MyChart   Notes: Nothing significant to report at this time.

## 2024-02-20 ENCOUNTER — Telehealth: Payer: Self-pay | Admitting: *Deleted

## 2024-02-20 DIAGNOSIS — G4733 Obstructive sleep apnea (adult) (pediatric): Secondary | ICD-10-CM

## 2024-02-20 NOTE — Telephone Encounter (Signed)
-----   Message from [facility] Abby W sent at 02/19/2024  8:57 AM EDT ----- Regarding: cpap machine Hi,  Patient completed her AWVS with me this morning. Her cpap machine broke and she is currently using a loaner. She said she was due for a new machine in August. Please contact patient about an order.   Many thanks  Stefano ORN, CMA  [facility] AWV Team Direct Dial: [phone]

## 2024-02-20 NOTE — Telephone Encounter (Signed)
 New CPAP was ordered her her in May 2025   I called pt to discuss, and there was no answer- LMTCB.

## 2024-02-20 NOTE — Telephone Encounter (Signed)
 Attempted to speak with patient to see if either she was available now to complete her AWV or if I could reschedule it. No answer. Left vm with office number to return call.

## 2024-02-23 ENCOUNTER — Telehealth: Payer: Self-pay | Admitting: *Deleted

## 2024-02-23 DIAGNOSIS — G4733 Obstructive sleep apnea (adult) (pediatric): Secondary | ICD-10-CM

## 2024-02-23 NOTE — Telephone Encounter (Signed)
 Spoke with the pt  She states needing new order placed for PAP  The one we placed in May was closed due to not being eligible for a new machine until Aug 2025  New order now placed  Nothing further needed

## 2024-02-23 NOTE — Telephone Encounter (Signed)
 Copied from CRM #[id number]. Topic: Clinical - Order For Equipment >> Feb 20, 2024  3:56 PM Devaughn RAMAN wrote: Reason for CRM: Patient returning [place] phone call regarding CPAP. Office currently closed, advised patient a follow up call would be made. Patient was thankful and verbalized understanding.  duplicate

## 2024-02-24 ENCOUNTER — Ambulatory Visit: Admitting: Family Medicine

## 2024-03-04 ENCOUNTER — Other Ambulatory Visit (HOSPITAL_COMMUNITY)

## 2024-03-08 NOTE — Telephone Encounter (Signed)
 Needs repeat HST in order to get replacement CPAP machine, last seen in May

## 2024-03-08 NOTE — Addendum Note (Signed)
 Addended by: HOPE ALMARIE ORN on: 03/08/2024 11:48 AM   Modules accepted: Orders

## 2024-05-12 ENCOUNTER — Ambulatory Visit: Admitting: Nurse Practitioner

## 2024-05-12 ENCOUNTER — Encounter: Payer: Self-pay | Admitting: Nurse Practitioner

## 2024-05-12 VITALS — BP 131/73 | HR 92 | Ht 62.0 in | Wt 230.0 lb

## 2024-05-12 DIAGNOSIS — J440 Chronic obstructive pulmonary disease with acute lower respiratory infection: Secondary | ICD-10-CM

## 2024-05-12 DIAGNOSIS — J209 Acute bronchitis, unspecified: Secondary | ICD-10-CM

## 2024-05-12 MED ORDER — PREDNISONE 20 MG PO TABS: 40.0000 mg | ORAL_TABLET | Freq: Every day | ORAL | 0 refills | Status: DC

## 2024-05-12 MED ORDER — AZITHROMYCIN 250 MG PO TABS: ORAL_TABLET | ORAL | 0 refills | Status: DC

## 2024-05-12 MED ORDER — PREDNISONE 20 MG PO TABS: 40.0000 mg | ORAL_TABLET | Freq: Every day | ORAL | 0 refills | Status: AC

## 2024-05-12 NOTE — Patient Instructions (Signed)
 1. Take meds as prescribed 2. Use a cool mist humidifier especially during the winter months and when heat has been humid. 3. Use saline nose sprays frequently 4. Saline irrigations of the nose can be very helpful if done frequently.  * 4X daily for 1 week*  * Use of a nettie pot can be helpful with this. Follow directions with this* 5. Drink plenty of fluids 6. Keep thermostat turn down low 7.For any cough or congestion- delsym OTC 8. For fever or aces or pains- take tylenol or ibuprofen appropriate for age and weight.  * for fevers greater than 101 orally you may alternate ibuprofen and tylenol every  3 hours.

## 2024-05-12 NOTE — Progress Notes (Signed)
 Subjective:    Patient ID: Patricia Fuentes, female    DOB: Dec 19, 1945, 78 y.o.   MRN: [id number]   Chief Complaint: URI (Wheezing, coughing, drainage, clear mucus, sore throat for three days)   Cough This is a new problem. The current episode started in the past 7 days. The problem has been gradually worsening. The problem occurs every few minutes. The cough is Productive of sputum. Associated symptoms include rhinorrhea and a sore throat (scratchy). Pertinent negatives include no chills, ear congestion, ear pain, fever or shortness of breath. Nothing aggravates the symptoms. Treatments tried: advil. The treatment provided mild relief. Her past medical history is significant for COPD.    Patient Active Problem List   Diagnosis Date Noted   Cough 05/16/2023   Nocturnal hypoxia 02/13/2023   Chronic cough 10/31/2022   Enlarged pulmonary artery (HCC) 10/30/2022   Chronic respiratory failure (HCC) 10/07/2022   Encounter for Medicare annual examination with abnormal findings 05/12/2022   Allergic cough 07/15/2021   Neck pain on right side 07/11/2021   Right shoulder pain 07/11/2021   Right hand pain 07/11/2021   Class 3 severe obesity due to excess calories with serious comorbidity and body mass index (BMI) of 40.0 to 44.9 in adult (HCC) 12/15/2018   Supplemental oxygen  dependent 12/15/2018   Sleep-related hypoxia 12/15/2018   Obesity with alveolar hypoventilation and body mass index (BMI) of 40 or greater (HCC) 09/17/2018   Insomnia due to medical condition 09/17/2018   Hypersomnia with sleep apnea 09/17/2018   History of orthopnea 09/17/2018   OSA on CPAP    Reactive airway disease    Hypokalemia 06/08/2018   S/P total knee arthroplasty, left 04/29/17 05/06/2017   Anemia 05/06/2017   Primary osteoarthritis of both knees    Abnormal CT scan, colon 01/18/2016   DOE (dyspnea on exertion)    Abnormal myocardial perfusion study    Fatigue 07/18/2014   Vitamin D  deficiency  07/18/2014   Exercise intolerance 07/18/2014   Sleep disorder 07/18/2014   Nonspecific abnormal electrocardiogram (ECG) (EKG) 07/18/2014   Reduced vision 12/27/2013   Radicular pain of thoracic region 06/22/2013   Prediabetes 10/20/2012   Metabolic syndrome X 10/20/2012   BACK PAIN WITH RADICULOPATHY 09/20/2008   Hyperlipidemia LDL goal <100 03/29/2008   Obesity hypoventilation syndrome (HCC) 03/29/2008   Essential hypertension 10/07/2007       Review of Systems  Constitutional:  Negative for chills and fever.  HENT:  Positive for congestion, rhinorrhea and sore throat (scratchy). Negative for ear pain.   Respiratory:  Positive for cough. Negative for shortness of breath.        Objective:   Physical Exam Constitutional:      Appearance: Normal appearance. She is obese.  HENT:     Right Ear: Tympanic membrane normal.     Left Ear: Tympanic membrane normal.     Nose: Congestion and rhinorrhea present.     Mouth/Throat:     Mouth: Mucous membranes are moist.  Cardiovascular:     Rate and Rhythm: Normal rate and regular rhythm.     Heart sounds: Normal heart sounds.  Pulmonary:     Effort: Pulmonary effort is normal.     Breath sounds: Wheezing (very faint exp wheezes bil bases) present.  Skin:    General: Skin is warm.  Neurological:     General: No focal deficit present.     Mental Status: She is alert and oriented to person, place, and time.  Psychiatric:  Mood and Affect: Mood normal.        Behavior: Behavior normal.    BP 131/73   Pulse 92   Ht 5' 2 (1.575 m)   Wt 230 lb (104.3 kg)   SpO2 91%   BMI 42.07 kg/m         Assessment & Plan:    Patricia Fuentes in today with chief complaint of URI (Wheezing, coughing, drainage, clear mucus, sore throat for three days)   1. Acute bronchitis with COPD (HCC) (Primary) 1. Take meds as prescribed 2. Use a cool mist humidifier especially during the winter months and when heat has been humid. 3.  Use saline nose sprays frequently 4. Saline irrigations of the nose can be very helpful if done frequently.  * 4X daily for 1 week*  * Use of a nettie pot can be helpful with this. Follow directions with this* 5. Drink plenty of fluids 6. Keep thermostat turn down low 7.For any cough or congestion- delsym  8. For fever or aces or pains- take tylenol  or ibuprofen appropriate for age and weight.  * for fevers greater than 101 orally you may alternate ibuprofen and tylenol  every  3 hours.    - azithromycin  (ZITHROMAX  Z-PAK) 250 MG tablet; As directed  Dispense: 6 tablet; Refill: 0 - predniSONE  (DELTASONE ) 20 MG tablet; Take 2 tablets (40 mg total) by mouth daily with breakfast for 5 days. 2 po daily for 5 days  Dispense: 10 tablet; Refill: 0    The above assessment and management plan was discussed with the patient. The patient verbalized understanding of and has agreed to the management plan. Patient is aware to call the clinic if symptoms persist or worsen. Patient is aware when to return to the clinic for a follow-up visit. Patient educated on when it is appropriate to go to the emergency department.   Mary-Margaret Gladis, FNP

## 2024-05-18 ENCOUNTER — Telehealth: Payer: Self-pay

## 2024-05-18 ENCOUNTER — Telehealth: Payer: Self-pay | Admitting: Pharmacy Technician

## 2024-05-18 ENCOUNTER — Other Ambulatory Visit (HOSPITAL_COMMUNITY): Payer: Self-pay

## 2024-05-18 ENCOUNTER — Ambulatory Visit: Payer: Self-pay | Admitting: Internal Medicine

## 2024-05-18 NOTE — Telephone Encounter (Signed)
 error

## 2024-05-18 NOTE — Telephone Encounter (Signed)
 You have not prescribe pt any medication and requesting a inhaler - states she has not needed one in a year and experiencing (was given antibiotics for bronchitis, pt is still coughing and is now beginning to wheeze, hasn't used inhaler for over a year )  Would you like me to schedule pt with you ?

## 2024-05-18 NOTE — Telephone Encounter (Signed)
 Pharmacy Patient Advocate Encounter  Received notification from [employer] that Prior Authorization for Zepbound  2.5MG /0.5ML pen-injectors has been APPROVED from 05/18/2024 to 05/26/2025. Ran test claim, Copay is $597.17. This test claim was processed through [facility]- copay amounts may vary at other pharmacies due to pharmacy/plan contracts, or as the patient moves through the different stages of their insurance plan.   PA #/Case ID/Reference #: [id number]  Chart reflects she never started therapy with the requested medication. She does meet the criteria given that she has OSA. I went ahead and requested the product again. I assume she has not filed due to cost. Medication is approved until 05/26/2025 just incase she reconsiders.

## 2024-05-18 NOTE — Telephone Encounter (Signed)
 FYI Only or Action Required?: Action required by provider: update on patient condition and requesting inhaler refill. Pharmacy confirmed  Patient is followed in Pulmonology for COPD/OSA, last seen on 10/08/2023 by Hope Almarie ORN, NP.  Called Nurse Triage reporting Cough.  Symptoms began several weeks ago.  Interventions attempted: OTC medications: tussin, Prescription medications: zpack, prednisone , and Home oxygen  use.  Symptoms are: gradually improving.  Triage Disposition: See HCP Within 4 Hours (Or PCP Triage)  Patient/caregiver understands and will follow disposition?: Yes  E2C2 Pulmonary Triage - Initial Assessment Questions Chief Complaint (e.g., cough, sob, wheezing, fever, chills, sweat or additional symptoms) *Go to specific symptom protocol after initial questions. Cough, wheezing  How long have symptoms been present? 2 weeks   Have you tested for COVID or Flu? Note: If not, ask patient if a home test can be taken. If so, instruct patient to call back for positive results. No  MEDICINES:   Have you used any OTC meds to help with symptoms? Yes If yes, ask What medications? Tussin   Have you used your inhalers/maintenance medication? No If yes, What medications? Lost hers- has been over a year   If inhaler, ask How many puffs and how often? Note: Review instructions on medication in the chart. Lost it   OXYGEN : Do you wear supplemental oxygen ? Yes If yes, How many liters are you supposed to use? 2L at night   Do you monitor your oxygen  levels? Yes If yes, What is your reading (oxygen  level) today? Low side 91% but able to recover well   What is your usual oxygen  saturation reading?  (Note: Pulmonary O2 sats should be 90% or greater) 95-98%   Copied from CRM #[id number]. Topic: Clinical - Red Word Triage >> May 18, 2024 10:33 AM Ismael A wrote: Red Word that prompted transfer to Nurse Triage: was given antibiotics for bronchitis, pt  is still coughing and is now beginning to wheeze, hasn't used inhaler for over a year Reason for Disposition  Wheezing is present  Answer Assessment - Initial Assessment Questions Cough, no energy Light headed- when first standing up and beginning to walk  water Z-pack, prednisone - not fully recovered  Chest congestion- tussin  Lost inhaler   1. ONSET: When did the cough begin?      *No Answer* 2. SEVERITY: How bad is the cough today?      *No Answer* 3. SPUTUM: Describe the color of your sputum (e.g., none, dry cough; clear, white, yellow, green)     Thick- white  4. HEMOPTYSIS: Are you coughing up any blood? If Yes, ask: How much? (e.g., flecks, streaks, tablespoons, etc.)     Denies  5. DIFFICULTY BREATHING: Are you having difficulty breathing? If Yes, ask: How bad is it? (e.g., mild, moderate, severe)      denies 6. FEVER: Do you have a fever? If Yes, ask: What is your temperature, how was it measured, and when did it start?     *No Answer* 7. CARDIAC HISTORY: Do you have any history of heart disease? (e.g., heart attack, congestive heart failure)      HTN 8. LUNG HISTORY: Do you have any history of lung disease?  (e.g., pulmonary embolus, asthma, emphysema)     *No Answer* 9. PE RISK FACTORS: Do you have a history of blood clots? (or: recent major surgery, recent prolonged travel, bedridden)     *No Answer* 10. OTHER SYMPTOMS: Do you have any other symptoms? (e.g., runny nose, wheezing, chest pain)       *  No Answer*  Protocols used: Cough - Acute Productive-A-AH

## 2024-05-18 NOTE — Telephone Encounter (Signed)
 Pt needs f/u for her symptoms and refills with Hope or Wert

## 2024-05-18 NOTE — Telephone Encounter (Signed)
 Pharmacy Patient Advocate Encounter   Received notification from [employer] that prior authorization for Zepbound  2.5MG /0.5ML pen-injectors is due for renewal.   Insurance verification completed.   The patient is insured through [employer].  Action: PA required; PA submitted to above mentioned insurance via Latent Key/confirmation #/EOC [id number] Status is pending

## 2024-05-18 NOTE — Telephone Encounter (Signed)
 Yes need visit with myself or Dr. Darlean for refills

## 2024-05-19 NOTE — Telephone Encounter (Signed)
 Attempted to call patient, no answer so I left a voicemail.

## 2024-05-24 NOTE — Telephone Encounter (Signed)
 Copied from CRM [id number]. Topic: Clinical - Red Word Triage >> May 18, 2024 10:33 AM Ismael A wrote: Red Word that prompted transfer to Nurse Triage: was given antibiotics for bronchitis, pt is still coughing and is now beginning to wheeze, hasn't used inhaler for over a year >> May 19, 2024 11:56 AM Leila BROCKS wrote: Patient [phone] is returning the office call, regarding medications refills from Dr. Darlean, and to schedule unsure if it's for a specific time. Unable to reach CAL, tried a few times. Patient states she wants to wait for a call back prior to scheduling. Please call back.  05/24/24 called and spoke with patient ---scheduled to see Dr. Darlean 05/25/24 at 9:45 am---patient voiced her understanding

## 2024-05-25 ENCOUNTER — Ambulatory Visit: Admitting: Internal Medicine

## 2024-05-25 ENCOUNTER — Encounter: Payer: Self-pay | Admitting: Internal Medicine

## 2024-05-25 VITALS — BP 114/68 | HR 84 | Ht 62.0 in | Wt 228.0 lb

## 2024-05-25 DIAGNOSIS — R0609 Other forms of dyspnea: Secondary | ICD-10-CM

## 2024-05-25 DIAGNOSIS — E662 Morbid (severe) obesity with alveolar hypoventilation: Secondary | ICD-10-CM

## 2024-05-25 DIAGNOSIS — Z6841 Body Mass Index (BMI) 40.0 and over, adult: Secondary | ICD-10-CM

## 2024-05-25 MED ORDER — ALBUTEROL SULFATE HFA 108 (90 BASE) MCG/ACT IN AERS: INHALATION_SPRAY | RESPIRATORY_TRACT | 11 refills | Status: AC

## 2024-05-25 NOTE — Patient Instructions (Addendum)
 Use your albuterol  as a rescue medication to be used if you can't catch your breath by resting, slowing your pace,  or doing a relaxed purse lip breathing pattern.  - The less you use it, the better it will work when you need it. - Ok to use up to 2 puffs  every 4 hours if you must but call for  appointment if use goes up over your usual need - Don't leave home without it !!  (think of it like a spare tire or starter fluid for your car)    Also  Ok to try albuterol  15 min before an activity (on alternating days)  that you know would usually make you short of breath and see if it makes any difference and if makes none then don't take albuterol  after activity unless you can't catch your breath as this means it's the resting that helps, not the albuterol .  If needing albuterol  more than twice a week consistently please call for appt and bring inhaler with you   Please schedule a follow up visit in 3 months but call sooner if needed - bring inhaler with you

## 2024-05-25 NOTE — Progress Notes (Unsigned)
 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  Patient discharged home with instruction to follow-up with PCP in 10 days.  Arrangement has been made to provide oxygen  supplementation at discharge.    History of present illness:  As per H&P written by Dr. Tobie on 06/08/2018 78 y.o. female with medical history significant for HTN, HLD, and OA presents the ED with about 1 week of cough productive of white sputum.  She has had intermittent episodes of feeling woozy but denies any other associated symptoms including fevers, diaphoresis, dyspnea, chest pain, palpitations, abdominal pain, or dysuria.  She has noted some swelling in her right leg.  She does report snoring at night says that her husband has noticed that she has occasional episodes where she stops breathing while sleeping.  She also reports daytime fatigue requiring naps.  She was referred for a sleep study however rescheduled it.  She denies any personal history of lung disease including asthma or COPD.  She is a never smoker but says she has occasional exposure to secondhand smoke.   Hospital Course:  1-hypoxia: In the setting of obesity hypoventilation syndrome, obstructive sleep apnea and acute reactive airway from bronchitis. -Patient with good response to the use of oxygen  supplementation (2 L nasal cannula supplementation), initiation of antibiotics and the use of albuterol  nebulizer. -Patient advised on the importance for weight loss, outpatient sleep study evaluation with initiation on CPAP if required and also  depending clinical response evaluation with pulmonary function test. -Discharged on Zithromax  to complete a total of 5 days, Flonase  and also as needed albuterol  inhaler.     2-morbid obesity: With obstructive sleep apnea -Outpatient evaluation as mentioned above for sleep study and CPAP initiation. -Low calorie diet, portion control and increase physical activity to assist with weight loss. -Patient will benefit of referral to bariatric clinic to further assist with weight loss plans. -Body mass index is 45.73 kg/m.         History of Present Illness  11/11/2019  Pulmonary/ 1st office eval/Patricia Fuentes    Chief Complaint  Patient presents with   Pulmonary Consult    Referred by Dr Rollene Pesa. Former patient of Dr Vonzell. She is using CPAP with 2lpm o2 with sleep. No o2 during the day. Breathing is overall doing well. She rarely uses albuterol .   Dyspnea:  MMRC1 = can walk nl pace, flat grade, can't hurry or go uphills or steps s legs give out first  Cough: none Sleep: cpap bed flat and 2 pillows / feels like rested most days  SABA use: maybe once a week at most when overdoes it New R > L leg swelling x weeks or a month or so, admits to eating lots of potato chips, no calf pain  Rec All calls related to sleep and sleep equipment should go to Dr Chalice  Only use your albuterol  as a rescue medication  Try albuterol  15 min before an activity that you know would make you short of breath and see if it  makes any difference and if makes none then don't take it after activity unless you can't catch your breath. Avoid all salt and if the swelling doesn't improve see Dr Antonetta (? Due to amlopidine)  To get the most out of exercise, you need to be continuously aware that you are short of breath, but never out of breath, for 30 minutes daily.  Return if breathing worse or need more albuterol .   11/27/2022  Re-establish ov/[place] office/[name] re: enlarged PA  maint on no resp rx/ just 2lpm at  hs  Chief Complaint  Patient presents with   Consult  Not using cpap x 6 months - mask issues/ has not contacted prescriber or DME (Dr Dohmeier/[employer]) Dyspnea:  walks at [employer] slower than others/ variable 02 sats, fatigue is worse than doe  Cough: comes and goes x months no pattern  / variabilty min productive  Sleeping: on back bed is flat 2 big pillows wakes up with HA and tired in am/ mild hypersomnolence daytie SABA use: none   02: 02 2lpm hs only  Rec My office will be contacting you by phone for referral to Dr Chalice   - if you don't hear back from my office within one week please call us  back or notify us  thru MyChart and we'll address it right away.  In the meantime we will order you an overnight oxygen  level while you are on oxygen . > not done as of 09/01/2023    09/01/2023  f/u ov/Patricia Fuentes office/Brandolyn Shortridge re: MO/ OSA  maint on 2lpm hs  / singulair  only pulmonary med  Chief Complaint  Patient presents with   Shortness of Breath   Dyspnea:  walking on job/ [employer] slower pace than others = MMRC2 = can't walk a nl pace on a flat grade s sob but does fine slow and flat   Cough: none  Sleeping: bed is flat / 2 pillows   resp cc  SABA use: none  02: 2lpm at hs  My office will be contacting you by phone for referral to Dohmeier and see if we can get you the mask of choice (take it with you to see Dr D)  - if you don't hear back from my office within one week please call us  back or notify us  thru MyChart and we'll address it right away.   6-8 inch bed blocks We will walk you today to see if your qualify for portable 02  Pulmonary follow up is as needed .    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    Also denies any obvious fluctuation of symptoms with weather or environmental changes or other aggravating or alleviating factors except as outlined above   No unusual exposure hx or h/o childhood pna/ asthma or knowledge of premature birth.  Current Allergies, Complete Past Medical History, Past Surgical History, Family History, and Social History were reviewed  in [employer] record.  ROS  The following are not active complaints unless bolded Hoarseness, sore throat, dysphagia, dental problems, itching, sneezing,  nasal congestion or discharge of excess mucus or purulent secretions, ear ache,   fever, chills, sweats, unintended wt loss or wt gain, classically pleuritic or exertional cp,  orthopnea pnd or arm/hand swelling  or leg swelling, presyncope, palpitations, abdominal pain, anorexia, nausea, vomiting, diarrhea  or change in bowel habits or change in bladder habits, change in stools or change in urine, dysuria, hematuria,  rash, arthralgias, visual complaints, headache, numbness, weakness or ataxia or problems with walking or coordination,  change in mood or  memory.         Outpatient Medications Prior to Visit  Medication Sig Dispense Refill   amLODipine  (NORVASC ) 10 MG tablet Take 1 tablet (10 mg total) by mouth daily. 90 tablet 1   azithromycin  (ZITHROMAX  Z-PAK) 250 MG tablet As directed 6 tablet 0   Blood Pressure Monitoring (BLOOD PRESSURE MONITOR 3) DEVI Check blood pressure once daily 1 hour after taking blood pressure medication 1 each 0   Cholecalciferol  (VITAMIN D3) 50 MCG (2000 UT) capsule Take 1 capsule (2,000 Units total) by mouth daily. 90 capsule 3   furosemide  (LASIX ) 20 MG tablet Take 1 tablet (20 mg total) by mouth daily. 90  tablet 3   montelukast  (SINGULAIR ) 10 MG tablet Take one tabl;et at bedtime for allergies, cough, wheeze, as needed 30 tablet 2   naproxen sodium (ALEVE) 220 MG tablet Take 220 mg by mouth daily as needed (pain).     OXYGEN  Inhale 2 L into the lungs at bedtime as needed.     potassium chloride  SA (KLOR-CON  M) 20 MEQ tablet Take 1 tablet (20 mEq total) by mouth daily. 90 tablet 3   diclofenac  Sodium (VOLTAREN ) 1 % GEL Apply to neck 3 times weekly , as needed, for pain (Patient not taking: Reported on 05/25/2024) 350 g 1   No facility-administered medications prior to visit.             Past Medical History:  Diagnosis Date   Bronchitis    Chronic back pain    Essential hypertension    Hyperlipidemia    IGT (impaired glucose tolerance) 2015   Obesity, unspecified    Osteoarthritis    Seasonal allergies        Objective:    Wts  05/25/2024     228  09/01/2023         237  11/27/2022         234  11/11/19 245 lb (111.1 kg)  09/06/19 243 lb (110.2 kg)  03/10/19 243 lb (110.2 kg)    Vital signs reviewed  05/25/2024  - Note at rest 02 sats  97% on RA   General appearance:    Morbidly obese (by BMI) amb bf nad     HEENT : Oropharynx  clear      Nasal turbinates nl    NECK :  without  apparent JVD/ palpable Nodes/TM    LUNGS: no acc muscle use,  Nl contour chest which is clear to A and P bilaterally without cough on insp or exp maneuvers   CV:  RRR  no s3 or murmur or increase in P2, and no edema   ABD:  Obese soft and nontender   MS:  Gait nl   ext warm without deformities Or obvious joint restrictions  calf tenderness, cyanosis or clubbing  SKIN: warm and dry without lesions    NEURO:  alert, approp, nl sensorium with  no motor or cerebellar deficits apparent.         Assessment   Assessment & Plan DOE (dyspnea on exertion) Onset with wt gain ? year Echo 07/09/2018 :  diastolic dysfunction / mild RVE/   LA=RA= mild dilation / small PF0 L > R  - PFT's   11/11/2018  FEV1 1.26 (76 % ) ratio 0.77  p 0 % improvement from saba p ? prior to study with FV curve minimal concavity and ERV  7% at wt 235   Chest CTa   10/07/22    1. No evidence of pulmonary embolism or other acute findings in the chest. 2. Stable mild dilatation of the central pulmonary arteries   Echo 10/08/22  G1 diastolic dysfunction and nl RV with est RAP = 15 but RA nl size - 11/27/2022   Walked on RA  x  2  lap(s) =  approx 300  ft  @ mod pace, stopped due to legs tired with lowest 02 sats 96% and no sob   - 09/01/2023   Walked on RA  x  3  lap(s) =  approx 450  ft  @ moderate pace, stopped due to end of study  with lowest 02 sats 90% with mild sob    Not clear at all this is asthma as always uses saba p exertion when I would expect her to recover from doe with or without saba  Re SABA :  I spent extra time with pt today reviewing appropriate use of albuterol  for prn use on exertion with the following points: 1) saba is for relief of sob that does not improve by walking a slower pace or resting but rather if the pt does not improve after trying this first. 2) If the pt is convinced, as many are, that saba helps recover from activity faster then it's easy to tell if this is the case by re-challenging : ie stop, take the inhaler, then p 5 minutes try the exact same activity (intensity of workload) that just caused the symptoms and see if they are substantially diminished or not after saba 3) if there is an activity that reproducibly causes the symptoms, try the saba 15 min before the activity on alternate days   If in fact the saba really does help, then fine to continue to use it prn but advised may need to look closer at the maintenance regimen (here = singulair )  being used to achieve better control of airways disease with exertion.  >>> continue singulair  as maint rx for now unless showing reproducible improvement in DOE with saba in which case would add symbicort 80 2 pffs q am on a trial  basis    Obesity with alveolar hypoventilation and body mass index (BMI) of 40 or greater (HCC) HC03    08/08/23  = 31 (prev 32)   Body mass index is 41.7 kg/m.  -  trending down slightly, encouraged. Lab Results  Component Value Date   TSH 1.180 02/13/2023      Contributing to doe and OSA  and risk of GERD/dvt/ PE  >>>  reviewed the need and the process to achieve and maintain neg calorie balance > defer f/u primary care including intermittently monitoring thyroid  status     >>> f/u with Dohmeier planned          Each maintenance medication was reviewed in detail including  emphasizing most importantly the difference between maintenance and prns and under what circumstances the prns are to be triggered using an action plan format where appropriate.  Total time for H and P, chart review, counseling, reviewing hfa  device(s) and generating customized AVS unique to this office visit / same day charting = 25 min            AVS  Patient Instructions  Use your albuterol  as a rescue medication to be used if you can't catch your breath by resting, slowing your pace,  or doing a relaxed purse lip breathing pattern.  - The less you use it, the better it will work when you need it. - Ok to use up to 2 puffs  every 4 hours if you must but call for  appointment if use goes up over your usual need - Don't leave home without it !!  (think of it like a spare tire or starter fluid for your car)    Also  Ok to try albuterol  15 min before an activity (on alternating days)  that you know would usually make you short of breath and see if it makes any difference and if makes none then don't take albuterol  after activity unless you can't catch your breath as this means it's the resting that helps, not the albuterol .  If needing albuterol  more than twice a week consistently please call for appt and bring inhaler with you   Please schedule a follow up visit in 3 months but call sooner if needed -  bring inhaler with you       Ozell America, MD 05/27/2024          "

## 2024-05-27 NOTE — Assessment & Plan Note (Addendum)
 Onset with wt gain ? year Echo 07/09/2018 :  diastolic dysfunction / mild RVE/   LA=RA= mild dilation / small PF0 L > R  - PFT's  11/11/2018  FEV1 1.26 (76 % ) ratio 0.77  p 0 % improvement from saba p ? prior to study with FV curve minimal concavity and ERV  7% at wt 235   Chest CTa   10/07/22    1. No evidence of pulmonary embolism or other acute findings in the chest. 2. Stable mild dilatation of the central pulmonary arteries   Echo 10/08/22  G1 diastolic dysfunction and nl RV with est RAP = 15 but RA nl size - 11/27/2022   Walked on RA  x  2  lap(s) =  approx 300  ft  @ mod pace, stopped due to legs tired with lowest 02 sats 96% and no sob   - 09/01/2023   Walked on RA  x  3  lap(s) =  approx 450  ft  @ moderate pace, stopped due to end of study  with lowest 02 sats 90% with mild sob    Not clear at all this is asthma as always uses saba p exertion when I would expect her to recover from doe with or without saba  Re SABA :  I spent extra time with pt today reviewing appropriate use of albuterol  for prn use on exertion with the following points: 1) saba is for relief of sob that does not improve by walking a slower pace or resting but rather if the pt does not improve after trying this first. 2) If the pt is convinced, as many are, that saba helps recover from activity faster then it's easy to tell if this is the case by re-challenging : ie stop, take the inhaler, then p 5 minutes try the exact same activity (intensity of workload) that just caused the symptoms and see if they are substantially diminished or not after saba 3) if there is an activity that reproducibly causes the symptoms, try the saba 15 min before the activity on alternate days   If in fact the saba really does help, then fine to continue to use it prn but advised may need to look closer at the maintenance regimen (here = singulair )  being used to achieve better control of airways disease with exertion.  >>> continue singulair  as  maint rx for now unless showing reproducible improvement in DOE with saba in which case would add symbicort 80 2 pffs q am on a trial basis

## 2024-05-27 NOTE — Assessment & Plan Note (Addendum)
 HC03    08/08/23  = 31 (prev 32)   Body mass index is 41.7 kg/m.  -  trending down slightly, encouraged. Lab Results  Component Value Date   TSH 1.180 02/13/2023      Contributing to doe and OSA  and risk of GERD/dvt/ PE  >>>  reviewed the need and the process to achieve and maintain neg calorie balance > defer f/u primary care including intermittently monitoring thyroid  status     >>> f/u with Dohmeier planned          Each maintenance medication was reviewed in detail including emphasizing most importantly the difference between maintenance and prns and under what circumstances the prns are to be triggered using an action plan format where appropriate.  Total time for H and P, chart review, counseling, reviewing hfa  device(s) and generating customized AVS unique to this office visit / same day charting = 25 min

## 2024-06-10 ENCOUNTER — Encounter: Payer: Self-pay | Admitting: Family Medicine

## 2024-06-10 ENCOUNTER — Ambulatory Visit (INDEPENDENT_AMBULATORY_CARE_PROVIDER_SITE_OTHER): Admitting: Family Medicine

## 2024-06-10 VITALS — BP 133/75 | HR 75 | Resp 16 | Ht 62.0 in | Wt 226.1 lb

## 2024-06-10 DIAGNOSIS — M25511 Pain in right shoulder: Secondary | ICD-10-CM | POA: Diagnosis not present

## 2024-06-10 DIAGNOSIS — Z6841 Body Mass Index (BMI) 40.0 and over, adult: Secondary | ICD-10-CM | POA: Diagnosis not present

## 2024-06-10 DIAGNOSIS — E559 Vitamin D deficiency, unspecified: Secondary | ICD-10-CM

## 2024-06-10 DIAGNOSIS — E785 Hyperlipidemia, unspecified: Secondary | ICD-10-CM | POA: Diagnosis not present

## 2024-06-10 DIAGNOSIS — G4733 Obstructive sleep apnea (adult) (pediatric): Secondary | ICD-10-CM | POA: Diagnosis not present

## 2024-06-10 DIAGNOSIS — Z0001 Encounter for general adult medical examination with abnormal findings: Secondary | ICD-10-CM

## 2024-06-10 DIAGNOSIS — R058 Other specified cough: Secondary | ICD-10-CM | POA: Diagnosis not present

## 2024-06-10 DIAGNOSIS — G8929 Other chronic pain: Secondary | ICD-10-CM

## 2024-06-10 DIAGNOSIS — I10 Essential (primary) hypertension: Secondary | ICD-10-CM | POA: Diagnosis not present

## 2024-06-10 DIAGNOSIS — J40 Bronchitis, not specified as acute or chronic: Secondary | ICD-10-CM | POA: Diagnosis not present

## 2024-06-10 DIAGNOSIS — R7303 Prediabetes: Secondary | ICD-10-CM | POA: Diagnosis not present

## 2024-06-10 MED ORDER — BENZONATATE 100 MG PO CAPS: 100.0000 mg | ORAL_CAPSULE | Freq: Two times a day (BID) | ORAL | 0 refills | Status: AC | PRN

## 2024-06-10 MED ORDER — MONTELUKAST SODIUM 10 MG PO TABS: ORAL_TABLET | ORAL | 1 refills | Status: AC

## 2024-06-10 MED ORDER — PREDNISONE 5 MG PO TABS: 5.0000 mg | ORAL_TABLET | Freq: Two times a day (BID) | ORAL | 0 refills | Status: AC

## 2024-06-10 MED ORDER — DICLOFENAC SODIUM 1 % EX GEL: CUTANEOUS | 1 refills | Status: AC

## 2024-06-10 NOTE — Patient Instructions (Addendum)
 F/u end August   Please schedule mammogram nd bone density at checkout, different days per pt request  Labs today CBC, lipid, cmp and EGFR, HBA1C, TSH and vit D today  Please contact pulmonary so that you can get your sleep apnea treated  It is important that you exercise regularly at least 30 minutes 5 times a week. If you develop chest pain, have severe difficulty breathing, or feel very tired, stop exercising immediately and seek medical attention    Think about what you will eat, plan ahead. Choose  clean, green, fresh or frozen over canned, processed or packaged foods which are more sugary, salty and fatty. 70 to 75% of food eaten should be vegetables and fruit. Three meals at set times with snacks allowed between meals, but they must be fruit or vegetables. Aim to eat over a 12 hour period , example 7 am to 7 pm, and STOP after  your last meal of the day. Drink water,generally about 64 ounces per day, no other drink is as healthy. Fruit juice is best enjoyed in a healthy way, by EATING the fruit.  Thanks for choosing [facility], we consider it a privelige to serve you.

## 2024-06-10 NOTE — Assessment & Plan Note (Signed)
" °  Patient re-educated about  the importance of commitment to a  minimum of 150 minutes of exercise per week as able.  The importance of healthy food choices with portion control discussed, as well as eating regularly and within a 12 hour window most days. The need to choose clean , green food 50 to 75% of the time is discussed, as well as to make water the primary drink and set a goal of 64 ounces water daily.       06/10/2024    2:53 PM 05/25/2024    9:37 AM 05/12/2024    1:06 PM  Weight /BMI  Weight 226 lb 1.3 oz 228 lb 230 lb  Height 5' 2 (1.575 m) 5' 2 (1.575 m) 5' 2 (1.575 m)  BMI 41.35 kg/m2 41.7 kg/m2 42.07 kg/m2    Improved, encouraged to continue in this direction "

## 2024-06-10 NOTE — Progress Notes (Signed)
" ° ° ° ° °  Patricia Fuentes     MRN: [id number]      DOB: 04/21/1946  Chief Complaint  Patient presents with   Annual Exam    HPI: Patient is in for annual physical exam. C/o right shoulder pain worse in past 5 6 months with limitation in mobility 1 month h/o cough and chest congestion, sputum is thick and white  Immunization is reviewed , and  updated if needed.   PE: BP 133/75   Pulse 75   Resp 16   Ht 5' 2 (1.575 m)   Wt 226 lb 1.3 oz (102.5 kg)   SpO2 90%   BMI 41.35 kg/m   Pleasant  female, alert and oriented x 3, in no cardio-pulmonary distress. Afebrile. HEENT No facial trauma or asymetry. Sinuses non tender.  Extra occullar muscles intact.. External ears normal, . Neck: supple, no adenopathy,JVD or thyromegaly.No bruits.  Chest: Bibasilar  crackles no  wheezes.Decreased air entry Non tender to palpation    Cardiovascular system; Heart sounds normal,  S1 and  S2 ,no S3.  No murmur, or thrill.  Peripheral pulses normal.  Abdomen: Soft, non tender, no organomegaly or masses.  Musculoskeletal exam: Decreased  ROM of spine, hips , and knees.markedly reduced in right shoulder  deformity ,swelling and  crepitus noted. No muscle wasting or atrophy.   Neurologic: Cranial nerves 2 to 12 intact. Power, tone ,sensation  normal throughout.  disturbance in gait. No tremor.  Skin: Intact, no ulceration, erythema , scaling or rash noted. Pigmentation normal throughout  Psych; Normal mood and affect. Judgement and concentration normal   Assessment & Plan:  Shoulder pain, right Worse in 6weeks refer Ortho  Encounter for Medicare annual examination with abnormal findings Annual exam as documented. Counseling done  re healthy lifestyle involving commitment to 150 minutes exercise per week, heart healthy diet, and attaining healthy weight.The importance of adequate sleep also discussed. Regular seat belt use and home safety, is also discussed. Changes in  health habits are decided on by the patient with goals and time frames  set for achieving them. Immunization and cancer screening needs are specifically addressed at this visit.   Allergic cough Uncontrolled resume montelukast   Bronchitis 1 month h/o productive cough, despite antibiotic cXR, tessalon  perles, pred 5 mg twice daily for 5 days  OSA on CPAP Needs to contact Pulmonary to get treatment resumed  Morbid obesity (HCC)  Patient re-educated about  the importance of commitment to a  minimum of 150 minutes of exercise per week as able.  The importance of healthy food choices with portion control discussed, as well as eating regularly and within a 12 hour window most days. The need to choose clean , green food 50 to 75% of the time is discussed, as well as to make water the primary drink and set a goal of 64 ounces water daily.       06/10/2024    2:53 PM 05/25/2024    9:37 AM 05/12/2024    1:06 PM  Weight /BMI  Weight 226 lb 1.3 oz 228 lb 230 lb  Height 5' 2 (1.575 m) 5' 2 (1.575 m) 5' 2 (1.575 m)  BMI 41.35 kg/m2 41.7 kg/m2 42.07 kg/m2    Improved, encouraged to continue in this direction  "

## 2024-06-10 NOTE — Assessment & Plan Note (Signed)
 1 month h/o productive cough, despite antibiotic cXR, tessalon  perles, pred 5 mg twice daily for 5 days

## 2024-06-10 NOTE — Assessment & Plan Note (Signed)
 Uncontrolled resume montelukast 

## 2024-06-10 NOTE — Assessment & Plan Note (Signed)
 Needs to contact Pulmonary to get treatment resumed

## 2024-06-10 NOTE — Assessment & Plan Note (Signed)

## 2024-06-10 NOTE — Assessment & Plan Note (Signed)
 Worse in 6weeks refer Ortho

## 2024-06-11 ENCOUNTER — Ambulatory Visit: Payer: Self-pay | Admitting: Family Medicine

## 2024-06-11 ENCOUNTER — Other Ambulatory Visit (HOSPITAL_COMMUNITY): Payer: Self-pay | Admitting: Family Medicine

## 2024-06-11 ENCOUNTER — Ambulatory Visit: Admitting: Family Medicine

## 2024-06-11 DIAGNOSIS — Z1231 Encounter for screening mammogram for malignant neoplasm of breast: Secondary | ICD-10-CM

## 2024-06-11 LAB — CMP14+EGFR
ALT: 7 IU/L (ref 0–32)
AST: 9 IU/L (ref 0–40)
Albumin: 4.1 g/dL (ref 3.8–4.8)
Alkaline Phosphatase: 75 IU/L (ref 49–135)
BUN/Creatinine Ratio: 13 (ref 12–28)
BUN: 9 mg/dL (ref 8–27)
Bilirubin Total: 0.3 mg/dL (ref 0.0–1.2)
CO2: 28 mmol/L (ref 20–29)
Calcium: 9 mg/dL (ref 8.7–10.3)
Chloride: 99 mmol/L (ref 96–106)
Creatinine, Ser: 0.68 mg/dL (ref 0.57–1.00)
Globulin, Total: 3.2 g/dL (ref 1.5–4.5)
Glucose: 86 mg/dL (ref 70–99)
Potassium: 4.1 mmol/L (ref 3.5–5.2)
Sodium: 141 mmol/L (ref 134–144)
Total Protein: 7.3 g/dL (ref 6.0–8.5)
eGFR: 90 mL/min/1.73

## 2024-06-11 LAB — VITAMIN D 25 HYDROXY (VIT D DEFICIENCY, FRACTURES): Vit D, 25-Hydroxy: 7.2 ng/mL — ABNORMAL LOW (ref 30.0–100.0)

## 2024-06-11 LAB — CBC WITH DIFFERENTIAL/PLATELET
Basophils Absolute: 0 x10E3/uL (ref 0.0–0.2)
Basos: 1 %
EOS (ABSOLUTE): 0.3 x10E3/uL (ref 0.0–0.4)
Eos: 6 %
Hematocrit: 36.4 % (ref 34.0–46.6)
Hemoglobin: 11.9 g/dL (ref 11.1–15.9)
Immature Grans (Abs): 0 x10E3/uL (ref 0.0–0.1)
Immature Granulocytes: 0 %
Lymphocytes Absolute: 1.9 x10E3/uL (ref 0.7–3.1)
Lymphs: 35 %
MCH: 29 pg (ref 26.6–33.0)
MCHC: 32.7 g/dL (ref 31.5–35.7)
MCV: 89 fL (ref 79–97)
Monocytes Absolute: 0.6 x10E3/uL (ref 0.1–0.9)
Monocytes: 10 %
Neutrophils Absolute: 2.6 x10E3/uL (ref 1.4–7.0)
Neutrophils: 48 %
Platelets: 206 x10E3/uL (ref 150–450)
RBC: 4.1 x10E6/uL (ref 3.77–5.28)
RDW: 12.7 % (ref 11.7–15.4)
WBC: 5.4 x10E3/uL (ref 3.4–10.8)

## 2024-06-11 LAB — LIPID PANEL
Chol/HDL Ratio: 3.3 ratio (ref 0.0–4.4)
Cholesterol, Total: 187 mg/dL (ref 100–199)
HDL: 57 mg/dL
LDL Chol Calc (NIH): 115 mg/dL — ABNORMAL HIGH (ref 0–99)
Triglycerides: 84 mg/dL (ref 0–149)
VLDL Cholesterol Cal: 15 mg/dL (ref 5–40)

## 2024-06-11 LAB — TSH: TSH: 1.25 u[IU]/mL (ref 0.450–4.500)

## 2024-06-11 LAB — HEMOGLOBIN A1C
Est. average glucose Bld gHb Est-mCnc: 120 mg/dL
Hgb A1c MFr Bld: 5.8 % — ABNORMAL HIGH (ref 4.8–5.6)

## 2024-06-11 MED ORDER — VITAMIN D (ERGOCALCIFEROL) 1.25 MG (50000 UNIT) PO CAPS: 50000.0000 [IU] | ORAL_CAPSULE | ORAL | 8 refills | Status: AC

## 2024-06-22 ENCOUNTER — Encounter: Admitting: Orthopedic Surgery

## 2024-06-22 ENCOUNTER — Ambulatory Visit: Payer: Self-pay | Admitting: Family Medicine

## 2024-06-25 ENCOUNTER — Other Ambulatory Visit (HOSPITAL_COMMUNITY)

## 2024-07-13 ENCOUNTER — Ambulatory Visit: Admitting: Orthopedic Surgery

## 2024-07-14 ENCOUNTER — Ambulatory Visit (HOSPITAL_COMMUNITY)

## 2024-07-14 ENCOUNTER — Ambulatory Visit: Admitting: Orthopedic Surgery

## 2024-08-16 ENCOUNTER — Ambulatory Visit: Admitting: Internal Medicine

## 2025-01-07 ENCOUNTER — Ambulatory Visit: Payer: Self-pay | Admitting: Family Medicine

## 2025-02-22 ENCOUNTER — Ambulatory Visit
